# Patient Record
Sex: Male | Born: 1940
Health system: Southern US, Community
[De-identification: ages and names within clinical notes are randomized; demographics above are authoritative.]

## PROBLEM LIST (undated history)

## (undated) DIAGNOSIS — Z8639 Personal history of other endocrine, nutritional and metabolic disease: Secondary | ICD-10-CM

## (undated) DIAGNOSIS — F419 Anxiety disorder, unspecified: Secondary | ICD-10-CM

## (undated) DIAGNOSIS — M1990 Unspecified osteoarthritis, unspecified site: Secondary | ICD-10-CM

## (undated) DIAGNOSIS — N529 Male erectile dysfunction, unspecified: Secondary | ICD-10-CM

## (undated) DIAGNOSIS — I10 Essential (primary) hypertension: Secondary | ICD-10-CM

## (undated) DIAGNOSIS — K219 Gastro-esophageal reflux disease without esophagitis: Secondary | ICD-10-CM

## (undated) DIAGNOSIS — M7500 Adhesive capsulitis of unspecified shoulder: Secondary | ICD-10-CM

## (undated) DIAGNOSIS — M4800 Spinal stenosis, site unspecified: Secondary | ICD-10-CM

## (undated) DIAGNOSIS — I2510 Atherosclerotic heart disease of native coronary artery without angina pectoris: Secondary | ICD-10-CM

## (undated) HISTORY — DX: Adhesive capsulitis of unspecified shoulder: M75.00

## (undated) HISTORY — PX: CARDIAC SURGERY: SHX584

## (undated) HISTORY — DX: Male erectile dysfunction, unspecified: N52.9

## (undated) HISTORY — PX: CORONARY ANGIOPLASTY: SHX604

## (undated) HISTORY — DX: Spinal stenosis, site unspecified: M48.00

## (undated) HISTORY — DX: Unspecified osteoarthritis, unspecified site: M19.90

---

## 2007-12-10 DIAGNOSIS — G610 Guillain-Barre syndrome: Secondary | ICD-10-CM

## 2007-12-10 HISTORY — DX: Guillain-Barre syndrome: G61.0

## 2013-11-01 ENCOUNTER — Emergency Department (HOSPITAL_COMMUNITY)
Admission: EM | Admit: 2013-11-01 | Discharge: 2013-11-01 | Disposition: A | Discharge status: Home / Self Care | Payer: Medicare HMO | Attending: Emergency Medicine | Admitting: Emergency Medicine

## 2013-11-01 ENCOUNTER — Emergency Department (HOSPITAL_COMMUNITY): Payer: Medicare HMO

## 2013-11-01 ENCOUNTER — Encounter (HOSPITAL_COMMUNITY): Payer: Self-pay | Admitting: Emergency Medicine

## 2013-11-01 DIAGNOSIS — Z8639 Personal history of other endocrine, nutritional and metabolic disease: Secondary | ICD-10-CM | POA: Insufficient documentation

## 2013-11-01 DIAGNOSIS — Z862 Personal history of diseases of the blood and blood-forming organs and certain disorders involving the immune mechanism: Secondary | ICD-10-CM | POA: Insufficient documentation

## 2013-11-01 DIAGNOSIS — Z87891 Personal history of nicotine dependence: Secondary | ICD-10-CM | POA: Insufficient documentation

## 2013-11-01 DIAGNOSIS — IMO0002 Reserved for concepts with insufficient information to code with codable children: Secondary | ICD-10-CM | POA: Insufficient documentation

## 2013-11-01 DIAGNOSIS — I10 Essential (primary) hypertension: Secondary | ICD-10-CM | POA: Insufficient documentation

## 2013-11-01 DIAGNOSIS — M5417 Radiculopathy, lumbosacral region: Secondary | ICD-10-CM

## 2013-11-01 DIAGNOSIS — M1990 Unspecified osteoarthritis, unspecified site: Secondary | ICD-10-CM | POA: Insufficient documentation

## 2013-11-01 HISTORY — DX: Personal history of other endocrine, nutritional and metabolic disease: Z86.39

## 2013-11-01 HISTORY — DX: Essential (primary) hypertension: I10

## 2013-11-01 MED ORDER — DIAZEPAM 5 MG PO TABS
5.0000 mg | ORAL_TABLET | Freq: Once | ORAL | Status: AC
  Administered 2013-11-01: 5 mg via ORAL
  Filled 2013-11-01: qty 1

## 2013-11-01 MED ORDER — HYDROCODONE-ACETAMINOPHEN 5-325 MG PO TABS: 1.0000 | ORAL_TABLET | Freq: Four times a day (QID) | ORAL | Status: DC | PRN

## 2013-11-01 MED ORDER — HYDROCODONE-ACETAMINOPHEN 5-325 MG PO TABS
1.0000 | ORAL_TABLET | Freq: Once | ORAL | Status: AC
  Administered 2013-11-01: 1 via ORAL
  Filled 2013-11-01: qty 1

## 2013-11-01 MED ORDER — DIAZEPAM 2 MG PO TABS: 2.0000 mg | ORAL_TABLET | Freq: Three times a day (TID) | ORAL | Status: DC | PRN

## 2013-11-01 MED ORDER — OXYCODONE-ACETAMINOPHEN 5-325 MG PO TABS
1.0000 | ORAL_TABLET | Freq: Once | ORAL | Status: AC
  Administered 2013-11-01: 1 via ORAL
  Filled 2013-11-01: qty 1

## 2013-11-01 NOTE — ED Notes (Addendum)
Per EMS, pt is experiencing left sided hip pain which shoots down left leg to his foot since 00:30 tonight. NAD at this time. No trauma reported

## 2013-11-01 NOTE — ED Provider Notes (Signed)
CSN: [id number]     Arrival date & time 11/01/13  0516 History   First MD Initiated Contact with Patient 11/01/13 0556     Chief Complaint  Patient presents with  . Leg Pain   (Consider location/radiation/quality/duration/timing/severity/associated sxs/prior Treatment) HPI Clinton [name] is a 72 y.o. male who presents to emergency department complaining of left leg pain. Patient states around the night last night he got up out of his bed and to make a midnight snack and states he suddenly turned around while standing and felt sharp pain in his left hip radiating all the way to the top of the left foot. States pain was sharp and throbbing at the same time. Patient states he can move the leg without pain. States he could not walk back to the bed. States his wife brought a walker that she uses to help him get back to the bed. Patient states since then he has been in bed but in severe pain unable to move left leg due to pain. He states that both of his legs feel "slightly tingly" but states that this has been there for several days. Patient denies any falls or any other injuries. He denies any loss of bowel or urinary problems. He denies any fever. Denies prior pain. States he does have history of arthritis and states he has had back problems in the past. Also reports prior left knee problems.  Past Medical History  Diagnosis Date  . Hypertension   . History of elevated lipids    Past Surgical History  Procedure Laterality Date  . Cardiac surgery      2 stents placed in 2000   No family history on file. History  Substance Use Topics  . Smoking status: Former Games developer  . Smokeless tobacco: Not on file  . Alcohol Use: Yes     Comment: occasionally    Review of Systems  Constitutional: Negative for fever and chills.  Respiratory: Negative for cough, chest tightness and shortness of breath.   Cardiovascular: Negative for chest pain, palpitations and leg swelling.  Gastrointestinal: Negative for  nausea, vomiting, abdominal pain, diarrhea and abdominal distention.  Genitourinary: Negative for dysuria, urgency, frequency and hematuria.  Musculoskeletal: Positive for arthralgias, back pain and myalgias. Negative for neck pain and neck stiffness.  Skin: Negative for rash.  Allergic/Immunologic: Negative for immunocompromised state.  Neurological: Negative for dizziness, weakness, light-headedness, numbness and headaches.    Allergies  Review of patient's allergies indicates not on file.  Home Medications  No current outpatient prescriptions on file. BP 183/89  Pulse 62  Temp(Src) 98.8 F (37.1 C) (Oral)  Resp 20  SpO2 96% Physical Exam  Nursing note and vitals reviewed. Constitutional: He appears well-developed and well-nourished. No distress.  HENT:  Head: Normocephalic and atraumatic.  Eyes: Conjunctivae are normal.  Neck: Neck supple.  Cardiovascular: Normal rate, regular rhythm and normal heart sounds.   Dorsal pedal pulses are intact and equal bilaterally  Pulmonary/Chest: Effort normal. No respiratory distress. He has no wheezes. He has no rales.  Abdominal: Soft. Bowel sounds are normal. He exhibits no distension. There is no tenderness. There is no rebound.  Musculoskeletal: He exhibits no edema.  Lumbar spine nontender. Tenderness to palpation over posterior left hip and over greater trochanter. Pain with any ROM of the left hip, any flexion, internal or external rotation. Tenderness to the anterior knee. Pain with Knee ROM. No tenderness to the shin or foot. Pt is able to dorsiflex left ankle and great  toe, but not toes 4-5.   Neurological: He is alert.  Equal sensation to bilateral thighs, lower legs, feet. 5 out of 5 and equal strength of lower extremities bilaterally.  Skin: Skin is warm and dry.    ED Course  Procedures (including critical care time) Labs Review Labs Reviewed - No data to display Imaging Review Dg Lumbar Spine Complete  11/01/2013    CLINICAL DATA:  Back pain.  EXAM: LUMBAR SPINE - COMPLETE 4+ VIEW  COMPARISON:  None.  FINDINGS: Diffuse severe thoracolumbar and lumbosacral degenerative change. Prominent osteophytes and severe diffuse facet hypertrophy present. Approximately 6 mm retrolisthesis of L2 on L3 is present. This is most likely degenerative. Aortoiliac atherosclerotic vascular disease present.  IMPRESSION: 1. Severe degenerative changes lumbar spine. 2. 6 mm retrolisthesis L2 on L3.  This is most likely degenerative.   Electronically Signed   By: Maisie Fus  Register   On: 11/01/2013 07:32   Dg Hip Complete Left  11/01/2013   CLINICAL DATA:  Pain.  EXAM: LEFT HIP - COMPLETE 2+ VIEW  COMPARISON:  None.  FINDINGS: Degenerative changes of the lumbar spine and both hips. No acute abnormality. Phleboliths.  IMPRESSION: Degenerative changes lumbar spine and both hips. No acute abnormality.   Electronically Signed   By: Maisie Fus  Register   On: 11/01/2013 07:30    EKG Interpretation   None       MDM   1. Lumbosacral radiculopathy   2. Degenerative joint disease     Patient with nontraumatic sudden onset of pain 1 from left hip into the left foot. He has history of severe arthritis in most joints. He has history of back problems. He does not have any numbness or weakness in his feet or legs. No loss of bowels or urinary incontinence or retention. He does not have any fever. His pain was treated with Percocet and Valium 5 mg. He sustained significantly improved. He ambulated in the hallway with his cane. X-rays show severe degenerative changes. He has an appointment with orthopedics Dr. in 1 month, instructed to call back and get a closer appointment. Given no signs of cauda equina, patient is ambulatory, he is neurovascularly intact will discharge home with pain management and close followup. Blood pressure is elevated and ED, he did not take his blood pressure medicines this morning at. Instructed to take his medications as soon  as he gets home and have blood pressure rechecked closely by his primary care Dr.  Ceasar Mons Vitals:   11/01/13 0538 11/01/13 0545 11/01/13 0645 11/01/13 0700  BP: 183/89 178/89 164/77 184/85  Pulse: 62 59 59 57  Temp: 98.8 F (37.1 C)     TempSrc: Oral     Resp: 20     SpO2: 96% 96% 97% 96%        Lottie Mussel, PA-C 11/01/13 1607

## 2013-11-01 NOTE — ED Notes (Signed)
Pt ambulated in hallway with cane and standby assist. Pt tolerated well. NAD noted.

## 2013-11-01 NOTE — ED Notes (Signed)
MD at bedside. 

## 2013-11-02 ENCOUNTER — Other Ambulatory Visit: Payer: Self-pay | Admitting: Sports Medicine

## 2013-11-02 DIAGNOSIS — M545 Low back pain, unspecified: Secondary | ICD-10-CM

## 2013-11-02 NOTE — ED Provider Notes (Signed)
  This was a shared visit with a mid-level provided (NP or PA).  Throughout the patient's course I was available for consultation/collaboration.  I saw the ECG (if appropriate), relevant labs and studies - I agree with the interpretation.  On my exam the patient was in no distress.  He had already received initial analgesia.  He improved substantially here, had no evidence of decompensation, was ambulatory, with the right side suggesting neurologic impairment.      Gerhard Munch, MD 11/02/13 [phone]

## 2013-11-16 ENCOUNTER — Other Ambulatory Visit: Payer: Medicare HMO

## 2014-10-19 ENCOUNTER — Other Ambulatory Visit: Payer: Self-pay | Admitting: Orthopedic Surgery

## 2014-11-11 ENCOUNTER — Encounter (HOSPITAL_COMMUNITY)
Admission: RE | Admit: 2014-11-11 | Discharge: 2014-11-11 | Disposition: A | Discharge status: Home / Self Care | Payer: Medicare HMO | Source: Ambulatory Visit | Attending: Orthopedic Surgery | Admitting: Orthopedic Surgery

## 2014-11-11 ENCOUNTER — Encounter (HOSPITAL_COMMUNITY): Payer: Self-pay

## 2014-11-11 DIAGNOSIS — K219 Gastro-esophageal reflux disease without esophagitis: Secondary | ICD-10-CM | POA: Diagnosis not present

## 2014-11-11 DIAGNOSIS — I252 Old myocardial infarction: Secondary | ICD-10-CM | POA: Diagnosis not present

## 2014-11-11 DIAGNOSIS — R001 Bradycardia, unspecified: Secondary | ICD-10-CM | POA: Diagnosis not present

## 2014-11-11 DIAGNOSIS — Z9861 Coronary angioplasty status: Secondary | ICD-10-CM | POA: Insufficient documentation

## 2014-11-11 DIAGNOSIS — F419 Anxiety disorder, unspecified: Secondary | ICD-10-CM | POA: Diagnosis not present

## 2014-11-11 DIAGNOSIS — I10 Essential (primary) hypertension: Secondary | ICD-10-CM | POA: Insufficient documentation

## 2014-11-11 DIAGNOSIS — Z6831 Body mass index (BMI) 31.0-31.9, adult: Secondary | ICD-10-CM | POA: Diagnosis not present

## 2014-11-11 DIAGNOSIS — Z87891 Personal history of nicotine dependence: Secondary | ICD-10-CM | POA: Insufficient documentation

## 2014-11-11 DIAGNOSIS — I2510 Atherosclerotic heart disease of native coronary artery without angina pectoris: Secondary | ICD-10-CM | POA: Insufficient documentation

## 2014-11-11 DIAGNOSIS — M5134 Other intervertebral disc degeneration, thoracic region: Secondary | ICD-10-CM | POA: Insufficient documentation

## 2014-11-11 DIAGNOSIS — Z01818 Encounter for other preprocedural examination: Secondary | ICD-10-CM | POA: Diagnosis present

## 2014-11-11 HISTORY — DX: Anxiety disorder, unspecified: F41.9

## 2014-11-11 HISTORY — DX: Gastro-esophageal reflux disease without esophagitis: K21.9

## 2014-11-11 HISTORY — DX: Atherosclerotic heart disease of native coronary artery without angina pectoris: I25.10

## 2014-11-11 HISTORY — DX: Unspecified osteoarthritis, unspecified site: M19.90

## 2014-11-11 LAB — URINALYSIS, ROUTINE W REFLEX MICROSCOPIC
Glucose, UA: NEGATIVE mg/dL
Hgb urine dipstick: NEGATIVE
Ketones, ur: 15 mg/dL — AB
Leukocytes, UA: NEGATIVE
Nitrite: NEGATIVE
PH: 5.5 (ref 5.0–8.0)
Protein, ur: NEGATIVE mg/dL
SPECIFIC GRAVITY, URINE: 1.029 (ref 1.005–1.030)
UROBILINOGEN UA: 0.2 mg/dL (ref 0.0–1.0)

## 2014-11-11 LAB — TYPE AND SCREEN
ABO/RH(D): O POS
ANTIBODY SCREEN: NEGATIVE

## 2014-11-11 LAB — CBC WITH DIFFERENTIAL/PLATELET
Basophils Absolute: 0 10*3/uL (ref 0.0–0.1)
Basophils Relative: 1 % (ref 0–1)
Eosinophils Absolute: 0.1 10*3/uL (ref 0.0–0.7)
Eosinophils Relative: 2 % (ref 0–5)
HEMATOCRIT: 43.8 % (ref 39.0–52.0)
Hemoglobin: 15 g/dL (ref 13.0–17.0)
Lymphocytes Relative: 16 % (ref 12–46)
Lymphs Abs: 1 10*3/uL (ref 0.7–4.0)
MCH: 31.6 pg (ref 26.0–34.0)
MCHC: 34.2 g/dL (ref 30.0–36.0)
MCV: 92.2 fL (ref 78.0–100.0)
MONOS PCT: 8 % (ref 3–12)
Monocytes Absolute: 0.5 10*3/uL (ref 0.1–1.0)
NEUTROS ABS: 4.9 10*3/uL (ref 1.7–7.7)
Neutrophils Relative %: 73 % (ref 43–77)
Platelets: 189 10*3/uL (ref 150–400)
RBC: 4.75 MIL/uL (ref 4.22–5.81)
RDW: 13.1 % (ref 11.5–15.5)
WBC: 6.6 10*3/uL (ref 4.0–10.5)

## 2014-11-11 LAB — SURGICAL PCR SCREEN
MRSA, PCR: NEGATIVE
STAPHYLOCOCCUS AUREUS: NEGATIVE

## 2014-11-11 LAB — ABO/RH: ABO/RH(D): O POS

## 2014-11-11 LAB — COMPREHENSIVE METABOLIC PANEL
ALBUMIN: 3.8 g/dL (ref 3.5–5.2)
ALK PHOS: 55 U/L (ref 39–117)
ALT: 16 U/L (ref 0–53)
AST: 18 U/L (ref 0–37)
Anion gap: 16 — ABNORMAL HIGH (ref 5–15)
BILIRUBIN TOTAL: 0.5 mg/dL (ref 0.3–1.2)
BUN: 20 mg/dL (ref 6–23)
CHLORIDE: 106 meq/L (ref 96–112)
CO2: 19 meq/L (ref 19–32)
CREATININE: 0.79 mg/dL (ref 0.50–1.35)
Calcium: 9.6 mg/dL (ref 8.4–10.5)
GFR calc Af Amer: >90 mL/min (ref >90)
GFR, EST NON AFRICAN AMERICAN: 88 mL/min — AB (ref >90)
Glucose, Bld: 97 mg/dL (ref 70–99)
POTASSIUM: 4.4 meq/L (ref 3.7–5.3)
Sodium: 141 mEq/L (ref 137–147)
Total Protein: 7.1 g/dL (ref 6.0–8.3)

## 2014-11-11 LAB — PROTIME-INR
INR: 1.05 (ref 0.00–1.49)
PROTHROMBIN TIME: 13.8 s (ref 11.6–15.2)

## 2014-11-11 LAB — APTT: APTT: 34 s (ref 24–37)

## 2014-11-11 NOTE — Pre-Procedure Instructions (Signed)
SALOMON GANSER  11/11/2014   Your procedure is scheduled on:  Thursday November 24, 2014 at 0730 AM  Report to [place] at 0530 AM.  Call this number if you have problems the morning of surgery: [phone]   Remember:   Do not eat food or drink liquids after midnight Wednesday 11/23/14.   Take these medicines the morning of surgery with A SIP OF WATER:  pain med if needed, Metoprolol, and Zantac.  Stop Aspirin 5 days prior to surgery 11/19/14      Do not wear jewelry.  Do not wear lotions, powders, or perfumes. You may not wear deodorant.  Do not shave 48 hours prior to surgery. Men may shave face and neck.  Do not bring valuables to the hospital.  [facility] is not responsible                  for any belongings or valuables.               Contacts, dentures or bridgework may not be worn into surgery.  Leave suitcase in the car. After surgery it may be brought to your room.  For patients admitted to the hospital, discharge time is determined by your                treatment team.               Patients discharged the day of surgery will not be allowed to drive  home.    Special Instructions: [place] - Preparing for Surgery  Before surgery, you can play an important role.  Because skin is not sterile, your skin needs to be as free of germs as possible.  You can reduce the number of germs on you skin by washing with CHG (chlorahexidine gluconate) soap before surgery.  CHG is an antiseptic cleaner which kills germs and bonds with the skin to continue killing germs even after washing.  Please DO NOT use if you have an allergy to CHG or antibacterial soaps.  If your skin becomes reddened/irritated stop using the CHG and inform your nurse when you arrive at Short Stay.  Do not shave (including legs and underarms) for at least 48 hours prior to the first CHG shower.  You may shave your face.  Please follow these instructions carefully:   1.  Shower with  CHG Soap the night before surgery and the                                morning of Surgery.  2.  If you choose to wash your hair, wash your hair first as usual with your       normal shampoo.  3.  After you shampoo, rinse your hair and body thoroughly to remove the                      Shampoo.  4.  Use CHG as you would any other liquid soap.  You can apply chg directly       to the skin and wash gently with scrungie or a clean washcloth.  5.  Apply the CHG Soap to your body ONLY FROM THE NECK DOWN.        Do not use on open wounds or open sores.  Avoid contact with your eyes,       ears, mouth and genitals (  private parts).  Wash genitals (private parts)       with your normal soap.  6.  Wash thoroughly, paying special attention to the area where your surgery        will be performed.  7.  Thoroughly rinse your body with warm water from the neck down.  8.  DO NOT shower/wash with your normal soap after using and rinsing off       the CHG Soap.  9.  Pat yourself dry with a clean towel.            10.  Wear clean pajamas.            11.  Place clean sheets on your bed the night of your first shower and do not        sleep with pets.  Day of Surgery  Do not apply any lotions/deoderants the morning of surgery.  Please wear clean clothes to the hospital/surgery center.      Please read over the following fact sheets that you were given: Pain Booklet, Coughing and Deep Breathing, Blood Transfusion Information, MRSA Information and Surgical Site Infection Prevention

## 2014-11-14 NOTE — Progress Notes (Addendum)
Anesthesia Chart Review:  Pt is 73 year old male scheduled for L4-5 decompression and fusion on 11/24/2014 with Dr. Lynann Bologna.   PMH: HTN, CAD, GERD, anxiety. S/p PCI with stenting x2 in 2000. BMI 31.47. Former smoker.   Preoperative labs reviewed.    Chest x-ray reviewed.  There is no active cardiopulmonary disease. Chronic bronchitic changes especially in the lower lobes are suspected. There are degenerative changes of the lumbar spine and both shoulders.  EKG: sinus bradycardia (52 bpm), no prior tracing to compare.   Attempting to get records of PCI from [facility] from 2000.   Pt is seeing Dr. Acie Fredrickson 12/8 for preop eval. Will await his input.   Willeen Cass, FNP-BC [facility]/Anesthesiology Phone: [phone] 11/14/2014 3:00 PM  Addendum: Records received from [facility].  Cardiologist in  2000 was Dr. Nikki Dom. He underwent stenting of his mid and proximal LAD following NSTEMI on 09/28/1999.   The last cardiac cath received was from 10/01/2001 and showed:  LEFT VENTRICULOGRAPHY: LV systolic function in the RAO projection was normal.  There was very mild anterior hypokinesis. No MR. LVEF 55-60%.  LEFT MAIN: LM was medium in caliber and bifurcated into the CX and LAD. Angiographically unremarkable. LAD: LAD was medium in caliber. Angiographically unremarkable. The previous placed stents were widely patent. LCX: LCX was large in caliber. 40% focal mid stenosis. RCA: RCA was large in caliber. Dominant vessel. Angiographically unremarkable.  RECOMMENDATIONS: Medical therapy.  Patient was seen by cardiologist Dr. Acie Fredrickson earlier today for preoperative evaluation.  His note states, "He is stable for his back surgery and is at low-moderate risk for his back surgery." One year follow-up recommended.  If no acute changes then plan to proceed.  George Hugh [facility] Short Stay Center/Anesthesiology Phone [phone] 11/15/2014 4:40  PM

## 2014-11-15 ENCOUNTER — Encounter: Payer: Self-pay | Admitting: Cardiovascular Disease

## 2014-11-15 ENCOUNTER — Encounter (HOSPITAL_COMMUNITY): Payer: Self-pay

## 2014-11-15 ENCOUNTER — Ambulatory Visit (INDEPENDENT_AMBULATORY_CARE_PROVIDER_SITE_OTHER): Payer: Medicare HMO | Admitting: Cardiovascular Disease

## 2014-11-15 VITALS — BP 128/88 | HR 55 | Ht 70.0 in | Wt 217.0 lb

## 2014-11-15 DIAGNOSIS — I10 Essential (primary) hypertension: Secondary | ICD-10-CM

## 2014-11-15 DIAGNOSIS — I2510 Atherosclerotic heart disease of native coronary artery without angina pectoris: Secondary | ICD-10-CM

## 2014-11-15 DIAGNOSIS — E785 Hyperlipidemia, unspecified: Secondary | ICD-10-CM | POA: Insufficient documentation

## 2014-11-15 MED ORDER — ASPIRIN EC 81 MG PO TBEC: 81.0000 mg | DELAYED_RELEASE_TABLET | Freq: Every day | ORAL | Status: DC

## 2014-11-15 NOTE — Assessment & Plan Note (Signed)
Clinton [name] is doing well.  He has a hx of CAD in 2000 but has not had any problems since that time He has not had any angina  He is stable for his back surgery and is at low-moderate risk for his back surgery.  He may return again in 1 year.

## 2014-11-15 NOTE — Progress Notes (Signed)
Clinton [name] Date of Birth  12-08-1941       [place] [address], [place], [place]  [address]   [place], [place]  [address] [address]   Fax  [phone]     Fax [phone]  Problem List: 1. Coronary artery disease-he just coronary stenting in 2000 when he lived in [place]. 2. Hypertension 3. Hyperlipidemia 4. Back pain  History of Present Illness:  Clinton [name] is a 73 yo with hx of CAD - s/p 2 stents at [place] in 2000.  He has had no symptoms since that time. Moved to [place] a year ago.  No cp, no dyspnea  .  Walking is a challenge - uses a walker.  I advised him that he could cut his ASA to 38 - he wants to continue the regular sized ASA but he agreed to take the smaller one.   He is retired in the [employer].    Current Outpatient Prescriptions on File Prior to Visit  Medication Sig Dispense Refill  . aspirin EC 325 MG tablet Take 325 mg by mouth daily.    . metoprolol (LOPRESSOR) 50 MG tablet Take 50-100 mg by mouth See admin instructions. Takes 2 tablets in the morning, and 1 tablet in the evening    . ranitidine (ZANTAC) 150 MG tablet Take 150 mg by mouth daily as needed for heartburn.    . traMADol (ULTRAM) 50 MG tablet   1   No current facility-administered medications on file prior to visit.    No Known Allergies  Past Medical History  Diagnosis Date  . Hypertension   . History of elevated lipids   . Coronary artery disease     have an appointment with Dr Johnsie Cancel 11/15/14  . Anxiety   . GERD (gastroesophageal reflux disease)   . Arthritis     Past Surgical History  Procedure Laterality Date  . Cardiac surgery      2 stents placed in 2000  . Coronary angioplasty      mid and proximal LAD stent 09/1999 [facility])    History  Smoking status  . Former Smoker -- 0.50 packs/day for 10 years  . Types: Cigarettes  . Quit date:  11/11/1974  Smokeless tobacco  . Never Used    Comment: age 10 yrs    History  Alcohol Use  . Yes    Comment: occasionally    No family history on file.  Reviw of Systems:  Reviewed in the HPI.  All other systems are negative.  Physical Exam: Blood pressure 128/88, pulse 55, height 5\' 10"  (1.778 m), weight 217 lb (98.431 kg). Wt Readings from Last 3 Encounters:  11/15/14 217 lb (98.431 kg)  11/11/14 219 lb 5.7 oz (99.499 kg)     General: Well developed, well nourished, in no acute distress.  Head: Normocephalic, atraumatic, sclera non-icteric, mucus membranes are moist,   Neck: Supple. Carotids are 2 + without bruits. No JVD   Lungs: Clear   Heart: RR, normal s1s2  Abdomen: Soft, non-tender, non-distended with normal bowel sounds.  Msk:  Strength and tone are normal   Extremities: No clubbing or cyanosis. No edema.  Distal pedal pulses are 2+ and equal    Neuro: CN II - XII intact.  Alert and oriented X 3.   Psych:  Normal   ECG: Dec. 4,  2015:  NSR, no St or T wave changes   Assessment / Plan:

## 2014-11-15 NOTE — Assessment & Plan Note (Signed)
Continue curent medical regimin. Will see him in 1 year

## 2014-11-15 NOTE — Patient Instructions (Signed)
Your physician recommends that you continue on your current medications as directed. Please refer to the Current Medication list given to you today.  Your physician wants you to follow-up in: 1 year with Dr. Acie Fredrickson.  You will receive a reminder letter in the mail two months in advance. If you don't receive a letter, please call our office to schedule the follow-up appointment.  Your physician has cleared you from a cardiac standpoint for your surgery

## 2014-11-23 MED ORDER — POVIDONE-IODINE 7.5 % EX SOLN
Freq: Once | CUTANEOUS | Status: DC
  Filled 2014-11-23: qty 118

## 2014-11-23 MED ORDER — CEFAZOLIN SODIUM-DEXTROSE 2-3 GM-% IV SOLR
2.0000 g | INTRAVENOUS | Status: AC
  Administered 2014-11-24 (×2): 2 g via INTRAVENOUS
  Filled 2014-11-23: qty 50

## 2014-11-23 NOTE — H&P (Signed)
     PREOPERATIVE H&P  Chief Complaint: bilateral lg pain  HPI: Clinton [name] is a 73 y.o. male who presents with ongoing pain in the bilateral legs x 1 year  MRI reveals severe stenosis at L4/5 and radiographs reveal instability at L4/5  Patient has failed multiple forms of conservative care including ESIs, and continues to have pain (see office notes for additional details regarding the patient's full course of treatment)  Past Medical History  Diagnosis Date  . Hypertension   . History of elevated lipids   . Coronary artery disease     have an appointment with Dr Johnsie Cancel 11/15/14  . Anxiety   . GERD (gastroesophageal reflux disease)   . Arthritis    Past Surgical History  Procedure Laterality Date  . Cardiac surgery      2 stents placed in 2000  . Coronary angioplasty      mid and proximal LAD stent 09/1999 [facility])   History   Social History  . Marital Status: Married    Spouse Name: N/A    Number of Children: N/A  . Years of Education: N/A   Social History Main Topics  . Smoking status: Former Smoker -- 0.50 packs/day for 10 years    Types: Cigarettes    Quit date: 11/11/1974  . Smokeless tobacco: Never Used     Comment: age 40 yrs  . Alcohol Use: Yes     Comment: occasionally  . Drug Use: No  . Sexual Activity: Not on file   Other Topics Concern  . Not on file   Social History Narrative   No family history on file. No Known Allergies Prior to Admission medications   Medication Sig Start Date End Date Taking? Authorizing Provider  acetaminophen (TYLENOL) 500 MG tablet Take 500 mg by mouth every 6 (six) hours as needed.    Historical Provider, MD  aspirin EC 81 MG tablet Take 1 tablet (81 mg total) by mouth daily. 11/15/14   Ramond Dial, MD  calcium carbonate (TUMS EX) 750 MG chewable tablet Chew 1 tablet by mouth daily as needed for heartburn.    Historical Provider, MD  metoprolol (LOPRESSOR) 50 MG tablet Take 50-100 mg by mouth See  admin instructions. Takes 2 tablets in the morning, and 1 tablet in the evening    Historical Provider, MD  ranitidine (ZANTAC) 150 MG tablet Take 150 mg by mouth daily as needed for heartburn.    Historical Provider, MD  traMADol Veatrice Bourbon) 50 MG tablet  10/11/14   Historical Provider, MD     All other systems have been reviewed and were otherwise negative with the exception of those mentioned in the HPI and as above.  Physical Exam: There were no vitals filed for this visit.  General: Alert, no acute distress Cardiovascular: No pedal edema Respiratory: No cyanosis, no use of accessory musculature Skin: No lesions in the area of chief complaint Neurologic: Sensation intact distally Psychiatric: Patient is competent for consent with normal mood and affect Lymphatic: No axillary or cervical lymphadenopathy   Assessment/Plan: Bilateral leg pain Plan for Procedure(s): POSTERIOR LUMBAR DECOMPRESSION AND FUSION 1 LEVEL   Sinclair Ship, MD 11/23/2014 12:46 PM

## 2014-11-24 ENCOUNTER — Encounter (HOSPITAL_COMMUNITY): Payer: Self-pay | Admitting: *Deleted

## 2014-11-24 ENCOUNTER — Inpatient Hospital Stay (HOSPITAL_COMMUNITY): Payer: Medicare HMO

## 2014-11-24 ENCOUNTER — Encounter (HOSPITAL_COMMUNITY)
Admission: RE | Disposition: A | Discharge status: Skilled Nursing Facility | Payer: Medicare HMO | Source: Ambulatory Visit | Attending: Orthopedic Surgery

## 2014-11-24 ENCOUNTER — Inpatient Hospital Stay (HOSPITAL_COMMUNITY): Payer: Medicare HMO | Admitting: Emergency Medicine

## 2014-11-24 ENCOUNTER — Inpatient Hospital Stay (HOSPITAL_COMMUNITY)
Admission: RE | Admit: 2014-11-24 | Discharge: 2014-11-28 | DRG: 460 | Disposition: A | Discharge status: Skilled Nursing Facility | Payer: Medicare HMO | Source: Ambulatory Visit | Attending: Orthopedic Surgery | Admitting: Orthopedic Surgery

## 2014-11-24 ENCOUNTER — Inpatient Hospital Stay (HOSPITAL_COMMUNITY): Payer: Medicare HMO | Admitting: Anesthesiology

## 2014-11-24 DIAGNOSIS — M62830 Muscle spasm of back: Secondary | ICD-10-CM | POA: Diagnosis not present

## 2014-11-24 DIAGNOSIS — Z87891 Personal history of nicotine dependence: Secondary | ICD-10-CM | POA: Diagnosis not present

## 2014-11-24 DIAGNOSIS — K219 Gastro-esophageal reflux disease without esophagitis: Secondary | ICD-10-CM | POA: Diagnosis present

## 2014-11-24 DIAGNOSIS — M79605 Pain in left leg: Secondary | ICD-10-CM

## 2014-11-24 DIAGNOSIS — M4316 Spondylolisthesis, lumbar region: Secondary | ICD-10-CM | POA: Diagnosis present

## 2014-11-24 DIAGNOSIS — M4800 Spinal stenosis, site unspecified: Secondary | ICD-10-CM

## 2014-11-24 DIAGNOSIS — I10 Essential (primary) hypertension: Secondary | ICD-10-CM | POA: Diagnosis present

## 2014-11-24 DIAGNOSIS — M795 Residual foreign body in soft tissue: Secondary | ICD-10-CM

## 2014-11-24 DIAGNOSIS — I2510 Atherosclerotic heart disease of native coronary artery without angina pectoris: Secondary | ICD-10-CM | POA: Diagnosis present

## 2014-11-24 DIAGNOSIS — M79604 Pain in right leg: Secondary | ICD-10-CM

## 2014-11-24 DIAGNOSIS — M4806 Spinal stenosis, lumbar region: Principal | ICD-10-CM | POA: Diagnosis present

## 2014-11-24 DIAGNOSIS — M79606 Pain in leg, unspecified: Secondary | ICD-10-CM | POA: Diagnosis present

## 2014-11-24 DIAGNOSIS — F419 Anxiety disorder, unspecified: Secondary | ICD-10-CM | POA: Diagnosis present

## 2014-11-24 DIAGNOSIS — Z955 Presence of coronary angioplasty implant and graft: Secondary | ICD-10-CM

## 2014-11-24 HISTORY — PX: OTHER SURGICAL HISTORY: SHX169

## 2014-11-24 HISTORY — DX: Spinal stenosis, site unspecified: M48.00

## 2014-11-24 SURGERY — POSTERIOR LUMBAR FUSION 1 LEVEL
Anesthesia: General

## 2014-11-24 MED ORDER — FENTANYL CITRATE 0.05 MG/ML IJ SOLN
INTRAMUSCULAR | Status: DC | PRN
  Administered 2014-11-24 (×3): 50 ug via INTRAVENOUS
  Administered 2014-11-24: 150 ug via INTRAVENOUS
  Administered 2014-11-24 (×2): 50 ug via INTRAVENOUS

## 2014-11-24 MED ORDER — ONDANSETRON HCL 4 MG/2ML IJ SOLN
INTRAMUSCULAR | Status: AC
Start: 2014-11-24 — End: 2014-11-24
  Filled 2014-11-24: qty 2

## 2014-11-24 MED ORDER — THROMBIN 20000 UNITS EX SOLR
CUTANEOUS | Status: DC | PRN
  Administered 2014-11-24: 20000 mL via TOPICAL

## 2014-11-24 MED ORDER — DIPHENHYDRAMINE HCL 12.5 MG/5ML PO ELIX: 12.5000 mg | ORAL_SOLUTION | Freq: Four times a day (QID) | ORAL | Status: DC | PRN

## 2014-11-24 MED ORDER — LACTATED RINGERS IV SOLN
INTRAVENOUS | Status: DC | PRN
  Administered 2014-11-24 (×3): via INTRAVENOUS

## 2014-11-24 MED ORDER — METHYLENE BLUE 1 % INJ SOLN
INTRAMUSCULAR | Status: DC | PRN
  Administered 2014-11-24: .5 mL via SUBMUCOSAL

## 2014-11-24 MED ORDER — ONDANSETRON HCL 4 MG/2ML IJ SOLN: 4.0000 mg | INTRAMUSCULAR | Status: DC | PRN

## 2014-11-24 MED ORDER — OXYCODONE-ACETAMINOPHEN 5-325 MG PO TABS
1.0000 | ORAL_TABLET | ORAL | Status: DC | PRN
  Administered 2014-11-25: 1 via ORAL
  Administered 2014-11-25: 2 via ORAL
  Administered 2014-11-25: 1 via ORAL
  Administered 2014-11-26 – 2014-11-28 (×8): 2 via ORAL
  Filled 2014-11-24 (×4): qty 2
  Filled 2014-11-24: qty 1
  Filled 2014-11-24 (×6): qty 2

## 2014-11-24 MED ORDER — DIAZEPAM 5 MG PO TABS
5.0000 mg | ORAL_TABLET | Freq: Four times a day (QID) | ORAL | Status: DC | PRN
Start: 2014-11-24 — End: 2014-11-28
  Administered 2014-11-24 – 2014-11-28 (×8): 5 mg via ORAL
  Filled 2014-11-24 (×8): qty 1

## 2014-11-24 MED ORDER — LIDOCAINE HCL (CARDIAC) 20 MG/ML IV SOLN
INTRAVENOUS | Status: DC | PRN
  Administered 2014-11-24: 100 mg via INTRAVENOUS

## 2014-11-24 MED ORDER — HYDROMORPHONE HCL 1 MG/ML IJ SOLN
0.2500 mg | INTRAMUSCULAR | Status: DC | PRN
  Administered 2014-11-24: 0.5 mg via INTRAVENOUS

## 2014-11-24 MED ORDER — SODIUM CHLORIDE 0.9 % IV SOLN
INTRAVENOUS | Status: DC
  Administered 2014-11-24 – 2014-11-25 (×2): via INTRAVENOUS

## 2014-11-24 MED ORDER — METOPROLOL TARTRATE 50 MG PO TABS
50.0000 mg | ORAL_TABLET | Freq: Every day | ORAL | Status: DC
  Administered 2014-11-24: 50 mg via ORAL
  Filled 2014-11-24 (×2): qty 1

## 2014-11-24 MED ORDER — CALCIUM CARBONATE ANTACID 750 MG PO CHEW: 1.0000 | CHEWABLE_TABLET | Freq: Every day | ORAL | Status: DC | PRN

## 2014-11-24 MED ORDER — ROCURONIUM BROMIDE 50 MG/5ML IV SOLN
INTRAVENOUS | Status: AC
  Filled 2014-11-24: qty 1

## 2014-11-24 MED ORDER — SODIUM CHLORIDE 0.9 % IJ SOLN
3.0000 mL | Freq: Two times a day (BID) | INTRAMUSCULAR | Status: DC
  Administered 2014-11-25 – 2014-11-27 (×5): 3 mL via INTRAVENOUS

## 2014-11-24 MED ORDER — EPHEDRINE SULFATE 50 MG/ML IJ SOLN
INTRAMUSCULAR | Status: DC | PRN
  Administered 2014-11-24: 10 mg via INTRAVENOUS
  Administered 2014-11-24: 20 mg via INTRAVENOUS

## 2014-11-24 MED ORDER — ONDANSETRON HCL 4 MG/2ML IJ SOLN
INTRAMUSCULAR | Status: DC | PRN
  Administered 2014-11-24: 4 mg via INTRAVENOUS

## 2014-11-24 MED ORDER — PHENOL 1.4 % MT LIQD
1.0000 | OROMUCOSAL | Status: DC | PRN
Start: 2014-11-24 — End: 2014-11-28

## 2014-11-24 MED ORDER — SUCCINYLCHOLINE CHLORIDE 20 MG/ML IJ SOLN
INTRAMUSCULAR | Status: AC
  Filled 2014-11-24: qty 1

## 2014-11-24 MED ORDER — ACETAMINOPHEN 650 MG RE SUPP: 650.0000 mg | RECTAL | Status: DC | PRN

## 2014-11-24 MED ORDER — GLYCOPYRROLATE 0.2 MG/ML IJ SOLN
INTRAMUSCULAR | Status: AC
  Filled 2014-11-24: qty 2

## 2014-11-24 MED ORDER — METOPROLOL TARTRATE 100 MG PO TABS
100.0000 mg | ORAL_TABLET | Freq: Every day | ORAL | Status: DC
  Filled 2014-11-24: qty 1

## 2014-11-24 MED ORDER — SODIUM CHLORIDE 0.9 % IJ SOLN
3.0000 mL | INTRAMUSCULAR | Status: DC | PRN
  Administered 2014-11-27 (×2): 3 mL via INTRAVENOUS
  Filled 2014-11-24 (×2): qty 3

## 2014-11-24 MED ORDER — FENTANYL CITRATE 0.05 MG/ML IJ SOLN
INTRAMUSCULAR | Status: AC
  Filled 2014-11-24: qty 5

## 2014-11-24 MED ORDER — SODIUM CHLORIDE 0.9 % IV SOLN: 250.0000 mL | INTRAVENOUS | Status: DC

## 2014-11-24 MED ORDER — THROMBIN 20000 UNITS EX KIT: PACK | CUTANEOUS | Status: DC | PRN

## 2014-11-24 MED ORDER — GLYCOPYRROLATE 0.2 MG/ML IJ SOLN
INTRAMUSCULAR | Status: DC | PRN
  Administered 2014-11-24: 0.4 mg via INTRAVENOUS
  Administered 2014-11-24: 0.2 mg via INTRAVENOUS
  Administered 2014-11-24: 0.4 mg via INTRAVENOUS

## 2014-11-24 MED ORDER — THROMBIN 20000 UNITS EX SOLR
CUTANEOUS | Status: AC
  Filled 2014-11-24: qty 40000

## 2014-11-24 MED ORDER — PROPOFOL INFUSION 10 MG/ML OPTIME
INTRAVENOUS | Status: DC | PRN
  Administered 2014-11-24: 50 ug/kg/min via INTRAVENOUS

## 2014-11-24 MED ORDER — GLYCOPYRROLATE 0.2 MG/ML IJ SOLN
INTRAMUSCULAR | Status: AC
  Filled 2014-11-24: qty 1

## 2014-11-24 MED ORDER — ALUM & MAG HYDROXIDE-SIMETH 200-200-20 MG/5ML PO SUSP
30.0000 mL | Freq: Four times a day (QID) | ORAL | Status: DC | PRN
  Administered 2014-11-24 – 2014-11-25 (×2): 30 mL via ORAL
  Filled 2014-11-24 (×2): qty 30

## 2014-11-24 MED ORDER — MORPHINE SULFATE (PF) 1 MG/ML IV SOLN: INTRAVENOUS | Status: DC

## 2014-11-24 MED ORDER — DOCUSATE SODIUM 100 MG PO CAPS
100.0000 mg | ORAL_CAPSULE | Freq: Two times a day (BID) | ORAL | Status: DC
  Administered 2014-11-24 – 2014-11-28 (×9): 100 mg via ORAL
  Filled 2014-11-24 (×8): qty 1

## 2014-11-24 MED ORDER — PROPOFOL 10 MG/ML IV EMUL
INTRAVENOUS | Status: AC
  Filled 2014-11-24: qty 100

## 2014-11-24 MED ORDER — NALOXONE HCL 0.4 MG/ML IJ SOLN: 0.4000 mg | INTRAMUSCULAR | Status: DC | PRN

## 2014-11-24 MED ORDER — DIPHENHYDRAMINE HCL 50 MG/ML IJ SOLN: 12.5000 mg | Freq: Four times a day (QID) | INTRAMUSCULAR | Status: DC | PRN

## 2014-11-24 MED ORDER — BUPIVACAINE-EPINEPHRINE 0.25% -1:200000 IJ SOLN
INTRAMUSCULAR | Status: DC | PRN
  Administered 2014-11-24: 4 mL

## 2014-11-24 MED ORDER — METOPROLOL TARTRATE 50 MG PO TABS: 50.0000 mg | ORAL_TABLET | ORAL | Status: DC

## 2014-11-24 MED ORDER — BUPIVACAINE-EPINEPHRINE (PF) 0.25% -1:200000 IJ SOLN
INTRAMUSCULAR | Status: AC
  Filled 2014-11-24: qty 30

## 2014-11-24 MED ORDER — ONDANSETRON HCL 4 MG/2ML IJ SOLN: 4.0000 mg | Freq: Once | INTRAMUSCULAR | Status: DC | PRN

## 2014-11-24 MED ORDER — BISACODYL 5 MG PO TBEC
5.0000 mg | DELAYED_RELEASE_TABLET | Freq: Every day | ORAL | Status: DC | PRN
  Administered 2014-11-25: 5 mg via ORAL
  Filled 2014-11-24: qty 1

## 2014-11-24 MED ORDER — MIDAZOLAM HCL 2 MG/2ML IJ SOLN
INTRAMUSCULAR | Status: AC
  Filled 2014-11-24: qty 2

## 2014-11-24 MED ORDER — PROPOFOL 10 MG/ML IV BOLUS
INTRAVENOUS | Status: AC
  Filled 2014-11-24: qty 20

## 2014-11-24 MED ORDER — SODIUM CHLORIDE 0.9 % IJ SOLN
9.0000 mL | INTRAMUSCULAR | Status: DC | PRN
Start: 2014-11-24 — End: 2014-11-25

## 2014-11-24 MED ORDER — ZOLPIDEM TARTRATE 5 MG PO TABS
5.0000 mg | ORAL_TABLET | Freq: Every evening | ORAL | Status: DC | PRN
  Filled 2014-11-24: qty 1

## 2014-11-24 MED ORDER — THROMBIN 20000 UNITS EX SOLR: CUTANEOUS | Status: DC | PRN

## 2014-11-24 MED ORDER — SENNOSIDES-DOCUSATE SODIUM 8.6-50 MG PO TABS: 1.0000 | ORAL_TABLET | Freq: Every evening | ORAL | Status: DC | PRN

## 2014-11-24 MED ORDER — 0.9 % SODIUM CHLORIDE (POUR BTL) OPTIME
TOPICAL | Status: DC | PRN
  Administered 2014-11-24 (×4): 1000 mL

## 2014-11-24 MED ORDER — LIDOCAINE HCL (CARDIAC) 20 MG/ML IV SOLN
INTRAVENOUS | Status: AC
  Filled 2014-11-24: qty 5

## 2014-11-24 MED ORDER — ROCURONIUM BROMIDE 100 MG/10ML IV SOLN
INTRAVENOUS | Status: DC | PRN
  Administered 2014-11-24 (×2): 20 mg via INTRAVENOUS

## 2014-11-24 MED ORDER — PROPOFOL 10 MG/ML IV BOLUS
INTRAVENOUS | Status: DC | PRN
  Administered 2014-11-24: 200 mg via INTRAVENOUS

## 2014-11-24 MED ORDER — MENTHOL 3 MG MT LOZG
1.0000 | LOZENGE | OROMUCOSAL | Status: DC | PRN
  Administered 2014-11-25: 3 mg via ORAL
  Filled 2014-11-24 (×2): qty 9

## 2014-11-24 MED ORDER — ACETAMINOPHEN 325 MG PO TABS: 650.0000 mg | ORAL_TABLET | ORAL | Status: DC | PRN

## 2014-11-24 MED ORDER — FAMOTIDINE 20 MG PO TABS
20.0000 mg | ORAL_TABLET | Freq: Two times a day (BID) | ORAL | Status: DC
  Administered 2014-11-24 – 2014-11-28 (×6): 20 mg via ORAL
  Filled 2014-11-24 (×10): qty 1

## 2014-11-24 MED ORDER — ONDANSETRON HCL 4 MG/2ML IJ SOLN
4.0000 mg | Freq: Four times a day (QID) | INTRAMUSCULAR | Status: DC | PRN
  Filled 2014-11-24: qty 2

## 2014-11-24 MED ORDER — METHYLENE BLUE 1 % INJ SOLN
INTRAMUSCULAR | Status: AC
  Filled 2014-11-24: qty 10

## 2014-11-24 MED ORDER — SUCCINYLCHOLINE CHLORIDE 20 MG/ML IJ SOLN
INTRAMUSCULAR | Status: DC | PRN
  Administered 2014-11-24: 100 mg via INTRAVENOUS

## 2014-11-24 MED ORDER — CEFAZOLIN SODIUM 1-5 GM-% IV SOLN
1.0000 g | Freq: Three times a day (TID) | INTRAVENOUS | Status: AC
  Administered 2014-11-24 – 2014-11-25 (×2): 1 g via INTRAVENOUS
  Filled 2014-11-24 (×2): qty 50

## 2014-11-24 MED ORDER — PHENYLEPHRINE HCL 10 MG/ML IJ SOLN
10.0000 mg | INTRAVENOUS | Status: DC | PRN
  Administered 2014-11-24: 50 ug/min via INTRAVENOUS

## 2014-11-24 MED ORDER — CALCIUM CARBONATE ANTACID 500 MG PO CHEW
1.0000 | CHEWABLE_TABLET | Freq: Every day | ORAL | Status: DC | PRN
  Administered 2014-11-25: 200 mg via ORAL
  Filled 2014-11-24 (×2): qty 1

## 2014-11-24 MED ORDER — MORPHINE SULFATE 2 MG/ML IJ SOLN
1.0000 mg | INTRAMUSCULAR | Status: DC | PRN
  Administered 2014-11-24 (×3): 2 mg via INTRAVENOUS
  Administered 2014-11-25 (×3): 4 mg via INTRAVENOUS
  Filled 2014-11-24 (×2): qty 2
  Filled 2014-11-24 (×2): qty 1
  Filled 2014-11-24: qty 2
  Filled 2014-11-24: qty 1

## 2014-11-24 MED ORDER — NEOSTIGMINE METHYLSULFATE 10 MG/10ML IV SOLN
INTRAVENOUS | Status: DC | PRN
Start: 2014-11-24 — End: 2014-11-24
  Administered 2014-11-24: 3 mg via INTRAVENOUS

## 2014-11-24 MED ORDER — FLEET ENEMA 7-19 GM/118ML RE ENEM: 1.0000 | ENEMA | Freq: Once | RECTAL | Status: AC | PRN

## 2014-11-24 MED ORDER — CEFAZOLIN SODIUM-DEXTROSE 2-3 GM-% IV SOLR
INTRAVENOUS | Status: AC
  Filled 2014-11-24: qty 50

## 2014-11-24 MED ORDER — HYDROMORPHONE HCL 1 MG/ML IJ SOLN
INTRAMUSCULAR | Status: AC
  Filled 2014-11-24: qty 1

## 2014-11-24 MED FILL — Heparin Sodium (Porcine) Inj 1000 Unit/ML: INTRAMUSCULAR | Qty: 30 | Status: AC

## 2014-11-24 MED FILL — Sodium Chloride IV Soln 0.9%: INTRAVENOUS | Qty: 1000 | Status: AC

## 2014-11-24 MED FILL — Sodium Chloride Irrigation Soln 0.9%: Qty: 3000 | Status: AC

## 2014-11-24 SURGICAL SUPPLY — 85 items
BENZOIN TINCTURE PRP APPL 2/3 (GAUZE/BANDAGES/DRESSINGS) ×2 IMPLANT
BLADE SURG ROTATE 9660 (MISCELLANEOUS) IMPLANT
BUR ROUND PRECISION 4.0 (BURR) ×2 IMPLANT
CARTRIDGE OIL MAESTRO DRILL (MISCELLANEOUS) ×1 IMPLANT
CLSR STERI-STRIP ANTIMIC 1/2X4 (GAUZE/BANDAGES/DRESSINGS) ×4 IMPLANT
CONT SPEC STER OR (MISCELLANEOUS) ×2 IMPLANT
CORDS BIPOLAR (ELECTRODE) ×2 IMPLANT
COVER MAYO STAND STRL (DRAPES) ×4 IMPLANT
COVER SURGICAL LIGHT HANDLE (MISCELLANEOUS) ×2 IMPLANT
DIFFUSER DRILL AIR PNEUMATIC (MISCELLANEOUS) ×2 IMPLANT
DRAIN CHANNEL 15F RND FF W/TCR (WOUND CARE) ×2 IMPLANT
DRAPE C-ARM 42X72 X-RAY (DRAPES) ×2 IMPLANT
DRAPE ORTHO SPLIT 77X108 STRL (DRAPES) ×1
DRAPE POUCH INSTRU U-SHP 10X18 (DRAPES) ×2 IMPLANT
DRAPE SURG 17X23 STRL (DRAPES) ×6 IMPLANT
DRAPE SURG ORHT 6 SPLT 77X108 (DRAPES) ×1 IMPLANT
DURAPREP 26ML APPLICATOR (WOUND CARE) ×2 IMPLANT
ELECT BLADE 4.0 EZ CLEAN MEGAD (MISCELLANEOUS) ×2
ELECT CAUTERY BLADE 6.4 (BLADE) ×2 IMPLANT
ELECT REM PT RETURN 9FT ADLT (ELECTROSURGICAL) ×2
ELECTRODE BLDE 4.0 EZ CLN MEGD (MISCELLANEOUS) ×1 IMPLANT
ELECTRODE REM PT RTRN 9FT ADLT (ELECTROSURGICAL) ×1 IMPLANT
EVACUATOR SILICONE 100CC (DRAIN) ×2 IMPLANT
GAUZE SPONGE 4X4 12PLY STRL (GAUZE/BANDAGES/DRESSINGS) ×2 IMPLANT
GAUZE SPONGE 4X4 16PLY XRAY LF (GAUZE/BANDAGES/DRESSINGS) ×10 IMPLANT
GLOVE BIO SURGEON STRL SZ7 (GLOVE) ×6 IMPLANT
GLOVE BIO SURGEON STRL SZ8 (GLOVE) ×2 IMPLANT
GLOVE BIOGEL PI IND STRL 7.0 (GLOVE) ×2 IMPLANT
GLOVE BIOGEL PI IND STRL 8 (GLOVE) ×1 IMPLANT
GLOVE BIOGEL PI INDICATOR 7.0 (GLOVE) ×2
GLOVE BIOGEL PI INDICATOR 8 (GLOVE) ×1
GLOVE ECLIPSE 6.5 STRL STRAW (GLOVE) ×4 IMPLANT
GOWN STRL REUS W/ TWL LRG LVL3 (GOWN DISPOSABLE) ×2 IMPLANT
GOWN STRL REUS W/ TWL XL LVL3 (GOWN DISPOSABLE) ×1 IMPLANT
GOWN STRL REUS W/TWL LRG LVL3 (GOWN DISPOSABLE) ×2
GOWN STRL REUS W/TWL XL LVL3 (GOWN DISPOSABLE) ×1
IV CATH 14GX2 1/4 (CATHETERS) ×2 IMPLANT
KIT BASIN OR (CUSTOM PROCEDURE TRAY) ×2 IMPLANT
KIT POSITION SURG JACKSON T1 (MISCELLANEOUS) ×2 IMPLANT
KIT ROOM TURNOVER OR (KITS) ×2 IMPLANT
MARKER SKIN DUAL TIP RULER LAB (MISCELLANEOUS) ×2 IMPLANT
NDL SAFETY ECLIPSE 18X1.5 (NEEDLE) ×1 IMPLANT
NEEDLE 22X1 1/2 (OR ONLY) (NEEDLE) ×2 IMPLANT
NEEDLE ASP BONE MRW 11GX15 (NEEDLE) ×2 IMPLANT
NEEDLE BONE MARROW 8GX6 FENEST (NEEDLE) IMPLANT
NEEDLE HYPO 18GX1.5 SHARP (NEEDLE) ×1
NEEDLE HYPO 25GX1X1/2 BEV (NEEDLE) ×2 IMPLANT
NEEDLE SPNL 18GX3.5 QUINCKE PK (NEEDLE) ×4 IMPLANT
NEURO MONITORING STIM (LABOR (TRAVEL & OVERTIME)) ×2 IMPLANT
NS IRRIG 1000ML POUR BTL (IV SOLUTION) ×10 IMPLANT
OIL CARTRIDGE MAESTRO DRILL (MISCELLANEOUS) ×2
PACK LAMINECTOMY ORTHO (CUSTOM PROCEDURE TRAY) ×2 IMPLANT
PACK UNIVERSAL I (CUSTOM PROCEDURE TRAY) ×2 IMPLANT
PACK VITOSS BIOACTIVE 10CC (Neuro Prosthesis/Implant) ×2 IMPLANT
PAD ARMBOARD 7.5X6 YLW CONV (MISCELLANEOUS) ×4 IMPLANT
PATTIES SURGICAL .5 X1 (DISPOSABLE) ×2 IMPLANT
PATTIES SURGICAL .5X1.5 (GAUZE/BANDAGES/DRESSINGS) ×2 IMPLANT
ROD PRE BENT EXP 40MM (Rod) ×2 IMPLANT
ROD PRE BENT EXPEDIUM 35MM (Rod) ×2 IMPLANT
SCREW POLYAXIAL 7X45MM (Screw) ×8 IMPLANT
SCREW SET SINGLE INNER (Screw) ×10 IMPLANT
SPONGE GAUZE 4X4 12PLY STER LF (GAUZE/BANDAGES/DRESSINGS) ×2 IMPLANT
SPONGE INTESTINAL PEANUT (DISPOSABLE) ×2 IMPLANT
SPONGE LAP 4X18 X RAY DECT (DISPOSABLE) ×2 IMPLANT
SPONGE SURGIFOAM ABS GEL 100 (HEMOSTASIS) ×2 IMPLANT
STRIP CLOSURE SKIN 1/2X4 (GAUZE/BANDAGES/DRESSINGS) ×4 IMPLANT
SURGIFLO TRUKIT (HEMOSTASIS) IMPLANT
SUT BONE WAX W31G (SUTURE) ×2 IMPLANT
SUT ETHILON 2 0 FS 18 (SUTURE) ×2 IMPLANT
SUT MNCRL AB 4-0 PS2 18 (SUTURE) ×4 IMPLANT
SUT VIC AB 0 CT1 18XCR BRD 8 (SUTURE) ×1 IMPLANT
SUT VIC AB 0 CT1 8-18 (SUTURE) ×1
SUT VIC AB 1 CT1 18XCR BRD 8 (SUTURE) ×2 IMPLANT
SUT VIC AB 1 CT1 8-18 (SUTURE) ×2
SUT VIC AB 2-0 CT2 18 VCP726D (SUTURE) ×2 IMPLANT
SYR 20CC LL (SYRINGE) ×2 IMPLANT
SYR BULB IRRIGATION 50ML (SYRINGE) ×2 IMPLANT
SYR CONTROL 10ML LL (SYRINGE) ×4 IMPLANT
SYR TB 1ML LUER SLIP (SYRINGE) ×2 IMPLANT
TAPE CLOTH SOFT 2X10 (GAUZE/BANDAGES/DRESSINGS) ×2 IMPLANT
TOWEL OR 17X24 6PK STRL BLUE (TOWEL DISPOSABLE) ×2 IMPLANT
TOWEL OR 17X26 10 PK STRL BLUE (TOWEL DISPOSABLE) ×2 IMPLANT
TRAY FOLEY CATH 16FRSI W/METER (SET/KITS/TRAYS/PACK) ×2 IMPLANT
WATER STERILE IRR 1000ML POUR (IV SOLUTION) IMPLANT
YANKAUER SUCT BULB TIP NO VENT (SUCTIONS) ×2 IMPLANT

## 2014-11-24 NOTE — Transfer of Care (Signed)
Immediate Anesthesia Transfer of Care Note  Patient: Clinton [name]  Procedure(s) Performed: Procedure(s) with comments: POSTERIOR LUMBAR FUSION 1 LEVEL (N/A) - Lumbar 4-5 decompression and fusion with instrumentation, autograft, vitoss, bone marrow aspirate  Patient Location: PACU  Anesthesia Type:General  Level of Consciousness: awake  Airway & Oxygen Therapy: Patient Spontanous Breathing and Patient connected to nasal cannula oxygen  Post-op Assessment: Report given to PACU RN and Post -op Vital signs reviewed and stable  Post vital signs: Reviewed and stable  Complications: No apparent anesthesia complications

## 2014-11-24 NOTE — Anesthesia Preprocedure Evaluation (Signed)
Anesthesia Evaluation  Patient identified by MRN, date of birth, ID band Patient awake    Reviewed: Allergy & Precautions, H&P , NPO status , Patient's Chart, lab work & pertinent test results  Airway        Dental   Pulmonary former smoker,          Cardiovascular hypertension, + CAD     Neuro/Psych Anxiety    GI/Hepatic GERD-  ,  Endo/Other  Morbid obesity  Renal/GU      Musculoskeletal  (+) Arthritis -,   Abdominal   Peds  Hematology   Anesthesia Other Findings   Reproductive/Obstetrics                             Anesthesia Physical Anesthesia Plan  ASA: III  Anesthesia Plan: General   Post-op Pain Management:    Induction: Intravenous  Airway Management Planned: Oral ETT  Additional Equipment:   Intra-op Plan:   Post-operative Plan: Extubation in OR  Informed Consent: I have reviewed the patients History and Physical, chart, labs and discussed the procedure including the risks, benefits and alternatives for the proposed anesthesia with the patient or authorized representative who has indicated his/her understanding and acceptance.     Plan Discussed with: CRNA, Anesthesiologist and Surgeon  Anesthesia Plan Comments:         Anesthesia Quick Evaluation

## 2014-11-24 NOTE — Anesthesia Postprocedure Evaluation (Signed)
  Anesthesia Post-op Note  Patient: Clinton [name]  Procedure(s) Performed: Procedure(s) with comments: POSTERIOR LUMBAR FUSION 1 LEVEL (N/A) - Lumbar 4-5 decompression and fusion with instrumentation, autograft, vitoss, bone marrow aspirate  Patient Location: PACU  Anesthesia Type:General  Level of Consciousness: awake, alert , oriented and patient cooperative  Airway and Oxygen Therapy: Patient Spontanous Breathing  Post-op Pain: mild  Post-op Assessment: Post-op Vital signs reviewed, Patient's Cardiovascular Status Stable, Respiratory Function Stable, Patent Airway, No signs of Nausea or vomiting and Pain level controlled  Post-op Vital Signs: stable  Last Vitals:  Filed Vitals:   11/24/14 1230  BP:   Pulse: 64  Temp:   Resp: 17    Complications: No apparent anesthesia complications

## 2014-11-24 NOTE — Plan of Care (Signed)
Problem: Consults Goal: Diagnosis - Spinal Surgery Thoraco/Lumbar Spine Fusion L4-L5

## 2014-11-25 ENCOUNTER — Encounter (HOSPITAL_COMMUNITY): Payer: Self-pay | Admitting: Internal Medicine

## 2014-11-25 DIAGNOSIS — I10 Essential (primary) hypertension: Secondary | ICD-10-CM

## 2014-11-25 DIAGNOSIS — K219 Gastro-esophageal reflux disease without esophagitis: Secondary | ICD-10-CM | POA: Diagnosis present

## 2014-11-25 MED ORDER — METOPROLOL TARTRATE 100 MG PO TABS
100.0000 mg | ORAL_TABLET | Freq: Every day | ORAL | Status: DC
  Administered 2014-11-25 – 2014-11-28 (×4): 100 mg via ORAL
  Filled 2014-11-25 (×5): qty 1

## 2014-11-25 MED ORDER — OXYCODONE HCL ER 10 MG PO T12A
10.0000 mg | EXTENDED_RELEASE_TABLET | Freq: Two times a day (BID) | ORAL | Status: DC
  Administered 2014-11-25 – 2014-11-28 (×7): 10 mg via ORAL
  Filled 2014-11-25 (×7): qty 1

## 2014-11-25 MED ORDER — METOPROLOL TARTRATE 50 MG PO TABS
50.0000 mg | ORAL_TABLET | Freq: Every day | ORAL | Status: DC
  Administered 2014-11-25 – 2014-11-27 (×3): 50 mg via ORAL
  Filled 2014-11-25 (×4): qty 1

## 2014-11-25 NOTE — Progress Notes (Signed)
Patient had issues with anxiety overnight last night.  Refused PCA last night. As a result, patient had significant low back pain overnight. Pain has since improved.  BP 188/84 mmHg  Pulse 77  Temp(Src) 98.8 F (37.1 C) (Oral)  Resp 18  Ht 5\' 10"  (1.778 m)  Wt 98.431 kg (217 lb)  BMI 31.14 kg/m2  SpO2 99%  NVI b/I lower extremitites Dressing CDI  Drain output: 320/12 hours  POD #1 after L4/5 decompression and fusion  - up with PT today - back precautions at all times - patient is anticipating transfer to rehab when appropriate, will follow PT progress (pt is requesting [facility]) - BP is too high, weill recheck and consult medicine if needed

## 2014-11-25 NOTE — Evaluation (Signed)
Physical Therapy Evaluation Patient Details Name: Clinton [name] MRN: [id number] DOB: 15-Mar-1941 Today's Date: 11/25/2014   History of Present Illness  73 yo male s/p L4-5 PLIF PMH: HTN, CAD, anxiety, GERD, arthritis, cardiac surg x2 stents, coronary angioplasty   Clinical Impression  Patient presents with decreased independence with mobility due to deficits listed in PT problem list.  He will benefit from skilled PT in the acute setting to allow return home following SNF rehab stay.      Follow Up Recommendations SNF    Equipment Recommendations  3in1 (PT)    Recommendations for Other Services       Precautions / Restrictions Precautions Precautions: Back Required Braces or Orthoses: Spinal Brace Spinal Brace: Applied in standing position;Applied in sitting position;Thoracolumbosacral orthotic Spinal Brace Comments: initiated in sitting, had to complete donning standing due to brace too long, will contact [employer] for adjustment      Mobility  Bed Mobility               General bed mobility comments: NT as patient up at edge of bed, RN reports had difficulty getting up with their help  Transfers Overall transfer level: Needs assistance Equipment used: Rolling walker (2 wheeled) Transfers: Sit to/from Stand Sit to Stand: Min assist         General transfer comment: increased time to get up to full extension; rolled walker out to prevent posterior LOB once upright  Ambulation/Gait Ambulation/Gait assistance: Min assist Ambulation Distance (Feet): 25 Feet Assistive device: Rolling walker (2 wheeled) Gait Pattern/deviations: Step-through pattern;Shuffle;Decreased stride length     General Gait Details: tends to drag feet and keep walker anterior to BOS with risk for anterior loss of balance  Stairs            Wheelchair Mobility    Modified Rankin (Stroke Patients Only)       Balance Overall balance assessment: Needs assistance            Standing balance-Leahy Scale: Poor Standing balance comment: used walker at home, had to help stabilize patient while holding walker when tightening brace in standing to prevent posterior loss of balance                             Pertinent Vitals/Pain Pain Assessment: Faces Faces Pain Scale: Hurts even more Pain Location: back at surgical site Pain Intervention(s): Monitored during session;Repositioned    Home Living Family/patient expects to be discharged to:: Skilled nursing facility Living Arrangements: Spouse/significant other Available Help at Discharge: Family Type of Home: Apartment Home Access: Level entry     Home Layout: One level Home Equipment: Environmental consultant - 2 wheels      Prior Function Level of Independence: Independent with assistive device(s)         Comments: was driving     Hand Dominance        Extremity/Trunk Assessment               Lower Extremity Assessment: RLE deficits/detail;LLE deficits/detail RLE Deficits / Details: AROM grossly WFL, strength hip flexion at least 3/5, knee extension 4/5, intact sensation on feet per patient, ankle DF WFL LLE Deficits / Details: AROM grossly WFL, strength hip flexion at least 3/5, knee extension 4-/5, intact sensation on feet per patient, ankle DF WFL (genu varus deformity left knee, wears soft elastic brace usually)     Communication   Communication: No difficulties  Cognition Arousal/Alertness: Awake/alert Behavior  During Therapy: WFL for tasks assessed/performed Overall Cognitive Status: Within Functional Limits for tasks assessed                      General Comments      Exercises Total Joint Exercises Ankle Circles/Pumps: AROM;5 reps;Both;Seated      Assessment/Plan    PT Assessment Patient needs continued PT services  PT Diagnosis Abnormality of gait;Generalized weakness;Acute pain   PT Problem List Decreased strength;Decreased range of motion;Decreased  mobility;Decreased safety awareness;Decreased knowledge of use of DME;Decreased knowledge of precautions;Decreased activity tolerance;Decreased balance;Pain  PT Treatment Interventions DME instruction;Gait training;Functional mobility training;Therapeutic activities;Patient/family education;Balance training;Therapeutic exercise   PT Goals (Current goals can be found in the Care Plan section) Acute Rehab PT Goals Patient Stated Goal: To go to rehab prior to d/c home PT Goal Formulation: With patient Time For Goal Achievement: 11/30/14 Potential to Achieve Goals: Good    Frequency Min 5X/week   Barriers to discharge        Co-evaluation               End of Session Equipment Utilized During Treatment: Gait belt;Back brace Activity Tolerance: Patient tolerated treatment well Patient left: in chair;with call bell/phone within reach Nurse Communication: Mobility status         Time: 9937-1696 PT Time Calculation (min) (ACUTE ONLY): 32 min   Charges:   PT Evaluation $Initial PT Evaluation Tier I: 1 Procedure PT Treatments $Gait Training: 8-22 mins $Therapeutic Activity: 8-22 mins   PT G Codes:          Vinh Sachs,CYNDI 12/24/14, 11:21 AM Magda Kiel, [place] December 24, 2014

## 2014-11-25 NOTE — Consult Note (Signed)
[facility] Medical Consultation  Clinton [name] JOA:[id number] DOB: 03/19/1941 DOA: 11/24/2014 PCP: Jonathon Bellows, MD   Requesting physician: Lynann Bologna Date of consultation: 11/25/14 Reason for consultation: uncontrolled hypertension  Impression/Recommendations Active Problems:   HTN (hypertension): better after am metoprolol dosing and adequate pain control. I have adjusted schedule.    CAD (coronary artery disease): stable   Spinal stenosis   GERD (gastroesophageal reflux disease)   [facility] to follow. Thank you for this consultation.  Chief Complaint: back pain  HPI:  20 male had lumbar decompression 12/17 for stenosis. Had pain all night. This morning blood pressure 188/84.  Had not yet received metoprolol which is scheduled 100 mg mornings, 50 mg hs. Pain now better controlled, as is blood pressure  Review of Systems:  Complete review of systems as above, otherwise negative.  Past Medical History  Diagnosis Date  . Hypertension   . History of elevated lipids   . Coronary artery disease     have an appointment with Dr Johnsie Cancel 11/15/14  . Anxiety   . GERD (gastroesophageal reflux disease)   . Arthritis    Past Surgical History  Procedure Laterality Date  . Cardiac surgery      2 stents placed in 2000  . Coronary angioplasty      mid and proximal LAD stent 09/1999 [facility] [address])   Social History:  reports that he quit smoking about 40 years ago. His smoking use included Cigarettes. He has a 5 pack-year smoking history. He has never used smokeless tobacco. He reports that he drinks alcohol. He reports that he does not use illicit drugs.  No Known Allergies Family History  Problem Relation Age of Onset  . Heart Problems Father     Prior to Admission medications   Medication Sig Start Date End Date Taking? Authorizing Provider  acetaminophen (TYLENOL) 500 MG tablet Take 500 mg by mouth every 6 (six) hours as needed.   Yes Historical Provider, MD   calcium carbonate (TUMS EX) 750 MG chewable tablet Chew 1 tablet by mouth daily as needed for heartburn.   Yes Historical Provider, MD  metoprolol (LOPRESSOR) 50 MG tablet Take 50-100 mg by mouth See admin instructions. Takes 2 tablets in the morning, and 1 tablet in the evening   Yes Historical Provider, MD  ranitidine (ZANTAC) 150 MG tablet Take 150 mg by mouth daily as needed for heartburn.   Yes Historical Provider, MD  traMADol Veatrice Bourbon) 50 MG tablet  10/11/14  Yes Historical Provider, MD   Physical Exam: Blood pressure 103/54, pulse 61, temperature 98.7 F (37.1 C), temperature source Oral, resp. rate 18, height 5\' 10"  (1.778 m), weight 98.431 kg (217 lb), SpO2 97 %. Filed Vitals:   11/25/14 1356  BP: 103/54  Pulse: 61  Temp: 98.7 F (37.1 C)  Resp: 18   BP 118/47 mmHg  Pulse 72  Temp(Src) 98.4 F (36.9 C) (Axillary)  Resp 18  Ht 5\' 10"  (1.778 m)  Wt 98.431 kg (217 lb)  BMI 31.14 kg/m2  SpO2 100%  General Appearance:    Alert, comfortable. At side of bed. talkative  Head:    Normocephalic, without obvious abnormality, atraumatic  Eyes:    PERRL, conjunctiva/corneas clear, EOM's intact,        Nose:   Nares normal, septum midline, mucosa normal, no drainage   or sinus tenderness  Throat:   Lips, mucosa, and tongue normal; teeth and gums normal  Neck:   Supple, symmetrical, trachea midline,  no adenopathy;       thyroid:  No enlargement/tenderness/nodules; no carotid   bruit or JVD  Back:     Incision ok. Drain with sanguinous drainage  Lungs:     Clear to auscultation bilaterally, respirations unlabored  Chest wall:    No tenderness or deformity  Heart:    Regular rate and rhythm, S1 and S2 normal, no murmur, rub   or gallop  Abdomen:     Soft, non-tender, bowel sounds active all four quadrants,    no masses, no organomegaly  Extremities:   Extremities normal, atraumatic, no cyanosis or edema  Pulses:   2+ and symmetric all extremities  Skin:   Skin color, texture,  turgor normal, no rashes or lesions  Lymph nodes:   Cervical, supraclavicular, and axillary nodes normal  Neurologic:   CNII-XII intact. Normal strength, sensation and reflexes      throughout    Psych: normal affect  Labs on Admission:  Basic Metabolic Panel: No results for input(s): NA, K, CL, CO2, GLUCOSE, BUN, CREATININE, CALCIUM, MG, PHOS in the last 168 hours. Liver Function Tests: No results for input(s): AST, ALT, ALKPHOS, BILITOT, PROT, ALBUMIN in the last 168 hours. No results for input(s): LIPASE, AMYLASE in the last 168 hours. No results for input(s): AMMONIA in the last 168 hours. CBC: No results for input(s): WBC, NEUTROABS, HGB, HCT, MCV, PLT in the last 168 hours. Cardiac Enzymes: No results for input(s): CKTOTAL, CKMB, CKMBINDEX, TROPONINI in the last 168 hours. BNP: Invalid input(s): POCBNP CBG: No results for input(s): GLUCAP in the last 168 hours.  Radiological Exams on Admission: Dg Lumbar Spine 2-3 Views  11/24/2014   CLINICAL DATA:  L4-5 laminectomy and fusion.  EXAM: DG C-ARM 61-120 MIN; LUMBAR SPINE - 2-3 VIEW  COMPARISON:  Intraoperative localization films lumbar spine 11/24/2014.  FINDINGS: We are provided with 2 fluoroscopic intraoperative spot views of the lumbar spine. Images demonstrate pedicle screws and stabilization bars in place at L4-5. Trace anterolisthesis L4 on L5 is noted. Hardware is intact. No fracture is identified.  IMPRESSION: L4-5 fusion.   Electronically Signed   By: Inge Rise M.D.   On: 11/24/2014 15:23   Dg Lumbar Spine 2-3 Views  11/24/2014   CLINICAL DATA:  For posterior fusion of L4-5  EXAM: LUMBAR SPINE - 2-3 VIEW  COMPARISON:  Lumbar spine films of 11/01/2013  FINDINGS: A needle is positioned posteriorly directed toward the spinous process of L2 with the more caudal needle directed toward the spinous process of L5 posteriorly.  On image 2, an instrument is directed toward the L4-5 interspace for localization.  IMPRESSION:  Localization of L4-5 on last image.   Electronically Signed   By: Ivar Drape M.D.   On: 11/24/2014 09:38   Dg C-arm 1-60 Min  11/24/2014   CLINICAL DATA:  L4-5 laminectomy and fusion.  EXAM: DG C-ARM 61-120 MIN; LUMBAR SPINE - 2-3 VIEW  COMPARISON:  Intraoperative localization films lumbar spine 11/24/2014.  FINDINGS: We are provided with 2 fluoroscopic intraoperative spot views of the lumbar spine. Images demonstrate pedicle screws and stabilization bars in place at L4-5. Trace anterolisthesis L4 on L5 is noted. Hardware is intact. No fracture is identified.  IMPRESSION: L4-5 fusion.   Electronically Signed   By: Inge Rise M.D.   On: 11/24/2014 15:23    [place] [email or web address]  11/25/2014, 5:04 PM

## 2014-11-25 NOTE — Progress Notes (Signed)
Patient very anxious upon shift change. Patient stated that "he should have not had this surgery" and that, "he didn't think that it would hurt this bad".  Patient also stated that, "he should have just dealt with the pain because he was going to die anyway". Reassured patient that the first night is usually the worst for patients and that it will get easier as the days go on. Patient c/o of acid reflux, felt that he was wetting the bed and that he was bleeding all over the bed. Patient was reassured that he had a foley catheter, the jp drain was necessary to pull any fluid away from the incision site and that he was not bleeding all over the bed. Patient finally calm down. Patient refused pca. Patient stated that, he didn't want it and that he didn't think he could do it. Patient's b/p was 188/84 and believe that his elevated b/p was due to pain and anxiety. Paged on call MD. Gave patient one percocet and seems to be resting comfortably. Will continue to monitor.

## 2014-11-25 NOTE — Op Note (Signed)
NAMEDONTA, Clinton [name] NO.:  [id number]  MEDICAL RECORD NO.:  [id number]  LOCATION:  [id number]                        FACILITY:  [facility]  PHYSICIAN:  Phylliss Bob, MD      DATE OF BIRTH:  [birth year]  DATE OF PROCEDURE:  11/24/2014                              OPERATIVE REPORT   PREOPERATIVE DIAGNOSES: 1. L4-5 spinal stenosis. 2. Grade 1 L4-5 spondylolisthesis. 3. Bilateral leg pain.  POSTOPERATIVE DIAGNOSES: 1. L4-5 spinal stenosis. 2. Grade 1 L4-5 spondylolisthesis. 3. Bilateral leg pain.  PROCEDURES: 1. L4-5 decompression with bilateral partial facetectomy and bilateral     foraminotomy. 2. L4-5 posterolateral fusion. 3. Placement of posterior instrumentation L4, L5, (7 x 45 mm screws). 4. Use of local autograft. 5. Intraoperative bone marrow aspiration from the patient's right     iliac crest using a separate incision.  SURGEON:  Phylliss Bob, MD  ASSISTANT:  Pricilla Holm, PA-C  ANESTHESIA:  General endotracheal anesthesia.  COMPLICATIONS:  None.  DISPOSITION:  Stable.  ESTIMATED BLOOD LOSS:  300 mL.  INDICATIONS FOR SURGERY:  Briefly, Clinton [name] is a very pleasant 73 year old male, who did present to me with severe bilateral leg pain.  At the time of the surgery, he did have ongoing pain for approximately 1 year. He did fail multiple forms of conservative care, including multiple epidural injections.  He did, however, continued to have ongoing pain. We therefore did discuss proceeding with the procedure noted above.  The patient did fully understand the risks and limitations of the procedure as outlined in my preoperative note.  OPERATIVE DETAILS:  On November 24, 2014, the patient was brought to Surgery and general endotracheal anesthesia was administered.  The patient was placed prone on a well-padded flat Jackson bed with a spinal frame.  Antibiotics were given and the back was prepped and draped.  A midline incision was then made.  The  fascia was incised at the midline and the paraspinal musculature was retracted.  The lamina of L4 and L5 were subperiosteally exposed.  A lateral intraoperative radiograph did confirm the appropriate operative levels.  I then subperiosteally exposed the L4-5 facet joint and the transverse processes of L4 and L5. Using anatomic landmarks, I did cannulate the L4 and L5 pedicles bilaterally.  A 6-mm tap was used and a ball-tip probe was used to confirm that there was no cortical violation.  Bone wax was placed in the cannulated pedicle holes.  The posterolateral gutters were then packed using Ray-Tec.  I then performed a thorough L4-5 decompression. The spinous process of L4 was removed and a meticulous bilateral partial facetectomy and bilateral foraminotomy was performed.  I was very pleased with the final decompression.  There was no extravasation of cerebrospinal fluid noted.  I then placed Gelfoam in the epidural space to help control bleeding.  I then copiously irrigated the wound with approximately 2 L of normal saline.  I then used a high-speed bur to decorticate the transverse processes of L4 and L5 in addition to the L4- 5 facet joint.  I then harvested 8 mL of bone marrow aspirate from the patient's right iliac crest using a separate incision.  This was mixed with Vitoss.  Autograft from the decompression was mixed with the Vitoss and bone marrow aspirate.  A 50% of this was placed in the left posterolateral gutter and 50% was placed on the right.  The bone graft was placed after decorticating the L4-5 facet joint and the L4 and L5 transverse processes.  I then placed a 7 x 45 mm screws bilaterally at L4 and L5.  I did use triggered EMG to test each of the screws, and there was no screw that tested below 20 milliamps.  The rods were then placed and followed by caps and a final locking procedure was performed. I was very pleased with the final AP and lateral fluoroscopic images.   I then placed a #15 deep Blake drain.  The Gelfoam was then removed from the epidural space and bleeding was adequately controlled.  The fascia was then closed using #1 Vicryl.  The subcutaneous layer was closed using 2-0 Vicryl and the skin was closed using 3-0 Monocryl.  Benzoin and Steri-Strips were applied followed by a sterile dressing.  The drain was also sewn into place.  Of note, the instrument counts were correct at the termination of the procedure, however, it was noted that the string coming off one of the patties was shorter than typical.  When this was identified, the wound was thoroughly explored.  Specifically, the region of the spinal canal, the lamina, the posterolateral gutter, and the entirety of the wound was explored, and there was no evidence of retained string into the wound.  Of note, Pricilla Holm was my assistant throughout the entire surgery, and did aid in retraction, suctioning, and closure.  Also of note, there was no sustained abnormal EMG activity noted throughout the surgery.     Phylliss Bob, MD     MD/MEDQ  D:  11/24/2014  T:  11/25/2014  Job:  [id number]

## 2014-11-25 NOTE — Clinical Social Work Psychosocial (Signed)
Clinical Social Work Department BRIEF PSYCHOSOCIAL ASSESSMENT 11/25/2014  Patient:  Clinton [name], Clinton [name]     Account Number:  [id number]     Admit date:  11/24/2014  Clinical Social Worker:  Wylene Men  Date/Time:  11/25/2014 01:43 PM  Referred by:  Physician  Date Referred:  11/25/2014 Referred for  SNF Placement  Psychosocial assessment   Other Referral:   none   Interview type:  Patient Other interview type:   patient    PSYCHOSOCIAL DATA Living Status:  WIFE Admitted from facility:   Level of care:   Primary support name:  Clinton [name] Primary support relationship to patient:  SPOUSE Degree of support available:   adequate/strong    CURRENT CONCERNS Current Concerns  Post-Acute Placement   Other Concerns:   none    SOCIAL WORK ASSESSMENT / PLAN CSW assessed pt at bedside.  patient is alert and oriented. PT is recommending SNF/STR at time of dc.  Patient wishes to return home, but is agreeble and understands the need for STR/SNF at time of dc.  Patient reports being from home with wife prior to admission to hospital.  PT is agreeable to sending referral to [facility].  Patient is hopeful to return home with wife upon completion of STR.   Assessment/plan status:  Psychosocial Support/Ongoing Assessment of Needs Other assessment/ plan:   FL2  PASARR   Information/referral to community resources:   SNF/STR    PATIENT'S/FAMILY'S RESPONSE TO PLAN OF CARE: Patient is agreeable to [facility] search.       Nonnie Done, [place] [phone]  Psychiatric & Orthopedics (5N 1-16) Clinical Social Worker

## 2014-11-26 DIAGNOSIS — K219 Gastro-esophageal reflux disease without esophagitis: Secondary | ICD-10-CM

## 2014-11-26 NOTE — Evaluation (Addendum)
Occupational Therapy Evaluation Patient Details Name: Clinton [name] MRN: [id number] DOB: 06/19/1941 Today's Date: 11/26/2014    History of Present Illness 73 yo male s/p L4-5 PLIF PMH: HTN, CAD, anxiety, GERD, arthritis, cardiac surg x2 stents, coronary angioplasty    Clinical Impression   Pt s/p above. Pt independent with ADLs, PTA. Feel pt will benefit from acute OT to increase independence with BADLs and to reinforce back precautions prior to d/c. Recommending SNF for rehab.    Follow Up Recommendations  SNF    Equipment Recommendations  Other (comment) (defer to next venue)    Recommendations for Other Services       Precautions / Restrictions Precautions Precautions: Back Precaution Booklet Issued: No Precaution Comments: reviewed back precautions Required Braces or Orthoses: Spinal Brace Spinal Brace: Applied in sitting position;Thoracolumbosacral orthotic Spinal Brace Comments: adjusted in standing; pt did not have brace on when OT arrived and states the PA told him he did not have to wear in when sitting in chair? OT donned with pt in chair Restrictions Weight Bearing Restrictions: No      Mobility Bed Mobility               General bed mobility comments: not assessed  Transfers Overall transfer level: Needs assistance Equipment used: Rolling walker (2 wheeled) Transfers: Sit to/from Stand Sit to Stand: Mod assist         General transfer comment: cues for technique. assist to boost from recliner chair.    Balance                                            ADL Overall ADL's : Needs assistance/impaired Eating/Feeding: Supervision/ safety;Sitting   Grooming: Wash/dry face;Wash/dry hands;Min guard;Standing Grooming Details (indicate cue type and reason): cues for precautions Upper Body Bathing: Set up;Supervision/ safety;Sitting   Lower Body Bathing: Maximal assistance;Sit to/from stand   Upper Body Dressing : Maximal  assistance;Sitting (including back brace)   Lower Body Dressing: Maximal assistance;Sit to/from stand   Toilet Transfer: Min guard;Minimal assistance;Ambulation;RW;Comfort height toilet   Toileting- Clothing Manipulation and Hygiene: Moderate assistance;Sit to/from stand       Functional mobility during ADLs: Min guard;Minimal assistance;Rolling walker General ADL Comments: Educated on AE. Educated on use of bag on walker. Educated on back brace and OT donned-explained no sleeping in brace and have clothing underneath it. Educated on use of cup for oral care and placement of grooming items to avoid breaking precautions. Pt moving slowly in session. Briefly discussed AE.  Cues for precautions in session.      Vision  wears reading glasses                   Perception     Praxis      Pertinent Vitals/Pain Pain Assessment: 0-10 Pain Score:  (5 in left knee and 6 in back) Pain Location: left knee and back Pain Intervention(s): Monitored during session;Repositioned     Hand Dominance Left   Extremity/Trunk Assessment Upper Extremity Assessment Upper Extremity Assessment: LUE deficits/detail LUE Deficits / Details: reports frozen shoulder issue with left shoulder; left hand with tremors today   Lower Extremity Assessment Lower Extremity Assessment: Defer to PT evaluation       Communication Communication Communication: No difficulties   Cognition Arousal/Alertness: Awake/alert Behavior During Therapy: WFL for tasks assessed/performed Overall Cognitive Status: Within Functional Limits for  tasks assessed                     General Comments       Exercises       Shoulder Instructions      Home Living Family/patient expects to be discharged to:: Skilled nursing facility Living Arrangements: Spouse/significant other Available Help at Discharge: Family Type of Home: Apartment Home Access: Level entry     Home Layout: One level                Home Equipment: Environmental consultant - 2 wheels;Bedside commode;Adaptive equipment;Grab bars - tub/shower Adaptive Equipment: Reacher (may have other AE? pt unsure)        Prior Functioning/Environment Level of Independence: Independent with assistive device(s)        Comments: was driving    OT Diagnosis: Acute pain;Generalized weakness   OT Problem List: Decreased strength;Decreased range of motion;Decreased activity tolerance;Impaired balance (sitting and/or standing);Decreased knowledge of use of DME or AE;Decreased knowledge of precautions;Pain   OT Treatment/Interventions: Self-care/ADL training;DME and/or AE instruction;Therapeutic activities;Patient/family education;Balance training    OT Goals(Current goals can be found in the care plan section) Acute Rehab OT Goals Patient Stated Goal: not stated OT Goal Formulation: With patient Time For Goal Achievement: 12/03/14 Potential to Achieve Goals: Good ADL Goals Pt Will Perform Grooming: with set-up;standing Pt Will Perform Lower Body Dressing: with adaptive equipment;sit to/from stand;with min guard assist Pt Will Transfer to Toilet: with supervision;ambulating (3 in 1 over commode) Pt Will Perform Toileting - Clothing Manipulation and hygiene: with set-up;with supervision;sit to/from stand  OT Frequency: Min 2X/week   Barriers to D/C:            Co-evaluation              End of Session Equipment Utilized During Treatment: Gait belt;Rolling walker;Back brace  Activity Tolerance: Patient tolerated treatment well Patient left: in chair;with call bell/phone within reach   Time: 0825-0855 OT Time Calculation (min): 30 min Charges:  OT General Charges $OT Visit: 1 Procedure OT Evaluation $Initial OT Evaluation Tier I: 1 Procedure OT Treatments $Self Care/Home Management : 8-22 mins G-CodesBenito Mccreedy OTR/L [phone] 11/26/2014, 9:51 AM

## 2014-11-26 NOTE — Progress Notes (Signed)
Physical Therapy Treatment Patient Details Name: Clinton [name] MRN: [id number] DOB: 1941-03-29 Today's Date: 11/26/2014    History of Present Illness 73 yo male s/p L4-5 PLIF PMH: HTN, CAD, anxiety, GERD, arthritis, cardiac surg x2 stents, coronary angioplasty     PT Comments    Pt very slow to mobilize and has difficulty applying back precautions during session. Pt is a fall risk due to confusion at times and difficulty clearing Rt LE with gt. Cont to recommend SNF and will follow per POC.   Follow Up Recommendations  SNF     Equipment Recommendations  3in1 (PT)    Recommendations for Other Services       Precautions / Restrictions Precautions Precautions: Back Precaution Booklet Issued: Yes (comment) Precaution Comments: reviewed handout and back precautions with pt; pt demo difficulty applying during session  Required Braces or Orthoses: Spinal Brace Spinal Brace: Applied in sitting position;Thoracolumbosacral orthotic Spinal Brace Comments: pt sitting in chair without TLSO applied correctly; educated pt on importance of TLSO  Restrictions Weight Bearing Restrictions: No    Mobility  Bed Mobility               General bed mobility comments: pt up in chair and returned to chair  Transfers Overall transfer level: Needs assistance Equipment used: Rolling walker (2 wheeled) Transfers: Sit to/from Stand Sit to Stand: Mod assist         General transfer comment: pt with difficulty powering up; (A) to elevate hips and maintain balance when transitioning UEs to RW; cues for hand placement and sequencing   Ambulation/Gait Ambulation/Gait assistance: Min assist Ambulation Distance (Feet): 40 Feet Assistive device: Rolling walker (2 wheeled) Gait Pattern/deviations: Step-through pattern;Decreased dorsiflexion - right;Decreased stride length;Shuffle;Trunk flexed;Narrow base of support Gait velocity: decreased Gait velocity interpretation: Below normal speed for  age/gender General Gait Details: pt with difficulty clearing Rt foot at times; cues for safe gt sequencing and safety with RW; min (A) to balance and maange RW; cues for back precautions when making directional changes   Stairs            Wheelchair Mobility    Modified Rankin (Stroke Patients Only)       Balance Overall balance assessment: Needs assistance Sitting-balance support: Feet supported;No upper extremity supported Sitting balance-Leahy Scale: Fair     Standing balance support: During functional activity;Bilateral upper extremity supported Standing balance-Leahy Scale: Poor Standing balance comment: RW and (A) at all times to stabilize                     Cognition Arousal/Alertness: Awake/alert Behavior During Therapy: WFL for tasks assessed/performed Overall Cognitive Status: No family/caregiver present to determine baseline cognitive functioning Area of Impairment: Problem solving;Safety/judgement;Memory     Memory: Decreased recall of precautions   Safety/Judgement: Decreased awareness of safety;Decreased awareness of deficits   Problem Solving: Difficulty sequencing;Requires verbal cues;Requires tactile cues;Slow processing      Exercises Total Joint Exercises Ankle Circles/Pumps: AROM;Both;10 reps;Seated    General Comments        Pertinent Vitals/Pain Pain Assessment: 0-10 Pain Score: 8  Pain Location: "my back and left hip" Pain Descriptors / Indicators: Sharp;Shooting Pain Intervention(s): Monitored during session;Repositioned;Premedicated before session    Home Living                      Prior Function            PT Goals (current goals can now be found in  the care plan section) Acute Rehab PT Goals Patient Stated Goal: to not have to wear this brace PT Goal Formulation: With patient Time For Goal Achievement: 11/30/14 Potential to Achieve Goals: Good Progress towards PT goals: Progressing toward goals     Frequency  Min 5X/week    PT Plan Current plan remains appropriate    Co-evaluation             End of Session Equipment Utilized During Treatment: Gait belt;Back brace Activity Tolerance: Patient tolerated treatment well Patient left: in chair;with call bell/phone within reach;with chair alarm set     Time: 2947-6546 PT Time Calculation (min) (ACUTE ONLY): 19 min  Charges:  $Gait Training: 8-22 mins                    G CodesGustavus Bryant, [place]  [phone] 11/26/2014, 3:29 PM

## 2014-11-26 NOTE — Progress Notes (Signed)
[facility]  Evaluated Mr. lipton this morning. BP much better controlled now that he is back on home medications. No other issues medical issues to address therefore, I will sign off. Please call us again if needed.  Debbe Odea, MD Pager [email or web address]  password [email or web address]

## 2014-11-26 NOTE — Progress Notes (Signed)
    Patient doing well, pain continues to be very well controlled at this point. Pt has been up with therapy and is pursuing SNF. Good appetite, TLSO brace at bedside . BIL leg px continues to be resolved   Physical Exam: BP 148/53 mmHg  Pulse 77  Temp(Src) 98.6 F (37 C) (Oral)  Resp 18  Ht 5\' 10"  (1.778 m)  Wt 98.431 kg (217 lb)  BMI 31.14 kg/m2  SpO2 97%  Dressing in place, mild pealing up of tape, drain intact, removed without difficulty. -Homans BIL. Pt sitting up comfortably in chair NVI  POD #2 s/p L4-5 Decompression and fusion for spinal stenosis  - Cont PT/OT, encourage ambulation  -TLSO brace at all times when OOB  -Back precautions - Percocet for pain, Valium for muscle spasms, oxycontin - likely d/c SNF Monday  - Reinforce dressing, apply additional tape on top - Drain pulled w/o difficulty  -10cc over past 4hrs, 60cc over past 12 - F/U in office 2 weeks

## 2014-11-27 NOTE — Progress Notes (Signed)
CSW met with this 73 y/o, married, Caucasian, male who presents in hospital garb with restricted movement for case management concerns.  Patient discussed his concern for his wife to not have to travel far to a SNF.  CSW printed out information on [facility] and directions from his house to the SNF.  Patient states that he is interested in going to the facility as it is close to his residence.  CSW let patient know that he is not scheduled for discharge till tomorrow and there is not rush.  Patient asked the CSW to communicate that he is interested and will speak to his wife today.  CSW contacted and left message for social worker at [employer].    CSW signing off, available as needed.  [facility] Clinton [name] ED CSW [phone]

## 2014-11-27 NOTE — Social Work (Signed)
CSW met with patient in order to provide bed offers. Patient is leaning toward [facility] because of proximity to home. CSW will follow up to identify family final decision.   Christene Lye MSW, [place]

## 2014-11-27 NOTE — Progress Notes (Signed)
Physical Therapy Treatment Patient Details Name: Clinton [name] MRN: [id number] DOB: 08/01/1941 Today's Date: 11/27/2014    History of Present Illness 73 yo male s/p L4-5 PLIF PMH: HTN, CAD, anxiety, GERD, arthritis, cardiac surg x2 stents, coronary angioplasty     PT Comments    Pt progressing with mobility but continues to require min (A) with ambulation due to balance deficits. Max cues for back precautions. Pt also not wearing brace upon PT entering room. Pt educated to have brace on when OOB.   Follow Up Recommendations  SNF     Equipment Recommendations  3in1 (PT)    Recommendations for Other Services       Precautions / Restrictions Precautions Precautions: Back Precaution Booklet Issued: Yes (comment) Precaution Comments: reviewed back precautions with pt and wife Required Braces or Orthoses: Spinal Brace Spinal Brace: Applied in sitting position;Thoracolumbosacral orthotic Spinal Brace Comments: pt up in chair without brace on; educated pt and wife on importance of wearing brace at all times when OOB Restrictions Weight Bearing Restrictions: No    Mobility  Bed Mobility               General bed mobility comments: pt up in chair and returned to chair  Transfers Overall transfer level: Needs assistance Equipment used: Rolling walker (2 wheeled) Transfers: Sit to/from Stand Sit to Stand: Mod assist         General transfer comment: (A) to power up and maintain balance; cues for hand placement   Ambulation/Gait Ambulation/Gait assistance: Min assist Ambulation Distance (Feet): 80 Feet Assistive device: Rolling walker (2 wheeled) Gait Pattern/deviations: Decreased stride length;Step-through pattern;Wide base of support;Shuffle;Decreased dorsiflexion - right Gait velocity: decreased   General Gait Details: cues for gt sequencing and safety with RW; pt unsteady and requiring (A) to manage RW and balance; leans posteriorly   Stairs             Wheelchair Mobility    Modified Rankin (Stroke Patients Only)       Balance           Standing balance support: During functional activity;Bilateral upper extremity supported Standing balance-Leahy Scale: Poor Standing balance comment: RW to balance                    Cognition Arousal/Alertness: Awake/alert Behavior During Therapy: WFL for tasks assessed/performed Overall Cognitive Status: No family/caregiver present to determine baseline cognitive functioning Area of Impairment: Problem solving;Safety/judgement;Memory     Memory: Decreased recall of precautions   Safety/Judgement: Decreased awareness of safety;Decreased awareness of deficits   Problem Solving: Difficulty sequencing;Requires verbal cues;Requires tactile cues;Slow processing      Exercises      General Comments        Pertinent Vitals/Pain Pain Assessment: 0-10 Pain Score: 6  Pain Location: surgical site; denies radiating pain Pain Descriptors / Indicators: Aching Pain Intervention(s): Premedicated before session;Monitored during session;Repositioned    Home Living                      Prior Function            PT Goals (current goals can now be found in the care plan section) Acute Rehab PT Goals Patient Stated Goal: to walk around  PT Goal Formulation: With patient Time For Goal Achievement: 11/30/14 Potential to Achieve Goals: Good Progress towards PT goals: Progressing toward goals    Frequency  Min 5X/week    PT Plan Current plan remains appropriate  Co-evaluation             End of Session Equipment Utilized During Treatment: Gait belt;Back brace Activity Tolerance: Patient tolerated treatment well Patient left: in chair;with call bell/phone within reach;with chair alarm set;with family/visitor present     Time: 1521-1540 PT Time Calculation (min) (ACUTE ONLY): 19 min  Charges:  $Gait Training: 8-22 mins                    G CodesGustavus Bryant, [place]  [phone] 11/27/2014, 4:40 PM

## 2014-11-27 NOTE — Progress Notes (Signed)
   Patient doing well, pain well controlled at this point. Pt has been up with therapy with slow progress and is pursuing SNF. Pt interested in [facility]. Good appetite, TLSO brace at bedside . BIL leg px continues to be resolved   Physical Exam: BP 128/72 mmHg  Pulse 89  Temp(Src) 98.1 F (36.7 C) (Oral)  Resp 16  Ht 5\' 10"  (1.778 m)  Wt 98.431 kg (217 lb)  BMI 31.14 kg/m2  SpO2 96%  Dressing in place, mild pealing up of tape, -Homans BIL. Pt sitting up comfortably in chair with SCD's NVI  POD #3 s/p L4-5 Decompression and fusion for spinal stenosis  - Cont PT/OT, encourage ambulation -TLSO brace at all times when OOB -Back precautions - Percocet for pain, Valium for muscle spasms, oxycontin - likely d/c SNF Monday, [facility] - Reinforce dressing, apply additional tape on top  -10cc over past 4hrs, 60cc over past 12 - F/U in office 2 weeks

## 2014-11-28 NOTE — Progress Notes (Signed)
Pt c/o low back pain, in addition to moderate pain radiating from left hip to left knee. He feels like it is a muscular like pain.  Pt very slow to ambulate and at a high risk of falling. Pt refused SCD's while sleeping. Pt wanted a good nights sleep.  Pt's extremities very cool. Cap refill about 4-5 seconds. No neuro issues. Pt stated he had full sensation in all extremities.  Pt was A&O x4 with no confusion. Pain meds and valium made pt very drowsy. Respirations monitored throughout night.

## 2014-11-28 NOTE — Clinical Social Work Placement (Signed)
Clinical Social Work Department CLINICAL SOCIAL WORK PLACEMENT NOTE 11/28/2014  Patient:  Clinton [name], Clinton [name]  Account Number:  [id number] Admit date:  11/24/2014  Clinical Social Worker:  Delrae Sawyers  Date/time:  11/28/2014 04:10 PM  Clinical Social Work is seeking post-discharge placement for this patient at the following level of care:   SKILLED NURSING   (*CSW will update this form in Epic as items are completed)   11/25/2014  Patient/family provided with [place] Department of Clinical Social Work's list of facilities offering this level of care within the geographic area requested by the patient (or if unable, by the patient's family).  11/25/2014  Patient/family informed of their freedom to choose among providers that offer the needed level of care, that participate in Medicare, Medicaid or managed care program needed by the patient, have an available bed and are willing to accept the patient.  11/25/2014  Patient/family informed of [facility]' ownership interest in [facility], as well as of the fact that they are under no obligation to receive care at this facility.  PASARR submitted to EDS on 11/25/2014 PASARR number received on 11/25/2014  FL2 transmitted to all facilities in geographic area requested by pt/family on  11/25/2014 FL2 transmitted to all facilities within larger geographic area on   Patient informed that his/her managed care company has contracts with or will negotiate with  certain facilities, including the following:     Patient/family informed of bed offers received:  11/28/2014 Patient chooses bed at [facility], [place] Physician recommends and patient chooses bed at    Patient to be transferred to [facility], [place] on  11/28/2014 Patient to be transferred to facility by [facility] Patient and family notified of transfer on 11/28/2014 Name of family member notified:  Pt and pt's wife updated.  The following  physician request were entered in Epic:   Additional Comments:  [facility] [place] [phone]) Licensed Clinical Social Worker Orthopedics [phone]) and Surgical [phone])

## 2014-11-28 NOTE — Clinical Social Work Note (Addendum)
UPDATE (4:08 PM): Pt to be discharged to [facility]. Pt updated at bedside.  Facility: [facility] Report number: [id number] Transportation: EMS ([facility])   UPDATE (12:03pm): CSW informed by [facility] pt health insurance is out of network and pt would be responsible for 20% of cost. CSW updated pt and pt's wife with information above. Pt and pt's wife considering other SNF bed offers. CSW to continue to follow and assist with discharge planning needs.  10:17am Pt to be discharged to [facility]. Pt updated at bedside.  Facility: [facility] Report number: [phone] Transportation: EMS ([address]  Lubertha Sayres, [place] ([phone]) Licensed Clinical Social Worker Orthopedics [phone]) and Surgical [phone])

## 2014-11-28 NOTE — Progress Notes (Signed)
CARE MANAGEMENT NOTE 11/28/2014  Patient:  Clinton [name], Clinton [name]   Account Number:  [id number]  Date Initiated:  11/28/2014  Documentation initiated by:  [facility]  Subjective/Objective Assessment:   s/p posterior lumbar fusion L4-5     Action/Plan:   Pt/Ot evals- recommended SNF   Anticipated DC Date:  11/28/2014   Anticipated DC Plan:  SKILLED NURSING FACILITY  In-house referral  Clinical Social Worker      DC Planning Services  CM consult      Choice offered to / List presented to:             Status of service:  Completed, signed off Medicare Important Message given?  YES (If response is "NO", the following Medicare IM given date fields will be blank) Date Medicare IM given:  11/28/2014 Medicare IM given by:  [facility] Date Additional Medicare IM given:   Additional Medicare IM given by:    Discharge Disposition:  [place]  Per UR Regulation:  Reviewed for med. necessity/level of care/duration of stay

## 2014-11-28 NOTE — Progress Notes (Signed)
   Patient doing well, pain well controlled. Pt has been up with therapy with slow progress and is pursuing [facility]. Good appetite, TLSO brace applies though pt reports poor fit and difficulty eating in brace. BIL leg px continues to be resolved   Physical Exam: BP 104/51 mmHg  Pulse 71  Temp(Src) 98.4 F (36.9 C) (Oral)  Resp 18  Ht 5\' 10"  (1.778 m)  Wt 98.431 kg (217 lb)  BMI 31.14 kg/m2  SpO2 96%  Dressing in place, -Homans BIL. Pt sitting up comfortably in chair with TLSO and SCD's NVI  POD #4 s/p L4-5 Decompression and fusion for spinal stenosis  - Cont PT/OT, encourage ambulation -TLSO brace at all times when OOB   -Will consult [employer] regarding fit   -Pt will likely be more comfortable sitting up at edge of bed or in straight chair to eat vs recliner  -Back precautions - Percocet for pain, Valium for muscle spasms, oxycontin - d/c SNF [facility] - F/U in office 2 weeks

## 2014-11-28 NOTE — Discharge Summary (Signed)
Patient ID: Clinton [name] MRN: [id number] DOB/AGE: 14-Jan-1941 73 y.o.  Admit date: 11/24/2014 Discharge date:  11/28/2014  Admission Diagnoses:  Active Problems:   CAD (coronary artery disease)   HTN (hypertension)   Spinal stenosis   GERD (gastroesophageal reflux disease)   Discharge Diagnoses:  Same  Past Medical History  Diagnosis Date  . Hypertension   . History of elevated lipids   . Coronary artery disease     have an appointment with Dr Johnsie Cancel 11/15/14  . Anxiety   . GERD (gastroesophageal reflux disease)   . Arthritis     Surgeries: Procedure(s): POSTERIOR LUMBAR FUSION 1 LEVEL on 11/24/2014   Consultants: Treatment Team:  Delfina Redwood, MD  Discharged Condition: Improved  Hospital Course: Clinton [name] is an 73 y.o. male who was admitted 11/24/2014 for operative treatment of spinal stenosis. Patient has severe unremitting pain that affects sleep, daily activities, and work/hobbies. After pre-op clearance the patient was taken to the operating room on 11/24/2014 and underwent  Procedure(s): POSTERIOR LUMBAR FUSION 1 LEVEL.    Patient was given perioperative antibiotics: Anti-infectives    Start     Dose/Rate Route Frequency Ordered Stop   11/24/14 1530  ceFAZolin (ANCEF) IVPB 1 g/50 mL premix     1 g100 mL/hr over 30 Minutes Intravenous Every 8 hours 11/24/14 1529 11/25/14 0045   11/24/14 0600  ceFAZolin (ANCEF) IVPB 2 g/50 mL premix     2 g100 mL/hr over 30 Minutes Intravenous On call to O.R. 11/23/14 1329 11/24/14 1109       Patient was given sequential compression devices, early ambulation to prevent DVT.  Patient benefited maximally from hospital stay and there were no complications.    Recent vital signs: Patient Vitals for the past 24 hrs:  BP Temp Temp src Pulse Resp SpO2  11/28/14 0458 (!) 104/51 mmHg 98.4 F (36.9 C) Oral 71 18 96 %  11/27/14 2100 128/69 mmHg 97.8 F (36.6 C) Oral 85 16 100 %  11/27/14 1629 110/66 mmHg 97.9 F  (36.6 C) Oral 83 16 98 %     Discharge Medications:     Medication List    TAKE these medications        acetaminophen 500 MG tablet  Commonly known as:  TYLENOL  Take 500 mg by mouth every 6 (six) hours as needed.     calcium carbonate 750 MG chewable tablet  Commonly known as:  TUMS EX  Chew 1 tablet by mouth daily as needed for heartburn.     metoprolol 50 MG tablet  Commonly known as:  LOPRESSOR  Take 50-100 mg by mouth See admin instructions. Takes 2 tablets in the morning, and 1 tablet in the evening     ranitidine 150 MG tablet  Commonly known as:  ZANTAC  Take 150 mg by mouth daily as needed for heartburn.     traMADol 50 MG tablet  Commonly known as:  ULTRAM        Diagnostic Studies: Dg Chest 2 View  11/11/2014   CLINICAL DATA:  Preoperative exam prior to spine surgery; history of coronary artery disease and MI and stent placement, remote history of smoking  EXAM: CHEST  2 VIEW  COMPARISON:  None  FINDINGS: The lungs are well-expanded. There is no focal infiltrate. There are coarse retrocardiac lung markings bilaterally. There is no pleural effusion. The cardiac silhouette is top-normal in size. The pulmonary vascularity is not engorged. There is no pleural  effusion.  There is multilevel degenerative disc space narrowing of the thoracic spine. There are degenerative changes of both shoulders.  IMPRESSION: There is no active cardiopulmonary disease. Chronic bronchitic changes especially in the lower lobes are suspected. There are degenerative changes of the lumbar spine and both shoulders.   Electronically Signed   By: David  Martinique   On: 11/11/2014 16:37   Dg Lumbar Spine 2-3 Views  11/24/2014   CLINICAL DATA:  L4-5 laminectomy and fusion.  EXAM: DG C-ARM 61-120 MIN; LUMBAR SPINE - 2-3 VIEW  COMPARISON:  Intraoperative localization films lumbar spine 11/24/2014.  FINDINGS: We are provided with 2 fluoroscopic intraoperative spot views of the lumbar spine. Images  demonstrate pedicle screws and stabilization bars in place at L4-5. Trace anterolisthesis L4 on L5 is noted. Hardware is intact. No fracture is identified.  IMPRESSION: L4-5 fusion.   Electronically Signed   By: Inge Rise M.D.   On: 11/24/2014 15:23   Dg Lumbar Spine 2-3 Views  11/24/2014   CLINICAL DATA:  For posterior fusion of L4-5  EXAM: LUMBAR SPINE - 2-3 VIEW  COMPARISON:  Lumbar spine films of 11/01/2013  FINDINGS: A needle is positioned posteriorly directed toward the spinous process of L2 with the more caudal needle directed toward the spinous process of L5 posteriorly.  On image 2, an instrument is directed toward the L4-5 interspace for localization.  IMPRESSION: Localization of L4-5 on last image.   Electronically Signed   By: Ivar Drape M.D.   On: 11/24/2014 09:38   Dg C-arm 1-60 Min  11/24/2014   CLINICAL DATA:  L4-5 laminectomy and fusion.  EXAM: DG C-ARM 61-120 MIN; LUMBAR SPINE - 2-3 VIEW  COMPARISON:  Intraoperative localization films lumbar spine 11/24/2014.  FINDINGS: We are provided with 2 fluoroscopic intraoperative spot views of the lumbar spine. Images demonstrate pedicle screws and stabilization bars in place at L4-5. Trace anterolisthesis L4 on L5 is noted. Hardware is intact. No fracture is identified.  IMPRESSION: L4-5 fusion.   Electronically Signed   By: Inge Rise M.D.   On: 11/24/2014 15:23    Disposition: 01-Home or Self Care   4 Days PO Lumbar decompression and fusion for spinal stenosis with resolved radicular symptoms  -Written scripts for pain signed and in chart -D/C instructions sheet printed and in chart -D/C today to [facility]  -TLSO brace at all times when OOB   -Consult to biotech re fit - Can remove bulky outer dressing and shower after 5days PO -F/U in office 2 weeks   Signed: Justice Britain 11/28/2014, 10:06 AM

## 2014-11-28 NOTE — Progress Notes (Signed)
Physical Therapy Treatment Patient Details Name: Clinton [name] MRN: [id number] DOB: 09/26/1941 Today's Date: 11/28/2014    History of Present Illness 73 yo male s/p L4-5 PLIF PMH: HTN, CAD, anxiety, GERD, arthritis, cardiac surg x2 stents, coronary angioplasty     PT Comments    Pt con't to require v/c's for adherence of back precautions. Pt dependent for donning of TLSO. Pt con't to require RW for safe ambulation. Pt con't to benefit from SNF upon d/c to achieve safe mod I function prior to transitioning home.  Follow Up Recommendations  SNF     Equipment Recommendations       Recommendations for Other Services       Precautions / Restrictions Precautions Precautions: Back Precaution Booklet Issued: Yes (comment) Precaution Comments: reviewed back precautions, pt required freq v/c's to adhere during funcitonal activities Required Braces or Orthoses: Spinal Brace Spinal Brace: Applied in sitting position;Thoracolumbosacral orthotic Spinal Brace Comments: pt reports "I can take it off to eat" patient advised to keep it on, as per orders pt requires it on when OOB Restrictions Weight Bearing Restrictions: No    Mobility  Bed Mobility Overal bed mobility: Needs Assistance Bed Mobility: Rolling;Sidelying to Sit Rolling: Min guard Sidelying to sit: Min assist       General bed mobility comments: requires definite use of bedrail and significant increase in time  Transfers Overall transfer level: Needs assistance Equipment used: Rolling walker (2 wheeled) Transfers: Sit to/from Stand Sit to Stand: Min assist         General transfer comment: requires max v/c's for safe hand placement and technique. required assist to scoot to EOB, required significant increase in time  Ambulation/Gait Ambulation/Gait assistance: Min assist Ambulation Distance (Feet): 120 Feet Assistive device: Rolling walker (2 wheeled) Gait Pattern/deviations: Step-through pattern;Decreased  stride length;Trunk flexed;Narrow base of support Gait velocity: decreased Gait velocity interpretation: <1.8 ft/sec, indicative of risk for recurrent falls General Gait Details: v/c's to improve back extension   Stairs            Wheelchair Mobility    Modified Rankin (Stroke Patients Only)       Balance Overall balance assessment: Needs assistance         Standing balance support: During functional activity Standing balance-Leahy Scale: Poor Standing balance comment: requires use of unilateral support. stood x 2 minutes to uriniate and x 3 min to wash hands at sink, pt leaned against skin for stability                    Cognition Arousal/Alertness: Awake/alert Behavior During Therapy: WFL for tasks assessed/performed         Memory: Decreased recall of precautions;Decreased short-term memory   Safety/Judgement: Decreased awareness of safety          Exercises      General Comments        Pertinent Vitals/Pain Pain Assessment: 0-10 Pain Score: 5  (8 s/p PT) Pain Location: surigical site in back Pain Intervention(s): Monitored during session    Home Living                      Prior Function            PT Goals (current goals can now be found in the care plan section) Acute Rehab PT Goals PT Goal Formulation: With patient Time For Goal Achievement: 11/30/14 Potential to Achieve Goals: Good Progress towards PT goals: Progressing toward goals  Frequency  Min 5X/week    PT Plan Current plan remains appropriate    Co-evaluation             End of Session Equipment Utilized During Treatment: Gait belt;Back brace Activity Tolerance: Patient tolerated treatment well Patient left: in bed;with call bell/phone within reach;with nursing/sitter in room     Time: 0811-0855 PT Time Calculation (min) (ACUTE ONLY): 44 min  Charges:  $Gait Training: 8-22 mins $Therapeutic Activity: 23-37 mins                    G  Codes:      Kingsley Callander 11/28/2014, 9:12 AM   Kittie Plater, PT, DPT Pager #: [phone] Office #: [phone]

## 2014-11-29 ENCOUNTER — Non-Acute Institutional Stay (SKILLED_NURSING_FACILITY): Payer: Medicare HMO | Admitting: Adult Health

## 2014-11-29 ENCOUNTER — Encounter: Payer: Self-pay | Admitting: Adult Health

## 2014-11-29 DIAGNOSIS — M4806 Spinal stenosis, lumbar region: Secondary | ICD-10-CM

## 2014-11-29 DIAGNOSIS — M48061 Spinal stenosis, lumbar region without neurogenic claudication: Secondary | ICD-10-CM

## 2014-11-29 DIAGNOSIS — Z981 Arthrodesis status: Secondary | ICD-10-CM | POA: Insufficient documentation

## 2014-11-29 DIAGNOSIS — I10 Essential (primary) hypertension: Secondary | ICD-10-CM

## 2014-11-29 DIAGNOSIS — K219 Gastro-esophageal reflux disease without esophagitis: Secondary | ICD-10-CM

## 2014-11-29 MED ORDER — DIAZEPAM 5 MG PO TABS: 5.0000 mg | ORAL_TABLET | Freq: Four times a day (QID) | ORAL | Status: DC | PRN

## 2014-11-29 MED ORDER — OXYCODONE-ACETAMINOPHEN 5-325 MG PO TABS: 1.0000 | ORAL_TABLET | ORAL | Status: DC | PRN

## 2014-11-29 NOTE — Progress Notes (Signed)
Patient ID: Clinton [name], male   DOB: [birth year], 73 y.o.   MRN: [id number]  [facility]     No Known Allergies     Chief Complaint  Patient presents with  . Hospitalization Follow-up    HPI:  He was hospitalized for a lumbar fusion of L4-5. He is here for short term rehab with his goal to return back home. He is complaining back pain; knee pain and thigh pain. He states he is not getting adequate pain relief.     Past Medical History  Diagnosis Date  . Hypertension   . History of elevated lipids   . Coronary artery disease     have an appointment with Dr Johnsie Cancel 11/15/14  . Anxiety   . GERD (gastroesophageal reflux disease)   . Arthritis     Past Surgical History  Procedure Laterality Date  . Cardiac surgery      2 stents placed in 2000  . Coronary angioplasty      mid and proximal LAD stent 09/1999 [facility])  . Posterior lumbar fusion l4-5  11-24-14    VITAL SIGNS BP 120/80 mmHg  Pulse 80  Ht 5\' 10"  (1.778 m)  Wt 217 lb (98.431 kg)  BMI 31.14 kg/m2   Outpatient Encounter Prescriptions as of 11/29/2014  Medication Sig  . acetaminophen (TYLENOL) 500 MG tablet Take 500 mg by mouth every 6 (six) hours as needed.  . calcium carbonate (TUMS EX) 750 MG chewable tablet Chew 1 tablet by mouth daily as needed for heartburn.  . metoprolol (LOPRESSOR) 50 MG tablet Take 50-100 mg by mouth See admin instructions. Takes 2 tablets in the morning, and 1 tablet in the evening  . ranitidine (ZANTAC) 150 MG tablet Take 150 mg by mouth daily as needed for heartburn.  . traMADol (ULTRAM) 50 MG tablet Take 50 mg by mouth every 6 (six) hours as needed.      SIGNIFICANT DIAGNOSTIC EXAMS  11-11-14: chest x-ray: There is no active cardiopulmonary disease. Chronic bronchitic changes especially in the lower lobes are suspected. There are degenerative changes of the lumbar spine and both shoulders.     LABS REVIEWED:   11-11-14: wbc 6.6; hgb 15.0; hct 43.8; mcv 92.2; plt  189; glucose 97; bun 20; creat 0.79; k+4.4;  na++ 141; liver normal albumin 3.8     Review of Systems  Constitutional: Negative for malaise/fatigue.  Respiratory: Negative for cough and shortness of breath.   Cardiovascular: Positive for leg swelling. Negative for chest pain and palpitations.  Gastrointestinal: Negative for heartburn, abdominal pain and constipation.  Musculoskeletal: Positive for myalgias, back pain and joint pain.       Has back pain; left outer thigh pain and left knee pain   Skin: Negative.   Psychiatric/Behavioral: Negative for depression. The patient is not nervous/anxious.      Physical Exam  Constitutional: No distress.  Obese   Neck: Neck supple. No JVD present.  Cardiovascular: Normal rate, regular rhythm and intact distal pulses.   Respiratory: Effort normal and breath sounds normal. No respiratory distress.  GI: Soft. Bowel sounds are normal. He exhibits no distension.  Musculoskeletal: He exhibits no edema.  Is able to move all extremities; is able to ambulate with walker; has limited range of motion in neck; has mild kyphosis   Skin: Skin is warm and dry. He is not diaphoretic.  Incision line intact steri-strips in place no signs of infection present.        ASSESSMENT/ PLAN:  1. Spinal stenosis; status post lumbar fusion: will continue therapy as directed; will follow up with orthopedics as indicated. Will continue ultram 50 mg every 6 hours as needed for pain. Will begin percocet 5/325 mg 1 or 2 tabs every 4 hours as needed for pain; will being valium 5 mg every 6 hours as needed for muscle spasms and will monitor his status.   2. Hypertension: he is presently stable will continue lopressor 100 mg in the am and 50 mg in the pm and will monitor   3. Jerrye Bushy: will continue zantac 15 mg daily as needed  Time spent with patient 50 minutes.     Ok Edwards NP [facility]  Contact [phone] Monday through Friday 8am- 5pm  After  hours call [phone]

## 2014-11-30 ENCOUNTER — Encounter (HOSPITAL_COMMUNITY): Payer: Self-pay | Admitting: Orthopedic Surgery

## 2014-12-05 ENCOUNTER — Encounter: Payer: Self-pay | Admitting: Adult Health

## 2014-12-05 ENCOUNTER — Other Ambulatory Visit: Payer: Self-pay | Admitting: *Deleted

## 2014-12-05 MED ORDER — TRAMADOL HCL 50 MG PO TABS: 50.0000 mg | ORAL_TABLET | Freq: Four times a day (QID) | ORAL | Status: DC | PRN

## 2014-12-06 ENCOUNTER — Encounter: Payer: Self-pay | Admitting: Internal Medicine

## 2014-12-06 ENCOUNTER — Non-Acute Institutional Stay (SKILLED_NURSING_FACILITY): Payer: Medicare HMO | Admitting: Internal Medicine

## 2014-12-06 DIAGNOSIS — I10 Essential (primary) hypertension: Secondary | ICD-10-CM

## 2014-12-06 DIAGNOSIS — I2510 Atherosclerotic heart disease of native coronary artery without angina pectoris: Secondary | ICD-10-CM

## 2014-12-06 DIAGNOSIS — M4806 Spinal stenosis, lumbar region: Secondary | ICD-10-CM

## 2014-12-06 DIAGNOSIS — Z981 Arthrodesis status: Secondary | ICD-10-CM

## 2014-12-06 DIAGNOSIS — M48061 Spinal stenosis, lumbar region without neurogenic claudication: Secondary | ICD-10-CM

## 2014-12-06 DIAGNOSIS — K219 Gastro-esophageal reflux disease without esophagitis: Secondary | ICD-10-CM

## 2014-12-09 NOTE — Progress Notes (Signed)
Patient ID: Clinton [name], male   DOB: 1941/04/14, 74 y.o.   MRN: [id number]    HISTORY AND PHYSICAL  Location:  [facility] [phone])   Extended Emergency Contact Information Primary Emergency Contact: [name],Clinton Address: [id number]          [place], [place] [address] [name] of [place] Phone: [phone] Relation: Spouse Secondary Emergency Contact: [place], [place] [address] [place] of [place] Phone: [phone] Relation: Son  Advanced Directive information   Full Code  Chief Complaint  Patient presents with  . New Admit To SNF    lumbar spinal stenosis s/p lumbar fusion, CAD, HTN, GERD    HPI:  74 yo male seen today as a new admission to SNF for above. He reports pain in his back 7/10 on scale that is controlled with pain medications. He is going to PT and is tolerating rehab. Ambulates with rolling walker. He wears back brace as instructed. No numbness or tingling. No loss of bowel/bladder control. He is scheduled to see Ortho Dr Lynann Bologna on 12/07/14. No nursing issues. Eating well and actually leaves the bldg with his wife for some meals.  He last saw cardio Dr Johnsie Cancel earlier this month. He denies CP, SOB or palpitations.  Past Medical History  Diagnosis Date  . Hypertension   . History of elevated lipids   . Coronary artery disease     have an appointment with Dr Johnsie Cancel 11/15/14  . Anxiety   . GERD (gastroesophageal reflux disease)   . Arthritis   . Spinal stenosis 11/24/2014    Past Surgical History  Procedure Laterality Date  . Cardiac surgery      2 stents placed in 2000  . Coronary angioplasty      mid and proximal LAD stent 09/1999 [facility])  . Posterior lumbar fusion l4-5  11-24-14    Patient Care Team: Jonathon Bellows, MD as PCP - General (Family Medicine)  History   Social History  . Marital Status: Married    Spouse Name: N/A    Number of  Children: N/A  . Years of Education: N/A   Occupational History  . Not on file.   Social History Main Topics  . Smoking status: Former Smoker -- 0.50 packs/day for 10 years    Types: Cigarettes    Quit date: 11/11/1974  . Smokeless tobacco: Never Used     Comment: age 89 yrs  . Alcohol Use: Yes     Comment: occasionally  . Drug Use: No  . Sexual Activity: Not on file   Other Topics Concern  . Not on file   Social History Narrative     reports that he quit smoking about 40 years ago. His smoking use included Cigarettes. He has a 5 pack-year smoking history. He has never used smokeless tobacco. He reports that he drinks alcohol. He reports that he does not use illicit drugs.  Family History  Problem Relation Age of Onset  . Heart Problems Father    No family status information on file.     There is no immunization history on file for this patient.  No Known Allergies  Medications: Patient's Medications  New Prescriptions   No medications on file  Previous Medications   ACETAMINOPHEN (TYLENOL) 500 MG TABLET    Take 500 mg by mouth every 6 (six) hours as needed.  CALCIUM CARBONATE (TUMS EX) 750 MG CHEWABLE TABLET    Chew 1 tablet by mouth daily as needed for heartburn.   DIAZEPAM (VALIUM) 5 MG TABLET    Take 1 tablet (5 mg total) by mouth every 6 (six) hours as needed for anxiety. For muscle spasms   METOPROLOL (LOPRESSOR) 50 MG TABLET    Take 50-100 mg by mouth See admin instructions. Takes 2 tablets in the morning, and 1 tablet in the evening   OXYCODONE-ACETAMINOPHEN (ROXICET) 5-325 MG PER TABLET    Take 1-2 tablets by mouth every 4 (four) hours as needed for moderate pain or severe pain.   RANITIDINE (ZANTAC) 150 MG TABLET    Take 150 mg by mouth daily as needed for heartburn.   TRAMADOL (ULTRAM) 50 MG TABLET    Take 1 tablet (50 mg total) by mouth every 6 (six) hours as needed.  Modified Medications   No medications on file  Discontinued Medications   No  medications on file    Review of Systems As above. All other systems reviewed are negative   Filed Vitals:   12/05/14 0011  BP: 150/82  Pulse: 77  Temp: 98.6 F (37 C)  SpO2: 93%   There is no weight on file to calculate BMI.  Physical Exam  CONSTITUTIONAL: Looks well in NAD. Awake, alert and oriented x 3 HEENT: PERRLA. Oropharynx clear and without exudate. MMM NECK: Supple. Nontender. No palpable cervical or supraclavicular lymph nodes. No carotid bruit b/l.   CVS: Regular rate without murmur, gallop or rub. LUNGS: CTA b/l no wheezing, rales or rhonchi. ABDOMEN: unable to assess as he is wearing a back brace EXTREMITIES: +1 pitting LE edema b/l. Distal pulses palpable. No calf tenderness.  MUSC: Gait is unsteady PSYCH: Affect, behavior and mood normal  Labs reviewed: Hospital Outpatient Visit on 11/11/2014  Component Date Value Ref Range Status  . aPTT 11/11/2014 34  24 - 37 seconds Final  . WBC 11/11/2014 6.6  4.0 - 10.5 K/uL Final  . RBC 11/11/2014 4.75  4.22 - 5.81 MIL/uL Final  . Hemoglobin 11/11/2014 15.0  13.0 - 17.0 g/dL Final  . HCT 11/11/2014 43.8  39.0 - 52.0 % Final  . MCV 11/11/2014 92.2  78.0 - 100.0 fL Final  . MCH 11/11/2014 31.6  26.0 - 34.0 pg Final  . MCHC 11/11/2014 34.2  30.0 - 36.0 g/dL Final  . RDW 11/11/2014 13.1  11.5 - 15.5 % Final  . Platelets 11/11/2014 189  150 - 400 K/uL Final  . Neutrophils Relative % 11/11/2014 73  43 - 77 % Final  . Neutro Abs 11/11/2014 4.9  1.7 - 7.7 K/uL Final  . Lymphocytes Relative 11/11/2014 16  12 - 46 % Final  . Lymphs Abs 11/11/2014 1.0  0.7 - 4.0 K/uL Final  . Monocytes Relative 11/11/2014 8  3 - 12 % Final  . Monocytes Absolute 11/11/2014 0.5  0.1 - 1.0 K/uL Final  . Eosinophils Relative 11/11/2014 2  0 - 5 % Final  . Eosinophils Absolute 11/11/2014 0.1  0.0 - 0.7 K/uL Final  . Basophils Relative 11/11/2014 1  0 - 1 % Final  . Basophils Absolute 11/11/2014 0.0  0.0 - 0.1 K/uL Final  . Sodium 11/11/2014  141  137 - 147 mEq/L Final  . Potassium 11/11/2014 4.4  3.7 - 5.3 mEq/L Final  . Chloride 11/11/2014 106  96 - 112 mEq/L Final  . CO2 11/11/2014 19  19 - 32 mEq/L Final  .  Glucose, Bld 11/11/2014 97  70 - 99 mg/dL Final  . BUN 11/11/2014 20  6 - 23 mg/dL Final  . Creatinine, Ser 11/11/2014 0.79  0.50 - 1.35 mg/dL Final  . Calcium 11/11/2014 9.6  8.4 - 10.5 mg/dL Final  . Total Protein 11/11/2014 7.1  6.0 - 8.3 g/dL Final  . Albumin 11/11/2014 3.8  3.5 - 5.2 g/dL Final  . AST 11/11/2014 18  0 - 37 U/L Final  . ALT 11/11/2014 16  0 - 53 U/L Final  . Alkaline Phosphatase 11/11/2014 55  39 - 117 U/L Final  . Total Bilirubin 11/11/2014 0.5  0.3 - 1.2 mg/dL Final  . GFR calc non Af Amer 11/11/2014 88* >90 mL/min Final  . GFR calc Af Amer 11/11/2014 >90  >90 mL/min Final   Comment: (NOTE) The eGFR has been calculated using the CKD EPI equation. This calculation has not been validated in all clinical situations. eGFR's persistently <90 mL/min signify possible Chronic Kidney Disease.   . Anion gap 11/11/2014 16* 5 - 15 Final  . Prothrombin Time 11/11/2014 13.8  11.6 - 15.2 seconds Final  . INR 11/11/2014 1.05  0.00 - 1.49 Final  . ABO/RH(D) 11/11/2014 O POS   Final  . Antibody Screen 11/11/2014 NEG   Final  . Sample Expiration 11/11/2014 11/25/2014   Final  . Color, Urine 11/11/2014 AMBER* YELLOW Final   BIOCHEMICALS MAY BE AFFECTED BY COLOR  . APPearance 11/11/2014 CLEAR  CLEAR Final  . Specific Gravity, Urine 11/11/2014 1.029  1.005 - 1.030 Final  . pH 11/11/2014 5.5  5.0 - 8.0 Final  . Glucose, UA 11/11/2014 NEGATIVE  NEGATIVE mg/dL Final  . Hgb urine dipstick 11/11/2014 NEGATIVE  NEGATIVE Final  . Bilirubin Urine 11/11/2014 SMALL* NEGATIVE Final  . Ketones, ur 11/11/2014 15* NEGATIVE mg/dL Final  . Protein, ur 11/11/2014 NEGATIVE  NEGATIVE mg/dL Final  . Urobilinogen, UA 11/11/2014 0.2  0.0 - 1.0 mg/dL Final  . Nitrite 11/11/2014 NEGATIVE  NEGATIVE Final  . Leukocytes, UA  11/11/2014 NEGATIVE  NEGATIVE Final   MICROSCOPIC NOT DONE ON URINES WITH NEGATIVE PROTEIN, BLOOD, LEUKOCYTES, NITRITE, OR GLUCOSE <1000 mg/dL.  Marland Kitchen MRSA, PCR 11/11/2014 NEGATIVE  NEGATIVE Final  . Staphylococcus aureus 11/11/2014 NEGATIVE  NEGATIVE Final   Comment:        The Xpert SA Assay (FDA approved for NASAL specimens in patients over 52 years of age), is one component of a comprehensive surveillance program.  Test performance has been validated by [employer] for patients greater than or equal to 19 year old. It is not intended to diagnose infection nor to guide or monitor treatment.   . ABO/RH(D) 11/11/2014 O POS   Final    Dg Chest 2 View  11/11/2014   CLINICAL DATA:  Preoperative exam prior to spine surgery; history of coronary artery disease and MI and stent placement, remote history of smoking  EXAM: CHEST  2 VIEW  COMPARISON:  None  FINDINGS: The lungs are well-expanded. There is no focal infiltrate. There are coarse retrocardiac lung markings bilaterally. There is no pleural effusion. The cardiac silhouette is top-normal in size. The pulmonary vascularity is not engorged. There is no pleural effusion.  There is multilevel degenerative disc space narrowing of the thoracic spine. There are degenerative changes of both shoulders.  IMPRESSION: There is no active cardiopulmonary disease. Chronic bronchitic changes especially in the lower lobes are suspected. There are degenerative changes of the lumbar spine and both shoulders.   Electronically  Signed   By: David  Martinique   On: 11/11/2014 16:37   Dg Lumbar Spine 2-3 Views  11/24/2014   CLINICAL DATA:  L4-5 laminectomy and fusion.  EXAM: DG C-ARM 61-120 MIN; LUMBAR SPINE - 2-3 VIEW  COMPARISON:  Intraoperative localization films lumbar spine 11/24/2014.  FINDINGS: We are provided with 2 fluoroscopic intraoperative spot views of the lumbar spine. Images demonstrate pedicle screws and stabilization bars in place at L4-5. Trace  anterolisthesis L4 on L5 is noted. Hardware is intact. No fracture is identified.  IMPRESSION: L4-5 fusion.   Electronically Signed   By: Inge Rise M.D.   On: 11/24/2014 15:23   Dg Lumbar Spine 2-3 Views  11/24/2014   CLINICAL DATA:  For posterior fusion of L4-5  EXAM: LUMBAR SPINE - 2-3 VIEW  COMPARISON:  Lumbar spine films of 11/01/2013  FINDINGS: A needle is positioned posteriorly directed toward the spinous process of L2 with the more caudal needle directed toward the spinous process of L5 posteriorly.  On image 2, an instrument is directed toward the L4-5 interspace for localization.  IMPRESSION: Localization of L4-5 on last image.   Electronically Signed   By: Ivar Drape M.D.   On: 11/24/2014 09:38   Dg C-arm 1-60 Min  11/24/2014   CLINICAL DATA:  L4-5 laminectomy and fusion.  EXAM: DG C-ARM 61-120 MIN; LUMBAR SPINE - 2-3 VIEW  COMPARISON:  Intraoperative localization films lumbar spine 11/24/2014.  FINDINGS: We are provided with 2 fluoroscopic intraoperative spot views of the lumbar spine. Images demonstrate pedicle screws and stabilization bars in place at L4-5. Trace anterolisthesis L4 on L5 is noted. Hardware is intact. No fracture is identified.  IMPRESSION: L4-5 fusion.   Electronically Signed   By: Inge Rise M.D.   On: 11/24/2014 15:23     Assessment/Plan    ICD-9-CM ICD-10-CM   1. Status post lumbar spinal fusion V45.4 Z98.1   2. Spinal stenosis of lumbar region 724.02 M48.06   3. Essential hypertension 401.9 I10   4. Gastroesophageal reflux disease without esophagitis 530.81 K21.9   5. Coronary artery disease involving native coronary artery of native heart without angina pectoris 414.01 I25.10    - continue pain control with prn percocet  - continue current medications as Rx. He is medically stable  - PT/OT for rehab  - GOAL: short term rehab. Plan to discharge when therapy complete    Dorotha Hirschi S. Perlie Gold  [facility] [address] [place], [place] [address] [phone] Office (Wednesdays and Fridays 8 AM - 5 PM) [phone] Cell (Monday-Friday 8 AM - 5 PM)

## 2014-12-14 ENCOUNTER — Other Ambulatory Visit: Payer: Self-pay | Admitting: *Deleted

## 2014-12-14 DIAGNOSIS — M48061 Spinal stenosis, lumbar region without neurogenic claudication: Secondary | ICD-10-CM

## 2014-12-14 MED ORDER — TRAMADOL HCL 50 MG PO TABS: ORAL_TABLET | ORAL | Status: DC

## 2014-12-14 NOTE — Telephone Encounter (Signed)
[facility] 

## 2014-12-26 ENCOUNTER — Other Ambulatory Visit: Payer: Self-pay | Admitting: *Deleted

## 2014-12-26 DIAGNOSIS — M48061 Spinal stenosis, lumbar region without neurogenic claudication: Secondary | ICD-10-CM

## 2014-12-26 MED ORDER — OXYCODONE-ACETAMINOPHEN 5-325 MG PO TABS: 1.0000 | ORAL_TABLET | ORAL | Status: DC | PRN

## 2014-12-26 NOTE — Telephone Encounter (Signed)
[facility] 

## 2015-08-07 ENCOUNTER — Ambulatory Visit (INDEPENDENT_AMBULATORY_CARE_PROVIDER_SITE_OTHER): Payer: Medicare HMO | Admitting: Adult Health

## 2015-08-07 ENCOUNTER — Encounter: Payer: Self-pay | Admitting: Adult Health

## 2015-08-07 VITALS — BP 120/74 | Temp 98.1°F | Ht 66.0 in | Wt 191.8 lb

## 2015-08-07 DIAGNOSIS — M7501 Adhesive capsulitis of right shoulder: Secondary | ICD-10-CM

## 2015-08-07 DIAGNOSIS — Z7689 Persons encountering health services in other specified circumstances: Secondary | ICD-10-CM

## 2015-08-07 DIAGNOSIS — Z7189 Other specified counseling: Secondary | ICD-10-CM | POA: Diagnosis not present

## 2015-08-07 DIAGNOSIS — I10 Essential (primary) hypertension: Secondary | ICD-10-CM

## 2015-08-07 MED ORDER — METOPROLOL TARTRATE 50 MG PO TABS: 50.0000 mg | ORAL_TABLET | Freq: Two times a day (BID) | ORAL | Status: DC

## 2015-08-07 MED ORDER — LISINOPRIL 20 MG PO TABS: ORAL_TABLET | ORAL | Status: DC

## 2015-08-07 NOTE — Progress Notes (Signed)
HPI:  ABDULKARIM [name] is here to establish care. Pleasant 74 year old Caucasian male, with past medical history,  has a past medical history of Hypertension; History of elevated lipids; Coronary artery disease; Anxiety; GERD (gastroesophageal reflux disease); Arthritis; and Spinal stenosis (11/24/2014). he was seen by [facility] family physicians prior to coming to the [place] office.  Last PCP and physical:Unknown Immunizations:Does not take vaccinations Diet:Does not follow a healthy diet Exercise:Does not exercise Colonoscopy:Never had and does not want one   Has the following chronic problems that require follow up and concerns today:  Bilateral "Frozen Shoulder" - Left shoulder has been "frozen"" for at least 5 years. He endorses that this injury was caused while walking his dog.per the patient he was walking his dog, when the dog saw squirrel and ran after him patient attempted to stop him but the dog pulled on the leash and rotated his shoulder 180. Denies any pain in his shoulder but is unable to lift his arm past halfway.  -Right shoulder has been "frozen" for approximately 10 years.he is unsure of how this injury happened. Endorses that he woke up one day and the shoulder started to become sore, and since that time it's been frozen .over the last month he in endorses increased pain and decreased range of motion.he is only able to raise his right arm halfway as well.he had been driving until a month ago at which time the pain and range of motion became too severe that he is unable to drive. He would like to see somebody about his bilateral frozen shoulders.  Erectile dysfunction  -this has been an ongoing issue with him. He endorses that it becomes difficult to maintain and get an erection. He has used Viagra in the past, and would like a new prescription for this medication.   He had a lumbar fusion of L4-5 in December 2015.  He had angioplasty in November 2000 at [facility], 2 stents placed. He has had no symptoms since that time  ROS negative for unless reported above: fevers, chills,feeling poorly, unintentional weight loss, hearing or vision loss, chest pain, palpitations, leg claudication, struggling to breath,Not feeling congested in the chest, no orthopenia, no cough,no wheezing, normal appetite, no soft tissue swelling, no hemoptysis, melena, hematochezia, hematuria, falls, loc, si, or thoughts of self harm.    Past Medical History  Diagnosis Date  . Hypertension   . History of elevated lipids   . Coronary artery disease     have an appointment with Clinton [name] 11/15/14  . Anxiety   . GERD (gastroesophageal reflux disease)   . Arthritis   . Spinal stenosis 11/24/2014    Past Surgical History  Procedure Laterality Date  . Cardiac surgery      2 stents placed in 2000  . Coronary angioplasty      mid and proximal LAD stent 09/1999 [facility])  . Posterior lumbar fusion l4-5  11-24-14    Family History  Problem Relation Age of Onset  . Heart Problems Father   . Heart disease Father   . Sudden death Father     Social History   Social History  . Marital Status: Married    Spouse Name: N/A  . Number of Children: N/A  . Years of Education: N/A   Social History Main Topics  . Smoking status: Former Smoker -- 0.50 packs/day for 10 years    Types: Cigarettes    Quit date: 11/11/1974  . Smokeless tobacco: Never  Used     Comment: age 61 yrs  . Alcohol Use: Yes     Comment: occasionally  . Drug Use: No  . Sexual Activity: Not Asked   Other Topics Concern  . None   Social History Narrative     Current outpatient prescriptions:  .  acetaminophen (TYLENOL) 500 MG tablet, Take 500 mg by mouth every 6 (six) hours as needed., Disp: , Rfl:  .  calcium carbonate (TUMS EX) 750 MG chewable tablet, Chew 1 tablet by mouth daily as needed for heartburn., Disp: , Rfl:  .  lisinopril (PRINIVIL,ZESTRIL) 20 MG tablet, TK 1 T PO QD,  Disp: 90 tablet, Rfl: 3 .  metoprolol (LOPRESSOR) 50 MG tablet, Take 1 tablet (50 mg total) by mouth 2 (two) times daily. Takes 2 tablets in the morning, and 1 tablet in the evening, Disp: 270 tablet, Rfl: 3 .  ranitidine (ZANTAC) 150 MG tablet, Take 150 mg by mouth daily as needed for heartburn., Disp: , Rfl:   EXAM:  Filed Vitals:   08/07/15 1317  BP: 120/74  Temp: 98.1 F (36.7 C)    Body mass index is 30.97 kg/(m^2).  GENERAL: vitals reviewed and listed above, alert, oriented, appears well hydrated and in no acute distress  HEENT: atraumatic, conjunttiva clear, no obvious abnormalities on inspection of external nose and ears  NECK: Neck is soft and supple without masses, no adenopathy or thyromegaly, trachea midline, no JVD. Normal range of motion.   LUNGS: clear to auscultation bilaterally, no wheezes, rales or rhonchi, good air movement  CV: Regular rate and rhythm, normal S1/S2, no audible murmurs, gallops, or rubs. No carotid bruit and no peripheral edema.   MS: Walks with walker, has steady gait with walker . Unable to raise bilateral arms above greater than halfway. No bony abnormalities noted. Bilateral lower extremity swelling.   Abd: soft/nontender/nondistended/normal bowel sounds   Skin: warm and dry, no rash   Extremities: No clubbing, cyanosis, or edema. Capillary refill is WNL. Pulses intact bilaterally in upper and lower extremities.   Neuro: CN II-XII intact, sensation and reflexes normal throughout, 5/5 muscle strength in bilateral upper and lower extremities. Normal finger to nose. Normal rapid alternating movements. Normal romberg. No pronator drift.   PSYCH: pleasant and cooperative, no obvious depression or anxiety  ASSESSMENT AND PLAN:  1. Encounter to establish care -will get paperwork from primary care provider at [facility] and then set up physical  -Follow-up as needed   2. Frozen shoulder syndrome, right - Ambulatory referral to  Sports Medicine  3. Essential hypertension - No change in medications.  - lisinopril (PRINIVIL,ZESTRIL) 20 MG tablet; TK 1 T PO QD  Dispense: 90 tablet; Refill: 3 - metoprolol (LOPRESSOR) 50 MG tablet; Take 1 tablet (50 mg total) by mouth 2 (two) times daily. Takes 2 tablets in the morning, and 1 tablet in the evening  Dispense: 270 tablet; Refill: 3   -We reviewed the PMH, PSH, FH, SH, Meds and Allergies. -We provided refills for any medications we will prescribe as needed. -We addressed current concerns per orders and patient instructions. -We have asked for records for pertinent exams, studies, vaccines and notes from previous providers. -We have advised patient to follow up per instructions below.   -Patient advised to return or notify a provider immediately if symptoms worsen or persist or new concerns arise.    [name], AGNP

## 2015-08-07 NOTE — Patient Instructions (Signed)
It was great meeting you today!  Someone will call you to schedule a sports medicine follow up.   Follow up as needed.

## 2015-08-07 NOTE — Progress Notes (Signed)
Pre visit review using our clinic review tool, if applicable. No additional management support is needed unless otherwise documented below in the visit note. 

## 2015-08-22 ENCOUNTER — Encounter: Payer: Self-pay | Admitting: Adult Health

## 2015-08-23 ENCOUNTER — Telehealth: Payer: Self-pay

## 2015-08-23 NOTE — Telephone Encounter (Signed)
Per Clinton [name]'s request called pt to advise that his last physical exam was on 4.15.2015 per his records.  Pt needs to be scheduled in the next available annual wellness exam slot.

## 2015-08-28 ENCOUNTER — Ambulatory Visit: Payer: Medicare HMO | Admitting: Family Medicine

## 2015-09-04 NOTE — Telephone Encounter (Signed)
Pt scheduled  

## 2015-10-12 ENCOUNTER — Encounter: Payer: Self-pay | Admitting: Adult Health

## 2015-10-12 ENCOUNTER — Encounter: Payer: Medicare HMO | Admitting: Adult Health

## 2015-10-12 ENCOUNTER — Ambulatory Visit (INDEPENDENT_AMBULATORY_CARE_PROVIDER_SITE_OTHER): Payer: Medicare HMO | Admitting: Adult Health

## 2015-10-12 DIAGNOSIS — I10 Essential (primary) hypertension: Secondary | ICD-10-CM | POA: Diagnosis not present

## 2015-10-12 DIAGNOSIS — Z125 Encounter for screening for malignant neoplasm of prostate: Secondary | ICD-10-CM

## 2015-10-12 DIAGNOSIS — Z0000 Encounter for general adult medical examination without abnormal findings: Secondary | ICD-10-CM

## 2015-10-12 LAB — CBC WITH DIFFERENTIAL/PLATELET
Basophils Absolute: 0 10*3/uL (ref 0.0–0.1)
Basophils Relative: 0.3 % (ref 0.0–3.0)
Eosinophils Absolute: 0 10*3/uL (ref 0.0–0.7)
Eosinophils Relative: 0.5 % (ref 0.0–5.0)
HCT: 45.1 % (ref 39.0–52.0)
Hemoglobin: 15.4 g/dL (ref 13.0–17.0)
LYMPHS ABS: 1.7 10*3/uL (ref 0.7–4.0)
Lymphocytes Relative: 24.5 % (ref 12.0–46.0)
MCHC: 34.1 g/dL (ref 30.0–36.0)
MCV: 94.6 fl (ref 78.0–100.0)
Monocytes Absolute: 0.5 10*3/uL (ref 0.1–1.0)
Monocytes Relative: 7.7 % (ref 3.0–12.0)
NEUTROS ABS: 4.5 10*3/uL (ref 1.4–7.7)
NEUTROS PCT: 67 % (ref 43.0–77.0)
PLATELETS: 208 10*3/uL (ref 150.0–400.0)
RBC: 4.77 Mil/uL (ref 4.22–5.81)
RDW: 13.7 % (ref 11.5–15.5)
WBC: 6.8 10*3/uL (ref 4.0–10.5)

## 2015-10-12 LAB — POCT URINALYSIS DIPSTICK
BILIRUBIN UA: NEGATIVE
Blood, UA: NEGATIVE
Glucose, UA: NEGATIVE
LEUKOCYTES UA: NEGATIVE
NITRITE UA: NEGATIVE
Spec Grav, UA: 1.025
Urobilinogen, UA: 0.2
pH, UA: 5.5

## 2015-10-12 LAB — LIPID PANEL
CHOL/HDL RATIO: 3
Cholesterol: 183 mg/dL (ref 0–200)
HDL: 59.7 mg/dL (ref >39.00)
LDL Cholesterol: 110 mg/dL — ABNORMAL HIGH (ref 0–99)
NonHDL: 122.89
TRIGLYCERIDES: 66 mg/dL (ref 0.0–149.0)
VLDL: 13.2 mg/dL (ref 0.0–40.0)

## 2015-10-12 LAB — HEPATIC FUNCTION PANEL
ALT: 11 U/L (ref 0–53)
AST: 14 U/L (ref 0–37)
Albumin: 4.1 g/dL (ref 3.5–5.2)
Alkaline Phosphatase: 71 U/L (ref 39–117)
BILIRUBIN DIRECT: 0.1 mg/dL (ref 0.0–0.3)
TOTAL PROTEIN: 7.1 g/dL (ref 6.0–8.3)
Total Bilirubin: 0.8 mg/dL (ref 0.2–1.2)

## 2015-10-12 LAB — BASIC METABOLIC PANEL
BUN: 24 mg/dL — AB (ref 6–23)
CALCIUM: 9.9 mg/dL (ref 8.4–10.5)
CO2: 24 meq/L (ref 19–32)
Chloride: 107 mEq/L (ref 96–112)
Creatinine, Ser: 0.87 mg/dL (ref 0.40–1.50)
GFR: 91.24 mL/min (ref >60.00)
GLUCOSE: 93 mg/dL (ref 70–99)
Potassium: 4.2 mEq/L (ref 3.5–5.1)
Sodium: 140 mEq/L (ref 135–145)

## 2015-10-12 LAB — PSA: PSA: 1.72 ng/mL (ref 0.10–4.00)

## 2015-10-12 LAB — TSH: TSH: 2.08 u[IU]/mL (ref 0.35–4.50)

## 2015-10-12 LAB — HEMOGLOBIN A1C: HEMOGLOBIN A1C: 5.4 % (ref 4.6–6.5)

## 2015-10-12 MED ORDER — METOPROLOL TARTRATE 50 MG PO TABS: 50.0000 mg | ORAL_TABLET | Freq: Two times a day (BID) | ORAL | Status: DC

## 2015-10-12 MED ORDER — LISINOPRIL 20 MG PO TABS: ORAL_TABLET | ORAL | Status: DC

## 2015-10-12 NOTE — Patient Instructions (Addendum)
  Clinton [name] , Thank you for taking time to come for your Medicare Wellness Visit. I appreciate your ongoing commitment to your health goals. Please review the following plan we discussed and let me know if I can assist you in the future.   These are the goals we discussed: Goals    . Increase physical activity    . Prevent Falls       This is a list of the screening recommended for you and due dates:  Health Maintenance  Topic Date Due  . Colon Cancer Screening  01/29/1992  . Shingles Vaccine  01/28/2002  . Pneumonia vaccines (1 of 2 - PCV13) 01/28/2007  . Flu Shot  08/07/2035*  . Tetanus Vaccine  12/09/2018  *Topic was postponed. The date shown is not the original due date.   - Follow up with Sports Medicine about your shoulder. Call them at [phone]

## 2015-10-12 NOTE — Progress Notes (Signed)
Pre visit review using our clinic review tool, if applicable. No additional management support is needed unless otherwise documented below in the visit note. 

## 2015-10-12 NOTE — Progress Notes (Signed)
Subjective:    Patient ID: Clinton [name], male    DOB: 1941/04/16, 74 y.o.   MRN: [id number]  HPI  Patient presents for yearly preventative medicine examination.He   has a past medical history of Hypertension; History of elevated lipids; Coronary artery disease; Anxiety; GERD (gastroesophageal reflux disease); Arthritis; Spinal stenosis (11/24/2014); Erectile dysfunction; Osteoarthritis; and Frozen shoulder.  Medicare questionnaire was completed and reviewed  All immunizations and health maintenance protocols were reviewed with the patient and needed orders were placed.- Mr. Leffel does not take vaccinations. He understands the risks that are involved form not being vaccinated.   Appropriate screening laboratory values were ordered for the patient including screening of hyperlipidemia, renal function and hepatic function. If indicated by BPH, a PSA was ordered.  Medication reconciliation,  past medical history, social history, problem list and allergies were reviewed in detail with the patient  Goals were established with regard to weight loss, exercise, and  diet in compliance with medications  End of life planning was discussed.- Does not have a living will or healthcare power of attorney.   He has never had a colonoscopy and does not want one.    Review of Systems  Constitutional: Negative.   HENT: Negative.   Eyes: Negative.   Respiratory: Negative.   Cardiovascular: Negative.   Gastrointestinal: Negative.   Musculoskeletal: Positive for myalgias, back pain, arthralgias and gait problem.   Past Medical History  Diagnosis Date  . Hypertension   . History of elevated lipids   . Coronary artery disease     have an appointment with Dr Johnsie Cancel 11/15/14  . Anxiety   . GERD (gastroesophageal reflux disease)   . Arthritis   . Spinal stenosis 11/24/2014  . Erectile dysfunction   . Osteoarthritis   . Frozen shoulder     Social History   Social History  . Marital  Status: Married    Spouse Name: N/A  . Number of Children: N/A  . Years of Education: N/A   Occupational History  . Not on file.   Social History Main Topics  . Smoking status: Former Smoker -- 0.50 packs/day for 10 years    Types: Cigarettes    Quit date: 11/11/1974  . Smokeless tobacco: Never Used     Comment: age 74 yrs  . Alcohol Use: Yes     Comment: occasionally  . Drug Use: No  . Sexual Activity: Not on file   Other Topics Concern  . Not on file   Social History Narrative    Past Surgical History  Procedure Laterality Date  . Cardiac surgery      2 stents placed   . Coronary angioplasty      mid and proximal LAD stent 09/1999 [facility])  . Posterior lumbar fusion l4-5  11-24-14    Family History  Problem Relation Age of Onset  . Heart Problems Father   . Heart disease Father   . Sudden death Father     No Known Allergies  Current Outpatient Prescriptions on File Prior to Visit  Medication Sig Dispense Refill  . acetaminophen (TYLENOL) 500 MG tablet Take 500 mg by mouth every 6 (six) hours as needed.    . calcium carbonate (TUMS EX) 750 MG chewable tablet Chew 1 tablet by mouth daily as needed for heartburn.    Marland Kitchen lisinopril (PRINIVIL,ZESTRIL) 20 MG tablet TK 1 T PO QD 90 tablet 3  . metoprolol (LOPRESSOR) 50 MG tablet Take 1 tablet (50 mg  total) by mouth 2 (two) times daily. Takes 2 tablets in the morning, and 1 tablet in the evening 270 tablet 3  . ranitidine (ZANTAC) 150 MG tablet Take 150 mg by mouth daily as needed for heartburn.     No current facility-administered medications on file prior to visit.    BP 160/98 mmHg  Pulse 58  Ht 5' 4.57" (1.64 m)  Wt 194 lb 11.2 oz (88.315 kg)  BMI 32.84 kg/m2  SpO2 98%       Objective:   Physical Exam  Constitutional: He is oriented to person, place, and time. He appears well-developed and well-nourished. No distress.  HENT:  Head: Normocephalic and atraumatic.  Right Ear: External ear  normal.  Left Ear: External ear normal.  Nose: Nose normal.  Mouth/Throat: Oropharynx is clear and moist. No oropharyngeal exudate.  TM's visualized  Eyes: Conjunctivae and EOM are normal. Pupils are equal, round, and reactive to light. Right eye exhibits no discharge. Left eye exhibits no discharge. No scleral icterus.  Neck: Normal range of motion. Neck supple. No thyromegaly present.  Cardiovascular: Normal rate, regular rhythm, normal heart sounds and intact distal pulses.  Exam reveals no gallop.   No murmur heard. occasional PVC  Pulmonary/Chest: Effort normal and breath sounds normal. No respiratory distress. He has no wheezes. He exhibits no tenderness.  Abdominal: Soft. Bowel sounds are normal. He exhibits no distension and no mass. There is no tenderness. There is no rebound and no guarding.  Genitourinary:  Refused  Musculoskeletal: He exhibits tenderness. He exhibits no edema.  Has limited ROM on all extremities. More noticeably in his right shoulder.   Walks with a slow steady gate with walker  Lymphadenopathy:    He has no cervical adenopathy.  Neurological: He is alert and oriented to person, place, and time. He has normal reflexes.  Skin: Skin is warm and dry. No rash noted. He is not diaphoretic. No erythema. No pallor.  Scattered skin tags and keratosis   Psychiatric: He has a normal mood and affect. His behavior is normal. Judgment and thought content normal.  Nursing note and vitals reviewed.      Assessment & Plan:  1. Essential hypertension - EKG 12-Lead- Sinus  Bradycardia  -Poor R-wave progression -may be secondary to pulmonary disease   consider old anterior infarct. Rate 55 - lisinopril (PRINIVIL,ZESTRIL) 20 MG tablet; TK 1 T PO QD  Dispense: 90 tablet; Refill: 3 - metoprolol (LOPRESSOR) 50 MG tablet; Take 1 tablet (50 mg total) by mouth 2 (two) times daily. Takes 2 tablets in the morning, and 1 tablet in the evening  Dispense: 270 tablet; Refill: 3  2.  Routine general medical examination at a health care facility - Basic metabolic panel - CBC with Differential/Platelet - EKG 12-Lead - Hemoglobin A1c - Hepatic function panel - Lipid panel - POCT urinalysis dipstick - TSH - PSA - Follow up in 6 months for check up.  - Follow up sooner if needed.

## 2016-01-25 ENCOUNTER — Encounter: Payer: Self-pay | Admitting: Family Medicine

## 2016-01-25 ENCOUNTER — Ambulatory Visit (INDEPENDENT_AMBULATORY_CARE_PROVIDER_SITE_OTHER): Payer: Medicare HMO | Admitting: Family Medicine

## 2016-01-25 VITALS — BP 130/86 | HR 60 | Ht 64.57 in | Wt 197.0 lb

## 2016-01-25 DIAGNOSIS — M75101 Unspecified rotator cuff tear or rupture of right shoulder, not specified as traumatic: Secondary | ICD-10-CM | POA: Insufficient documentation

## 2016-01-25 DIAGNOSIS — M170 Bilateral primary osteoarthritis of knee: Secondary | ICD-10-CM | POA: Diagnosis not present

## 2016-01-25 DIAGNOSIS — M75102 Unspecified rotator cuff tear or rupture of left shoulder, not specified as traumatic: Principal | ICD-10-CM

## 2016-01-25 DIAGNOSIS — M12811 Other specific arthropathies, not elsewhere classified, right shoulder: Secondary | ICD-10-CM

## 2016-01-25 DIAGNOSIS — M12812 Other specific arthropathies, not elsewhere classified, left shoulder: Secondary | ICD-10-CM | POA: Diagnosis not present

## 2016-01-25 NOTE — Assessment & Plan Note (Addendum)
Patient given injections today. Tolerated the procedure well with good resolution of pain. Patient does have significant instability of the knees. Patient may need viscous supplementation as well as likely custom stability braces at some point. Patient given recommendation of over-the-counter medications as well as a trial of topical anti-inflammatories. Patient will try to do this first. Patient will come back and see me again in 4 weeks for further evaluation if worsening symptoms we will consider viscous supplementation.

## 2016-01-25 NOTE — Patient Instructions (Signed)
Good to see you  Ice 20 minutes 2 times daily. Usually after activity and before bed. Keep the joints moving when you can.  Just range of motion and avoid any heavy lifting Injected both knees today  pennsaid pinkie amount topically 2 times daily as needed.  Take tylenol 500 mg three times a day is the best evidence based medicine we have for arthritis.  Vitamin D 2000 IU daily Tumeric 500mg  daily. n. Cortisone injections are an option if these interventions do not seem to make a difference or need more relief.  If cortisone injections do not help, there are different types of shots that may help but they take longer to take effect.  We can discuss this at follow up.  It's important that you continue to stay active. Controlling your weight is important.  Consider physical therapy to strengthen muscles around the joint that hurts to take pressure off of the joint itself. Water aerobics and cycling with low resistance are the best two types of exercise for arthritis. Come back and see me in 4-6 weeks.

## 2016-01-25 NOTE — Progress Notes (Signed)
Pre visit review using our clinic review tool, if applicable. No additional management support is needed unless otherwise documented below in the visit note. 

## 2016-01-25 NOTE — Assessment & Plan Note (Signed)
Severe arthritic changes of the shoulders bilaterally. This is secondary to the amount of arthritis in patient does have bone on bone. We discussed with patient noted increased range of motion was given some very mild home exercises. We discussed icing regimen. We discussed topical anti-inflammatories and given a sample. We discussed over-the-counter medications a could be helpful. Worsening symptoms we'll consider injection at follow-up in 4 weeks.

## 2016-01-25 NOTE — Progress Notes (Signed)
[facility] [address], [place] [address] Phone: [phone] Subjective:    I'm seeing this patient by the request  of:  [name], NP   CC: Right shoulder pain  [id number] Clinton [name] is a 75 y.o. male coming in with complaint of right shoulder pain. Patient states that he is had this problem for quite some time. Patient states that his right shoulder started approximately 10 years ago on his left shoulder started probably 5 years ago. Patient states that he is unable to lift his arms above his had a regular basis. Patient states that over the course of time he has started having increasing pain as well as decreasing range of motion. Can see his primary care provider for this problem in August and was referred here but had not been able to follow-up until now. Patient states sometimes it does affect his daily activities such as dressing as well as possibly driving. severity of pain is 8 out of 10.    Patient is also complaining of bilateral knee pain. States that he is has been seen for severe arthritis in the knees when he was living in [place] greater than 2 years ago. Was getting injections and has even had viscous supplementation. Has avoided any surgical intervention. Ambulate with the aid of a walker. Starting to have more pain and swelling. States that it is severe. Sometimes stops him from activities. Sometimes has some instability and that is why he uses a walker. Has not fallen recently. Rates the severity of pain a 7 out of 10.  I did review a chest x-ray on 11/11/2014. This was independently visualized by me. Even at this time patient did have moderate to severe degenerative arthritic changes.  Past Medical History  Diagnosis Date  . Hypertension   . History of elevated lipids   . Coronary artery disease     have an appointment with Dr Johnsie Cancel 11/15/14  . Anxiety   . GERD (gastroesophageal reflux disease)   . Arthritis     . Spinal stenosis 11/24/2014  . Erectile dysfunction   . Osteoarthritis   . Frozen shoulder    Past Surgical History  Procedure Laterality Date  . Cardiac surgery      2 stents placed   . Coronary angioplasty      mid and proximal LAD stent 09/1999 [facility])  . Posterior lumbar fusion l4-5  11-24-14   Social History   Social History  . Marital Status: Married    Spouse Name: N/A  . Number of Children: N/A  . Years of Education: N/A   Social History Main Topics  . Smoking status: Former Smoker -- 0.50 packs/day for 10 years    Types: Cigarettes    Quit date: 11/11/1974  . Smokeless tobacco: Never Used     Comment: age 40 yrs  . Alcohol Use: Yes     Comment: occasionally  . Drug Use: No  . Sexual Activity: Not Asked   Other Topics Concern  . None   Social History Narrative   No Known Allergies Family History  Problem Relation Age of Onset  . Heart Problems Father   . Heart disease Father   . Sudden death Father     Past medical history, social, surgical and family history all reviewed in electronic medical record.  No pertanent information unless stated regarding to the chief complaint.   Review of Systems: No headache, visual changes, nausea, vomiting,  diarrhea, constipation, dizziness, abdominal pain, skin rash, fevers, chills, night sweats, weight loss, swollen lymph nodes, body aches, joint swelling, muscle aches, chest pain, shortness of breath, mood changes.   Objective Blood pressure 130/86, pulse 60, height 5' 4.57" (1.64 m), weight 197 lb (89.359 kg), SpO2 98 %.  General: No apparent distress alert and oriented x3 mood and affect normal, dressed appropriately.  HEENT: Pupils equal, extraocular movements intact  Respiratory: Patient's speak in full sentences and does not appear short of breath  Cardiovascular: No lower extremity edema, non tender, no erythema  Skin: Warm dry intact with no signs of infection or rash on extremities or on axial  skeleton. Dry skin noted Abdomen: Soft nontender  Neuro: Cranial nerves II through XII are intact, neurovascularly intact in all extremities with 2+ DTRs and 2+ pulses.  Lymph: No lymphadenopathy of posterior or anterior cervical chain or axillae bilaterally.  Gait antalgic gait MSK:  Non tender with full range of motion and good stability and symmetric strength and tone of  elbows, wrist, hip, and ankles bilaterally. Severe arthritic changes of multiple joints.  Shoulder: Bilateral Inspection reveals significant atrophy of the musculature of the shoulder girdle bilaterally Palpation is normal with no tenderness over AC joint or bicipital groove. ROM is restricted to 110 of 4 flexion bilaterally with significant crepitus in patient has to stop secondary to pain. Rotator cuff strength 3 out of 5 bilaterally. signs of impingement with positive Neer and Hawkin's tests, but negative empty can sign. Speeds and Yergason's tests normal.. Positive drop arm sign.   Knee: Bilateral Severe varus deformity of the knees bilaterally with standing. Severe tenderness to palpation over the medial joint lines bilaterally.Marland Kitchen ROM full in flexion and extension and lower leg rotation. Instability noted with valgus force Negative Mcmurray's, Apley's, and Thessalonian tests. painful patellar compression. Patellar glide severe crepitus. Patellar and quadriceps tendons unremarkable. Hamstring and quadriceps strength is normal.   After informed written and verbal consent, patient was seated on exam table. Right knee was prepped with alcohol swab and utilizing anterolateral approach, patient's right knee space was injected with 4:1  marcaine 0.5%: Kenalog 40mg /dL. Patient tolerated the procedure well without immediate complications.  After informed written and verbal consent, patient was seated on exam table. Left knee was prepped with alcohol swab and utilizing anterolateral approach, patient's left knee space  was injected with 4:1  marcaine 0.5%: Kenalog 40mg /dL. Patient tolerated the procedure well without immediate complications.    Impression and Recommendations:     This case required medical decision making of moderate complexity.      Note: This dictation was prepared with Dragon dictation along with smaller phrase technology. Any transcriptional errors that result from this process are unintentional.

## 2016-03-07 ENCOUNTER — Ambulatory Visit: Payer: Medicare HMO | Admitting: Family Medicine

## 2016-03-28 ENCOUNTER — Ambulatory Visit: Payer: Medicare HMO | Admitting: Family Medicine

## 2016-04-17 ENCOUNTER — Other Ambulatory Visit: Payer: Self-pay | Admitting: *Deleted

## 2016-04-17 DIAGNOSIS — I10 Essential (primary) hypertension: Secondary | ICD-10-CM

## 2016-04-17 MED ORDER — LISINOPRIL 20 MG PO TABS: ORAL_TABLET | ORAL | Status: DC

## 2016-04-29 ENCOUNTER — Ambulatory Visit: Payer: Medicare HMO | Admitting: Family Medicine

## 2016-12-29 ENCOUNTER — Other Ambulatory Visit: Payer: Self-pay | Admitting: Adult Health

## 2016-12-29 DIAGNOSIS — I10 Essential (primary) hypertension: Secondary | ICD-10-CM

## 2016-12-30 NOTE — Telephone Encounter (Signed)
Ok to refill for 30 days. He is due for a physical

## 2016-12-31 ENCOUNTER — Other Ambulatory Visit: Payer: Self-pay | Admitting: Adult Health

## 2016-12-31 DIAGNOSIS — I10 Essential (primary) hypertension: Secondary | ICD-10-CM

## 2017-01-21 DIAGNOSIS — M129 Arthropathy, unspecified: Secondary | ICD-10-CM | POA: Diagnosis not present

## 2017-01-21 DIAGNOSIS — G8929 Other chronic pain: Secondary | ICD-10-CM | POA: Diagnosis not present

## 2017-01-21 DIAGNOSIS — M2550 Pain in unspecified joint: Secondary | ICD-10-CM | POA: Diagnosis not present

## 2017-01-30 DIAGNOSIS — M25561 Pain in right knee: Secondary | ICD-10-CM | POA: Diagnosis not present

## 2017-01-30 DIAGNOSIS — M25562 Pain in left knee: Secondary | ICD-10-CM | POA: Diagnosis not present

## 2017-01-30 DIAGNOSIS — M25461 Effusion, right knee: Secondary | ICD-10-CM | POA: Diagnosis not present

## 2017-01-30 DIAGNOSIS — M25462 Effusion, left knee: Secondary | ICD-10-CM | POA: Diagnosis not present

## 2017-01-30 DIAGNOSIS — M25511 Pain in right shoulder: Secondary | ICD-10-CM | POA: Diagnosis not present

## 2017-02-12 ENCOUNTER — Telehealth: Payer: Self-pay | Admitting: Adult Health

## 2017-02-12 NOTE — Telephone Encounter (Signed)
Pt state that he had requested a 3 month supply for metoprolol and only recd #90 he takes 3 pills a day and he need to have #180 more to fulfill his previous request. Pharm:  [facility] on [address]  Pt only has 3 pills left.

## 2017-02-13 NOTE — Telephone Encounter (Signed)
This medication can only be filled for a 30 day supply until he is seen for a physical. Would you mind calling patient and getting them scheduled?

## 2017-02-13 NOTE — Telephone Encounter (Addendum)
LMOVM to call back.  Pt needs a cpe, not seen since 10/2015.

## 2017-02-14 ENCOUNTER — Other Ambulatory Visit: Payer: Self-pay

## 2017-02-14 DIAGNOSIS — I10 Essential (primary) hypertension: Secondary | ICD-10-CM

## 2017-02-14 MED ORDER — METOPROLOL TARTRATE 50 MG PO TABS: ORAL_TABLET | ORAL | 0 refills | Status: DC

## 2017-02-14 NOTE — Telephone Encounter (Signed)
Per Tommi Rumps, it is okay to send in for 30 day supply - and has been sent in for a 30 day supply.  Patient notified that he will need to see [facility] asap to make sure that his medications are being managed properly.

## 2017-02-14 NOTE — Telephone Encounter (Addendum)
Pt states he has had a financial burden tand he has not been able to come into the office for an appt due to having no money. Pt would like to know if ok to do an annual well visit and then cory can call in a 30 day asap. Pt has AWV on 3/28 at 2 pm  metoprolol (LOPRESSOR) 50 MG tablet  [facility] [place], [address]

## 2017-03-05 ENCOUNTER — Telehealth: Payer: Self-pay

## 2017-03-05 ENCOUNTER — Ambulatory Visit (INDEPENDENT_AMBULATORY_CARE_PROVIDER_SITE_OTHER): Payer: Medicare HMO

## 2017-03-05 VITALS — BP 124/60 | Temp 98.0°F | Ht 65.5 in | Wt 195.4 lb

## 2017-03-05 DIAGNOSIS — Z0000 Encounter for general adult medical examination without abnormal findings: Secondary | ICD-10-CM

## 2017-03-05 NOTE — Patient Instructions (Addendum)
Clinton [name] , Thank you for taking time to come for your Medicare Wellness Visit. I appreciate your ongoing commitment to your health goals. Please review the following plan we discussed and let me know if I can assist you in the future.   Will review information on the cologuard  Declines colonoscopy for now  Therefore, Medicare Part B will cover the CologuardTM test once every three years for beneficiaries who meet all of the following criteria:  Age 75 to 64 years,  Asymptomatic (no signs or symptoms of colorectal disease including but not limited to lower gastrointestinal pain, blood in stool, positive guaiac fecal occult blood test or fecal immunochemical test), and  At average risk of developing colorectal cancer (no personal history of adenomatous polyps, colorectal cancer, or inflammatory bowel disease, including Crohn's Disease and ulcerative colitis; no family history of colorectal cancers or adenomatous polyps, familial adenomatous polyposis, or hereditary nonpolyposis colorectal cancer).   Will try to have an eye exam this year     [employer]; [phone] [facility] [place]; [phone] Get resource to get information on any and all community programs for [employer]: 3656127722 [facility] Response Program -638-756-4332 Public Health Dept; Need to be a skilled visit but can assist with bathing as well; ([phone]  http://[email or web address].[email or web address]   Deaf & Hard of Hearing Division Services - can assist with hearing aid x 1  No reviews  [facility]  [place] [address]  [phone]     These are the goals we discussed: Goals    . Increase physical activity    . Prevent Falls       This is a list of the screening recommended for you and due dates:  Health Maintenance  Topic Date Due  . Colon Cancer Screening  01/29/1992  . Pneumonia vaccines (1 of 2 - PCV13) 01/28/2007  . Flu Shot  08/07/2035*  . Tetanus Vaccine  12/09/2018   *Topic was postponed. The date shown is not the original due date.        Fall Prevention in the Home Falls can cause injuries. They can happen to people of all ages. There are many things you can do to make your home safe and to help prevent falls. What can I do on the outside of my home?  Regularly fix the edges of walkways and driveways and fix any cracks.  Remove anything that might make you trip as you walk through a door, such as a raised step or threshold.  Trim any bushes or trees on the path to your home.  Use bright outdoor lighting.  Clear any walking paths of anything that might make someone trip, such as rocks or tools.  Regularly check to see if handrails are loose or broken. Make sure that both sides of any steps have handrails.  Any raised decks and porches should have guardrails on the edges.  Have any leaves, snow, or ice cleared regularly.  Use sand or salt on walking paths during winter.  Clean up any spills in your garage right away. This includes oil or grease spills. What can I do in the bathroom?  Use night lights.  Install grab bars by the toilet and in the tub and shower. Do not use towel bars as grab bars.  Use non-skid mats or decals in the tub or shower.  If you need to sit down in the shower, use a plastic, non-slip stool.  Keep the floor dry. Clean up any  water that spills on the floor as soon as it happens.  Remove soap buildup in the tub or shower regularly.  Attach bath mats securely with double-sided non-slip rug tape.  Do not have throw rugs and other things on the floor that can make you trip. What can I do in the bedroom?  Use night lights.  Make sure that you have a light by your bed that is easy to reach.  Do not use any sheets or blankets that are too big for your bed. They should not hang down onto the floor.  Have a firm chair that has side arms. You can use this for support while you get dressed.  Do not have throw  rugs and other things on the floor that can make you trip. What can I do in the kitchen?  Clean up any spills right away.  Avoid walking on wet floors.  Keep items that you use a lot in easy-to-reach places.  If you need to reach something above you, use a strong step stool that has a grab bar.  Keep electrical cords out of the way.  Do not use floor polish or wax that makes floors slippery. If you must use wax, use non-skid floor wax.  Do not have throw rugs and other things on the floor that can make you trip. What can I do with my stairs?  Do not leave any items on the stairs.  Make sure that there are handrails on both sides of the stairs and use them. Fix handrails that are broken or loose. Make sure that handrails are as long as the stairways.  Check any carpeting to make sure that it is firmly attached to the stairs. Fix any carpet that is loose or worn.  Avoid having throw rugs at the top or bottom of the stairs. If you do have throw rugs, attach them to the floor with carpet tape.  Make sure that you have a light switch at the top of the stairs and the bottom of the stairs. If you do not have them, ask someone to add them for you. What else can I do to help prevent falls?  Wear shoes that:  Do not have high heels.  Have rubber bottoms.  Are comfortable and fit you well.  Are closed at the toe. Do not wear sandals.  If you use a stepladder:  Make sure that it is fully opened. Do not climb a closed stepladder.  Make sure that both sides of the stepladder are locked into place.  Ask someone to hold it for you, if possible.  Clearly mark and make sure that you can see:  Any grab bars or handrails.  First and last steps.  Where the edge of each step is.  Use tools that help you move around (mobility aids) if they are needed. These include:  Canes.  Walkers.  Scooters.  Crutches.  Turn on the lights when you go into a dark area. Replace any light  bulbs as soon as they burn out.  Set up your furniture so you have a clear path. Avoid moving your furniture around.  If any of your floors are uneven, fix them.  If there are any pets around you, be aware of where they are.  Review your medicines with your doctor. Some medicines can make you feel dizzy. This can increase your chance of falling. Ask your doctor what other things that you can do to help prevent falls. This information  is not intended to replace advice given to you by your health care provider. Make sure you discuss any questions you have with your health care provider. Document Released: 09/21/2009 Document Revised: 05/02/2016 Document Reviewed: 12/30/2014 Elsevier Interactive Patient Education  2017 [place] Maintenance, Male A healthy lifestyle and preventive care is important for your health and wellness. Ask your health care provider about what schedule of regular examinations is right for you. What should I know about weight and diet?  Eat a Healthy Diet  Eat plenty of vegetables, fruits, whole grains, low-fat dairy products, and lean protein.  Do not eat a lot of foods high in solid fats, added sugars, or salt. Maintain a Healthy Weight  Regular exercise can help you achieve or maintain a healthy weight. You should:  Do at least 150 minutes of exercise each week. The exercise should increase your heart rate and make you sweat (moderate-intensity exercise).  Do strength-training exercises at least twice a week. Watch Your Levels of Cholesterol and Blood Lipids  Have your blood tested for lipids and cholesterol every 5 years starting at 75 years of age. If you are at high risk for heart disease, you should start having your blood tested when you are 75 years old. You may need to have your cholesterol levels checked more often if:  Your lipid or cholesterol levels are high.  You are older than 75 years of age.  You are at high risk for heart  disease. What should I know about cancer screening? Many types of cancers can be detected early and may often be prevented. Lung Cancer  You should be screened every year for lung cancer if:  You are a current smoker who has smoked for at least 30 years.  You are a former smoker who has quit within the past 15 years.  Talk to your health care provider about your screening options, when you should start screening, and how often you should be screened. Colorectal Cancer  Routine colorectal cancer screening usually begins at 75 years of age and should be repeated every 5-10 years until you are 75 years old. You may need to be screened more often if early forms of precancerous polyps or small growths are found. Your health care provider may recommend screening at an earlier age if you have risk factors for colon cancer.  Your health care provider may recommend using home test kits to check for hidden blood in the stool.  A small camera at the end of a tube can be used to examine your colon (sigmoidoscopy or colonoscopy). This checks for the earliest forms of colorectal cancer. Prostate and Testicular Cancer  Depending on your age and overall health, your health care provider may do certain tests to screen for prostate and testicular cancer.  Talk to your health care provider about any symptoms or concerns you have about testicular or prostate cancer. Skin Cancer  Check your skin from head to toe regularly.  Tell your health care provider about any new moles or changes in moles, especially if:  There is a change in a mole's size, shape, or color.  You have a mole that is larger than a pencil eraser.  Always use sunscreen. Apply sunscreen liberally and repeat throughout the day.  Protect yourself by wearing long sleeves, pants, a wide-brimmed hat, and sunglasses when outside. What should I know about heart disease, diabetes, and high blood pressure?  If you are 43-62 years of age,  have  your blood pressure checked every 3-5 years. If you are 27 years of age or older, have your blood pressure checked every year. You should have your blood pressure measured twice-once when you are at a hospital or clinic, and once when you are not at a hospital or clinic. Record the average of the two measurements. To check your blood pressure when you are not at a hospital or clinic, you can use:  An automated blood pressure machine at a pharmacy.  A home blood pressure monitor.  Talk to your health care provider about your target blood pressure.  If you are between 13-19 years old, ask your health care provider if you should take aspirin to prevent heart disease.  Have regular diabetes screenings by checking your fasting blood sugar level.  If you are at a normal weight and have a low risk for diabetes, have this test once every three years after the age of 101.  If you are overweight and have a high risk for diabetes, consider being tested at a younger age or more often.  A one-time screening for abdominal aortic aneurysm (AAA) by ultrasound is recommended for men aged 54-75 years who are current or former smokers. What should I know about preventing infection? Hepatitis B  If you have a higher risk for hepatitis B, you should be screened for this virus. Talk with your health care provider to find out if you are at risk for hepatitis B infection. Hepatitis C  Blood testing is recommended for:  Everyone born from [birth year] through 1964.  Anyone with known risk factors for hepatitis C. Sexually Transmitted Diseases (STDs)  You should be screened each year for STDs including gonorrhea and chlamydia if:  You are sexually active and are younger than 75 years of age.  You are older than 75 years of age and your health care provider tells you that you are at risk for this type of infection.  Your sexual activity has changed since you were last screened and you are at an increased risk for  chlamydia or gonorrhea. Ask your health care provider if you are at risk.  Talk with your health care provider about whether you are at high risk of being infected with HIV. Your health care provider may recommend a prescription medicine to help prevent HIV infection. What else can I do?  Schedule regular health, dental, and eye exams.  Stay current with your vaccines (immunizations).  Do not use any tobacco products, such as cigarettes, chewing tobacco, and e-cigarettes. If you need help quitting, ask your health care provider.  Limit alcohol intake to no more than 2 drinks per day. One drink equals 12 ounces of beer, 5 ounces of wine, or 1 ounces of hard liquor.  Do not use street drugs.  Do not share needles.  Ask your health care provider for help if you need support or information about quitting drugs.  Tell your health care provider if you often feel depressed.  Tell your health care provider if you have ever been abused or do not feel safe at home. This information is not intended to replace advice given to you by your health care provider. Make sure you discuss any questions you have with your health care provider. Document Released: 05/23/2008 Document Revised: 07/24/2016 Document Reviewed: 08/29/2015 Elsevier Interactive Patient Education  2017 [employer]   Hearing Loss Hearing loss is a partial or total loss of the ability to hear. This can be temporary or permanent, and  it can happen in one or both ears. Hearing loss may be referred to as deafness. Medical care is necessary to treat hearing loss properly and to prevent the condition from getting worse. Your hearing may partially or completely come back, depending on what caused your hearing loss and how severe it is. In some cases, hearing loss is permanent. What are the causes? Common causes of hearing loss include:  Too much wax in the ear canal.  Infection of the ear canal or middle ear.  Fluid in the middle  ear.  Injury to the ear or surrounding area.  An object stuck in the ear.  Prolonged exposure to loud sounds, such as music. Less common causes of hearing loss include:  Tumors in the ear.  Viral or bacterial infections, such as meningitis.  A hole in the eardrum (perforated eardrum).  Problems with the hearing nerve that sends signals between the brain and the ear.  Certain medicines. What are the signs or symptoms? Symptoms of this condition may include:  Difficulty telling the difference between sounds.  Difficulty following a conversation when there is background noise.  Lack of response to sounds in your environment. This may be most noticeable when you do not respond to startling sounds.  Needing to turn up the volume on the television, radio, etc.  Ringing in the ears.  Dizziness.  Pain in the ears. How is this diagnosed? This condition is diagnosed based on a physical exam and a hearing test (audiometry). The audiometry test will be performed by a hearing specialist (audiologist). You may also be referred to an ear, nose, and throat (ENT) specialist (otolaryngologist). How is this treated? Treatment for recent onset of hearing loss may include:  Ear wax removal.  Being prescribed medicines to prevent infection (antibiotics).  Being prescribed medicines to reduce inflammation (corticosteroids). Follow these instructions at home:  If you were prescribed an antibiotic medicine, take it as told by your health care provider. Do not stop taking the antibiotic even if you start to feel better.  Take over-the-counter and prescription medicines only as told by your health care provider.  Avoid loud noises.  Return to your normal activities as told by your health care provider. Ask your health care provider what activities are safe for you.  Keep all follow-up visits as told by your health care provider. This is important. Contact a health care provider if:  You  feel dizzy.  You develop new symptoms.  You vomit or feel nauseous.  You have a fever. Get help right away if:  You develop sudden changes in your vision.  You have severe ear pain.  You have new or increased weakness.  You have a severe headache. This information is not intended to replace advice given to you by your health care provider. Make sure you discuss any questions you have with your health care provider. Document Released: 11/25/2005 Document Revised: 05/02/2016 Document Reviewed: 04/12/2015 Elsevier Interactive Patient Education  2017 [employer].
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
## 2017-03-05 NOTE — Telephone Encounter (Signed)
Please advise 
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
## 2017-03-06 ENCOUNTER — Other Ambulatory Visit: Payer: Self-pay

## 2017-03-06 DIAGNOSIS — I10 Essential (primary) hypertension: Secondary | ICD-10-CM

## 2017-03-06 MED ORDER — METOPROLOL TARTRATE 50 MG PO TABS: ORAL_TABLET | ORAL | 1 refills | Status: DC

## 2017-03-06 MED ORDER — LISINOPRIL 20 MG PO TABS: ORAL_TABLET | ORAL | 0 refills | Status: DC

## 2017-03-06 NOTE — Progress Notes (Signed)
I have reviewed and agree with this plan  

## 2017-03-06 NOTE — Telephone Encounter (Signed)
Ok to refill for 90 days  

## 2017-03-06 NOTE — Telephone Encounter (Signed)
Prescriptions have been refilled until appt. Thanks!

## 2017-03-28 ENCOUNTER — Ambulatory Visit: Payer: Medicare HMO | Admitting: Adult Health

## 2017-07-08 ENCOUNTER — Other Ambulatory Visit: Payer: Self-pay | Admitting: Adult Health

## 2017-07-08 DIAGNOSIS — I10 Essential (primary) hypertension: Secondary | ICD-10-CM

## 2017-07-08 MED ORDER — LISINOPRIL 20 MG PO TABS: ORAL_TABLET | ORAL | 1 refills | Status: DC

## 2017-07-17 ENCOUNTER — Other Ambulatory Visit: Payer: Self-pay | Admitting: Adult Health

## 2017-07-17 DIAGNOSIS — I10 Essential (primary) hypertension: Secondary | ICD-10-CM

## 2017-07-18 NOTE — Telephone Encounter (Signed)
Denied.  Pt must have an office visit for refills of this medication.

## 2017-08-20 ENCOUNTER — Ambulatory Visit (INDEPENDENT_AMBULATORY_CARE_PROVIDER_SITE_OTHER): Payer: Medicare HMO | Admitting: Adult Health

## 2017-08-20 ENCOUNTER — Encounter: Payer: Self-pay | Admitting: Adult Health

## 2017-08-20 ENCOUNTER — Other Ambulatory Visit: Payer: Self-pay | Admitting: Adult Health

## 2017-08-20 DIAGNOSIS — I10 Essential (primary) hypertension: Secondary | ICD-10-CM

## 2017-08-20 MED ORDER — METOPROLOL TARTRATE 50 MG PO TABS: ORAL_TABLET | ORAL | 0 refills | Status: DC

## 2017-08-20 NOTE — Progress Notes (Signed)
Subjective:    Patient ID: [name], male    DOB: [birth year], 76 y.o.   MRN: [id number]  HPI  76 year old male who  has a past medical history of Anxiety; Arthritis; Coronary artery disease; Erectile dysfunction; Frozen shoulder; GERD (gastroesophageal reflux disease); History of elevated lipids; Hypertension; Osteoarthritis; and Spinal stenosis (11/24/2014). He presents to the office today for medication management. He needs a refill of Metoprolol 50 mg. He reports that he has fallen on hard times and is having a hard time being able to afford his medications this month. He would only like a thirty day supply.   He has no other acute complaints. He reports that " I am feeling pretty good."    Review of Systems  See HPI    Past Medical History:  Diagnosis Date  . Anxiety   . Arthritis   . Coronary artery disease    have an appointment with Dr Johnsie Cancel 11/15/14  . Erectile dysfunction   . Frozen shoulder   . GERD (gastroesophageal reflux disease)   . History of elevated lipids   . Hypertension   . Osteoarthritis   . Spinal stenosis 11/24/2014    Social History   Social History  . Marital status: Married    Spouse name: N/A  . Number of children: N/A  . Years of education: N/A   Occupational History  . Not on file.   Social History Main Topics  . Smoking status: Former Smoker    Packs/day: 0.50    Years: 10.00    Types: Cigarettes    Quit date: 11/11/1974  . Smokeless tobacco: Never Used     Comment: age 47 yrs  . Alcohol use Yes     Comment: occasionally  . Drug use: No  . Sexual activity: Not on file   Other Topics Concern  . Not on file   Social History Narrative  . No narrative on file    Past Surgical History:  Procedure Laterality Date  . CARDIAC SURGERY     2 stents placed   . CORONARY ANGIOPLASTY     mid and proximal LAD stent 09/1999 [facility])  . posterior lumbar fusion L4-5  11-24-14    Family History  Problem Relation  Age of Onset  . Heart Problems Father   . Heart disease Father   . Sudden death Father     No Known Allergies  Current Outpatient Prescriptions on File Prior to Visit  Medication Sig Dispense Refill  . acetaminophen (TYLENOL) 500 MG tablet Take 500 mg by mouth every 6 (six) hours as needed.    . calcium carbonate (TUMS EX) 750 MG chewable tablet Chew 1 tablet by mouth daily as needed for heartburn.    Marland Kitchen lisinopril (PRINIVIL,ZESTRIL) 20 MG tablet TK 1 T PO QD 90 tablet 1  . ranitidine (ZANTAC) 150 MG tablet Take 150 mg by mouth daily as needed for heartburn.     No current facility-administered medications on file prior to visit.     BP 122/80   Pulse 63   Temp 98 F (36.7 C) (Oral)   Wt 190 lb 9.6 oz (86.5 kg)   SpO2 98%   BMI 31.24 kg/m        Objective:   Physical Exam  Constitutional: He is oriented to person, place, and time. He appears well-developed and well-nourished. No distress.  Cardiovascular: Normal rate, regular rhythm, normal heart sounds and intact distal pulses.  Exam reveals  no gallop and no friction rub.   No murmur heard. Pulmonary/Chest: Effort normal and breath sounds normal. No respiratory distress. He has no wheezes. He has no rales. He exhibits no tenderness.  Musculoskeletal:  Walks with a slow steady gate with rolling walker.   Neurological: He is alert and oriented to person, place, and time.  Skin: Skin is warm and dry. No rash noted. He is not diaphoretic. No erythema. No pallor.  Psychiatric: He has a normal mood and affect. His behavior is normal. Judgment and thought content normal.  Nursing note and vitals reviewed.     Assessment & Plan:  1. Essential hypertension - metoprolol tartrate (LOPRESSOR) 50 MG tablet; TAKE 2 TABLETS BY MOUTH IN THE MORNING, AND 1 TABLET BY MOUTH IN THE EVENING.  Dispense: 90 tablet; Refill: 0   [name], NP

## 2017-08-21 NOTE — Telephone Encounter (Signed)
He wanted a 30 day supply yesterday due to cost

## 2017-08-21 NOTE — Telephone Encounter (Signed)
You only sent rx for 30 days yesterday, ok to fill for 90 days?

## 2017-08-21 NOTE — Telephone Encounter (Signed)
I attempted to contact patient. LMTCB

## 2017-08-25 NOTE — Telephone Encounter (Signed)
I contacted pharmacy this am.  Pharmacist stated rx was filled and ready for pick up.

## 2017-09-23 ENCOUNTER — Other Ambulatory Visit: Payer: Self-pay | Admitting: Adult Health

## 2017-09-23 DIAGNOSIS — I10 Essential (primary) hypertension: Secondary | ICD-10-CM

## 2017-09-25 NOTE — Telephone Encounter (Signed)
Can we see if he is able to afford a 90 days supply? He wasn't the last time I saw him and had to prescribe 30 days

## 2017-09-25 NOTE — Telephone Encounter (Signed)
Sent to the pharmacy with a message to fill for 30 days if pt requests.

## 2017-09-25 NOTE — Telephone Encounter (Signed)
Pt seen recently.  No recent labs.  Please advise.

## 2017-11-07 ENCOUNTER — Ambulatory Visit: Payer: Medicare HMO | Admitting: Adult Health

## 2017-11-11 ENCOUNTER — Ambulatory Visit: Payer: Medicare HMO | Admitting: Adult Health

## 2017-12-12 ENCOUNTER — Other Ambulatory Visit: Payer: Self-pay | Admitting: Adult Health

## 2017-12-12 DIAGNOSIS — I10 Essential (primary) hypertension: Secondary | ICD-10-CM

## 2017-12-16 ENCOUNTER — Other Ambulatory Visit: Payer: Self-pay | Admitting: Adult Health

## 2017-12-16 DIAGNOSIS — I10 Essential (primary) hypertension: Secondary | ICD-10-CM

## 2017-12-16 NOTE — Telephone Encounter (Signed)
Sent to the pharmacy by e-scribe for 30 days.  Pt has not had cpx or lab work since 2016.  Tommi Rumps currently out of the office.  Will check with [facility] before refilling future scripts.

## 2017-12-18 ENCOUNTER — Other Ambulatory Visit: Payer: Self-pay | Admitting: Adult Health

## 2017-12-18 DIAGNOSIS — I10 Essential (primary) hypertension: Secondary | ICD-10-CM

## 2017-12-23 NOTE — Telephone Encounter (Signed)
Yes, I would like him to come in for a physical. Ok to refill for 30 days

## 2017-12-23 NOTE — Telephone Encounter (Signed)
Left a message for a return call.

## 2017-12-23 NOTE — Telephone Encounter (Signed)
When do you want to see pt for cpx?  No lab work since 2016.

## 2017-12-31 ENCOUNTER — Other Ambulatory Visit: Payer: Self-pay | Admitting: Adult Health

## 2017-12-31 DIAGNOSIS — I10 Essential (primary) hypertension: Secondary | ICD-10-CM

## 2017-12-31 NOTE — Telephone Encounter (Signed)
Sent to the pharmacy by e-scribe.  Pt has appt on 01/06/18

## 2018-01-06 ENCOUNTER — Ambulatory Visit (INDEPENDENT_AMBULATORY_CARE_PROVIDER_SITE_OTHER): Payer: Medicare HMO | Admitting: Adult Health

## 2018-01-06 ENCOUNTER — Encounter: Payer: Self-pay | Admitting: Adult Health

## 2018-01-06 DIAGNOSIS — I10 Essential (primary) hypertension: Secondary | ICD-10-CM

## 2018-01-06 MED ORDER — METOPROLOL TARTRATE 50 MG PO TABS: ORAL_TABLET | ORAL | 0 refills | Status: DC

## 2018-01-06 MED ORDER — LISINOPRIL 20 MG PO TABS: ORAL_TABLET | ORAL | 0 refills | Status: DC

## 2018-01-06 NOTE — Progress Notes (Signed)
Subjective:    Patient ID: Clinton [name], male    DOB: 1941/05/22, 77 y.o.   MRN: [id number]  HPI  77 year male who  has a past medical history of Anxiety, Arthritis, Coronary artery disease, Erectile dysfunction, Frozen shoulder, GERD (gastroesophageal reflux disease), History of elevated lipids, Hypertension, Osteoarthritis, and Spinal stenosis (11/24/2014).  He presents to the office today for medication refill. He needs to have his Metoprolol and Lisinopril filled.   He denies any acute issues   Review of Systems See HPI   Past Medical History:  Diagnosis Date  . Anxiety   . Arthritis   . Coronary artery disease    have an appointment with Dr Johnsie Cancel 11/15/14  . Erectile dysfunction   . Frozen shoulder   . GERD (gastroesophageal reflux disease)   . History of elevated lipids   . Hypertension   . Osteoarthritis   . Spinal stenosis 11/24/2014    Social History   Socioeconomic History  . Marital status: Married    Spouse name: Not on file  . Number of children: Not on file  . Years of education: Not on file  . Highest education level: Not on file  Social Needs  . Financial resource strain: Not on file  . Food insecurity - worry: Not on file  . Food insecurity - inability: Not on file  . Transportation needs - medical: Not on file  . Transportation needs - non-medical: Not on file  Occupational History  . Not on file  Tobacco Use  . Smoking status: Former Smoker    Packs/day: 0.50    Years: 10.00    Pack years: 5.00    Types: Cigarettes    Last attempt to quit: 11/11/1974    Years since quitting: 43.1  . Smokeless tobacco: Never Used  . Tobacco comment: age 40 yrs  Substance and Sexual Activity  . Alcohol use: Yes    Comment: occasionally  . Drug use: No  . Sexual activity: Not on file  Other Topics Concern  . Not on file  Social History Narrative  . Not on file    Past Surgical History:  Procedure Laterality Date  . CARDIAC SURGERY     2  stents placed   . CORONARY ANGIOPLASTY     mid and proximal LAD stent 09/1999 [facility])  . posterior lumbar fusion L4-5  11-24-14    Family History  Problem Relation Age of Onset  . Heart Problems Father   . Heart disease Father   . Sudden death Father     No Known Allergies  Current Outpatient Medications on File Prior to Visit  Medication Sig Dispense Refill  . aspirin 81 MG chewable tablet Chew by mouth daily.    . calcium elemental as carbonate (BARIATRIC TUMS ULTRA) 400 MG chewable tablet Chew 1,000 mg by mouth 3 (three) times daily.    . ranitidine (ZANTAC) 150 MG tablet Take 150 mg by mouth daily as needed for heartburn.     No current facility-administered medications on file prior to visit.     BP 130/78   Temp (!) 97.3 F (36.3 C) (Oral)       Objective:   Physical Exam  Constitutional: He appears well-developed and well-nourished. No distress.  Cardiovascular: Normal rate, regular rhythm, normal heart sounds and intact distal pulses. Exam reveals no gallop and no friction rub.  No murmur heard. Pulmonary/Chest: Effort normal and breath sounds normal. No respiratory distress. He has  no wheezes. He has no rales. He exhibits no tenderness.  Musculoskeletal:  Has slow steady gait with rolling walker   Neurological: He is alert.  Skin: Skin is warm and dry. No rash noted. He is not diaphoretic. No erythema. No pallor.  Psychiatric: He has a normal mood and affect. His behavior is normal. Judgment and thought content normal.  Nursing note and vitals reviewed.     Assessment & Plan:  1. Essential hypertension - HTN well controlled  - Will refill for 90 days. He is overdue for physical. He will schedule this today  - metoprolol tartrate (LOPRESSOR) 50 MG tablet; TAKE 2 TABLETS BY MOUTH EVERY MORNING AND 1 TABLET EVERY EVENING  Dispense: 270 tablet; Refill: 0 - lisinopril (PRINIVIL,ZESTRIL) 20 MG tablet; TAKE 1 TABLET BY MOUTH EVERY DAY  Dispense: 90  tablet; Refill: 0  [name], AGNP

## 2018-04-03 ENCOUNTER — Ambulatory Visit: Payer: Medicare HMO | Admitting: Adult Health

## 2018-04-07 ENCOUNTER — Encounter: Payer: Self-pay | Admitting: Adult Health

## 2018-04-07 ENCOUNTER — Ambulatory Visit (INDEPENDENT_AMBULATORY_CARE_PROVIDER_SITE_OTHER): Payer: Medicare HMO | Admitting: Adult Health

## 2018-04-07 DIAGNOSIS — I10 Essential (primary) hypertension: Secondary | ICD-10-CM

## 2018-04-07 MED ORDER — LISINOPRIL 20 MG PO TABS: ORAL_TABLET | ORAL | 0 refills | Status: DC

## 2018-04-07 NOTE — Patient Instructions (Addendum)
I have sent in your lisinopril to the pharmacy   I would be happy to send you to orthopedics for the weakness and immobility in the upper extremities. Please think about it and let me know at your physical next month

## 2018-04-07 NOTE — Progress Notes (Signed)
Subjective:    Patient ID: Clinton [name], male    DOB: 1941-10-15, 77 y.o.   MRN: [id number]  HPI 77 year old male who  has a past medical history of Anxiety, Arthritis, Coronary artery disease, Erectile dysfunction, Frozen shoulder, GERD (gastroesophageal reflux disease), History of elevated lipids, Hypertension, Osteoarthritis, and Spinal stenosis (11/24/2014).  Presents to the office today for follow-up regarding hypertension.  He needs a refill of lisinopril.  Reports good compliance with medications and blood pressure is well controlled.  Denies any acute issues.  He will follow-up in 1 week for his complete physical exam since his last one was 3 years ago.  Review of Systems See HPI   Past Medical History:  Diagnosis Date  . Anxiety   . Arthritis   . Coronary artery disease    have an appointment with Dr Johnsie Cancel 11/15/14  . Erectile dysfunction   . Frozen shoulder   . GERD (gastroesophageal reflux disease)   . History of elevated lipids   . Hypertension   . Osteoarthritis   . Spinal stenosis 11/24/2014    Social History   Socioeconomic History  . Marital status: Married    Spouse name: Not on file  . Number of children: Not on file  . Years of education: Not on file  . Highest education level: Not on file  Occupational History  . Not on file  Social Needs  . Financial resource strain: Not on file  . Food insecurity:    Worry: Not on file    Inability: Not on file  . Transportation needs:    Medical: Not on file    Non-medical: Not on file  Tobacco Use  . Smoking status: Former Smoker    Packs/day: 0.50    Years: 10.00    Pack years: 5.00    Types: Cigarettes    Last attempt to quit: 11/11/1974    Years since quitting: 43.4  . Smokeless tobacco: Never Used  . Tobacco comment: age 72 yrs  Substance and Sexual Activity  . Alcohol use: Yes    Comment: occasionally  . Drug use: No  . Sexual activity: Not on file  Lifestyle  . Physical activity:   Days per week: Not on file    Minutes per session: Not on file  . Stress: Not on file  Relationships  . Social connections:    Talks on phone: Not on file    Gets together: Not on file    Attends religious service: Not on file    Active member of club or organization: Not on file    Attends meetings of clubs or organizations: Not on file    Relationship status: Not on file  . Intimate partner violence:    Fear of current or ex partner: Not on file    Emotionally abused: Not on file    Physically abused: Not on file    Forced sexual activity: Not on file  Other Topics Concern  . Not on file  Social History Narrative  . Not on file    Past Surgical History:  Procedure Laterality Date  . CARDIAC SURGERY     2 stents placed   . CORONARY ANGIOPLASTY     mid and proximal LAD stent 09/1999 [facility])  . posterior lumbar fusion L4-5  11-24-14    Family History  Problem Relation Age of Onset  . Heart Problems Father   . Heart disease Father   . Sudden death  Father     No Known Allergies  Current Outpatient Medications on File Prior to Visit  Medication Sig Dispense Refill  . aspirin 81 MG chewable tablet Chew by mouth daily.    . calcium elemental as carbonate (BARIATRIC TUMS ULTRA) 400 MG chewable tablet Chew 1,000 mg by mouth 3 (three) times daily.    . metoprolol tartrate (LOPRESSOR) 50 MG tablet TAKE 2 TABLETS BY MOUTH EVERY MORNING AND 1 TABLET EVERY EVENING 270 tablet 0  . lisinopril (PRINIVIL,ZESTRIL) 20 MG tablet TAKE 1 TABLET BY MOUTH EVERY DAY (Patient not taking: Reported on 04/07/2018) 90 tablet 0  . ranitidine (ZANTAC) 150 MG tablet Take 150 mg by mouth daily as needed for heartburn.     No current facility-administered medications on file prior to visit.     BP 114/66   Temp (!) 97.4 F (36.3 C) (Oral)   Wt 182 lb (82.6 kg)   BMI 29.83 kg/m       Objective:   Physical Exam  Constitutional: He is oriented to person, place, and time. He appears  well-developed and well-nourished. No distress.  Cardiovascular: Normal rate, regular rhythm, normal heart sounds and intact distal pulses. Exam reveals no gallop and no friction rub.  No murmur heard. Pulmonary/Chest: Effort normal and breath sounds normal. No stridor. No respiratory distress. He has no wheezes. He has no rales. He exhibits no tenderness.  Musculoskeletal:  Slow steady gait with walker.   Neurological: He is alert and oriented to person, place, and time.  Skin: Skin is warm and dry. He is not diaphoretic.  Nursing note and vitals reviewed.     Assessment & Plan:  1. Essential hypertension - lisinopril (PRINIVIL,ZESTRIL) 20 MG tablet; TAKE 1 TABLET BY MOUTH EVERY DAY  Dispense: 90 tablet; Refill: 0 - Will do lab work at [facility] in one week - Follow up as needed  [name], NP

## 2018-04-16 ENCOUNTER — Encounter: Payer: Self-pay | Admitting: Adult Health

## 2018-04-16 ENCOUNTER — Ambulatory Visit (INDEPENDENT_AMBULATORY_CARE_PROVIDER_SITE_OTHER): Payer: Medicare HMO | Admitting: Adult Health

## 2018-04-16 VITALS — BP 130/76 | Temp 98.4°F | Wt 184.0 lb

## 2018-04-16 DIAGNOSIS — D492 Neoplasm of unspecified behavior of bone, soft tissue, and skin: Secondary | ICD-10-CM | POA: Diagnosis not present

## 2018-04-16 DIAGNOSIS — M75100 Unspecified rotator cuff tear or rupture of unspecified shoulder, not specified as traumatic: Secondary | ICD-10-CM

## 2018-04-16 DIAGNOSIS — I10 Essential (primary) hypertension: Secondary | ICD-10-CM | POA: Diagnosis not present

## 2018-04-16 DIAGNOSIS — Z125 Encounter for screening for malignant neoplasm of prostate: Secondary | ICD-10-CM | POA: Diagnosis not present

## 2018-04-16 DIAGNOSIS — E782 Mixed hyperlipidemia: Secondary | ICD-10-CM | POA: Diagnosis not present

## 2018-04-16 DIAGNOSIS — Z0001 Encounter for general adult medical examination with abnormal findings: Secondary | ICD-10-CM

## 2018-04-16 DIAGNOSIS — I2510 Atherosclerotic heart disease of native coronary artery without angina pectoris: Secondary | ICD-10-CM | POA: Diagnosis not present

## 2018-04-16 DIAGNOSIS — Z0000 Encounter for general adult medical examination without abnormal findings: Secondary | ICD-10-CM

## 2018-04-16 LAB — LIPID PANEL
CHOL/HDL RATIO: 3
Cholesterol: 179 mg/dL (ref 0–200)
HDL: 52.5 mg/dL (ref >39.00)
LDL Cholesterol: 114 mg/dL — ABNORMAL HIGH (ref 0–99)
NONHDL: 126.65
Triglycerides: 63 mg/dL (ref 0.0–149.0)
VLDL: 12.6 mg/dL (ref 0.0–40.0)

## 2018-04-16 LAB — CBC WITH DIFFERENTIAL/PLATELET
BASOS ABS: 0 10*3/uL (ref 0.0–0.1)
Basophils Relative: 0.8 % (ref 0.0–3.0)
Eosinophils Absolute: 0.1 10*3/uL (ref 0.0–0.7)
Eosinophils Relative: 2.4 % (ref 0.0–5.0)
HEMATOCRIT: 44.2 % (ref 39.0–52.0)
HEMOGLOBIN: 14.9 g/dL (ref 13.0–17.0)
LYMPHS PCT: 22.1 % (ref 12.0–46.0)
Lymphs Abs: 1.2 10*3/uL (ref 0.7–4.0)
MCHC: 33.7 g/dL (ref 30.0–36.0)
MCV: 94.9 fl (ref 78.0–100.0)
MONOS PCT: 8.9 % (ref 3.0–12.0)
Monocytes Absolute: 0.5 10*3/uL (ref 0.1–1.0)
NEUTROS PCT: 65.8 % (ref 43.0–77.0)
Neutro Abs: 3.6 10*3/uL (ref 1.4–7.7)
Platelets: 187 10*3/uL (ref 150.0–400.0)
RBC: 4.66 Mil/uL (ref 4.22–5.81)
RDW: 13.8 % (ref 11.5–15.5)
WBC: 5.4 10*3/uL (ref 4.0–10.5)

## 2018-04-16 LAB — HEPATIC FUNCTION PANEL
ALK PHOS: 53 U/L (ref 39–117)
ALT: 9 U/L (ref 0–53)
AST: 13 U/L (ref 0–37)
Albumin: 4 g/dL (ref 3.5–5.2)
BILIRUBIN DIRECT: 0.2 mg/dL (ref 0.0–0.3)
TOTAL PROTEIN: 6.7 g/dL (ref 6.0–8.3)
Total Bilirubin: 0.8 mg/dL (ref 0.2–1.2)

## 2018-04-16 LAB — BASIC METABOLIC PANEL
BUN: 19 mg/dL (ref 6–23)
CO2: 26 mEq/L (ref 19–32)
Calcium: 9.4 mg/dL (ref 8.4–10.5)
Chloride: 105 mEq/L (ref 96–112)
Creatinine, Ser: 0.96 mg/dL (ref 0.40–1.50)
GFR: 80.89 mL/min (ref >60.00)
Glucose, Bld: 90 mg/dL (ref 70–99)
Potassium: 4.1 mEq/L (ref 3.5–5.1)
SODIUM: 139 meq/L (ref 135–145)

## 2018-04-16 LAB — HEMOGLOBIN A1C: Hgb A1c MFr Bld: 5.6 % (ref 4.6–6.5)

## 2018-04-16 LAB — PSA: PSA: 1.89 ng/mL (ref 0.10–4.00)

## 2018-04-16 LAB — TSH: TSH: 2.63 u[IU]/mL (ref 0.35–4.50)

## 2018-04-16 NOTE — Patient Instructions (Signed)
It was great seeing you today   I will follow up with you regarding your blood work   Someone from Orthopedics will call you to schedule your appointment   Please return in 2-3 weeks for the skin biopsy

## 2018-04-16 NOTE — Progress Notes (Signed)
Subjective:    Patient ID: Clinton [name], male    DOB: 1940-12-25, 77 y.o.   MRN: [id number]  HPI Patient presents for yearly preventative medicine examination. He is a pleasant 77 year old male who  has a past medical history of Anxiety, Arthritis, Coronary artery disease, Erectile dysfunction, Frozen shoulder, GERD (gastroesophageal reflux disease), History of elevated lipids, Hypertension, Osteoarthritis, and Spinal stenosis (11/24/2014).  Essential Hypertension - Controlled with Lisinopril 20 mg and Lopressor  BP Readings from Last 3 Encounters:  04/16/18 130/76  04/07/18 114/66  01/06/18 130/78   CAD - Takes daily ASA. Does not take statin despite having two stents placed in 2000.  Lab Results  Component Value Date   CHOL 183 10/12/2015   HDL 59.70 10/12/2015   LDLCALC 110 (H) 10/12/2015   TRIG 66.0 10/12/2015   CHOLHDL 3 10/12/2015   Frozen Shoulder/Rotator cuff tear/bilateral.   - Left shoulder has been "frozen"" for at least 7 years. He endorses that this injury was caused while walking his dog.per the patient he was walking his dog, when the dog saw squirrel and ran after him patient attempted to stop him but the dog pulled on the leash and rotated his shoulder 180. Denies any pain in his shoulder but is unable to lift his arm past halfway. - He would like to be seen by orthopedics at this time to see if anything can be done   -Right shoulder has been "frozen" for approximately 12 years. Hhe is unsure of how this injury happened. Endorses that he woke up one day and the shoulder started to become sore, and since that time it's been frozen .over the last month he in endorses increased pain and decreased range of motion.he is only able to raise his right arm halfway as well.he had been driving until a month ago at which time the pain and range of motion became too severe that he is - He would like to be seen by orthopedics at this time to see if anything can be done   All  immunizations and health maintenance protocols were reviewed with the patient and needed orders were placed. He is due for pneumonia vaccination. He refuses vaccinations.   Appropriate screening laboratory values were ordered for the patient including screening of hyperlipidemia, renal function and hepatic function. If indicated by BPH, a PSA was ordered.  Medication reconciliation,  past medical history, social history, problem list and allergies were reviewed in detail with the patient  Goals were established with regard to weight loss, exercise, and  diet in compliance with medications. He does not follow a heart healthy diet. He is unable to exercise due to chronic frozen shoulder and gait instability.   End of life planning was discussed.   Review of Systems  Constitutional: Negative.   HENT: Negative.   Eyes: Negative.   Respiratory: Negative.   Cardiovascular: Negative.   Gastrointestinal: Negative.   Endocrine: Negative.   Genitourinary: Negative.   Musculoskeletal: Positive for arthralgias and gait problem.  Skin: Negative.   Allergic/Immunologic: Negative.   Hematological: Negative.   Psychiatric/Behavioral: Negative.   All other systems reviewed and are negative.  Past Medical History:  Diagnosis Date  . Anxiety   . Arthritis   . Coronary artery disease    have an appointment with Dr Johnsie Cancel 11/15/14  . Erectile dysfunction   . Frozen shoulder   . GERD (gastroesophageal reflux disease)   . History of elevated lipids   . Hypertension   .  Osteoarthritis   . Spinal stenosis 11/24/2014    Social History   Socioeconomic History  . Marital status: Married    Spouse name: Not on file  . Number of children: Not on file  . Years of education: Not on file  . Highest education level: Not on file  Occupational History  . Not on file  Social Needs  . Financial resource strain: Not on file  . Food insecurity:    Worry: Not on file    Inability: Not on file  .  Transportation needs:    Medical: Not on file    Non-medical: Not on file  Tobacco Use  . Smoking status: Former Smoker    Packs/day: 0.50    Years: 10.00    Pack years: 5.00    Types: Cigarettes    Last attempt to quit: 11/11/1974    Years since quitting: 43.4  . Smokeless tobacco: Never Used  . Tobacco comment: age 57 yrs  Substance and Sexual Activity  . Alcohol use: Yes    Comment: occasionally  . Drug use: No  . Sexual activity: Not on file  Lifestyle  . Physical activity:    Days per week: Not on file    Minutes per session: Not on file  . Stress: Not on file  Relationships  . Social connections:    Talks on phone: Not on file    Gets together: Not on file    Attends religious service: Not on file    Active member of club or organization: Not on file    Attends meetings of clubs or organizations: Not on file    Relationship status: Not on file  . Intimate partner violence:    Fear of current or ex partner: Not on file    Emotionally abused: Not on file    Physically abused: Not on file    Forced sexual activity: Not on file  Other Topics Concern  . Not on file  Social History Narrative  . Not on file    Past Surgical History:  Procedure Laterality Date  . CARDIAC SURGERY     2 stents placed   . CORONARY ANGIOPLASTY     mid and proximal LAD stent 09/1999 [facility])  . posterior lumbar fusion L4-5  11-24-14    Family History  Problem Relation Age of Onset  . Heart Problems Father   . Heart disease Father   . Sudden death Father     No Known Allergies  Current Outpatient Medications on File Prior to Visit  Medication Sig Dispense Refill  . aspirin 81 MG chewable tablet Chew by mouth daily.    . calcium elemental as carbonate (BARIATRIC TUMS ULTRA) 400 MG chewable tablet Chew 1,000 mg by mouth 3 (three) times daily.    Marland Kitchen lisinopril (PRINIVIL,ZESTRIL) 20 MG tablet TAKE 1 TABLET BY MOUTH EVERY DAY 90 tablet 0  . metoprolol tartrate (LOPRESSOR)  50 MG tablet TAKE 2 TABLETS BY MOUTH EVERY MORNING AND 1 TABLET EVERY EVENING 270 tablet 0  . ranitidine (ZANTAC) 150 MG tablet Take 150 mg by mouth daily as needed for heartburn.     No current facility-administered medications on file prior to visit.     BP 130/76   Temp 98.4 F (36.9 C) (Oral)   Wt 184 lb (83.5 kg)   BMI 30.15 kg/m       Objective:   Physical Exam  Constitutional: He is oriented to person, place, and time. He  appears well-developed and well-nourished. No distress.  HENT:  Head: Normocephalic and atraumatic.  Right Ear: External ear normal.  Left Ear: External ear normal.  Nose: Nose normal.  Mouth/Throat: Oropharynx is clear and moist. No oropharyngeal exudate.  Eyes: Pupils are equal, round, and reactive to light. Conjunctivae and EOM are normal. Right eye exhibits no discharge. Left eye exhibits no discharge. No scleral icterus.  Neck: Normal range of motion. Neck supple. No JVD present. No tracheal deviation present. No thyromegaly present.  Cardiovascular: Normal rate, regular rhythm, normal heart sounds and intact distal pulses. Exam reveals no gallop and no friction rub.  No murmur heard. Pulmonary/Chest: Effort normal and breath sounds normal. No stridor. No respiratory distress. He has no wheezes. He has no rales. He exhibits no tenderness.  Abdominal: Soft. Bowel sounds are normal. He exhibits no distension and no mass. There is no tenderness. There is no rebound and no guarding. No hernia.  Musculoskeletal: He exhibits tenderness. He exhibits no edema or deformity.       Right shoulder: He exhibits decreased range of motion and decreased strength. He exhibits no tenderness, no crepitus and normal pulse.       Left shoulder: He exhibits decreased range of motion and decreased strength. He exhibits no tenderness, no crepitus and normal pulse.       Left knee: He exhibits decreased range of motion. He exhibits no swelling.  Walks with slow steady gait with  walker  Lymphadenopathy:    He has no cervical adenopathy.  Neurological: He is alert and oriented to person, place, and time. He displays normal reflexes. No cranial nerve deficit or sensory deficit. He exhibits normal muscle tone. Coordination normal.  Skin: Skin is warm and dry. Capillary refill takes less than 2 seconds. No rash noted. He is not diaphoretic. No erythema. No pallor.  Area of hyperpigmentation to the skin with irregular borders on right chest wall.  Psychiatric: He has a normal mood and affect. His behavior is normal. Judgment and thought content normal.  Nursing note and vitals reviewed.     Assessment & Plan:  1. Routine general medical examination at a health care facility - Follow up in one year or sooner if needed - Basic metabolic panel - CBC with Differential/Platelet - Hemoglobin A1c - Hepatic function panel - Lipid panel - TSH - PSA  2. Mixed hyperlipidemia - Consider statin  - Basic metabolic panel - CBC with Differential/Platelet - Hemoglobin A1c - Hepatic function panel - Lipid panel - TSH - PSA  3. Essential hypertension -Well controlled. No change in medication - Basic metabolic panel - CBC with Differential/Platelet - Hemoglobin A1c - Hepatic function panel - Lipid panel - TSH - PSA  4. Coronary artery disease involving native coronary artery of native heart without angina pectoris -Consider statin  - Basic metabolic panel - CBC with Differential/Platelet - Hemoglobin A1c - Hepatic function panel - Lipid panel - TSH - PSA  5. Tear of rotator cuff, unspecified laterality, unspecified tear extent, unspecified whether traumatic  - AMB referral to orthopedics  6. Skin neoplasm -He will return in 1 to 2 weeks to have this area removed and sent for pathology   [name], NP

## 2018-04-23 ENCOUNTER — Telehealth: Payer: Self-pay | Admitting: Adult Health

## 2018-04-23 DIAGNOSIS — I10 Essential (primary) hypertension: Secondary | ICD-10-CM

## 2018-04-23 MED ORDER — METOPROLOL TARTRATE 50 MG PO TABS: ORAL_TABLET | ORAL | 0 refills | Status: DC

## 2018-04-23 NOTE — Telephone Encounter (Signed)
Copied from [place] [id number]. Topic: Quick Communication - Rx Refill/Question >> Apr 23, 2018  3:33 PM Margot Ables wrote: Medication: metoprolol - has 3-4 days left -  Has the patient contacted their pharmacy? No - no refills Preferred Pharmacy (with phone number or street name): [facility] [place], [place]

## 2018-07-24 ENCOUNTER — Telehealth: Payer: Self-pay | Admitting: Adult Health

## 2018-07-24 DIAGNOSIS — I10 Essential (primary) hypertension: Secondary | ICD-10-CM

## 2018-07-24 MED ORDER — LISINOPRIL 20 MG PO TABS: ORAL_TABLET | ORAL | 2 refills | Status: DC

## 2018-07-24 MED ORDER — METOPROLOL TARTRATE 50 MG PO TABS: ORAL_TABLET | ORAL | 2 refills | Status: DC

## 2018-07-24 NOTE — Telephone Encounter (Signed)
Copied from [place] [id number]. Topic: Quick Communication - Rx Refill/Question >> Jul 24, 2018  4:22 PM Bea Graff, NT wrote: Medication: lisinopril (PRINIVIL,ZESTRIL) 20 MG tablet and metoprolol tartrate (LOPRESSOR) 50 MG tablet  Has the patient contacted their pharmacy? Yes.   (Agent: If no, request that the patient contact the pharmacy for the refill.) (Agent: If yes, when and what did the pharmacy advise?)  Preferred Pharmacy (with phone number or street name): [facility] [place], [address] AT [place] [phone] (Phone) [phone] (Fax)    Agent: Please be advised that RX refills may take up to 3 business days. We ask that you follow-up with your pharmacy.

## 2019-03-15 ENCOUNTER — Other Ambulatory Visit: Payer: Self-pay | Admitting: Adult Health

## 2019-03-15 DIAGNOSIS — I10 Essential (primary) hypertension: Secondary | ICD-10-CM

## 2019-03-16 NOTE — Telephone Encounter (Signed)
Sent to the pharmacy by e-scribe. 

## 2019-03-16 NOTE — Telephone Encounter (Signed)
Ok for 90 days  

## 2019-05-09 ENCOUNTER — Other Ambulatory Visit: Payer: Self-pay | Admitting: Adult Health

## 2019-05-09 DIAGNOSIS — I10 Essential (primary) hypertension: Secondary | ICD-10-CM

## 2019-05-11 NOTE — Telephone Encounter (Signed)
Sent to the pharmacy by e-scribe for 90 days. 

## 2019-08-11 ENCOUNTER — Other Ambulatory Visit: Payer: Self-pay | Admitting: Adult Health

## 2019-08-11 DIAGNOSIS — I10 Essential (primary) hypertension: Secondary | ICD-10-CM

## 2019-08-13 NOTE — Telephone Encounter (Signed)
Called and left a message that pt needs cpx and fasting lab work.  30 day supply sent to the pharmacy by e-scribe.

## 2019-09-18 ENCOUNTER — Other Ambulatory Visit: Payer: Self-pay | Admitting: Adult Health

## 2019-09-18 DIAGNOSIS — I10 Essential (primary) hypertension: Secondary | ICD-10-CM

## 2019-09-21 NOTE — Telephone Encounter (Signed)
DENIED.  NEEDS CPX AND FASTING LAB WORK.

## 2019-09-24 ENCOUNTER — Other Ambulatory Visit: Payer: Self-pay

## 2019-09-24 ENCOUNTER — Ambulatory Visit (INDEPENDENT_AMBULATORY_CARE_PROVIDER_SITE_OTHER): Payer: Medicare HMO | Admitting: Adult Health

## 2019-09-24 ENCOUNTER — Encounter: Payer: Self-pay | Admitting: Adult Health

## 2019-09-24 DIAGNOSIS — I10 Essential (primary) hypertension: Secondary | ICD-10-CM | POA: Diagnosis not present

## 2019-09-24 MED ORDER — LISINOPRIL 20 MG PO TABS: 20.0000 mg | ORAL_TABLET | Freq: Every day | ORAL | 2 refills | Status: DC

## 2019-09-24 MED ORDER — METOPROLOL TARTRATE 50 MG PO TABS: ORAL_TABLET | ORAL | 2 refills | Status: DC

## 2019-09-24 NOTE — Progress Notes (Signed)
Virtual Visit via Telephone Note  I connected with Clinton [name] on 09/24/19 at  2:30 PM EDT by telephone and verified that I am speaking with the correct person using two identifiers.   I discussed the limitations, risks, security and privacy concerns of performing an evaluation and management service by telephone and the availability of in person appointments. I also discussed with the patient that there may be a patient responsible charge related to this service. The patient expressed understanding and agreed to proceed.  Location patient: home Location provider: work or home office Participants present for the call: patient, provider Patient did not have a visit in the prior 7 days to address this/these issue(s).   History of Present Illness: 78 year old male who  has a past medical history of Anxiety, Arthritis, Coronary artery disease, Erectile dysfunction, Frozen shoulder, GERD (gastroesophageal reflux disease), History of elevated lipids, Hypertension, Osteoarthritis, and Spinal stenosis (11/24/2014).  Is being evaluated today for medication refill.  He is almost out of lisinopril and metoprolol.  He has not been seen since May 2019.  He denies any issues such as dizziness, lightheadedness, chest pain, or shortness of breath.  He is scared to go out of his house because of Covid and that is the reason that he gives for not coming into the office for his physical.    Observations/Objective: Patient sounds cheerful and well on the phone. I do not appreciate any SOB. Speech and thought processing are grossly intact. Patient reported vitals:  Assessment and Plan: 1. Essential hypertension -I advised him that we are following proper guidelines for Covid precautions.  I will refill his medication for 90 days, which gives him more than enough time to come into the office to have his physical exam done.  He was advised that it was important to monitor his general health as well as  kidney function due to his medical history.  He agrees to come in for his CPE - lisinopril (ZESTRIL) 20 MG tablet; Take 1 tablet (20 mg total) by mouth daily.  Dispense: 30 tablet; Refill: 2 - metoprolol tartrate (LOPRESSOR) 50 MG tablet; TAKE 2 TABLETS BY MOUTH EVERY MORNING AND 1 TABLET EVERY EVENING  Dispense: 90 tablet; Refill: 2   Follow Up Instructions:  I did not refer this patient for an OV in the next 24 hours for this/these issue(s).  I discussed the assessment and treatment plan with the patient. The patient was provided an opportunity to ask questions and all were answered. The patient agreed with the plan and demonstrated an understanding of the instructions.   The patient was advised to call back or seek an in-person evaluation if the symptoms worsen or if the condition fails to improve as anticipated.  I provided 15 minutes of non-face-to-face time during this encounter.   [name], NP

## 2019-12-25 ENCOUNTER — Other Ambulatory Visit: Payer: Self-pay | Admitting: Adult Health

## 2019-12-25 DIAGNOSIS — I10 Essential (primary) hypertension: Secondary | ICD-10-CM

## 2019-12-31 ENCOUNTER — Telehealth: Payer: Self-pay | Admitting: Adult Health

## 2019-12-31 NOTE — Telephone Encounter (Signed)
Left message to return phone call.

## 2019-12-31 NOTE — Telephone Encounter (Signed)
Patient is upset about the number of days his prescription was called in for.  He is needing a call back.

## 2020-01-05 ENCOUNTER — Other Ambulatory Visit: Payer: Self-pay

## 2020-01-05 DIAGNOSIS — I10 Essential (primary) hypertension: Secondary | ICD-10-CM

## 2020-01-05 MED ORDER — LISINOPRIL 20 MG PO TABS: ORAL_TABLET | ORAL | 0 refills | Status: DC

## 2020-01-05 MED ORDER — METOPROLOL TARTRATE 50 MG PO TABS: ORAL_TABLET | ORAL | 0 refills | Status: DC

## 2020-01-05 NOTE — Telephone Encounter (Signed)
Rx refilled for 90 days

## 2020-01-17 ENCOUNTER — Telehealth: Payer: Self-pay | Admitting: Adult Health

## 2020-01-17 NOTE — Telephone Encounter (Signed)
Medication Refill: Metoprolol  Pharmacy: [facility] [address]  Joylene Igo: [phone]   Pt states he only has two day supply left.

## 2020-01-18 ENCOUNTER — Other Ambulatory Visit: Payer: Self-pay

## 2020-01-18 DIAGNOSIS — I10 Essential (primary) hypertension: Secondary | ICD-10-CM

## 2020-01-18 MED ORDER — METOPROLOL TARTRATE 50 MG PO TABS: ORAL_TABLET | ORAL | 0 refills | Status: DC

## 2020-01-18 NOTE — Telephone Encounter (Signed)
Rx refilled.

## 2020-01-21 ENCOUNTER — Other Ambulatory Visit: Payer: Self-pay | Admitting: Adult Health

## 2020-01-21 DIAGNOSIS — I10 Essential (primary) hypertension: Secondary | ICD-10-CM

## 2020-01-21 MED ORDER — LISINOPRIL 20 MG PO TABS: ORAL_TABLET | ORAL | 0 refills | Status: DC

## 2020-01-21 MED ORDER — METOPROLOL TARTRATE 50 MG PO TABS: ORAL_TABLET | ORAL | 0 refills | Status: DC

## 2020-01-21 NOTE — Telephone Encounter (Signed)
Pt is requesting a refill on Metoprolol Tartrate 50 Mg tablet and Lisinopril 20 MG tablet sent to [facility] on [address] and [address]. Pt is out of medication. Thanks

## 2020-01-21 NOTE — Telephone Encounter (Signed)
Rx sent to pharmacy   

## 2020-02-17 ENCOUNTER — Encounter: Payer: Self-pay | Admitting: Adult Health

## 2020-02-17 ENCOUNTER — Ambulatory Visit (INDEPENDENT_AMBULATORY_CARE_PROVIDER_SITE_OTHER): Payer: Medicare HMO | Admitting: Adult Health

## 2020-02-17 ENCOUNTER — Other Ambulatory Visit: Payer: Self-pay

## 2020-02-17 VITALS — BP 134/74 | HR 44 | Temp 98.0°F

## 2020-02-17 DIAGNOSIS — R001 Bradycardia, unspecified: Secondary | ICD-10-CM | POA: Diagnosis not present

## 2020-02-17 DIAGNOSIS — R600 Localized edema: Secondary | ICD-10-CM | POA: Diagnosis not present

## 2020-02-17 DIAGNOSIS — D492 Neoplasm of unspecified behavior of bone, soft tissue, and skin: Secondary | ICD-10-CM | POA: Diagnosis not present

## 2020-02-17 LAB — BASIC METABOLIC PANEL
BUN: 23 mg/dL (ref 6–23)
CO2: 26 mEq/L (ref 19–32)
Calcium: 9.1 mg/dL (ref 8.4–10.5)
Chloride: 108 mEq/L (ref 96–112)
Creatinine, Ser: 0.94 mg/dL (ref 0.40–1.50)
GFR: 77.61 mL/min (ref >60.00)
Glucose, Bld: 88 mg/dL (ref 70–99)
Potassium: 3.9 mEq/L (ref 3.5–5.1)
Sodium: 140 mEq/L (ref 135–145)

## 2020-02-17 LAB — CBC WITH DIFFERENTIAL/PLATELET
Basophils Absolute: 0.1 10*3/uL (ref 0.0–0.1)
Basophils Relative: 1.1 % (ref 0.0–3.0)
Eosinophils Absolute: 0.2 10*3/uL (ref 0.0–0.7)
Eosinophils Relative: 3 % (ref 0.0–5.0)
HCT: 39.3 % (ref 39.0–52.0)
Hemoglobin: 13.4 g/dL (ref 13.0–17.0)
Lymphocytes Relative: 21.1 % (ref 12.0–46.0)
Lymphs Abs: 1.2 10*3/uL (ref 0.7–4.0)
MCHC: 34 g/dL (ref 30.0–36.0)
MCV: 96.5 fl (ref 78.0–100.0)
Monocytes Absolute: 0.5 10*3/uL (ref 0.1–1.0)
Monocytes Relative: 8.7 % (ref 3.0–12.0)
Neutro Abs: 3.8 10*3/uL (ref 1.4–7.7)
Neutrophils Relative %: 66.1 % (ref 43.0–77.0)
Platelets: 168 10*3/uL (ref 150.0–400.0)
RBC: 4.08 Mil/uL — ABNORMAL LOW (ref 4.22–5.81)
RDW: 13.5 % (ref 11.5–15.5)
WBC: 5.8 10*3/uL (ref 4.0–10.5)

## 2020-02-17 LAB — BRAIN NATRIURETIC PEPTIDE: Pro B Natriuretic peptide (BNP): 329 pg/mL — ABNORMAL HIGH (ref 0.0–100.0)

## 2020-02-17 NOTE — Patient Instructions (Addendum)
It was great seeing you today   I am going to get some blood work on you. Likely we are going to make some changes to your blood pressure medication and add Lasix to help get the fluid off your legs  I will let you know what the biopsy shows

## 2020-02-17 NOTE — Progress Notes (Signed)
Subjective:    Patient ID: Clinton [name], male    DOB: 06-18-1941, 79 y.o.   MRN: [id number]  HPI 79 year old male who  has a past medical history of Anxiety, Arthritis, Coronary artery disease, Erectile dysfunction, Frozen shoulder, GERD (gastroesophageal reflux disease), History of elevated lipids, Hypertension, Osteoarthritis, and Spinal stenosis (11/24/2014).  He presents to the office today with his home health aide, his wife suddenly passed away recently from a stroke.  His first issue is lower extremity edema.  He reports lower extremity edema for 2 years or greater.  He does not feel as though the edema has gotten worse and he denies any pain in his lower extremities.  He does elevate his legs the edema proceeds but not completely.  He has never noticed any wounds or weeping.  He denies shortness of breath, chest pain, calf pain.  He is not ambulatory without using a walker but mostly is sedentary.  His second issue is that of a neoplasm of uncertain origin on his right chest wall.  He reports having this for upwards of "20 years".  Denies drainage, or pain from the lesion.   Review of Systems See HPI   Past Medical History:  Diagnosis Date  . Anxiety   . Arthritis   . Coronary artery disease    have an appointment with Dr Johnsie Cancel 11/15/14  . Erectile dysfunction   . Frozen shoulder   . GERD (gastroesophageal reflux disease)   . History of elevated lipids   . Hypertension   . Osteoarthritis   . Spinal stenosis 11/24/2014    Social History   Socioeconomic History  . Marital status: Married    Spouse name: Not on file  . Number of children: Not on file  . Years of education: Not on file  . Highest education level: Not on file  Occupational History  . Not on file  Tobacco Use  . Smoking status: Former Smoker    Packs/day: 0.50    Years: 10.00    Pack years: 5.00    Types: Cigarettes    Quit date: 11/11/1974    Years since quitting: 45.2  . Smokeless tobacco:  Never Used  . Tobacco comment: age 18 yrs  Substance and Sexual Activity  . Alcohol use: Yes    Comment: occasionally  . Drug use: No  . Sexual activity: Not on file  Other Topics Concern  . Not on file  Social History Narrative  . Not on file   Social Determinants of Health   Financial Resource Strain:   . Difficulty of Paying Living Expenses:   Food Insecurity:   . Worried About Charity fundraiser in the Last Year:   . Arboriculturist in the Last Year:   Transportation Needs:   . Film/video editor (Medical):   Marland Kitchen Lack of Transportation (Non-Medical):   Physical Activity:   . Days of Exercise per Week:   . Minutes of Exercise per Session:   Stress:   . Feeling of Stress :   Social Connections:   . Frequency of Communication with Friends and Family:   . Frequency of Social Gatherings with Friends and Family:   . Attends Religious Services:   . Active Member of Clubs or Organizations:   . Attends Archivist Meetings:   Marland Kitchen Marital Status:   Intimate Partner Violence:   . Fear of Current or Ex-Partner:   . Emotionally Abused:   Marland Kitchen Physically  Abused:   . Sexually Abused:     Past Surgical History:  Procedure Laterality Date  . CARDIAC SURGERY     2 stents placed   . CORONARY ANGIOPLASTY     mid and proximal LAD stent 09/1999 [facility])  . posterior lumbar fusion L4-5  11-24-14    Family History  Problem Relation Age of Onset  . Heart Problems Father   . Heart disease Father   . Sudden death Father     No Known Allergies  Current Outpatient Medications on File Prior to Visit  Medication Sig Dispense Refill  . aspirin 81 MG chewable tablet Chew by mouth daily.    . calcium elemental as carbonate (BARIATRIC TUMS ULTRA) 400 MG chewable tablet Chew 1,000 mg by mouth 3 (three) times daily.    Marland Kitchen lisinopril (ZESTRIL) 20 MG tablet TAKE 1 TABLET(20 MG) BY MOUTH DAILY 90 tablet 0  . metoprolol tartrate (LOPRESSOR) 50 MG tablet Take 2 tablets by  mouth every morning and 1 tablet every evenings. 270 tablet 0   No current facility-administered medications on file prior to visit.    BP 134/74   Temp 98 F (36.7 C)       Objective:   Physical Exam Vitals and nursing note reviewed.  Constitutional:      Appearance: He is well-developed.  Cardiovascular:     Rate and Rhythm: Normal rate and regular rhythm.     Pulses: Normal pulses.     Heart sounds: Normal heart sounds.  Pulmonary:     Effort: Pulmonary effort is normal.     Breath sounds: Normal breath sounds.  Musculoskeletal:     Right lower leg: 3+ Pitting Edema present.     Left lower leg: 3+ Pitting Edema present.     Comments: Unable to palpate pedal pulses due to the amount of fluid.  Skin:    Capillary Refill: Capillary refill takes 2 to 3 seconds.     Findings: Lesion present.          Comments: 1.5 x 2 cm raised black neoplasm noted on right chest wall  Skin is discolored in all digits of bilateral lower extremities, skin is a purpleish blue with sluggish cap refill  Neurological:     Mental Status: He is alert.       Assessment & Plan:  1. Lower extremity edema -We will check labs.  Likely place on Lasix.  Can consider referral to vein and vascular for further evaluation - Basic Metabolic Panel - Brain Natriuretic Peptide - CBC with Differential/Platelet  2. Skin eruption -Concern for cell carcinoma versus melanoma. Procedure including risks/benefits explained to patient.  Questions were answered. After informed consent was obtained and a time out completed, site was cleansed with betadine and then alcohol. 1% Lidocaine with epinephrine was injected under lesion and then shave biopsy was performed. Area was cauterized to obtain hemostasis.  Pt tolerated procedure well.  Specimen sent for pathology review.  Pt instructed to keep the area dry for 24 hours and to contact us if he develops redness, drainage or swelling at the site.  Pt may use tylenol as  needed for discomfort today.  - Dermatology pathology -We will refer to dermatology if needed  3. Bradycardia -From being on a beta-blocker.  With unlikely we are going to place him on Lasix, will decrease metoprolol from 50 mg to 25 mg twice daily.  Have him follow-up in 2 weeks  [name], NP

## 2020-02-21 ENCOUNTER — Telehealth: Payer: Self-pay | Admitting: Adult Health

## 2020-02-21 NOTE — Telephone Encounter (Signed)
Pt returned phone call about test results, would like a call back.

## 2020-02-22 ENCOUNTER — Telehealth: Payer: Self-pay | Admitting: Adult Health

## 2020-02-22 ENCOUNTER — Other Ambulatory Visit: Payer: Self-pay | Admitting: Adult Health

## 2020-02-22 DIAGNOSIS — C4352 Malignant melanoma of skin of breast: Secondary | ICD-10-CM

## 2020-02-22 NOTE — Telephone Encounter (Signed)
Results show a Malignant melanoma

## 2020-02-22 NOTE — Telephone Encounter (Signed)
Noted. Will call

## 2020-02-22 NOTE — Telephone Encounter (Signed)
Updated patient on dermatology pathology which showed malignant melanoma.  Will refer to dermatology for further evaluation   Patient is ok with this plan

## 2020-02-22 NOTE — Telephone Encounter (Signed)
Colletta Maryland from [facility] would like a call back to provide results for pt.    [phone]

## 2020-02-23 ENCOUNTER — Encounter: Payer: Self-pay | Admitting: Family Medicine

## 2020-03-03 ENCOUNTER — Telehealth: Payer: Self-pay | Admitting: Adult Health

## 2020-03-03 NOTE — Telephone Encounter (Signed)
Pt is requesting to speak to you regarding a referral for his skin cancer. Pt says, he hasn't heard back from the office and was just following up. Pt is aware you are out of the office today and will return on 03/07/20. Thanks.

## 2020-03-07 NOTE — Telephone Encounter (Signed)
[facility]  at [address] [place], [place] [id number] Ph: [phone] Fax: [phone]

## 2020-03-07 NOTE — Telephone Encounter (Signed)
Left a message informing the pt he was referred to [facility] and asked that he give them a call.  Left their # on his machine.  Advised a call back if needed.

## 2020-04-16 ENCOUNTER — Encounter (HOSPITAL_COMMUNITY): Payer: Self-pay | Admitting: Emergency Medicine

## 2020-04-16 ENCOUNTER — Emergency Department (HOSPITAL_COMMUNITY)
Admission: EM | Admit: 2020-04-16 | Discharge: 2020-04-16 | Disposition: A | Discharge status: Home / Self Care | Payer: Medicare HMO | Attending: Emergency Medicine | Admitting: Emergency Medicine

## 2020-04-16 ENCOUNTER — Other Ambulatory Visit: Payer: Self-pay

## 2020-04-16 ENCOUNTER — Emergency Department (HOSPITAL_COMMUNITY): Payer: Medicare HMO

## 2020-04-16 DIAGNOSIS — Z7982 Long term (current) use of aspirin: Secondary | ICD-10-CM | POA: Diagnosis not present

## 2020-04-16 DIAGNOSIS — S0990XA Unspecified injury of head, initial encounter: Secondary | ICD-10-CM | POA: Insufficient documentation

## 2020-04-16 DIAGNOSIS — T148XXA Other injury of unspecified body region, initial encounter: Secondary | ICD-10-CM

## 2020-04-16 DIAGNOSIS — M542 Cervicalgia: Secondary | ICD-10-CM | POA: Insufficient documentation

## 2020-04-16 DIAGNOSIS — M79641 Pain in right hand: Secondary | ICD-10-CM | POA: Diagnosis not present

## 2020-04-16 DIAGNOSIS — Z79899 Other long term (current) drug therapy: Secondary | ICD-10-CM | POA: Insufficient documentation

## 2020-04-16 DIAGNOSIS — W19XXXA Unspecified fall, initial encounter: Secondary | ICD-10-CM

## 2020-04-16 DIAGNOSIS — I2510 Atherosclerotic heart disease of native coronary artery without angina pectoris: Secondary | ICD-10-CM | POA: Insufficient documentation

## 2020-04-16 DIAGNOSIS — R220 Localized swelling, mass and lump, head: Secondary | ICD-10-CM | POA: Diagnosis not present

## 2020-04-16 DIAGNOSIS — Z9861 Coronary angioplasty status: Secondary | ICD-10-CM | POA: Diagnosis not present

## 2020-04-16 DIAGNOSIS — Y92000 Kitchen of unspecified non-institutional (private) residence as  the place of occurrence of the external cause: Secondary | ICD-10-CM | POA: Insufficient documentation

## 2020-04-16 DIAGNOSIS — M25511 Pain in right shoulder: Secondary | ICD-10-CM | POA: Diagnosis not present

## 2020-04-16 DIAGNOSIS — W01198A Fall on same level from slipping, tripping and stumbling with subsequent striking against other object, initial encounter: Secondary | ICD-10-CM | POA: Diagnosis not present

## 2020-04-16 DIAGNOSIS — Z23 Encounter for immunization: Secondary | ICD-10-CM | POA: Diagnosis not present

## 2020-04-16 DIAGNOSIS — Y999 Unspecified external cause status: Secondary | ICD-10-CM | POA: Insufficient documentation

## 2020-04-16 DIAGNOSIS — Y939 Activity, unspecified: Secondary | ICD-10-CM | POA: Insufficient documentation

## 2020-04-16 DIAGNOSIS — S61011A Laceration without foreign body of right thumb without damage to nail, initial encounter: Secondary | ICD-10-CM | POA: Diagnosis not present

## 2020-04-16 DIAGNOSIS — S6991XA Unspecified injury of right wrist, hand and finger(s), initial encounter: Secondary | ICD-10-CM | POA: Diagnosis not present

## 2020-04-16 DIAGNOSIS — S199XXA Unspecified injury of neck, initial encounter: Secondary | ICD-10-CM | POA: Diagnosis not present

## 2020-04-16 DIAGNOSIS — S60511A Abrasion of right hand, initial encounter: Secondary | ICD-10-CM | POA: Diagnosis not present

## 2020-04-16 DIAGNOSIS — I10 Essential (primary) hypertension: Secondary | ICD-10-CM | POA: Insufficient documentation

## 2020-04-16 DIAGNOSIS — G4489 Other headache syndrome: Secondary | ICD-10-CM | POA: Diagnosis not present

## 2020-04-16 DIAGNOSIS — Z87891 Personal history of nicotine dependence: Secondary | ICD-10-CM | POA: Diagnosis not present

## 2020-04-16 MED ORDER — TETANUS-DIPHTH-ACELL PERTUSSIS 5-2.5-18.5 LF-MCG/0.5 IM SUSP
0.5000 mL | Freq: Once | INTRAMUSCULAR | Status: AC
  Administered 2020-04-16: 0.5 mL via INTRAMUSCULAR
  Filled 2020-04-16: qty 0.5

## 2020-04-16 MED ORDER — SODIUM CHLORIDE 0.9 % IV BOLUS: 1000.0000 mL | Freq: Once | INTRAVENOUS | Status: DC

## 2020-04-16 NOTE — ED Notes (Signed)
Sign pad unavailabe. Pt a&ox4 and ambulatory upon departure, provided with and understands discharge instructions.

## 2020-04-16 NOTE — ED Provider Notes (Signed)
[facility] EMERGENCY DEPARTMENT Provider Note   CSN: [id number] Arrival date & time: 04/16/20  1849     History Chief Complaint  Patient presents with  . Fall    Clinton [name] is a 79 y.o. male.  He has a history of arthritis coronary disease hypertension.  He said he was in his kitchen getting something out of the refrigerator and slipped on some water on the floor and fell to the ground.  Struck the back of his head.  No loss of consciousness.  He has a cut on his thumb right.  Some right shoulder pain but does have a lot of chronic arthritis pain.  Was able to walk after the fall.  The history is provided by the patient.  Fall This is a new problem. The current episode started 1 to 2 hours ago. The problem occurs rarely. The problem has not changed since onset.Associated symptoms include headaches. Pertinent negatives include no chest pain, no abdominal pain and no shortness of breath. The symptoms are aggravated by bending and twisting. Nothing relieves the symptoms. He has tried nothing for the symptoms. The treatment provided no relief.       Past Medical History:  Diagnosis Date  . Anxiety   . Arthritis   . Coronary artery disease    have an appointment with Dr Johnsie Cancel 11/15/14  . Erectile dysfunction   . Frozen shoulder   . GERD (gastroesophageal reflux disease)   . History of elevated lipids   . Hypertension   . Osteoarthritis   . Spinal stenosis 11/24/2014    Patient Active Problem List   Diagnosis Date Noted  . Rotator cuff tear arthropathy of both shoulders 01/25/2016  . Degenerative arthritis of knee, bilateral 01/25/2016  . Status post lumbar spinal fusion 11/29/2014  . GERD (gastroesophageal reflux disease) 11/25/2014  . Spinal stenosis 11/24/2014  . CAD (coronary artery disease) 11/15/2014  . HTN (hypertension) 11/15/2014  . Hyperlipidemia 11/15/2014    Past Surgical History:  Procedure Laterality Date  . CARDIAC SURGERY     2  stents placed   . CORONARY ANGIOPLASTY     mid and proximal LAD stent 09/1999 [facility])  . posterior lumbar fusion L4-5  11-24-14       Family History  Problem Relation Age of Onset  . Heart Problems Father   . Heart disease Father   . Sudden death Father     Social History   Tobacco Use  . Smoking status: Former Smoker    Packs/day: 0.50    Years: 10.00    Pack years: 5.00    Types: Cigarettes    Quit date: 11/11/1974    Years since quitting: 45.4  . Smokeless tobacco: Never Used  . Tobacco comment: age 6 yrs  Substance Use Topics  . Alcohol use: Yes    Comment: occasionally  . Drug use: No    Home Medications Prior to Admission medications   Medication Sig Start Date End Date Taking? Authorizing Provider  aspirin 81 MG chewable tablet Chew by mouth daily.    [provider]  calcium elemental as carbonate (BARIATRIC TUMS ULTRA) 400 MG chewable tablet Chew 1,000 mg by mouth 3 (three) times daily.    [provider]  lisinopril (ZESTRIL) 20 MG tablet TAKE 1 TABLET(20 MG) BY MOUTH DAILY 01/21/20   Nafziger, Tommi Rumps, NP  metoprolol tartrate (LOPRESSOR) 50 MG tablet Take 2 tablets by mouth every morning and 1 tablet every evenings.  01/21/20   [name], NP    Allergies    Patient has no known allergies.  Review of Systems   Review of Systems  Constitutional: Negative for fever.  HENT: Negative for sore throat.   Eyes: Negative for visual disturbance.  Respiratory: Negative for shortness of breath.   Cardiovascular: Negative for chest pain.  Gastrointestinal: Negative for abdominal pain.  Genitourinary: Negative for dysuria.  Musculoskeletal: Positive for arthralgias and neck pain.  Skin: Positive for wound. Negative for rash.  Neurological: Positive for headaches.    Physical Exam Updated Vital Signs BP (!) 142/68 (BP Location: Left Arm)   Pulse (!) 48   Temp 97.6 F (36.4 C) (Oral)   Resp 15   SpO2 99%   Physical  Exam Vitals and nursing note reviewed.  Constitutional:      Appearance: He is well-developed.  HENT:     Head: Normocephalic and atraumatic.  Eyes:     Conjunctiva/sclera: Conjunctivae normal.  Neck:     Comments: In cervical collar trach midline Cardiovascular:     Rate and Rhythm: Normal rate and regular rhythm.     Heart sounds: No murmur.  Pulmonary:     Effort: Pulmonary effort is normal. No respiratory distress.     Breath sounds: Normal breath sounds.  Abdominal:     Palpations: Abdomen is soft.     Tenderness: There is no abdominal tenderness.  Musculoskeletal:     Right lower leg: Edema present.     Left lower leg: Edema present.     Comments: Is a small skin tear laceration on his right thumb  Skin:    General: Skin is warm and dry.     Capillary Refill: Capillary refill takes less than 2 seconds.  Neurological:     General: No focal deficit present.     Mental Status: He is alert.     Sensory: No sensory deficit.     Motor: No weakness.     ED Results / Procedures / Treatments   Labs (all labs ordered are listed, but only abnormal results are displayed) Labs Reviewed - No data to display  EKG None  Radiology DG Shoulder Right  Result Date: 04/16/2020 CLINICAL DATA:  79 year old male with fall and right shoulder pain. EXAM: RIGHT SHOULDER - 2+ VIEW COMPARISON:  None. FINDINGS: There is an age indeterminate fracture of the tip of the acromion, possibly acute. Correlation with point tenderness recommended. No other acute fracture identified. No dislocation. There is degenerative changes of the right shoulder with joint space narrowing and spurring. There is chronic widening of the right AC joint. There is elevation of the right humeral head suggestive of chronic rotator cuff injury. The soft tissues are unremarkable. IMPRESSION: 1. Age indeterminate fracture of the tip of the acromion, possibly acute. Correlation with point tenderness recommended. 2. Degenerative  changes of the right shoulder. Electronically Signed   By: Anner Crete M.D.   On: 04/16/2020 19:53   CT Head Wo Contrast  Result Date: 04/16/2020 CLINICAL DATA:  [place], supraorbital swelling, hit posterior head EXAM: CT HEAD WITHOUT CONTRAST TECHNIQUE: Contiguous axial images were obtained from the base of the skull through the vertex without intravenous contrast. COMPARISON:  None. FINDINGS: Brain: No acute infarct or hemorrhage. Lateral ventricles and midline structures are unremarkable. No acute extra-axial fluid collections. No mass effect. Vascular: No hyperdense vessel or unexpected calcification. Skull: Normal. Negative for fracture or focal lesion. Sinuses/Orbits: No acute finding. Other: None. IMPRESSION: 1. No  acute intracranial process. Electronically Signed   By: Randa Ngo M.D.   On: 04/16/2020 19:31   CT Cervical Spine Wo Contrast  Result Date: 04/16/2020 CLINICAL DATA:  [place], posterior head impact EXAM: CT CERVICAL SPINE WITHOUT CONTRAST TECHNIQUE: Multidetector CT imaging of the cervical spine was performed without intravenous contrast. Multiplanar CT image reconstructions were also generated. COMPARISON:  None. FINDINGS: Alignment: There is slight reversal of cervical lordosis, likely positional. Otherwise alignment is anatomic. Skull base and vertebrae: No acute displaced fractures. Soft tissues and spinal canal: No prevertebral fluid or swelling. No visible canal hematoma. Disc levels: Prominent spondylosis at C3/C4 with significant bilateral neural foraminal encroachment. Upper chest: Emphysema is seen within the lung apices. Visualized portions of the airway are patent. Other: Reconstructed images demonstrate no additional findings. IMPRESSION: 1. No acute cervical spine fracture. 2. Prominent spondylosis at C3/C4. Electronically Signed   By: Randa Ngo M.D.   On: 04/16/2020 19:34   DG Hand Complete Right  Result Date: 04/16/2020 CLINICAL DATA:  Pain status post fall. EXAM:  RIGHT HAND - COMPLETE 3+ VIEW COMPARISON:  None. FINDINGS: There is no acute displaced fracture. No dislocation. There are advanced degenerative changes of the first carpometacarpal joint. IMPRESSION: Negative. Electronically Signed   By: Constance Holster M.D.   On: 04/16/2020 19:54    Procedures Procedures (including critical care time)  Medications Ordered in ED Medications  Tdap (BOOSTRIX) injection 0.5 mL (has no administration in time range)    ED Course  I have reviewed the triage vital signs and the nursing notes.  Pertinent labs & imaging results that were available during my care of the patient were reviewed by me and considered in my medical decision making (see chart for details).  Clinical Course as of Apr 17 1022  Sun Apr 16, 2020  2050 Patient ambulated with a technician safely.  He is comfortable with going home.  He is calling family to pick him up.  Hand wound was mostly a skin tear and so just required a Band-Aid.   [MB]    Clinical Course User Index [MB] Hayden Rasmussen, MD   MDM Rules/Calculators/A&P                     79 year old male not on anticoagulation here for evaluation of injuries after a mechanical fall.  Has some posterior head tenderness along with some skin tears on his right hand.  Has some shoulder pain more so on the right which he says is chronic for him.  Differential includes fracture, bleed, dislocation, contusion.  He had imaging of his CT head and CT C-spine which I interpret as having no acute bleed or fracture.  X-rays of right shoulder show degenerative changes but no gross fracture or dislocation.  Wounds were cleaned up and do not need to be repaired by sutures.  Patient ambulated in the department and feels comfortable with discharge.  Return instructions discussed. Final Clinical Impression(s) / ED Diagnoses Final diagnoses:  Fall, initial encounter  Injury of head, initial encounter  Abrasion    Rx / DC Orders ED Discharge  Orders    None       Hayden Rasmussen, MD 04/17/20 1025

## 2020-04-16 NOTE — ED Triage Notes (Signed)
BIB EMS. Ground level fall. Denies pain. Did hit head. Complaining of headache. Edema in lower extremities but refuses lasix at home. VSS. NAD.

## 2020-04-16 NOTE — Discharge Instructions (Addendum)
Seen in the emergency department for evaluation of injuries from a fall.  You had a CAT scan of your head and cervical spine that did not show any acute findings.  You also had x-rays of your right hand and right shoulder that did not show any serious findings.  You had some wounds on your hand that were dressed.  Please use over-the-counter pain medicine as needed.  Ice to any affected area that is hurting.  Follow-up with your doctor.  Return to the emergency department if any worsening or concerning symptoms

## 2020-08-06 ENCOUNTER — Other Ambulatory Visit: Payer: Self-pay | Admitting: Adult Health

## 2020-08-06 DIAGNOSIS — I10 Essential (primary) hypertension: Secondary | ICD-10-CM

## 2020-08-07 ENCOUNTER — Other Ambulatory Visit: Payer: Self-pay | Admitting: Adult Health

## 2020-08-07 DIAGNOSIS — I10 Essential (primary) hypertension: Secondary | ICD-10-CM

## 2020-10-25 ENCOUNTER — Telehealth: Payer: Self-pay | Admitting: Adult Health

## 2020-10-25 NOTE — Telephone Encounter (Signed)
Left message for patient to call back and schedule Medicare Annual Wellness Visit (AWV) either virtually or in office.  Last AWV 03/05/17; please schedule at anytime with [facility] Nurse Health Advisor 2.  This should be a 45 minute visit.

## 2020-11-06 ENCOUNTER — Other Ambulatory Visit: Payer: Self-pay | Admitting: Adult Health

## 2020-11-06 DIAGNOSIS — I10 Essential (primary) hypertension: Secondary | ICD-10-CM

## 2020-11-07 ENCOUNTER — Other Ambulatory Visit: Payer: Self-pay | Admitting: Adult Health

## 2020-11-07 DIAGNOSIS — I10 Essential (primary) hypertension: Secondary | ICD-10-CM

## 2020-12-17 ENCOUNTER — Other Ambulatory Visit: Payer: Self-pay | Admitting: Family Medicine

## 2020-12-17 DIAGNOSIS — I10 Essential (primary) hypertension: Secondary | ICD-10-CM

## 2021-01-17 ENCOUNTER — Emergency Department (HOSPITAL_COMMUNITY): Payer: Medicare Other

## 2021-01-17 ENCOUNTER — Encounter (HOSPITAL_COMMUNITY): Payer: Self-pay

## 2021-01-17 ENCOUNTER — Other Ambulatory Visit: Payer: Self-pay

## 2021-01-17 ENCOUNTER — Inpatient Hospital Stay (HOSPITAL_COMMUNITY)
Admission: EM | Admit: 2021-01-17 | Discharge: 2021-01-23 | DRG: 683 | Disposition: A | Discharge status: Skilled Nursing Facility | Payer: Medicare Other | Attending: Internal Medicine | Admitting: Internal Medicine

## 2021-01-17 DIAGNOSIS — W19XXXA Unspecified fall, initial encounter: Secondary | ICD-10-CM

## 2021-01-17 DIAGNOSIS — S0990XA Unspecified injury of head, initial encounter: Secondary | ICD-10-CM | POA: Diagnosis not present

## 2021-01-17 DIAGNOSIS — R001 Bradycardia, unspecified: Secondary | ICD-10-CM | POA: Diagnosis present

## 2021-01-17 DIAGNOSIS — Z20822 Contact with and (suspected) exposure to covid-19: Secondary | ICD-10-CM | POA: Diagnosis present

## 2021-01-17 DIAGNOSIS — R296 Repeated falls: Secondary | ICD-10-CM | POA: Diagnosis present

## 2021-01-17 DIAGNOSIS — R531 Weakness: Secondary | ICD-10-CM | POA: Diagnosis not present

## 2021-01-17 DIAGNOSIS — I509 Heart failure, unspecified: Secondary | ICD-10-CM

## 2021-01-17 DIAGNOSIS — R000 Tachycardia, unspecified: Secondary | ICD-10-CM | POA: Diagnosis present

## 2021-01-17 DIAGNOSIS — M542 Cervicalgia: Secondary | ICD-10-CM | POA: Diagnosis not present

## 2021-01-17 DIAGNOSIS — R55 Syncope and collapse: Secondary | ICD-10-CM | POA: Diagnosis not present

## 2021-01-17 DIAGNOSIS — D696 Thrombocytopenia, unspecified: Secondary | ICD-10-CM | POA: Diagnosis present

## 2021-01-17 DIAGNOSIS — E785 Hyperlipidemia, unspecified: Secondary | ICD-10-CM | POA: Diagnosis present

## 2021-01-17 DIAGNOSIS — I739 Peripheral vascular disease, unspecified: Secondary | ICD-10-CM | POA: Diagnosis not present

## 2021-01-17 DIAGNOSIS — Z79899 Other long term (current) drug therapy: Secondary | ICD-10-CM

## 2021-01-17 DIAGNOSIS — F419 Anxiety disorder, unspecified: Secondary | ICD-10-CM | POA: Diagnosis present

## 2021-01-17 DIAGNOSIS — W010XXA Fall on same level from slipping, tripping and stumbling without subsequent striking against object, initial encounter: Secondary | ICD-10-CM | POA: Diagnosis present

## 2021-01-17 DIAGNOSIS — N179 Acute kidney failure, unspecified: Principal | ICD-10-CM | POA: Diagnosis present

## 2021-01-17 DIAGNOSIS — Z87891 Personal history of nicotine dependence: Secondary | ICD-10-CM

## 2021-01-17 DIAGNOSIS — K219 Gastro-esophageal reflux disease without esophagitis: Secondary | ICD-10-CM | POA: Diagnosis present

## 2021-01-17 DIAGNOSIS — G9389 Other specified disorders of brain: Secondary | ICD-10-CM | POA: Diagnosis not present

## 2021-01-17 DIAGNOSIS — J9811 Atelectasis: Secondary | ICD-10-CM | POA: Diagnosis present

## 2021-01-17 DIAGNOSIS — E871 Hypo-osmolality and hyponatremia: Secondary | ICD-10-CM | POA: Diagnosis present

## 2021-01-17 DIAGNOSIS — Z955 Presence of coronary angioplasty implant and graft: Secondary | ICD-10-CM

## 2021-01-17 DIAGNOSIS — E8770 Fluid overload, unspecified: Secondary | ICD-10-CM

## 2021-01-17 DIAGNOSIS — I6389 Other cerebral infarction: Secondary | ICD-10-CM | POA: Diagnosis not present

## 2021-01-17 DIAGNOSIS — Z8249 Family history of ischemic heart disease and other diseases of the circulatory system: Secondary | ICD-10-CM

## 2021-01-17 DIAGNOSIS — I10 Essential (primary) hypertension: Secondary | ICD-10-CM | POA: Diagnosis present

## 2021-01-17 DIAGNOSIS — R410 Disorientation, unspecified: Secondary | ICD-10-CM | POA: Diagnosis not present

## 2021-01-17 DIAGNOSIS — I2510 Atherosclerotic heart disease of native coronary artery without angina pectoris: Secondary | ICD-10-CM | POA: Diagnosis present

## 2021-01-17 DIAGNOSIS — Z743 Need for continuous supervision: Secondary | ICD-10-CM | POA: Diagnosis not present

## 2021-01-17 DIAGNOSIS — M47812 Spondylosis without myelopathy or radiculopathy, cervical region: Secondary | ICD-10-CM | POA: Diagnosis present

## 2021-01-17 DIAGNOSIS — I160 Hypertensive urgency: Secondary | ICD-10-CM | POA: Diagnosis present

## 2021-01-17 DIAGNOSIS — Z7982 Long term (current) use of aspirin: Secondary | ICD-10-CM

## 2021-01-17 DIAGNOSIS — R7989 Other specified abnormal findings of blood chemistry: Secondary | ICD-10-CM | POA: Diagnosis present

## 2021-01-17 DIAGNOSIS — S0993XA Unspecified injury of face, initial encounter: Secondary | ICD-10-CM | POA: Diagnosis not present

## 2021-01-17 DIAGNOSIS — Y92009 Unspecified place in unspecified non-institutional (private) residence as the place of occurrence of the external cause: Secondary | ICD-10-CM

## 2021-01-17 HISTORY — DX: Unspecified fall, initial encounter: W19.XXXA

## 2021-01-17 LAB — BASIC METABOLIC PANEL
Anion gap: 14 (ref 5–15)
BUN: 30 mg/dL — ABNORMAL HIGH (ref 8–23)
CO2: 21 mmol/L — ABNORMAL LOW (ref 22–32)
Calcium: 9.8 mg/dL (ref 8.9–10.3)
Chloride: 103 mmol/L (ref 98–111)
Creatinine, Ser: 1.66 mg/dL — ABNORMAL HIGH (ref 0.61–1.24)
GFR, Estimated: 42 mL/min — ABNORMAL LOW (ref >60)
Glucose, Bld: 93 mg/dL (ref 70–99)
Potassium: 3.7 mmol/L (ref 3.5–5.1)
Sodium: 138 mmol/L (ref 135–145)

## 2021-01-17 LAB — URINALYSIS, ROUTINE W REFLEX MICROSCOPIC
Bilirubin Urine: NEGATIVE
Glucose, UA: NEGATIVE mg/dL
Hgb urine dipstick: NEGATIVE
Ketones, ur: NEGATIVE mg/dL
Leukocytes,Ua: NEGATIVE
Nitrite: NEGATIVE
Protein, ur: NEGATIVE mg/dL
Specific Gravity, Urine: 1.02 (ref 1.005–1.030)
pH: 5.5 (ref 5.0–8.0)

## 2021-01-17 LAB — CBC WITH DIFFERENTIAL/PLATELET
Abs Immature Granulocytes: 0.02 10*3/uL (ref 0.00–0.07)
Basophils Absolute: 0 10*3/uL (ref 0.0–0.1)
Basophils Relative: 0 %
Eosinophils Absolute: 0 10*3/uL (ref 0.0–0.5)
Eosinophils Relative: 0 %
HCT: 45.7 % (ref 39.0–52.0)
Hemoglobin: 15.4 g/dL (ref 13.0–17.0)
Immature Granulocytes: 0 %
Lymphocytes Relative: 9 %
Lymphs Abs: 0.8 10*3/uL (ref 0.7–4.0)
MCH: 32.8 pg (ref 26.0–34.0)
MCHC: 33.7 g/dL (ref 30.0–36.0)
MCV: 97.4 fL (ref 80.0–100.0)
Monocytes Absolute: 0.5 10*3/uL (ref 0.1–1.0)
Monocytes Relative: 6 %
Neutro Abs: 7.7 10*3/uL (ref 1.7–7.7)
Neutrophils Relative %: 85 %
Platelets: 145 10*3/uL — ABNORMAL LOW (ref 150–400)
RBC: 4.69 MIL/uL (ref 4.22–5.81)
RDW: 13.2 % (ref 11.5–15.5)
WBC: 9.1 10*3/uL (ref 4.0–10.5)
nRBC: 0 % (ref 0.0–0.2)

## 2021-01-17 LAB — BRAIN NATRIURETIC PEPTIDE: B Natriuretic Peptide: 267.4 pg/mL — ABNORMAL HIGH (ref 0.0–100.0)

## 2021-01-17 LAB — SARS CORONAVIRUS 2 (TAT 6-24 HRS): SARS Coronavirus 2: NEGATIVE

## 2021-01-17 MED ORDER — LIDOCAINE VISCOUS HCL 2 % MT SOLN
15.0000 mL | Freq: Once | OROMUCOSAL | Status: AC
  Administered 2021-01-17: 15 mL via ORAL
  Filled 2021-01-17: qty 15

## 2021-01-17 MED ORDER — ACETAMINOPHEN 650 MG RE SUPP: 650.0000 mg | Freq: Four times a day (QID) | RECTAL | Status: DC | PRN

## 2021-01-17 MED ORDER — ASPIRIN 81 MG PO CHEW
81.0000 mg | CHEWABLE_TABLET | Freq: Every day | ORAL | Status: DC
  Administered 2021-01-17 – 2021-01-23 (×7): 81 mg via ORAL
  Filled 2021-01-17 (×7): qty 1

## 2021-01-17 MED ORDER — ENOXAPARIN SODIUM 40 MG/0.4ML ~~LOC~~ SOLN
40.0000 mg | SUBCUTANEOUS | Status: DC
  Administered 2021-01-18 – 2021-01-23 (×5): 40 mg via SUBCUTANEOUS
  Filled 2021-01-17 (×6): qty 0.4

## 2021-01-17 MED ORDER — LISINOPRIL 20 MG PO TABS
20.0000 mg | ORAL_TABLET | Freq: Every day | ORAL | Status: DC
  Administered 2021-01-17: 20 mg via ORAL
  Filled 2021-01-17: qty 1

## 2021-01-17 MED ORDER — POTASSIUM CHLORIDE CRYS ER 20 MEQ PO TBCR
40.0000 meq | EXTENDED_RELEASE_TABLET | Freq: Once | ORAL | Status: AC
  Administered 2021-01-17: 40 meq via ORAL
  Filled 2021-01-17: qty 2

## 2021-01-17 MED ORDER — HYDRALAZINE HCL 20 MG/ML IJ SOLN: 10.0000 mg | INTRAMUSCULAR | Status: DC | PRN

## 2021-01-17 MED ORDER — METOPROLOL TARTRATE 25 MG PO TABS
100.0000 mg | ORAL_TABLET | Freq: Every day | ORAL | Status: DC
  Administered 2021-01-17: 100 mg via ORAL
  Filled 2021-01-17: qty 4

## 2021-01-17 MED ORDER — ALUM & MAG HYDROXIDE-SIMETH 200-200-20 MG/5ML PO SUSP
30.0000 mL | Freq: Once | ORAL | Status: AC
  Administered 2021-01-17: 30 mL via ORAL
  Filled 2021-01-17: qty 30

## 2021-01-17 MED ORDER — ACETAMINOPHEN 325 MG PO TABS
650.0000 mg | ORAL_TABLET | Freq: Four times a day (QID) | ORAL | Status: DC | PRN
  Administered 2021-01-22 – 2021-01-23 (×2): 650 mg via ORAL
  Filled 2021-01-17 (×4): qty 2

## 2021-01-17 MED ORDER — FUROSEMIDE 10 MG/ML IJ SOLN
40.0000 mg | Freq: Once | INTRAMUSCULAR | Status: AC
  Administered 2021-01-17: 40 mg via INTRAVENOUS
  Filled 2021-01-17: qty 4

## 2021-01-17 NOTE — ED Notes (Signed)
Dinner Tray Ordered @ 1725. 

## 2021-01-17 NOTE — ED Notes (Signed)
Condom catheter in place and draining.

## 2021-01-17 NOTE — ED Notes (Signed)
Attempted pt temp, reading low, pt AxO x 4, cool to touch, pt given several warm blankets and will reevaluate shortly

## 2021-01-17 NOTE — ED Notes (Addendum)
This RN updated family member Lars Mage on patient care and treatment plan

## 2021-01-17 NOTE — ED Notes (Signed)
Update given to pts son Broadus John

## 2021-01-17 NOTE — ED Notes (Signed)
Attempted to ambulate pt with walker, pt could barely sit up in the bed without assistance from 2 staff members. Pt was assisted sitting up in bed but could barely stand with walker, pt unable to take any steps. EDP made aware

## 2021-01-17 NOTE — ED Notes (Signed)
This RN clean and changed pt, Place condom cath on pt, however refuse to change into gown

## 2021-01-17 NOTE — ED Triage Notes (Signed)
Pt comes via [facility] from home for a fall when trying to go to bathroom, mechanical fall, hit head, no LOC, c/o of neck pain

## 2021-01-17 NOTE — H&P (Signed)
History and Physical    Clinton [name] LOV:[id number] DOB: April 05, 1941 DOA: 01/17/2021  PCP: [name], NP  Patient coming from: Home.  Chief Complaint: Fall.  HPI: Clinton [name] is a 80 y.o. male with history of CAD and hypertension and arthritis had a fall at home while walking with help of his walker.  Patient states he does not know what made his fall but he fell backwards and hit his head but did not lose consciousness.  He alerted the emergency called and patient was brought to the ER.  Per report patient has been having falls recently.  Denies any chest pain or shortness of breath.  Has been having some lower extremity edema.  ED Course: In the ER CT head and cervical spine were unremarkable.  Labs show worsening renal function with creatinine 1.6 which increased from 0.9 in March 21.  Mild thrombocytopenia.  BNP was 261.  Given the lower extremity edema and elevated BNP Lasix 1 dose was given.  Given that patient has been had recurrent falls because of the fall appear admitted for further observation for near syncope.  EKG shows sinus bradycardia with heart rate around 55 bpm.  Blood pressure is also elevated with systolic more than 884 at the time of my exam.  Review of Systems: As per HPI, rest all negative.   Past Medical History:  Diagnosis Date  . Anxiety   . Arthritis   . Coronary artery disease    have an appointment with Dr Johnsie Cancel 11/15/14  . Erectile dysfunction   . Frozen shoulder   . GERD (gastroesophageal reflux disease)   . History of elevated lipids   . Hypertension   . Osteoarthritis   . Spinal stenosis 11/24/2014    Past Surgical History:  Procedure Laterality Date  . CARDIAC SURGERY     2 stents placed   . CORONARY ANGIOPLASTY     mid and proximal LAD stent 09/1999 [facility])  . posterior lumbar fusion L4-5  11-24-14     reports that he quit smoking about 46 years ago. His smoking use included cigarettes. He has a 5.00 pack-year  smoking history. He has never used smokeless tobacco. He reports current alcohol use. He reports that he does not use drugs.  No Known Allergies  Family History  Problem Relation Age of Onset  . Heart Problems Father   . Heart disease Father   . Sudden death Father     Prior to Admission medications   Medication Sig Start Date End Date Taking? Authorizing Provider  aspirin 81 MG chewable tablet Chew 81 mg by mouth daily.   Yes [provider]  calcium elemental as carbonate (BARIATRIC TUMS ULTRA) 400 MG chewable tablet Chew 1,000 mg by mouth 3 (three) times daily.   Yes [provider]  lisinopril (ZESTRIL) 20 MG tablet TAKE 1 TABLET(20 MG) BY MOUTH DAILY Patient taking differently: Take 20 mg by mouth daily. 12/18/20  Yes Burchette, Alinda Sierras, MD  metoprolol tartrate (LOPRESSOR) 50 MG tablet TAKE 2 TABLETS BY MOUTH EVERY MORNING AND 1 TABLET EVERY EVENING Patient taking differently: Take 100 mg by mouth daily. 12/18/20  Yes Burchette, Alinda Sierras, MD    Physical Exam: Constitutional: Moderately built and nourished. Vitals:   01/17/21 1515 01/17/21 1600 01/17/21 1707 01/17/21 1800  BP: 133/76 140/66 (!) 145/82 131/66  Pulse: (!) 41 (!) 41 (!) 43 (!) 41  Resp: 12 14 17 13   Temp:  TempSrc:      SpO2: 100% 100% 98% 99%  Weight:      Height:       Eyes: Anicteric no pallor. ENMT: No discharge from the ears eyes nose or mouth. Neck: No mass felt.  No neck rigidity. Respiratory: No rhonchi or crepitations. Cardiovascular: S1-S2 heard. Abdomen: Soft nontender bowel sounds present. Musculoskeletal: Multiple joint deformities.  Mild edema of the lower extremities. Skin: Chronic skin changes. Neurologic: Alert awake oriented to time place and person.  Moves all extremities. Psychiatric: Appears normal.  Normal affect.   Labs on Admission: I have personally reviewed following labs and imaging studies  CBC: Recent Labs  Lab 01/17/21 0856  WBC 9.1  NEUTROABS 7.7   HGB 15.4  HCT 45.7  MCV 97.4  PLT 657*   Basic Metabolic Panel: Recent Labs  Lab 01/17/21 0856  NA 138  K 3.7  CL 103  CO2 21*  GLUCOSE 93  BUN 30*  CREATININE 1.66*  CALCIUM 9.8   GFR: Estimated Creatinine Clearance: 37.9 mL/min (A) (by C-G formula based on SCr of 1.66 mg/dL (H)). Liver Function Tests: No results for input(s): AST, ALT, ALKPHOS, BILITOT, PROT, ALBUMIN in the last 168 hours. No results for input(s): LIPASE, AMYLASE in the last 168 hours. No results for input(s): AMMONIA in the last 168 hours. Coagulation Profile: No results for input(s): INR, PROTIME in the last 168 hours. Cardiac Enzymes: No results for input(s): CKTOTAL, CKMB, CKMBINDEX, TROPONINI in the last 168 hours. BNP (last 3 results) Recent Labs    02/17/20 1505  PROBNP 329.0*   HbA1C: No results for input(s): HGBA1C in the last 72 hours. CBG: No results for input(s): GLUCAP in the last 168 hours. Lipid Profile: No results for input(s): CHOL, HDL, LDLCALC, TRIG, CHOLHDL, LDLDIRECT in the last 72 hours. Thyroid Function Tests: No results for input(s): TSH, T4TOTAL, FREET4, T3FREE, THYROIDAB in the last 72 hours. Anemia Panel: No results for input(s): VITAMINB12, FOLATE, FERRITIN, TIBC, IRON, RETICCTPCT in the last 72 hours. Urine analysis:    Component Value Date/Time   COLORURINE YELLOW 01/17/2021 2106   APPEARANCEUR CLEAR 01/17/2021 2106   LABSPEC 1.020 01/17/2021 2106   PHURINE 5.5 01/17/2021 2106   GLUCOSEU NEGATIVE 01/17/2021 2106   HGBUR NEGATIVE 01/17/2021 2106   BILIRUBINUR NEGATIVE 01/17/2021 2106   BILIRUBINUR n 10/12/2015 1553   KETONESUR NEGATIVE 01/17/2021 2106   PROTEINUR NEGATIVE 01/17/2021 2106   UROBILINOGEN 0.2 10/12/2015 1553   UROBILINOGEN 0.2 11/11/2014 1524   NITRITE NEGATIVE 01/17/2021 2106   LEUKOCYTESUR NEGATIVE 01/17/2021 2106   Sepsis Labs: @LABRCNTIP (procalcitonin:4,lacticidven:4) ) Recent Results (from the past 240 hour(s))  SARS CORONAVIRUS 2  (TAT 6-24 HRS) Nasopharyngeal Nasopharyngeal Swab     Status: None   Collection Time: 01/17/21 12:39 PM   Specimen: Nasopharyngeal Swab  Result Value Ref Range Status   SARS Coronavirus 2 NEGATIVE NEGATIVE Final    Comment: (NOTE) SARS-CoV-2 target nucleic acids are NOT DETECTED.  The SARS-CoV-2 RNA is generally detectable in upper and lower respiratory specimens during the acute phase of infection. Negative results do not preclude SARS-CoV-2 infection, do not rule out co-infections with other pathogens, and should not be used as the sole basis for treatment or other patient management decisions. Negative results must be combined with clinical observations, patient history, and epidemiological information. The expected result is Negative.  Fact Sheet for Patients: SugarRoll.be  Fact Sheet for Healthcare Providers: https://www.woods-mathews.com/  This test is not yet approved or cleared by the [place]  FDA and  has been authorized for detection and/or diagnosis of SARS-CoV-2 by FDA under an Emergency Use Authorization (EUA). This EUA will remain  in effect (meaning this test can be used) for the duration of the COVID-19 declaration under Se ction 564(b)(1) of the Act, 21 U.S.C. section 360bbb-3(b)(1), unless the authorization is terminated or revoked sooner.  Performed at [facility], [place] [address] [place], [place] [address]      Radiological Exams on Admission: CT Head Wo Contrast  Result Date: 01/17/2021 CLINICAL DATA:  80 year old male status post fall at home. Facial trauma. EXAM: CT HEAD WITHOUT CONTRAST TECHNIQUE: Contiguous axial images were obtained from the base of the skull through the vertex without intravenous contrast. COMPARISON:  Head CT 04/16/2020. FINDINGS: Brain: Stable cerebral volume from last year. No midline shift, ventriculomegaly, mass effect, evidence of mass lesion, intracranial hemorrhage or evidence  of cortically based acute infarction. Stable gray-white matter differentiation throughout the brain. Patchy left greater than right hemisphere white matter hypodensity, with chronic involvement of the left basal ganglia. Moderate heterogeneity in the pons appears stable. And better demonstrated on the sagittal image a chronic discrete pontine lacunar infarct is stable. No cortical encephalomalacia identified. Vascular: Extensive Calcified atherosclerosis at the skull base. No suspicious intracranial vascular hyperdensity. Skull: No acute osseous abnormality identified. Sinuses/Orbits: Visualized paranasal sinuses and mastoids are stable and well pneumatized. Other: No acute orbit or scalp soft tissue injury identified. Negative visible face soft tissues. IMPRESSION: 1. No acute traumatic injury identified. 2. Chronic small vessel disease appears stable by CT from last year. Electronically Signed   By: Genevie Ann M.D.   On: 01/17/2021 05:17   CT Cervical Spine Wo Contrast  Result Date: 01/17/2021 CLINICAL DATA:  80 year old male status post fall at home. Facial trauma. EXAM: CT CERVICAL SPINE WITHOUT CONTRAST TECHNIQUE: Multidetector CT imaging of the cervical spine was performed without intravenous contrast. Multiplanar CT image reconstructions were also generated. COMPARISON:  Head CT today reported separately. Cervical spine CT 04/16/2020. FINDINGS: Alignment: Straightening of cervical lordosis today, less reversal of lordosis compared to last year. Cervicothoracic junction alignment is within normal limits. Bilateral posterior element alignment is within normal limits. Skull base and vertebrae: Visualized skull base is intact. No atlanto-occipital dissociation. C1 and C2 remain normally aligned. C2 vertebra appears stable and intact, with prominent nutrient foramina noted at the junction of the body and pedicles (on the left series 9, image 19). No acute osseous abnormality identified. Soft tissues and spinal  canal: No prevertebral fluid or swelling. No visible canal hematoma. Retropharyngeal course of the carotids in the neck with calcified atherosclerosis. Disc levels: Stable chronic cervical disc and endplate degeneration, prominent right upper cervical spine facet arthropathy. Upper chest: Visible upper thoracic levels appear grossly intact. Negative lung apices. Calcified aortic atherosclerosis. Other: Head CT today reported separately. IMPRESSION: 1. No acute traumatic injury identified in the cervical spine. 2. Stable chronic cervical spine degeneration. 3. Aortic Atherosclerosis (ICD10-I70.0). Electronically Signed   By: Genevie Ann M.D.   On: 01/17/2021 05:23   DG Chest Port 1 View  Result Date: 01/17/2021 CLINICAL DATA:  Weakness. EXAM: PORTABLE CHEST 1 VIEW COMPARISON:  11/11/2014 FINDINGS: Heart size and vascularity normal. Mild left lower lobe airspace disease likely atelectasis. Right lung clear. No effusion. IMPRESSION: Mild left lower lobe atelectasis/infiltrate. Electronically Signed   By: Franchot Gallo M.D.   On: 01/17/2021 13:18    EKG: Independently reviewed.  Sinus bradycardia with heart rate around 55 bpm.  Assessment/Plan Principal Problem:   Near syncope Active Problems:   Acute CHF (congestive heart failure) (HCC)   Hypertensive urgency   Bradycardia   Fall    1. Near syncope/fall cause not clear.  Patient is sinus bradycardia and is on metoprolol.  Will hold off metoprolol for now and restart with the lower dose with holding parameters.  Check TSH cardiac markers.  Physical therapy consult. 2. Bilateral lower extremity edema with mildly elevated BNP.  Was given 1 dose of Lasix in the ER.  Patient is not short of breath at this time.  Will check Dopplers of the lower extremity and 2D echo. 3. Acute renal failure cause not clear.  Check UA.  Will hold lisinopril for now.  In addition patient also received Lasix in the ER.  Follow metabolic panel. 4. Hypertensive urgency at the  time of my exam patient blood pressure was of more than 190.  We will keep patient on as needed IV hydralazine follow blood pressure trends.  Holding lisinopril due to worsening renal function.  Metoprolol dose may need to be decreased based on patient's heart rate.  Presently holding. 5. Number cytopenia appears to be new.  Follow CBC. 6. History of CAD denies any chest pain.  On aspirin.  Follow troponins.   DVT prophylaxis: Lovenox. Code Status: Full code. Family Communication: Discussed with patient. Disposition Plan: To be determined. Consults called: Physical therapy. Admission status: Observation.   Rise Patience MD [facility] Pager [phone].  If 7PM-7AM, please contact night-coverage [email or web address] Password [email or web address]  01/17/2021, 10:40 PM

## 2021-01-17 NOTE — ED Provider Notes (Signed)
[facility] DEPARTMENT Provider Note   CSN: [id number] Arrival date & time: 01/17/21  0413     History Chief Complaint  Patient presents with  . Fall    Clinton [name] is a 80 y.o. male.  Patient is a 80 year old male with a history of spinal stenosis, osteoarthritis, hypertension, GERD, CAD who takes aspirin and no other anticoagulation presenting today after a fall in his home. Patient reports a few days ago he was feeling generally weak due to her is arthritis and he had to have EMS come in the middle of the night to help him get up off of the couch but then he was able to ambulate to his bedroom. Last night/early this morning he was going to the bathroom and he does not know why this occurred but he started falling backwards despite using his walker and he fell to the ground hitting his head on the floor. He denies any loss of consciousness and can't remember the entire event. He does wear a life alert and was able to call for help but was not able to get up off the ground. He reports when EMS arrived the walker was on top of him. He denies any pain that is new in his legs or arms. He was having some neck pain but reports everything is made worse by having the collar on. He denies any new numbness or weakness in his arms or legs. He has not had any recent cough, congestion, shortness of breath, chest pain or abdominal pain. He does report being very sad since his wife passed away 1 year ago and since he has now been laid been living alone. He does have a home health aide that comes in 3 times a week but she has not been there as often as she had been because she has another client that is taking more time. He has not recently started any new medications. He reports he feels pretty good right now and does not feel weak at this time. He denies any urinary or bowel issues.  The history is provided by the patient and the EMS personnel.  Fall       Past Medical  History:  Diagnosis Date  . Anxiety   . Arthritis   . Coronary artery disease    have an appointment with Dr Johnsie Cancel 11/15/14  . Erectile dysfunction   . Frozen shoulder   . GERD (gastroesophageal reflux disease)   . History of elevated lipids   . Hypertension   . Osteoarthritis   . Spinal stenosis 11/24/2014    Patient Active Problem List   Diagnosis Date Noted  . Rotator cuff tear arthropathy of both shoulders 01/25/2016  . Degenerative arthritis of knee, bilateral 01/25/2016  . Status post lumbar spinal fusion 11/29/2014  . GERD (gastroesophageal reflux disease) 11/25/2014  . Spinal stenosis 11/24/2014  . CAD (coronary artery disease) 11/15/2014  . HTN (hypertension) 11/15/2014  . Hyperlipidemia 11/15/2014    Past Surgical History:  Procedure Laterality Date  . CARDIAC SURGERY     2 stents placed   . CORONARY ANGIOPLASTY     mid and proximal LAD stent 09/1999 [facility])  . posterior lumbar fusion L4-5  11-24-14       Family History  Problem Relation Age of Onset  . Heart Problems Father   . Heart disease Father   . Sudden death Father     Social History   Tobacco Use  .  Smoking status: Former Smoker    Packs/day: 0.50    Years: 10.00    Pack years: 5.00    Types: Cigarettes    Quit date: 11/11/1974    Years since quitting: 46.2  . Smokeless tobacco: Never Used  . Tobacco comment: age 83 yrs  Substance Use Topics  . Alcohol use: Yes    Comment: occasionally  . Drug use: No    Home Medications Prior to Admission medications   Medication Sig Start Date End Date Taking? Authorizing Provider  aspirin 81 MG chewable tablet Chew 81 mg by mouth daily.   Yes [provider]  calcium elemental as carbonate (BARIATRIC TUMS ULTRA) 400 MG chewable tablet Chew 1,000 mg by mouth 3 (three) times daily.   Yes [provider]  lisinopril (ZESTRIL) 20 MG tablet TAKE 1 TABLET(20 MG) BY MOUTH DAILY Patient taking differently: Take 20 mg by  mouth daily. 12/18/20  Yes Burchette, Alinda Sierras, MD  metoprolol tartrate (LOPRESSOR) 50 MG tablet TAKE 2 TABLETS BY MOUTH EVERY MORNING AND 1 TABLET EVERY EVENING Patient taking differently: Take 100 mg by mouth daily. 12/18/20  Yes Burchette, Alinda Sierras, MD    Allergies    Patient has no known allergies.  Review of Systems   Review of Systems  All other systems reviewed and are negative.   Physical Exam Updated Vital Signs BP 140/62   Pulse (!) 48   Temp 98 F (36.7 C) (Oral)   Resp 16   SpO2 100%   Physical Exam Vitals and nursing note reviewed.  Constitutional:      General: He is not in acute distress.    Appearance: Normal appearance.  HENT:     Head: Normocephalic.     Nose: Nose normal.     Mouth/Throat:     Mouth: Mucous membranes are moist.     Pharynx: Oropharynx is clear.  Eyes:     Extraocular Movements: Extraocular movements intact.     Pupils: Pupils are equal, round, and reactive to light.  Neck:     Comments: Significant kyphosis with muscular neck tenderness and limited range of motion due to significant kyphosis. Cardiovascular:     Rate and Rhythm: Normal rate.     Comments: Strong radial pulses bilaterally. Faint DP pulses present in bilateral lower extremities Abdominal:     General: Abdomen is flat.     Palpations: Abdomen is soft.     Tenderness: There is no abdominal tenderness.     Comments: Soft reducible umbilical hernia  Musculoskeletal:     Right lower leg: Edema present.     Left lower leg: Edema present.     Comments: 2+ pitting edema in the feet and lower tib-fib area bilaterally. Ecchymosis present over the dorsum of the left foot without focal tenderness. Bilateral hands with significant ecchymosis in various stages of healing  Skin:    General: Skin is warm.     Comments: Feet are cool to the touch  Neurological:     General: No focal deficit present.     Mental Status: He is alert and oriented to person, place, and time. Mental  status is at baseline.  Psychiatric:        Mood and Affect: Mood is depressed.        Behavior: Behavior normal.     ED Results / Procedures / Treatments   Labs (all labs ordered are listed, but only abnormal results are displayed) Labs Reviewed  CBC WITH DIFFERENTIAL/PLATELET  BASIC METABOLIC PANEL    EKG None  Radiology CT Head Wo Contrast  Result Date: 01/17/2021 CLINICAL DATA:  80 year old male status post fall at home. Facial trauma. EXAM: CT HEAD WITHOUT CONTRAST TECHNIQUE: Contiguous axial images were obtained from the base of the skull through the vertex without intravenous contrast. COMPARISON:  Head CT 04/16/2020. FINDINGS: Brain: Stable cerebral volume from last year. No midline shift, ventriculomegaly, mass effect, evidence of mass lesion, intracranial hemorrhage or evidence of cortically based acute infarction. Stable gray-white matter differentiation throughout the brain. Patchy left greater than right hemisphere white matter hypodensity, with chronic involvement of the left basal ganglia. Moderate heterogeneity in the pons appears stable. And better demonstrated on the sagittal image a chronic discrete pontine lacunar infarct is stable. No cortical encephalomalacia identified. Vascular: Extensive Calcified atherosclerosis at the skull base. No suspicious intracranial vascular hyperdensity. Skull: No acute osseous abnormality identified. Sinuses/Orbits: Visualized paranasal sinuses and mastoids are stable and well pneumatized. Other: No acute orbit or scalp soft tissue injury identified. Negative visible face soft tissues. IMPRESSION: 1. No acute traumatic injury identified. 2. Chronic small vessel disease appears stable by CT from last year. Electronically Signed   By: Genevie Ann M.D.   On: 01/17/2021 05:17   CT Cervical Spine Wo Contrast  Result Date: 01/17/2021 CLINICAL DATA:  80 year old male status post fall at home. Facial trauma. EXAM: CT CERVICAL SPINE WITHOUT CONTRAST  TECHNIQUE: Multidetector CT imaging of the cervical spine was performed without intravenous contrast. Multiplanar CT image reconstructions were also generated. COMPARISON:  Head CT today reported separately. Cervical spine CT 04/16/2020. FINDINGS: Alignment: Straightening of cervical lordosis today, less reversal of lordosis compared to last year. Cervicothoracic junction alignment is within normal limits. Bilateral posterior element alignment is within normal limits. Skull base and vertebrae: Visualized skull base is intact. No atlanto-occipital dissociation. C1 and C2 remain normally aligned. C2 vertebra appears stable and intact, with prominent nutrient foramina noted at the junction of the body and pedicles (on the left series 9, image 19). No acute osseous abnormality identified. Soft tissues and spinal canal: No prevertebral fluid or swelling. No visible canal hematoma. Retropharyngeal course of the carotids in the neck with calcified atherosclerosis. Disc levels: Stable chronic cervical disc and endplate degeneration, prominent right upper cervical spine facet arthropathy. Upper chest: Visible upper thoracic levels appear grossly intact. Negative lung apices. Calcified aortic atherosclerosis. Other: Head CT today reported separately. IMPRESSION: 1. No acute traumatic injury identified in the cervical spine. 2. Stable chronic cervical spine degeneration. 3. Aortic Atherosclerosis (ICD10-I70.0). Electronically Signed   By: Genevie Ann M.D.   On: 01/17/2021 05:23    Procedures Procedures   Medications Ordered in ED Medications - No data to display  ED Course  I have reviewed the triage vital signs and the nursing notes.  Pertinent labs & imaging results that were available during my care of the patient were reviewed by me and considered in my medical decision making (see chart for details).    MDM Rules/Calculators/A&P                          Elderly male with a fall at home today when he lost his  balance and fell backwards. Patient does live at home alone and uses a walker for mobility. He did report feeling weak earlier this week but denies any symptoms currently. He did hit his head during the fall but does not take anticoagulation other than 81  mg of aspirin. Patient's vital signs are reassuring. He does take metoprolol and does have sinus bradycardia. Head and cervical spine CT were negative for acute findings. C-collar was removed which made the patient feel better. He has not seen his doctor in 6 months and has not had recent lab work. Will ensure normal renal function and hemoglobin to ensure that is not the cause of him feeling weak earlier this week. Patient denies any urinary symptoms at this time. He reports he has been eating and does have some help at home. He feels like he would be able to go home safely today pending lab results. Will ensure that patient is able to ambulate prior to discharge.  2:00 PM Spoke with patient's son who reports over the last 1 week patient has significant weakness and difficulty with mobility beyond his baseline.  He has called EMS multiple times to help him get up and get to where he needs to go.  Son also noted today significant swelling in his lower extremities compared to baseline as well as worsening shortness of breath with exertion.  Patient is not forthcoming with this information.  When attempting to walk here he can stand with assistance but was unable to take any steps with a walker.  Discussing with patient he may not be safe for discharge home patient reports he does want to go home.  He is agreeable to have labs checked to ensure no evidence of new heart failure.  He did report his doctor wanted to put him on a Lasix but he was not interested in taking extra medication and feels all that his symptoms are related to arthritis.  BMP today does show elevated creatinine of 1.66 from baseline of 0.86 months ago.  CBC without acute findings.  Chest x-ray  shows mild left lower lobe atelectasis versus infiltrate.  Final Clinical Impression(s) / ED Diagnoses Final diagnoses:  None    Rx / DC Orders ED Discharge Orders    None       Blanchie Dessert, MD 01/22/21 1607

## 2021-01-18 ENCOUNTER — Observation Stay (HOSPITAL_BASED_OUTPATIENT_CLINIC_OR_DEPARTMENT_OTHER): Payer: Medicare Other

## 2021-01-18 ENCOUNTER — Observation Stay (HOSPITAL_COMMUNITY): Payer: Medicare Other

## 2021-01-18 DIAGNOSIS — Z8249 Family history of ischemic heart disease and other diseases of the circulatory system: Secondary | ICD-10-CM | POA: Diagnosis not present

## 2021-01-18 DIAGNOSIS — I429 Cardiomyopathy, unspecified: Secondary | ICD-10-CM | POA: Diagnosis not present

## 2021-01-18 DIAGNOSIS — I351 Nonrheumatic aortic (valve) insufficiency: Secondary | ICD-10-CM | POA: Diagnosis not present

## 2021-01-18 DIAGNOSIS — I5022 Chronic systolic (congestive) heart failure: Secondary | ICD-10-CM | POA: Diagnosis not present

## 2021-01-18 DIAGNOSIS — Z955 Presence of coronary angioplasty implant and graft: Secondary | ICD-10-CM | POA: Diagnosis not present

## 2021-01-18 DIAGNOSIS — R531 Weakness: Secondary | ICD-10-CM | POA: Diagnosis present

## 2021-01-18 DIAGNOSIS — M6281 Muscle weakness (generalized): Secondary | ICD-10-CM | POA: Diagnosis not present

## 2021-01-18 DIAGNOSIS — M47812 Spondylosis without myelopathy or radiculopathy, cervical region: Secondary | ICD-10-CM | POA: Diagnosis present

## 2021-01-18 DIAGNOSIS — E785 Hyperlipidemia, unspecified: Secondary | ICD-10-CM | POA: Diagnosis present

## 2021-01-18 DIAGNOSIS — I2510 Atherosclerotic heart disease of native coronary artery without angina pectoris: Secondary | ICD-10-CM | POA: Diagnosis present

## 2021-01-18 DIAGNOSIS — I160 Hypertensive urgency: Secondary | ICD-10-CM | POA: Diagnosis not present

## 2021-01-18 DIAGNOSIS — W19XXXA Unspecified fall, initial encounter: Secondary | ICD-10-CM

## 2021-01-18 DIAGNOSIS — N179 Acute kidney failure, unspecified: Principal | ICD-10-CM

## 2021-01-18 DIAGNOSIS — E871 Hypo-osmolality and hyponatremia: Secondary | ICD-10-CM | POA: Diagnosis not present

## 2021-01-18 DIAGNOSIS — I509 Heart failure, unspecified: Secondary | ICD-10-CM

## 2021-01-18 DIAGNOSIS — Z7982 Long term (current) use of aspirin: Secondary | ICD-10-CM | POA: Diagnosis not present

## 2021-01-18 DIAGNOSIS — N183 Chronic kidney disease, stage 3 unspecified: Secondary | ICD-10-CM | POA: Diagnosis not present

## 2021-01-18 DIAGNOSIS — R1311 Dysphagia, oral phase: Secondary | ICD-10-CM | POA: Diagnosis not present

## 2021-01-18 DIAGNOSIS — Z79899 Other long term (current) drug therapy: Secondary | ICD-10-CM | POA: Diagnosis not present

## 2021-01-18 DIAGNOSIS — Z20822 Contact with and (suspected) exposure to covid-19: Secondary | ICD-10-CM | POA: Diagnosis present

## 2021-01-18 DIAGNOSIS — R001 Bradycardia, unspecified: Secondary | ICD-10-CM

## 2021-01-18 DIAGNOSIS — I4891 Unspecified atrial fibrillation: Secondary | ICD-10-CM | POA: Diagnosis not present

## 2021-01-18 DIAGNOSIS — R600 Localized edema: Secondary | ICD-10-CM | POA: Diagnosis not present

## 2021-01-18 DIAGNOSIS — J9811 Atelectasis: Secondary | ICD-10-CM | POA: Diagnosis present

## 2021-01-18 DIAGNOSIS — R609 Edema, unspecified: Secondary | ICD-10-CM | POA: Diagnosis not present

## 2021-01-18 DIAGNOSIS — K219 Gastro-esophageal reflux disease without esophagitis: Secondary | ICD-10-CM | POA: Diagnosis present

## 2021-01-18 DIAGNOSIS — Z9181 History of falling: Secondary | ICD-10-CM | POA: Diagnosis not present

## 2021-01-18 DIAGNOSIS — W010XXA Fall on same level from slipping, tripping and stumbling without subsequent striking against object, initial encounter: Secondary | ICD-10-CM | POA: Diagnosis present

## 2021-01-18 DIAGNOSIS — R55 Syncope and collapse: Secondary | ICD-10-CM | POA: Diagnosis not present

## 2021-01-18 DIAGNOSIS — M2550 Pain in unspecified joint: Secondary | ICD-10-CM | POA: Diagnosis not present

## 2021-01-18 DIAGNOSIS — Z7401 Bed confinement status: Secondary | ICD-10-CM | POA: Diagnosis not present

## 2021-01-18 DIAGNOSIS — F419 Anxiety disorder, unspecified: Secondary | ICD-10-CM | POA: Diagnosis present

## 2021-01-18 DIAGNOSIS — R262 Difficulty in walking, not elsewhere classified: Secondary | ICD-10-CM | POA: Diagnosis not present

## 2021-01-18 DIAGNOSIS — R296 Repeated falls: Secondary | ICD-10-CM | POA: Diagnosis present

## 2021-01-18 DIAGNOSIS — D696 Thrombocytopenia, unspecified: Secondary | ICD-10-CM | POA: Diagnosis present

## 2021-01-18 DIAGNOSIS — Z87891 Personal history of nicotine dependence: Secondary | ICD-10-CM | POA: Diagnosis not present

## 2021-01-18 DIAGNOSIS — R2681 Unsteadiness on feet: Secondary | ICD-10-CM | POA: Diagnosis not present

## 2021-01-18 DIAGNOSIS — Y92009 Unspecified place in unspecified non-institutional (private) residence as the place of occurrence of the external cause: Secondary | ICD-10-CM | POA: Diagnosis not present

## 2021-01-18 DIAGNOSIS — R7989 Other specified abnormal findings of blood chemistry: Secondary | ICD-10-CM | POA: Diagnosis present

## 2021-01-18 DIAGNOSIS — I10 Essential (primary) hypertension: Secondary | ICD-10-CM | POA: Diagnosis present

## 2021-01-18 DIAGNOSIS — R000 Tachycardia, unspecified: Secondary | ICD-10-CM | POA: Diagnosis present

## 2021-01-18 LAB — COMPREHENSIVE METABOLIC PANEL
ALT: 15 U/L (ref 0–44)
AST: 26 U/L (ref 15–41)
Albumin: 3.3 g/dL — ABNORMAL LOW (ref 3.5–5.0)
Alkaline Phosphatase: 39 U/L (ref 38–126)
Anion gap: 13 (ref 5–15)
BUN: 30 mg/dL — ABNORMAL HIGH (ref 8–23)
CO2: 22 mmol/L (ref 22–32)
Calcium: 9.2 mg/dL (ref 8.9–10.3)
Chloride: 103 mmol/L (ref 98–111)
Creatinine, Ser: 1.74 mg/dL — ABNORMAL HIGH (ref 0.61–1.24)
GFR, Estimated: 40 mL/min — ABNORMAL LOW (ref >60)
Glucose, Bld: 88 mg/dL (ref 70–99)
Potassium: 4 mmol/L (ref 3.5–5.1)
Sodium: 138 mmol/L (ref 135–145)
Total Bilirubin: 1.4 mg/dL — ABNORMAL HIGH (ref 0.3–1.2)
Total Protein: 5.8 g/dL — ABNORMAL LOW (ref 6.5–8.1)

## 2021-01-18 LAB — CBC
HCT: 44.2 % (ref 39.0–52.0)
Hemoglobin: 14.8 g/dL (ref 13.0–17.0)
MCH: 32.6 pg (ref 26.0–34.0)
MCHC: 33.5 g/dL (ref 30.0–36.0)
MCV: 97.4 fL (ref 80.0–100.0)
Platelets: 134 10*3/uL — ABNORMAL LOW (ref 150–400)
RBC: 4.54 MIL/uL (ref 4.22–5.81)
RDW: 13.3 % (ref 11.5–15.5)
WBC: 7.3 10*3/uL (ref 4.0–10.5)
nRBC: 0 % (ref 0.0–0.2)

## 2021-01-18 LAB — ECHOCARDIOGRAM COMPLETE
Area-P 1/2: 2.07 cm2
Height: 70 in
P 1/2 time: 880 msec
S' Lateral: 2.65 cm
Weight: 2960 oz

## 2021-01-18 LAB — TROPONIN I (HIGH SENSITIVITY): Troponin I (High Sensitivity): 22 ng/L — ABNORMAL HIGH (ref <18)

## 2021-01-18 LAB — TSH: TSH: 3.418 u[IU]/mL (ref 0.350–4.500)

## 2021-01-18 MED ORDER — SODIUM CHLORIDE 0.9 % IV SOLN
INTRAVENOUS | Status: DC
  Administered 2021-01-18: 1000 mL via INTRAVENOUS

## 2021-01-18 NOTE — ED Notes (Signed)
Pt states that he just can't do anything. States he hopes that PT/OT here can " loosen up my arms."

## 2021-01-18 NOTE — Progress Notes (Signed)
  Echocardiogram 2D Echocardiogram has been performed.  Jennette Dubin 01/18/2021, 1:13 PM

## 2021-01-18 NOTE — Consult Note (Addendum)
Cardiology Consultation:   Patient ID: Clinton [name] MRN: [id number]; DOB: 1941/05/04  Admit date: 01/17/2021 Date of Consult: 01/18/2021  Primary Care Provider: [name], NP [facility] Cardiologist: Candee Furbish, MD  [facility] Electrophysiologist:  None    Patient Profile:   Clinton [name] is a 80 y.o. male with a hx of CAD s/p stenting to m/pLAD '00, HTN, HLD, and arthritis who is being seen today for the evaluation of bradycardia/syncope at the request of Dr. Starla Link.  History of Present Illness:   Clinton [name] is a 80 yo male with PMH noted above.  Underwent cardiac cath back in 2000 while living in [place]. He was seen by cardiology back in 2015 with Dr. Acie Fredrickson and cleared for back surgery.  He has not been followed by cardiology since that time.   Presented to the ED on 2/9 after a syncopal episode.  He currently lives at home independently per his report.  States he lost his wife about a year ago.  He has had several falls in the past.  Says he remembers getting up and walking into the bathroom.  Felt himself leaning backwards and thinks that he tripped over something in the floor falling and hitting his head.  Denies any frank syncope.  Denies any dizziness or lightheadedness prior to this episode.  Has a life alert button and pressed this to get EMS.   In the ED his labs showed stable electrolytes, Cr 1.74, WBC 7.3, Hgb 14.8. CXR showed mild left lower lobe atelectasis/infiltrate. CT head/spine was negative.  He was noted to be bradycardic with heart rates in the 40-50 range.  Also hypertensive with systolic blood pressures in the 190s.  Without specific complaint at the time of assessment.  Review of telemetry shows no concern for high degree AV block.  He has been on metoprolol 100 mg in the morning and 50 mg in the evening for the past 20 years per his report, also takes lisinopril.  On admission his metoprolol has been held.   Past Medical History:   Diagnosis Date  . Anxiety   . Arthritis   . Coronary artery disease    have an appointment with Dr Johnsie Cancel 11/15/14  . Erectile dysfunction   . Frozen shoulder   . GERD (gastroesophageal reflux disease)   . History of elevated lipids   . Hypertension   . Osteoarthritis   . Spinal stenosis 11/24/2014    Past Surgical History:  Procedure Laterality Date  . CARDIAC SURGERY     2 stents placed   . CORONARY ANGIOPLASTY     mid and proximal LAD stent 09/1999 [facility])  . posterior lumbar fusion L4-5  11-24-14     Home Medications:  Prior to Admission medications   Medication Sig Start Date End Date Taking? Authorizing Provider  aspirin 81 MG chewable tablet Chew 81 mg by mouth daily.   Yes [provider]  calcium elemental as carbonate (BARIATRIC TUMS ULTRA) 400 MG chewable tablet Chew 1,000 mg by mouth 3 (three) times daily.   Yes [provider]  lisinopril (ZESTRIL) 20 MG tablet TAKE 1 TABLET(20 MG) BY MOUTH DAILY Patient taking differently: Take 20 mg by mouth daily. 12/18/20  Yes Burchette, Alinda Sierras, MD  metoprolol tartrate (LOPRESSOR) 50 MG tablet TAKE 2 TABLETS BY MOUTH EVERY MORNING AND 1 TABLET EVERY EVENING Patient taking differently: Take 100 mg by mouth daily. 12/18/20  Yes Burchette, Alinda Sierras, MD    Inpatient Medications:  Scheduled Meds: . aspirin  81 mg Oral Daily  . enoxaparin (LOVENOX) injection  40 mg Subcutaneous Q24H   Continuous Infusions: . sodium chloride 1,000 mL (01/18/21 0817)   PRN Meds: acetaminophen **OR** acetaminophen, hydrALAZINE  Allergies:   No Known Allergies  Social History:   Social History   Socioeconomic History  . Marital status: Married    Spouse name: Not on file  . Number of children: Not on file  . Years of education: Not on file  . Highest education level: Not on file  Occupational History  . Not on file  Tobacco Use  . Smoking status: Former Smoker    Packs/day: 0.50    Years: 10.00    Pack  years: 5.00    Types: Cigarettes    Quit date: 11/11/1974    Years since quitting: 46.2  . Smokeless tobacco: Never Used  . Tobacco comment: age 43 yrs  Substance and Sexual Activity  . Alcohol use: Yes    Comment: occasionally  . Drug use: No  . Sexual activity: Not on file  Other Topics Concern  . Not on file  Social History Narrative  . Not on file   Social Determinants of Health   Financial Resource Strain: Not on file  Food Insecurity: Not on file  Transportation Needs: Not on file  Physical Activity: Not on file  Stress: Not on file  Social Connections: Not on file  Intimate Partner Violence: Not on file    Family History:    Family History  Problem Relation Age of Onset  . Heart Problems Father   . Heart disease Father   . Sudden death Father      ROS:  Please see the history of present illness.   All other ROS reviewed and negative.     Physical Exam/Data:   Vitals:   01/18/21 0900 01/18/21 0930 01/18/21 1000 01/18/21 1030  BP: 123/63 122/73 109/71 129/84  Pulse: (!) 51 (!) 55 (!) 53 63  Resp: 16 16 16 18   Temp:      TempSrc:      SpO2: 100% 100% 100% 98%  Weight:      Height:       No intake or output data in the 24 hours ending 01/18/21 1335 Last 3 Weights 01/17/2021 04/16/2018 04/07/2018  Weight (lbs) 185 lb 184 lb 182 lb  Weight (kg) 83.915 kg 83.462 kg 82.555 kg     Body mass index is 26.54 kg/m.  General: Frail older gentleman HEENT: normal Lymph: no adenopathy Neck: no JVD Endocrine:  No thryomegaly Vascular: No carotid bruits; FA pulses 2+ bilaterally without bruits  Cardiac:  normal S1, S2; RRR; no murmur Lungs:  clear to auscultation bilaterally, no wheezing, rhonchi or rales  Abd: soft, nontender, no hepatomegaly  Ext: no edema Musculoskeletal:  No deformities, BUE and BLE strength normal and equal Skin: warm and dry  Neuro:  CNs 2-12 intact, no focal abnormalities noted Psych:  Normal affect   EKG:  The EKG was personally  reviewed and demonstrates:  SB, 55bpm   Relevant CV Studies:  Echo: 01/18/21  IMPRESSIONS    1. Left ventricular ejection fraction, by estimation, is 55 to 60%. The  left ventricle has normal function. The left ventricle has no regional  wall motion abnormalities. There is mild concentric left ventricular  hypertrophy. Left ventricular diastolic  parameters are consistent with Grade I diastolic dysfunction (impaired  relaxation).  2. Right ventricular systolic function is normal. The  right ventricular  size is normal.  3. Left atrial size was moderately dilated.  4. Right atrial size was mildly dilated.  5. The mitral valve is grossly normal. Trivial mitral valve  regurgitation. No evidence of mitral stenosis. Moderate mitral annular  calcification.  6. The aortic valve is tricuspid. There is mild calcification of the  aortic valve. Aortic valve regurgitation is mild. No aortic stenosis is  present.   Comparison(s): No significant change from prior study.   Conclusion(s)/Recommendation(s): Otherwise normal echocardiogram, with  minor abnormalities described in the report.   Laboratory Data:  High Sensitivity Troponin:  No results for input(s): TROPONINIHS in the last 720 hours.   Chemistry Recent Labs  Lab 01/17/21 0856 01/18/21 0421  NA 138 138  K 3.7 4.0  CL 103 103  CO2 21* 22  GLUCOSE 93 88  BUN 30* 30*  CREATININE 1.66* 1.74*  CALCIUM 9.8 9.2  GFRNONAA 42* 40*  ANIONGAP 14 13    Recent Labs  Lab 01/18/21 0421  PROT 5.8*  ALBUMIN 3.3*  AST 26  ALT 15  ALKPHOS 39  BILITOT 1.4*   Hematology Recent Labs  Lab 01/17/21 0856 01/18/21 0421  WBC 9.1 7.3  RBC 4.69 4.54  HGB 15.4 14.8  HCT 45.7 44.2  MCV 97.4 97.4  MCH 32.8 32.6  MCHC 33.7 33.5  RDW 13.2 13.3  PLT 145* 134*   BNP Recent Labs  Lab 01/17/21 0856  BNP 267.4*    DDimer No results for input(s): DDIMER in the last 168 hours.   Radiology/Studies:  CT Head Wo  Contrast  Result Date: 01/17/2021 CLINICAL DATA:  80 year old male status post fall at home. Facial trauma. EXAM: CT HEAD WITHOUT CONTRAST TECHNIQUE: Contiguous axial images were obtained from the base of the skull through the vertex without intravenous contrast. COMPARISON:  Head CT 04/16/2020. FINDINGS: Brain: Stable cerebral volume from last year. No midline shift, ventriculomegaly, mass effect, evidence of mass lesion, intracranial hemorrhage or evidence of cortically based acute infarction. Stable gray-white matter differentiation throughout the brain. Patchy left greater than right hemisphere white matter hypodensity, with chronic involvement of the left basal ganglia. Moderate heterogeneity in the pons appears stable. And better demonstrated on the sagittal image a chronic discrete pontine lacunar infarct is stable. No cortical encephalomalacia identified. Vascular: Extensive Calcified atherosclerosis at the skull base. No suspicious intracranial vascular hyperdensity. Skull: No acute osseous abnormality identified. Sinuses/Orbits: Visualized paranasal sinuses and mastoids are stable and well pneumatized. Other: No acute orbit or scalp soft tissue injury identified. Negative visible face soft tissues. IMPRESSION: 1. No acute traumatic injury identified. 2. Chronic small vessel disease appears stable by CT from last year. Electronically Signed   By: Genevie Ann M.D.   On: 01/17/2021 05:17   CT Cervical Spine Wo Contrast  Result Date: 01/17/2021 CLINICAL DATA:  80 year old male status post fall at home. Facial trauma. EXAM: CT CERVICAL SPINE WITHOUT CONTRAST TECHNIQUE: Multidetector CT imaging of the cervical spine was performed without intravenous contrast. Multiplanar CT image reconstructions were also generated. COMPARISON:  Head CT today reported separately. Cervical spine CT 04/16/2020. FINDINGS: Alignment: Straightening of cervical lordosis today, less reversal of lordosis compared to last year.  Cervicothoracic junction alignment is within normal limits. Bilateral posterior element alignment is within normal limits. Skull base and vertebrae: Visualized skull base is intact. No atlanto-occipital dissociation. C1 and C2 remain normally aligned. C2 vertebra appears stable and intact, with prominent nutrient foramina noted at the junction of the body and  pedicles (on the left series 9, image 19). No acute osseous abnormality identified. Soft tissues and spinal canal: No prevertebral fluid or swelling. No visible canal hematoma. Retropharyngeal course of the carotids in the neck with calcified atherosclerosis. Disc levels: Stable chronic cervical disc and endplate degeneration, prominent right upper cervical spine facet arthropathy. Upper chest: Visible upper thoracic levels appear grossly intact. Negative lung apices. Calcified aortic atherosclerosis. Other: Head CT today reported separately. IMPRESSION: 1. No acute traumatic injury identified in the cervical spine. 2. Stable chronic cervical spine degeneration. 3. Aortic Atherosclerosis (ICD10-I70.0). Electronically Signed   By: Genevie Ann M.D.   On: 01/17/2021 05:23   DG Chest Port 1 View  Result Date: 01/17/2021 CLINICAL DATA:  Weakness. EXAM: PORTABLE CHEST 1 VIEW COMPARISON:  11/11/2014 FINDINGS: Heart size and vascularity normal. Mild left lower lobe airspace disease likely atelectasis. Right lung clear. No effusion. IMPRESSION: Mild left lower lobe atelectasis/infiltrate. Electronically Signed   By: Franchot Gallo M.D.   On: 01/17/2021 13:18   VAS [place] LOWER EXTREMITY VENOUS (DVT)  Result Date: 01/18/2021  Lower Venous DVT Study Indications: Edema.  Comparison Study: No previous exams Performing Technologist: Rogelia Rohrer  Examination Guidelines: A complete evaluation includes B-mode imaging, spectral Doppler, color Doppler, and power Doppler as needed of all accessible portions of each vessel. Bilateral testing is considered an integral part of a  complete examination. Limited examinations for reoccurring indications may be performed as noted. The reflux portion of the exam is performed with the patient in reverse Trendelenburg.  +---------+---------------+---------+-----------+----------+-------------------+ RIGHT    CompressibilityPhasicitySpontaneityPropertiesThrombus Aging      +---------+---------------+---------+-----------+----------+-------------------+ CFV      Full           Yes      Yes                                      +---------+---------------+---------+-----------+----------+-------------------+ SFJ      Full                                                             +---------+---------------+---------+-----------+----------+-------------------+ FV Prox  Full           Yes      Yes                                      +---------+---------------+---------+-----------+----------+-------------------+ FV Mid   Full           Yes      Yes                                      +---------+---------------+---------+-----------+----------+-------------------+ FV DistalFull           Yes      Yes                                      +---------+---------------+---------+-----------+----------+-------------------+ PFV      Full                                                             +---------+---------------+---------+-----------+----------+-------------------+  POP      Full           Yes      Yes                                      +---------+---------------+---------+-----------+----------+-------------------+ PTV      Full                                         Not well visualized +---------+---------------+---------+-----------+----------+-------------------+ PERO     Full                                         Not well visualized +---------+---------------+---------+-----------+----------+-------------------+    +---------+---------------+---------+-----------+----------+--------------+ LEFT     CompressibilityPhasicitySpontaneityPropertiesThrombus Aging +---------+---------------+---------+-----------+----------+--------------+ CFV      Full           Yes      Yes                                 +---------+---------------+---------+-----------+----------+--------------+ SFJ      Full                                                        +---------+---------------+---------+-----------+----------+--------------+ FV Prox  Full           Yes      Yes                                 +---------+---------------+---------+-----------+----------+--------------+ FV Mid   Full           Yes      Yes                                 +---------+---------------+---------+-----------+----------+--------------+ FV DistalFull           Yes      Yes                                 +---------+---------------+---------+-----------+----------+--------------+ PFV      Full                                                        +---------+---------------+---------+-----------+----------+--------------+ POP      Full           Yes      Yes                                 +---------+---------------+---------+-----------+----------+--------------+ PTV      Full                                                        +---------+---------------+---------+-----------+----------+--------------+  PERO     Full                                                        +---------+---------------+---------+-----------+----------+--------------+     Summary: BILATERAL: - No evidence of deep vein thrombosis seen in the lower extremities, bilaterally. - No evidence of superficial venous thrombosis in the lower extremities, bilaterally. -No evidence of popliteal cyst, bilaterally.   *See table(s) above for measurements and observations. Electronically signed by Servando Snare MD on 01/18/2021 at  1:06:35 PM.    Final      Assessment and Plan:   Clinton [name] is a 80 y.o. male with a hx of CAD s/p stenting to m/pLAD '00, HTN, HLD, and arthritis who is being seen today for the evaluation of bradycardia/syncope at the request of Dr. Starla Link.  1.  Bradycardia: Heart rate is noted in the 50s on admission.  Of note he has been on metoprolol for many years per his report.  This was held on admission.  We will continue to hold given his bradycardia and frail state.  No signs of high degree AV block noted on EKG or telemetry.  Echo with normal EF and no wall motion normality  2.  Hypertension: As above metoprolol held on admission.  Creatinine was noted to be elevated his lisinopril was held as well. --On as needed hydralazine for primary  3.  CAD status post stenting to the mid/proximal LAD: This was done in [place] back in 2000.  He has no anginal symptoms. --On aspirin  4.  Bilateral lower extremity edema: Negative lower extremity Dopplers  For questions or updates, please contact [place] Please consult [email or web address] for contact info under    Signed, Reino Bellis, NP  01/18/2021 1:35 PM  Personally seen and examined. Agree with above.  Patient had a fall at home.  Telemetry shows sinus bradycardia rate in the 50s.  Has been on metoprolol for many years, since his angioplasty 28 years ago that was performed in [place].  Overall he denies blacking out.,  No syncope.  Review telemetry in the ER-no adverse arrhythmias or pauses.  Creatinine 1.7 on lisinopril  Echocardiogram as above-normal  EKG personally reviewed sinus bradycardia.  Bradycardia fall, CAD -Stop metoprolol -If tachycardia does recur, use lower dosing. -Echocardiogram reassuring.  Telemetry reassuring. -Does not need any further cardiac testing. -Denies actually having syncope.  We will go ahead and sign off.  Please let us know if we can be of further assistance.  Continue to  follow-up with primary care.  Candee Furbish, MD

## 2021-01-18 NOTE — CV Procedure (Signed)
BLE venous duplex.  Results can be found under chart review under CV PROC. 01/18/2021 11:04 AM [name] RVT, RDMS

## 2021-01-18 NOTE — Progress Notes (Signed)
Patient ID: Clinton [name], male   DOB: Oct 16, 1941, 80 y.o.   MRN: [id number]  PROGRESS NOTE    Clinton [name]  PFX:[id number] DOB: September 24, 1941 DOA: 01/17/2021 PCP: [name], NP   Brief Narrative:  80 y.o. male with history of CAD and hypertension and arthritis had a fall at home while walking with his walker, hit his head but did not lose consciousness.  On presentation, CT of the head and cervical spine were unremarkable.  Creatinine was 1.6 from 0.9 in March 2021; BNP was 261.  He was given 1 dose of Lasix in the ED because of lower extremity edema.  He was also found to be bradycardic and kept in observation for near syncope.  Assessment & Plan:   Near syncope/fall with unclear cause Bradycardia -Patient presented with fall and possible near syncope, unclear cause and was found to be in sinus bradycardia.  CT of the head and cervical spine were unremarkable  -still bradycardic.  We will keep metoprolol on hold. -Cardiology evaluation -Fall precautions.  2D echo.  PT/OT eval  Bilateral lower extremity edema with only mildly elevated BNP -Given a dose of Lasix in the ED.  Will hold off on further Lasix.  Follow lower extremity Dopplers and 2D echo.  No signs of volume overload at this time  Acute kidney injury -Presented with creatinine of 1.6; creatinine was 0.9 in March was 121.  Creatinine 1.74 this morning. -Start gentle hydration.  Repeat a.m. creatinine.  If creatinine does not improve, will check renal ultrasound  Hypertensive urgency -Blood pressure has much improved.  Monitor blood pressure.  Metoprolol and lisinopril on hold.  Use hydralazine as needed  Thrombocytopenia -Questionable cause.  No signs of bleeding.  Monitor  History of CAD -No chest pain.  On aspirin.  Metoprolol hold   DVT prophylaxis: Lovenox Code Status: Full Family Communication: None Disposition Plan: Status is: Observation  The patient will require care spanning > 2 midnights and  should be moved to inpatient because: Inpatient level of care appropriate due to severity of illness.  Will need IV fluids.  PT eval pending.  Dispo: The patient is from: Home              Anticipated d/c is to: Home.  May need SNF placement pending PT eval              Anticipated d/c date is: 1 day              Patient currently is not medically stable to d/c.   Difficult to place patient No  Consultants: Consulted cardiology  Procedures: Echo lower extremity duplex pending  Antimicrobials: None   Subjective: Patient seen and examined at bedside.  Feels slightly better but still feels weak.  Denies worsening shortness of breath, nausea, vomiting or fever.  Objective: Vitals:   01/18/21 0600 01/18/21 0630 01/18/21 0700 01/18/21 0715  BP: 139/81 113/86 139/83 138/83  Pulse: (!) 52 (!) 44 (!) 47 (!) 49  Resp: 18 18 16 18   Temp:      TempSrc:      SpO2: 99% 100% 99% 99%  Weight:      Height:       No intake or output data in the 24 hours ending 01/18/21 1026 Filed Weights   01/17/21 1319  Weight: 83.9 kg    Examination:  General exam: Appears calm and comfortable.  Looks chronically ill.  Currently on room air. Respiratory system: Bilateral decreased breath sounds  at bases with scattered crackles Cardiovascular system: S1 & S2 heard; bradycardic Gastrointestinal system: Abdomen is nondistended, soft and nontender. Normal bowel sounds heard. Extremities: No cyanosis, clubbing; lower extremity edema present  Central nervous system: Alert and oriented. No focal neurological deficits. Moving extremities.  Poor historian Skin: Ecchymosis present over multiple areas of the body including left lower extremity and bilateral hands  psychiatry: Flat affect   Data Reviewed: I have personally reviewed following labs and imaging studies  CBC: Recent Labs  Lab 01/17/21 0856 01/18/21 0421  WBC 9.1 7.3  NEUTROABS 7.7  --   HGB 15.4 14.8  HCT 45.7 44.2  MCV 97.4 97.4  PLT  145* 443*   Basic Metabolic Panel: Recent Labs  Lab 01/17/21 0856 01/18/21 0421  NA 138 138  K 3.7 4.0  CL 103 103  CO2 21* 22  GLUCOSE 93 88  BUN 30* 30*  CREATININE 1.66* 1.74*  CALCIUM 9.8 9.2   GFR: Estimated Creatinine Clearance: 36.1 mL/min (A) (by C-G formula based on SCr of 1.74 mg/dL (H)). Liver Function Tests: Recent Labs  Lab 01/18/21 0421  AST 26  ALT 15  ALKPHOS 39  BILITOT 1.4*  PROT 5.8*  ALBUMIN 3.3*   No results for input(s): LIPASE, AMYLASE in the last 168 hours. No results for input(s): AMMONIA in the last 168 hours. Coagulation Profile: No results for input(s): INR, PROTIME in the last 168 hours. Cardiac Enzymes: No results for input(s): CKTOTAL, CKMB, CKMBINDEX, TROPONINI in the last 168 hours. BNP (last 3 results) Recent Labs    02/17/20 1505  PROBNP 329.0*   HbA1C: No results for input(s): HGBA1C in the last 72 hours. CBG: No results for input(s): GLUCAP in the last 168 hours. Lipid Profile: No results for input(s): CHOL, HDL, LDLCALC, TRIG, CHOLHDL, LDLDIRECT in the last 72 hours. Thyroid Function Tests: Recent Labs    01/18/21 0417  TSH 3.418   Anemia Panel: No results for input(s): VITAMINB12, FOLATE, FERRITIN, TIBC, IRON, RETICCTPCT in the last 72 hours. Sepsis Labs: No results for input(s): PROCALCITON, LATICACIDVEN in the last 168 hours.  Recent Results (from the past 240 hour(s))  SARS CORONAVIRUS 2 (TAT 6-24 HRS) Nasopharyngeal Nasopharyngeal Swab     Status: None   Collection Time: 01/17/21 12:39 PM   Specimen: Nasopharyngeal Swab  Result Value Ref Range Status   SARS Coronavirus 2 NEGATIVE NEGATIVE Final    Comment: (NOTE) SARS-CoV-2 target nucleic acids are NOT DETECTED.  The SARS-CoV-2 RNA is generally detectable in upper and lower respiratory specimens during the acute phase of infection. Negative results do not preclude SARS-CoV-2 infection, do not rule out co-infections with other pathogens, and should not be  used as the sole basis for treatment or other patient management decisions. Negative results must be combined with clinical observations, patient history, and epidemiological information. The expected result is Negative.  Fact Sheet for Patients: SugarRoll.be  Fact Sheet for Healthcare Providers: https://www.woods-mathews.com/  This test is not yet approved or cleared by the [place] FDA and  has been authorized for detection and/or diagnosis of SARS-CoV-2 by FDA under an Emergency Use Authorization (EUA). This EUA will remain  in effect (meaning this test can be used) for the duration of the COVID-19 declaration under Se ction 564(b)(1) of the Act, 21 U.S.C. section 360bbb-3(b)(1), unless the authorization is terminated or revoked sooner.  Performed at [facility], [place] [address], [place], [place] [address]          Radiology  Studies: CT Head Wo Contrast  Result Date: 01/17/2021 CLINICAL DATA:  80 year old male status post fall at home. Facial trauma. EXAM: CT HEAD WITHOUT CONTRAST TECHNIQUE: Contiguous axial images were obtained from the base of the skull through the vertex without intravenous contrast. COMPARISON:  Head CT 04/16/2020. FINDINGS: Brain: Stable cerebral volume from last year. No midline shift, ventriculomegaly, mass effect, evidence of mass lesion, intracranial hemorrhage or evidence of cortically based acute infarction. Stable gray-white matter differentiation throughout the brain. Patchy left greater than right hemisphere white matter hypodensity, with chronic involvement of the left basal ganglia. Moderate heterogeneity in the pons appears stable. And better demonstrated on the sagittal image a chronic discrete pontine lacunar infarct is stable. No cortical encephalomalacia identified. Vascular: Extensive Calcified atherosclerosis at the skull base. No suspicious intracranial vascular hyperdensity. Skull: No acute  osseous abnormality identified. Sinuses/Orbits: Visualized paranasal sinuses and mastoids are stable and well pneumatized. Other: No acute orbit or scalp soft tissue injury identified. Negative visible face soft tissues. IMPRESSION: 1. No acute traumatic injury identified. 2. Chronic small vessel disease appears stable by CT from last year. Electronically Signed   By: Genevie Ann M.D.   On: 01/17/2021 05:17   CT Cervical Spine Wo Contrast  Result Date: 01/17/2021 CLINICAL DATA:  80 year old male status post fall at home. Facial trauma. EXAM: CT CERVICAL SPINE WITHOUT CONTRAST TECHNIQUE: Multidetector CT imaging of the cervical spine was performed without intravenous contrast. Multiplanar CT image reconstructions were also generated. COMPARISON:  Head CT today reported separately. Cervical spine CT 04/16/2020. FINDINGS: Alignment: Straightening of cervical lordosis today, less reversal of lordosis compared to last year. Cervicothoracic junction alignment is within normal limits. Bilateral posterior element alignment is within normal limits. Skull base and vertebrae: Visualized skull base is intact. No atlanto-occipital dissociation. C1 and C2 remain normally aligned. C2 vertebra appears stable and intact, with prominent nutrient foramina noted at the junction of the body and pedicles (on the left series 9, image 19). No acute osseous abnormality identified. Soft tissues and spinal canal: No prevertebral fluid or swelling. No visible canal hematoma. Retropharyngeal course of the carotids in the neck with calcified atherosclerosis. Disc levels: Stable chronic cervical disc and endplate degeneration, prominent right upper cervical spine facet arthropathy. Upper chest: Visible upper thoracic levels appear grossly intact. Negative lung apices. Calcified aortic atherosclerosis. Other: Head CT today reported separately. IMPRESSION: 1. No acute traumatic injury identified in the cervical spine. 2. Stable chronic cervical  spine degeneration. 3. Aortic Atherosclerosis (ICD10-I70.0). Electronically Signed   By: Genevie Ann M.D.   On: 01/17/2021 05:23   DG Chest Port 1 View  Result Date: 01/17/2021 CLINICAL DATA:  Weakness. EXAM: PORTABLE CHEST 1 VIEW COMPARISON:  11/11/2014 FINDINGS: Heart size and vascularity normal. Mild left lower lobe airspace disease likely atelectasis. Right lung clear. No effusion. IMPRESSION: Mild left lower lobe atelectasis/infiltrate. Electronically Signed   By: Franchot Gallo M.D.   On: 01/17/2021 13:18        Scheduled Meds: . aspirin  81 mg Oral Daily  . enoxaparin (LOVENOX) injection  40 mg Subcutaneous Q24H   Continuous Infusions: . sodium chloride 1,000 mL (01/18/21 0817)          Aline August, MD [facility] 01/18/2021, 10:26 AM

## 2021-01-18 NOTE — ED Notes (Signed)
Copy of patients Durable Power of Attorney paperwork and Advance Directive placed at bedside.

## 2021-01-18 NOTE — ED Notes (Signed)
Breakfast Ordered 

## 2021-01-18 NOTE — Progress Notes (Signed)
01/18/21 1144  PT Evaluation Information  Last PT Received On 01/18/21  Assistance Needed +2  PT/OT/SLP Co-Evaluation/Treatment Yes  Reason for Co-Treatment Complexity of the patient's impairments (multi-system involvement);To address functional/ADL transfers;For patient/therapist safety  PT goals addressed during session Mobility/safety with mobility;Balance  History of Present Illness Clinton [name] is a 80 y.o. male with history of CAD, HTN and arthritis had a fall backwards at home while walking with RW.  Per report patient has been having falls recently. Has been having some lower extremity edema. CT head and cervical spine were unremarkable.  Labs show worsening renal function, and BP is elevated.  Precautions  Precautions Fall  Precaution Comments Fall prior to admission  Restrictions  Weight Bearing Restrictions No  Home Living  Family/patient expects to be discharged to: Private residence  Living Arrangements Alone  Available Help at Discharge Family;Personal care attendant;Available PRN/intermittently  Type of Home Apartment  Home Access Stairs to enter  Entrance Stairs-Number of Steps 1 (threshold)  Home Layout One level  Bathroom Education officer, community - 2 wheels;Hand held shower head;Other (comment) (Life Alert)  Additional Comments Aide comes 3 days/week for 2 hours  Prior Function  Level of Independence Needs assistance  Gait / Transfers Assistance Needed Uses RW for mobility  ADL's / Homemaking Assistance Needed Pt reports aide helps with cleaning and with showering. Pt reports he can get dressed on his own, but takes him alot longer.  Communication  Communication No difficulties  Pain Assessment  Pain Assessment Faces  Faces Pain Scale 2  Pain Location generalized "I have arthritis everywhere"  Pain Descriptors / Indicators Discomfort;Constant;Sore  Pain Intervention(s) Limited activity  within patient's tolerance;Monitored during session;Repositioned  Cognition  Arousal/Alertness Awake/alert  Behavior During Therapy WFL for tasks assessed/performed  Overall Cognitive Status No family/caregiver present to determine baseline cognitive functioning  Area of Impairment Attention;Following commands;Safety/judgement;Awareness  Current Attention Level Sustained  Following Commands Follows one step commands with increased time  Safety/Judgement Decreased awareness of safety;Decreased awareness of deficits  Awareness Emergent  General Comments Pt stating "I feel like the only thing I need is therapy and then I can go home...but it takes max A +2 to stand" very tangential in conversation  Upper Extremity Assessment  Upper Extremity Assessment Defer to OT evaluation  Lower Extremity Assessment  Lower Extremity Assessment Generalized weakness;RLE deficits/detail;LLE deficits/detail  RLE Deficits / Details Swelling noted in BLE in calf and feet  LLE Deficits / Details Swelling noted in BLE in calf and feet  Cervical / Trunk Assessment  Cervical / Trunk Assessment Kyphotic  Bed Mobility  Overal bed mobility Needs Assistance  Bed Mobility Rolling;Supine to Sit;Sit to Supine  Rolling Mod assist  Supine to sit Mod assist;+2 for physical assistance;+2 for safety/equipment;HOB elevated  Sit to supine Total assist;+2 for physical assistance;+2 for safety/equipment (helicopter technique used)  General bed mobility comments Assist for trunk and LEs throughout.  Transfers  Overall transfer level Needs assistance  Equipment used 2 person hand held assist  Transfers Sit to/from Stand  Sit to Stand Max assist;+2 physical assistance;+2 safety/equipment;From elevated surface (ED stretcher)  General transfer comment max A +2 to stand from ED stretcher, leans legs against stretcher for balance  Balance  Overall balance assessment Needs assistance;History of Falls  Sitting-balance support  Single extremity supported;Bilateral upper extremity supported;Feet unsupported  Sitting balance-Leahy Scale Poor  Sitting balance - Comments able to progress from max A to min  A EOB  Postural control Posterior lean  Standing balance support Bilateral upper extremity supported  Standing balance-Leahy Scale Zero  Standing balance comment dependent on posterior support of ED strecher and 2 person HHA  General Comments  General comments (skin integrity, edema, etc.) VSS on RA  PT - End of Session  Equipment Utilized During Treatment Gait belt  Activity Tolerance Patient tolerated treatment well  Patient left in bed;with call bell/phone within reach (on stretcher in ED)  Nurse Communication Mobility status  PT Assessment  PT Recommendation/Assessment Patient needs continued PT services  PT Visit Diagnosis Muscle weakness (generalized) (M62.81);History of falling (Z91.81);Repeated falls (R29.6);Unsteadiness on feet (R26.81)  PT Problem List Decreased strength;Decreased balance;Decreased activity tolerance;Decreased mobility;Decreased cognition;Decreased safety awareness;Decreased knowledge of use of DME;Decreased knowledge of precautions  Barriers to Discharge Decreased caregiver support  PT Plan  PT Frequency (ACUTE ONLY) Min 2X/week  PT Treatment/Interventions (ACUTE ONLY) DME instruction;Gait training;Functional mobility training;Therapeutic activities;Therapeutic exercise;Balance training;Patient/family education  AM-PAC PT "6 Clicks" Mobility Outcome Measure (Version 2)  Help needed turning from your back to your side while in a flat bed without using bedrails? 2  Help needed moving from lying on your back to sitting on the side of a flat bed without using bedrails? 2  Help needed moving to and from a bed to a chair (including a wheelchair)? 1  Help needed standing up from a chair using your arms (e.g., wheelchair or bedside chair)? 2  Help needed to walk in hospital room? 1  Help needed  climbing 3-5 steps with a railing?  1  6 Click Score 9  Consider Recommendation of Discharge To: CIR/SNF/LTACH  PT Recommendation  Follow Up Recommendations SNF;Supervision/Assistance - 24 hour  PT equipment Wheelchair (measurements PT);Wheelchair cushion (measurements PT)  Individuals Consulted  Consulted and Agree with Results and Recommendations Patient  Acute Rehab PT Goals  Patient Stated Goal "to move my arms better"  PT Goal Formulation With patient  Time For Goal Achievement 02/01/21  Potential to Achieve Goals Good  PT Time Calculation  PT Start Time (ACUTE ONLY) 0926  PT Stop Time (ACUTE ONLY) 0955  PT Time Calculation (min) (ACUTE ONLY) 29 min  PT General Charges  $$ ACUTE PT VISIT 1 Visit  PT Evaluation  $PT Eval Moderate Complexity 1 Mod  Written Expression  Dominant Hand Left    Pt admitted secondary to problem above with deficits above. Pt requiring mod A + to sit at EOB. Requiring max A +2 to stand and unable to take steps at EOB. Pt currently lives alone and is at increased risk for falls. Recommending SNF level therapies at d/c. Will continue to follow acutely.   Reuel Derby, PT, DPT  Acute Rehabilitation Services  Pager: [phone] Office: [phone]

## 2021-01-18 NOTE — ED Notes (Signed)
Pt's BP was 192/69. Informed Nikki-RN

## 2021-01-18 NOTE — Evaluation (Signed)
Occupational Therapy Evaluation Patient Details Name: Olufemi Mofield MRN: [id number] DOB: 10-24-1941 Today's Date: 01/18/2021    History of Present Illness Chanoch Mccleery is a 80 y.o. male with history of CAD, HTN and arthritis had a fall backwards at home while walking with RW.  Per report patient has been having falls recently. Has been having some lower extremity edema. CT head and cervical spine were unremarkable.  Labs show worsening renal function, and BP is elevated.   Clinical Impression   Pt reports using RW at home, has an aide that comes 3x a week for 2 hours and assists with meal prep, bathing, dressing, cleaning "everything" TOday Pt is max A +2 for bed mobility and transfers from ED stretcher, unable to take steps at all and dependent on support from therapy team as well as posterior support from stretcher. Pt Today was total A for peri care (BM) at bed level and able to assist with rolling. Pt with VERY limited ROM in BUE (shoulders). OT will continue to follow acutely and at this time recommending SNF post-acute to maximize safety and independence in ADL and functional transfers.     Follow Up Recommendations  SNF;Supervision/Assistance - 24 hour    Equipment Recommendations  3 in 1 bedside commode;Other (comment) (defer to next venue of care)    Recommendations for Other Services       Precautions / Restrictions Precautions Precautions: Fall Precaution Comments: Fall prior to admission Restrictions Weight Bearing Restrictions: No      Mobility Bed Mobility Overal bed mobility: Needs Assistance Bed Mobility: Rolling;Supine to Sit;Sit to Supine Rolling: Mod assist   Supine to sit: Mod assist;+2 for physical assistance;+2 for safety/equipment;HOB elevated (Assist to bring BLE to EOB, assit for trunk elevation) Sit to supine: Total assist;+2 for physical assistance;+2 for safety/equipment (helicopter technique used)        Transfers Overall transfer  level: Needs assistance Equipment used: 2 person hand held assist Transfers: Sit to/from Stand Sit to Stand: Max assist;+2 physical assistance;+2 safety/equipment;From elevated surface (ED stretcher)         General transfer comment: max A +2 to stand from ED stretcher, leans legs against stretcher for balance    Balance Overall balance assessment: Needs assistance;History of Falls Sitting-balance support: Single extremity supported;Bilateral upper extremity supported;Feet unsupported Sitting balance-Leahy Scale: Poor Sitting balance - Comments: able to progress from max A to min A EOB Postural control: Posterior lean Standing balance support: Bilateral upper extremity supported Standing balance-Leahy Scale: Zero Standing balance comment: dependent on posterior support of ED strecher and 2 person HHA                           ADL either performed or assessed with clinical judgement   ADL Overall ADL's : Needs assistance/impaired Eating/Feeding: Set up;Sitting   Grooming: Set up;Sitting   Upper Body Bathing: Moderate assistance   Lower Body Bathing: Maximal assistance   Upper Body Dressing : Moderate assistance   Lower Body Dressing: Maximal assistance   Toilet Transfer: Maximal assistance;+2 for physical assistance;+2 for safety/equipment Toilet Transfer Details (indicate cue type and reason): unable to perform pivotal steps this session Toileting- Clothing Manipulation and Hygiene: Maximal assistance;Bed level       Functional mobility during ADLs: Maximal assistance;+2 for safety/equipment;+2 for physical assistance General ADL Comments: decreased ROM of BUE at shoudler, decreased activity tolerance, decreased balance and safety awareness     Vision  Perception     Praxis      Pertinent Vitals/Pain Pain Assessment: Faces Faces Pain Scale: Hurts a little bit Pain Location: generalized "I have arthritis everywhere" Pain Descriptors /  Indicators: Discomfort;Constant;Sore Pain Intervention(s): Limited activity within patient's tolerance;Monitored during session;Repositioned     Hand Dominance Left   Extremity/Trunk Assessment Upper Extremity Assessment Upper Extremity Assessment: RUE deficits/detail;LUE deficits/detail;Generalized weakness RUE Deficits / Details: only able to perform 10-15 degrees of abduction, about 30 degrees FF, VERY VERY Limited shoulder ROM, can bring hand to nose/mouth - elbow WFL, hand is very discolored, reports sensation WFL LUE Deficits / Details: only able to perform 10-15 degrees of abduction, about 30 degrees FF, VERY VERY Limited shoulder ROM, can bring hand to nose/mouth - elbow WFL, hand is very discolored, reports sensation WFL   Lower Extremity Assessment Lower Extremity Assessment: Defer to PT evaluation (BLE edema noted)   Cervical / Trunk Assessment Cervical / Trunk Assessment: Kyphotic   Communication Communication Communication: No difficulties   Cognition Arousal/Alertness: Awake/alert Behavior During Therapy: WFL for tasks assessed/performed Overall Cognitive Status: No family/caregiver present to determine baseline cognitive functioning Area of Impairment: Attention;Following commands;Safety/judgement;Awareness                   Current Attention Level: Sustained   Following Commands: Follows one step commands with increased time Safety/Judgement: Decreased awareness of safety;Decreased awareness of deficits Awareness: Emergent   General Comments: Pt stating "I feel like the only thing I need is therapy and then I can go home...but it takes max A +2 to stand" very tangential in conversation   General Comments  VSS on RA    Exercises     Shoulder Instructions      Home Living Family/patient expects to be discharged to:: Private residence Living Arrangements: Alone Available Help at Discharge: Family;Personal care attendant;Available  PRN/intermittently Type of Home: Apartment Home Access: Stairs to enter Entrance Stairs-Number of Steps: 1 (threshold)   Home Layout: One level     Bathroom Shower/Tub: Teacher, early years/pre: Standard     Home Equipment: Clinical cytogeneticist - 2 wheels;Hand held shower head;Other (comment) (Life Alert)   Additional Comments: Aide comes 3 days/week for 2 hours      Prior Functioning/Environment Level of Independence: Needs assistance  Gait / Transfers Assistance Needed: Uses RW for mobility ADL's / Homemaking Assistance Needed: Pt reports aide helps with cleaning and with showering. Pt reports he can get dressed on his own, but takes him alot longer.            OT Problem List: Decreased strength;Decreased range of motion;Decreased activity tolerance;Impaired balance (sitting and/or standing);Decreased cognition;Decreased safety awareness;Impaired UE functional use      OT Treatment/Interventions: Self-care/ADL training;Therapeutic exercise;DME and/or AE instruction;Therapeutic activities;Patient/family education;Balance training    OT Goals(Current goals can be found in the care plan section) Acute Rehab OT Goals Patient Stated Goal: "to move my arms better" OT Goal Formulation: With patient Time For Goal Achievement: 02/01/21 Potential to Achieve Goals: Fair  OT Frequency: Min 2X/week   Barriers to D/C: Decreased caregiver support  Pt lives alone, 24/7 supervision is not available       Co-evaluation PT/OT/SLP Co-Evaluation/Treatment: Yes Reason for Co-Treatment: Complexity of the patient's impairments (multi-system involvement);To address functional/ADL transfers;For patient/therapist safety PT goals addressed during session: Mobility/safety with mobility;Balance;Strengthening/ROM OT goals addressed during session: ADL's and self-care;Strengthening/ROM      AM-PAC OT "6 Clicks" Daily Activity     Outcome Measure  Help from another person eating meals?:  A Little Help from another person taking care of personal grooming?: A Little Help from another person toileting, which includes using toliet, bedpan, or urinal?: A Lot Help from another person bathing (including washing, rinsing, drying)?: A Lot Help from another person to put on and taking off regular upper body clothing?: A Lot Help from another person to put on and taking off regular lower body clothing?: Total 6 Click Score: 13   End of Session Equipment Utilized During Treatment: Gait belt Nurse Communication: Mobility status (requests fresh cup of coffee)  Activity Tolerance: Patient tolerated treatment well;Patient limited by fatigue Patient left: with call bell/phone within reach;Other (comment) (MD in room, on ED stretcher both rails up)  OT Visit Diagnosis: Unsteadiness on feet (R26.81);Other abnormalities of gait and mobility (R26.89);Repeated falls (R29.6);History of falling (Z91.81);Muscle weakness (generalized) (M62.81);Other symptoms and signs involving cognitive function                Time: 6742-5525 OT Time Calculation (min): 28 min Charges:  OT General Charges $OT Visit: 1 Visit OT Evaluation $OT Eval Moderate Complexity: [place] OTR/L Acute Rehabilitation Services Pager: [phone] Office: [place] 01/18/2021, 11:18 AM

## 2021-01-19 ENCOUNTER — Encounter (HOSPITAL_COMMUNITY): Payer: Self-pay | Admitting: Internal Medicine

## 2021-01-19 DIAGNOSIS — R001 Bradycardia, unspecified: Secondary | ICD-10-CM | POA: Diagnosis not present

## 2021-01-19 DIAGNOSIS — I160 Hypertensive urgency: Secondary | ICD-10-CM | POA: Diagnosis not present

## 2021-01-19 DIAGNOSIS — R55 Syncope and collapse: Secondary | ICD-10-CM | POA: Diagnosis not present

## 2021-01-19 LAB — CBC WITH DIFFERENTIAL/PLATELET
Abs Immature Granulocytes: 0.02 10*3/uL (ref 0.00–0.07)
Basophils Absolute: 0 10*3/uL (ref 0.0–0.1)
Basophils Relative: 0 %
Eosinophils Absolute: 0.1 10*3/uL (ref 0.0–0.5)
Eosinophils Relative: 2 %
HCT: 41.8 % (ref 39.0–52.0)
Hemoglobin: 13.8 g/dL (ref 13.0–17.0)
Immature Granulocytes: 0 %
Lymphocytes Relative: 19 %
Lymphs Abs: 0.9 10*3/uL (ref 0.7–4.0)
MCH: 32.5 pg (ref 26.0–34.0)
MCHC: 33 g/dL (ref 30.0–36.0)
MCV: 98.6 fL (ref 80.0–100.0)
Monocytes Absolute: 0.3 10*3/uL (ref 0.1–1.0)
Monocytes Relative: 7 %
Neutro Abs: 3.4 10*3/uL (ref 1.7–7.7)
Neutrophils Relative %: 72 %
Platelets: 122 10*3/uL — ABNORMAL LOW (ref 150–400)
RBC: 4.24 MIL/uL (ref 4.22–5.81)
RDW: 13.3 % (ref 11.5–15.5)
WBC: 4.8 10*3/uL (ref 4.0–10.5)
nRBC: 0 % (ref 0.0–0.2)

## 2021-01-19 LAB — COMPREHENSIVE METABOLIC PANEL
ALT: 13 U/L (ref 0–44)
AST: 21 U/L (ref 15–41)
Albumin: 3 g/dL — ABNORMAL LOW (ref 3.5–5.0)
Alkaline Phosphatase: 36 U/L — ABNORMAL LOW (ref 38–126)
Anion gap: 11 (ref 5–15)
BUN: 26 mg/dL — ABNORMAL HIGH (ref 8–23)
CO2: 20 mmol/L — ABNORMAL LOW (ref 22–32)
Calcium: 8.5 mg/dL — ABNORMAL LOW (ref 8.9–10.3)
Chloride: 103 mmol/L (ref 98–111)
Creatinine, Ser: 1.35 mg/dL — ABNORMAL HIGH (ref 0.61–1.24)
GFR, Estimated: 54 mL/min — ABNORMAL LOW (ref >60)
Glucose, Bld: 79 mg/dL (ref 70–99)
Potassium: 3.6 mmol/L (ref 3.5–5.1)
Sodium: 134 mmol/L — ABNORMAL LOW (ref 135–145)
Total Bilirubin: 1 mg/dL (ref 0.3–1.2)
Total Protein: 5.6 g/dL — ABNORMAL LOW (ref 6.5–8.1)

## 2021-01-19 LAB — MAGNESIUM: Magnesium: 1.9 mg/dL (ref 1.7–2.4)

## 2021-01-19 MED ORDER — AMLODIPINE BESYLATE 10 MG PO TABS
10.0000 mg | ORAL_TABLET | Freq: Every day | ORAL | Status: DC
  Administered 2021-01-19 – 2021-01-23 (×5): 10 mg via ORAL
  Filled 2021-01-19 (×4): qty 1
  Filled 2021-01-19: qty 2

## 2021-01-19 MED ORDER — DICLOFENAC SODIUM 1 % EX GEL
2.0000 g | Freq: Four times a day (QID) | CUTANEOUS | Status: DC | PRN
  Administered 2021-01-20 – 2021-01-21 (×2): 2 g via TOPICAL
  Filled 2021-01-19: qty 100

## 2021-01-19 NOTE — ED Notes (Signed)
Breakfast ordered 

## 2021-01-19 NOTE — ED Notes (Signed)
Lunch Tray Ordered @ 1030. 

## 2021-01-19 NOTE — NC FL2 (Signed)
[place] LEVEL OF CARE SCREENING TOOL     IDENTIFICATION  Patient Name: Clinton [name] Birthdate: 11/11/1941 Sex: male Admission Date (Current Location): 01/17/2021  [facility] and [place] Number:      Facility and Address:  [facility], [place] [address], [place], [place] [address]      Provider Number: [id number]  Attending Physician Name and Address:  Aline August, MD  Relative Name and Phone Number:  [place], [place] [phone])   [phone] [facility] Phone)    Current Level of Care: Hospital Recommended Level of Care: [place] Prior Approval Number: [id number]  Date Approved/Denied: 01/19/21 PASRR Number:    Discharge Plan: SNF    Current Diagnoses: Patient Active Problem List   Diagnosis Date Noted  . Acute CHF (congestive heart failure) ([place]) 01/17/2021  . Hypertensive urgency 01/17/2021  . Bradycardia 01/17/2021  . Fall 01/17/2021  . Near syncope 01/17/2021  . AKI (acute kidney injury) ([place])   . Rotator cuff tear arthropathy of both shoulders 01/25/2016  . Degenerative arthritis of knee, bilateral 01/25/2016  . Status post lumbar spinal fusion 11/29/2014  . GERD (gastroesophageal reflux disease) 11/25/2014  . Spinal stenosis 11/24/2014  . CAD (coronary artery disease) 11/15/2014  . HTN (hypertension) 11/15/2014  . Hyperlipidemia 11/15/2014    Orientation RESPIRATION BLADDER Height & Weight     Self,Time,Situation,Place  Normal Continent Weight: 162 lb 8 oz (73.7 kg) Height:  5\' 10"  (177.8 cm)  BEHAVIORAL SYMPTOMS/MOOD NEUROLOGICAL BOWEL NUTRITION STATUS      Continent Diet (see d/c summary)  AMBULATORY STATUS COMMUNICATION OF NEEDS Skin   Extensive Assist Verbally PU Stage and Appropriate Care (PU Buttocks, bilateral, unblanchable)                       Personal Care Assistance Level of Assistance  Bathing,Feeding,Dressing Bathing Assistance: Limited assistance Feeding assistance:  Independent Dressing Assistance: Limited assistance     Functional Limitations Info  Sight,Hearing,Speech Sight Info: Adequate Hearing Info: Adequate Speech Info: Adequate    SPECIAL CARE FACTORS FREQUENCY  PT (By licensed PT),OT (By licensed OT)     PT Frequency: 5x/week OT Frequency: 5x/week            Contractures Contractures Info: Not present    Additional Factors Info  Code Status,Allergies Code Status Info: FULL Allergies Info: no known allergies           Current Medications (01/19/2021):  This is the current hospital active medication list Current Facility-Administered Medications  Medication Dose Route Frequency Provider Last Rate Last Admin  . acetaminophen (TYLENOL) tablet 650 mg  650 mg Oral Q6H PRN Rise Patience, MD       Or  . acetaminophen (TYLENOL) suppository 650 mg  650 mg Rectal Q6H PRN Rise Patience, MD      . amLODipine (NORVASC) tablet 10 mg  10 mg Oral Daily Aline August, MD   10 mg at 01/19/21 0957  . aspirin chewable tablet 81 mg  81 mg Oral Daily Rise Patience, MD   81 mg at 01/19/21 0957  . enoxaparin (LOVENOX) injection 40 mg  40 mg Subcutaneous Q24H Rise Patience, MD   40 mg at 01/19/21 9758  . hydrALAZINE (APRESOLINE) injection 10 mg  10 mg Intravenous Q4H PRN Rise Patience, MD         Discharge Medications: Please see discharge summary for a list of discharge medications.  Relevant Imaging Results:  Relevant  Lab Results:   Additional Information SSN Kerman Hewlett, [place]

## 2021-01-19 NOTE — TOC Initial Note (Signed)
Transition of Care [facility]) - Initial/Assessment Note    Patient Details  Name: Clinton [name] MRN: [id number] Date of Birth: [birth year]  Transition of [facility]) CM/SW Contact:    Bethann Berkshire, [place] Phone Number: 01/19/2021, 3:48 PM  Clinical Narrative:                  CSW met with pt to discuss SNF recommendation. Pt expresses he wants to return home. CSW explains PT's recommendation and process for finding a SNF. Pt seems more agreeable. Pt is talkative and shares about history of childhood, college, and marriage. Pt reports he has 2 sons, one who is in [place] and one in [place]. Pt does provide verbal consent to contact his son's to update on SNF process. Pt has a walker at home but does not report any other DME. He states he has an Aide come for a couple of hours 3 days/week but recently the Aide has been unable to come some days. Pt is agreeable to SNF work up at this time. Fl2 completed and bed requests sent in the hub.  Expected Discharge Plan: Skilled Nursing Facility Barriers to Discharge: Continued Medical Work up   Patient Goals and CMS Choice Patient states their goals for this hospitalization and ongoing recovery are:: To eventually return. Pt is okay with SNF for rehab prior to home      Expected Discharge Plan and Services Expected Discharge Plan: [place] arrangements for the past 2 months: Apartment                                      Prior Living Arrangements/Services Living arrangements for the past 2 months: Apartment Lives with:: Self Patient language and need for interpreter reviewed:: Yes Do you feel safe going back to the place where you live?: Yes      Need for Family Participation in Patient Care: No (Comment) Care giver support system in place?: Yes (comment) Current home services: Homehealth aide Criminal Activity/Legal Involvement Pertinent to Current Situation/Hospitalization: No - Comment as  needed  Activities of Daily Living Home Assistive Devices/Equipment: Walker (specify type) ADL Screening (condition at time of admission) Patient's cognitive ability adequate to safely complete daily activities?: No Is the patient deaf or have difficulty hearing?: No Does the patient have difficulty seeing, even when wearing glasses/contacts?: No Does the patient have difficulty concentrating, remembering, or making decisions?: Yes Patient able to express need for assistance with ADLs?: Yes Does the patient have difficulty dressing or bathing?: No Independently performs ADLs?: Yes (appropriate for developmental age) Does the patient have difficulty walking or climbing stairs?: Yes Weakness of Legs: Both Weakness of Arms/Hands: None  Permission Sought/Granted   Permission granted to share information with : Yes, Verbal Permission Granted  Share Information with NAME: Eagen,Peter (Son)   [phone] (Home Phone)           Emotional Assessment Appearance:: Appears stated age Attitude/Demeanor/Rapport: Engaged (Talkative) Affect (typically observed): Accepting Orientation: : Oriented to Self,Oriented to Place,Oriented to  Time,Oriented to Situation Alcohol / Substance Use: Not Applicable Psych Involvement: No (comment)  Admission diagnosis:  Bradycardia [R00.1] Acute CHF (congestive heart failure) (HCC) [I50.9] AKI (acute kidney injury) ([place]) [N17.9] Near syncope [R55] Hypervolemia, unspecified hypervolemia type [E87.70] Patient Active Problem List   Diagnosis Date Noted  . Acute CHF (congestive heart failure) ([place]) 01/17/2021  .  Hypertensive urgency 01/17/2021  . Bradycardia 01/17/2021  . Fall 01/17/2021  . Near syncope 01/17/2021  . AKI (acute kidney injury) ([place])   . Rotator cuff tear arthropathy of both shoulders 01/25/2016  . Degenerative arthritis of knee, bilateral 01/25/2016  . Status post lumbar spinal fusion 11/29/2014  . GERD (gastroesophageal reflux disease)  11/25/2014  . Spinal stenosis 11/24/2014  . CAD (coronary artery disease) 11/15/2014  . HTN (hypertension) 11/15/2014  . Hyperlipidemia 11/15/2014   PCP:  [name], NP Pharmacy:   [facility] [place], [address] AT [address] [place] [address] Phone: [phone] Fax: [place] [address] [place], [place] [address] [place] [address] [place] [address] Phone: [phone] Fax: [phone]     Social Determinants of Health (SDOH) Interventions    Readmission Risk Interventions No flowsheet data found.

## 2021-01-19 NOTE — Progress Notes (Signed)
Patient ID: Clinton [name], male   DOB: 1941-11-02, 80 y.o.   MRN: [id number]  PROGRESS NOTE    Ramie Erman  WUJ:[id number] DOB: [birth year] DOA: 01/17/2021 PCP: [name], NP   Brief Narrative:  80 y.o. male with history of CAD and hypertension and arthritis had a fall at home while walking with his walker, hit his head but did not lose consciousness.  On presentation, CT of the head and cervical spine were unremarkable.  Creatinine was 1.6 from 0.9 in March 2021; BNP was 261.  He was given 1 dose of Lasix in the ED because of lower extremity edema.  He was also found to be bradycardic and kept in observation for near syncope.  Assessment & Plan:   Near syncope/fall with unclear cause Bradycardia -Patient presented with fall and possible near syncope, unclear cause and was found to be in sinus bradycardia.  CT of the head and cervical spine were unremarkable  -still bradycardic intermittently.   -Cardiology evaluation appreciated: Metoprolol has been stopped.  If tachycardia does recur, use lower dosing.  Echo showed EF of 55 to 60% with grade 1 diastolic dysfunction.  Cardiology signed off on 01/18/2021 -Fall precautions.   PT/OT recommended SNF.  Social worker consult  Bilateral lower extremity edema with only mildly elevated BNP -Given a dose of Lasix in the ED.  Will hold off on further Lasix.  Lower extremity Dopplers were negative for DVT.  Echo as above.  No evidence of volume overload at this time  Acute kidney injury -Presented with creatinine of 1.6; creatinine was 0.9 in March was 121.  Creatinine 1.35 this morning. -DC IV fluids.  Encourage oral intake  Mild hyponatremia -Monitor.  Encourage oral intake  Hypertensive urgency -Blood pressure elevated.  Monitor blood pressure.  Metoprolol and lisinopril on hold.  Use hydralazine as needed.  Will start amlodipine.  Thrombocytopenia -Questionable cause.  No signs of bleeding.  Monitor  History of CAD -No chest  pain.  On aspirin.  Metoprolol hold   DVT prophylaxis: Lovenox Code Status: Full Family Communication: None Disposition Plan: Status is: Inpatient because: Inpatient level of care appropriate due to severity of illness.   Dispo: The patient is from: Home              Anticipated d/c is to: SNF               Anticipated d/c date is: 1 day              Patient currently is not medically stable to d/c.   Difficult to place patient No  Consultants: cardiology  Procedures: Echo as above  Antimicrobials: None   Subjective: Patient seen and examined at bedside.  No overnight fever, vomiting, worsening shortness of breath reported.  Feels better but feels weak.  Wants to go home.  Objective: Vitals:   01/18/21 2000 01/19/21 0052 01/19/21 0406 01/19/21 0553  BP: (!) 156/75 (!) 150/76 (!) 152/117 (!) 165/86  Pulse: (!) 56 (!) 42 (!) 52 (!) 52  Resp: 16 16 17 13   Temp: 98.4 F (36.9 C) 98.7 F (37.1 C) 98.9 F (37.2 C) 97.7 F (36.5 C)  TempSrc: Oral Oral Oral Oral  SpO2: 97% 100% 100% 100%  Weight:      Height:        Intake/Output Summary (Last 24 hours) at 01/19/2021 0751 Last data filed at 01/19/2021 0500 Gross per 24 hour  Intake 240 ml  Output 450 ml  Net -210 ml   Filed Weights   01/17/21 1319 01/18/21 1608  Weight: 83.9 kg 83.9 kg    Examination:  General exam: Elderly male lying in bed.  Looks chronically ill.  Still on room air. Respiratory system: Decreased breath sounds at bases bilaterally with some crackles Cardiovascular system: Intermittently bradycardic; S1-S2 heard Gastrointestinal system: Abdomen is nondistended, soft and nontender.  Bowel sounds are heard  extremities: Mild lower extremity edema present; no clubbing Central nervous system: Awake and alert; slow to respond.  No focal neurological deficits.  Moves extremities.  Poor historian Skin: Ecchymosis present over multiple areas of the body including left lower extremity and bilateral hands.   No ulcers psychiatry: Affect is flat   Data Reviewed: I have personally reviewed following labs and imaging studies  CBC: Recent Labs  Lab 01/17/21 0856 01/18/21 0421 01/19/21 0316  WBC 9.1 7.3 4.8  NEUTROABS 7.7  --  3.4  HGB 15.4 14.8 13.8  HCT 45.7 44.2 41.8  MCV 97.4 97.4 98.6  PLT 145* 134* 063*   Basic Metabolic Panel: Recent Labs  Lab 01/17/21 0856 01/18/21 0421 01/19/21 0316  NA 138 138 134*  K 3.7 4.0 3.6  CL 103 103 103  CO2 21* 22 20*  GLUCOSE 93 88 79  BUN 30* 30* 26*  CREATININE 1.66* 1.74* 1.35*  CALCIUM 9.8 9.2 8.5*  MG  --   --  1.9   GFR: Estimated Creatinine Clearance: 46.6 mL/min (A) (by C-G formula based on SCr of 1.35 mg/dL (H)). Liver Function Tests: Recent Labs  Lab 01/18/21 0421 01/19/21 0316  AST 26 21  ALT 15 13  ALKPHOS 39 36*  BILITOT 1.4* 1.0  PROT 5.8* 5.6*  ALBUMIN 3.3* 3.0*   No results for input(s): LIPASE, AMYLASE in the last 168 hours. No results for input(s): AMMONIA in the last 168 hours. Coagulation Profile: No results for input(s): INR, PROTIME in the last 168 hours. Cardiac Enzymes: No results for input(s): CKTOTAL, CKMB, CKMBINDEX, TROPONINI in the last 168 hours. BNP (last 3 results) Recent Labs    02/17/20 1505  PROBNP 329.0*   HbA1C: No results for input(s): HGBA1C in the last 72 hours. CBG: No results for input(s): GLUCAP in the last 168 hours. Lipid Profile: No results for input(s): CHOL, HDL, LDLCALC, TRIG, CHOLHDL, LDLDIRECT in the last 72 hours. Thyroid Function Tests: Recent Labs    01/18/21 0417  TSH 3.418   Anemia Panel: No results for input(s): VITAMINB12, FOLATE, FERRITIN, TIBC, IRON, RETICCTPCT in the last 72 hours. Sepsis Labs: No results for input(s): PROCALCITON, LATICACIDVEN in the last 168 hours.  Recent Results (from the past 240 hour(s))  SARS CORONAVIRUS 2 (TAT 6-24 HRS) Nasopharyngeal Nasopharyngeal Swab     Status: None   Collection Time: 01/17/21 12:39 PM   Specimen:  Nasopharyngeal Swab  Result Value Ref Range Status   SARS Coronavirus 2 NEGATIVE NEGATIVE Final    Comment: (NOTE) SARS-CoV-2 target nucleic acids are NOT DETECTED.  The SARS-CoV-2 RNA is generally detectable in upper and lower respiratory specimens during the acute phase of infection. Negative results do not preclude SARS-CoV-2 infection, do not rule out co-infections with other pathogens, and should not be used as the sole basis for treatment or other patient management decisions. Negative results must be combined with clinical observations, patient history, and epidemiological information. The expected result is Negative.  Fact Sheet for Patients: SugarRoll.be  Fact Sheet for Healthcare Providers: https://www.woods-mathews.com/  This test is not yet approved  or cleared by the [place] and  has been authorized for detection and/or diagnosis of SARS-CoV-2 by FDA under an Emergency Use Authorization (EUA). This EUA will remain  in effect (meaning this test can be used) for the duration of the COVID-19 declaration under Se ction 564(b)(1) of the Act, 21 U.S.C. section 360bbb-3(b)(1), unless the authorization is terminated or revoked sooner.  Performed at [facility], [place] [address], [place], [place] [address]          Radiology Studies: DG Chest Port 1 View  Result Date: 01/17/2021 CLINICAL DATA:  Weakness. EXAM: PORTABLE CHEST 1 VIEW COMPARISON:  11/11/2014 FINDINGS: Heart size and vascularity normal. Mild left lower lobe airspace disease likely atelectasis. Right lung clear. No effusion. IMPRESSION: Mild left lower lobe atelectasis/infiltrate. Electronically Signed   By: Franchot Gallo M.D.   On: 01/17/2021 13:18   ECHOCARDIOGRAM COMPLETE  Result Date: 01/18/2021    ECHOCARDIOGRAM REPORT   Patient Name:   CONNELLY NET[place]LE [facility] Date of Exam: 01/18/2021 Medical Rec #:  [id number]          Height:       70.0 in Accession #:     [id number]         Weight:       185.0 lb Date of Birth:  November 23, 1941          BSA:          2.019 m Patient Age:    41 years           BP:           129/84 mmHg Patient Gender: M                  HR:           51 bpm. Exam Location:  Inpatient Procedure: 2D Echo Indications:    Congestive Heart Failure  History:        Patient has no prior history of Echocardiogram examinations.                 CAD; Risk Factors:Hypertension.  Sonographer:    Mikki Santee RDCS (AE) Referring Phys: [place]  1. Left ventricular ejection fraction, by estimation, is 55 to 60%. The left ventricle has normal function. The left ventricle has no regional wall motion abnormalities. There is mild concentric left ventricular hypertrophy. Left ventricular diastolic parameters are consistent with Grade I diastolic dysfunction (impaired relaxation).  2. Right ventricular systolic function is normal. The right ventricular size is normal.  3. Left atrial size was moderately dilated.  4. Right atrial size was mildly dilated.  5. The mitral valve is grossly normal. Trivial mitral valve regurgitation. No evidence of mitral stenosis. Moderate mitral annular calcification.  6. The aortic valve is tricuspid. There is mild calcification of the aortic valve. Aortic valve regurgitation is mild. No aortic stenosis is present. Comparison(s): No significant change from prior study. Conclusion(s)/Recommendation(s): Otherwise normal echocardiogram, with minor abnormalities described in the report. FINDINGS  Left Ventricle: Left ventricular ejection fraction, by estimation, is 55 to 60%. The left ventricle has normal function. The left ventricle has no regional wall motion abnormalities. The left ventricular internal cavity size was normal in size. There is  mild concentric left ventricular hypertrophy. Left ventricular diastolic parameters are consistent with Grade I diastolic dysfunction (impaired relaxation). Right  Ventricle: The right ventricular size is normal. Right vetricular wall thickness was not well visualized. Right ventricular systolic function  is normal. Left Atrium: Left atrial size was moderately dilated. Right Atrium: Right atrial size was mildly dilated. Pericardium: There is no evidence of pericardial effusion. Mitral Valve: The mitral valve is grossly normal. Moderate mitral annular calcification. Trivial mitral valve regurgitation. No evidence of mitral valve stenosis. Tricuspid Valve: The tricuspid valve is grossly normal. Tricuspid valve regurgitation is trivial. Aortic Valve: The aortic valve is tricuspid. There is mild calcification of the aortic valve. Aortic valve regurgitation is mild. Aortic regurgitation PHT measures 880 msec. No aortic stenosis is present. Pulmonic Valve: The pulmonic valve was grossly normal. Pulmonic valve regurgitation is not visualized. No evidence of pulmonic stenosis. Aorta: The aortic root and ascending aorta are structurally normal, with no evidence of dilitation and the aortic arch was not well visualized. Venous: The inferior vena cava was not well visualized. IAS/Shunts: The atrial septum is grossly normal.  LEFT VENTRICLE PLAX 2D LVIDd:         3.70 cm  Diastology LVIDs:         2.65 cm  LV e' medial:    4.24 cm/s LV PW:         1.30 cm  LV E/e' medial:  10.4 LV IVS:        1.30 cm  LV e' lateral:   6.96 cm/s LVOT diam:     2.30 cm  LV E/e' lateral: 6.3 LV SV:         76 LV SV Index:   37 LVOT Area:     4.15 cm  LEFT ATRIUM             Index       RIGHT ATRIUM           Index LA diam:        4.70 cm 2.33 cm/m  RA Area:     18.00 cm LA Vol (A2C):   56.6 ml 28.03 ml/m RA Volume:   44.50 ml  22.04 ml/m LA Vol (A4C):   84.6 ml 41.90 ml/m LA Biplane Vol: 70.2 ml 34.76 ml/m  AORTIC VALVE LVOT Vmax:   70.80 cm/s LVOT Vmean:  52.400 cm/s LVOT VTI:    0.182 m AI PHT:      880 msec  AORTA Ao Root diam: 3.60 cm MITRAL VALVE MV Area (PHT): 2.07 cm    SHUNTS MV Decel Time:  366 msec    Systemic VTI:  0.18 m MV E velocity: 44.00 cm/s  Systemic Diam: 2.30 cm MV A velocity: 94.00 cm/s MV E/A ratio:  0.47 Buford Dresser MD Electronically signed by Buford Dresser MD Signature Date/Time: 01/18/2021/1:48:28 PM    Final    VAS [place] LOWER EXTREMITY VENOUS (DVT)  Result Date: 01/18/2021  Lower Venous DVT Study Indications: Edema.  Comparison Study: No previous exams Performing Technologist: Rogelia Rohrer  Examination Guidelines: A complete evaluation includes B-mode imaging, spectral Doppler, color Doppler, and power Doppler as needed of all accessible portions of each vessel. Bilateral testing is considered an integral part of a complete examination. Limited examinations for reoccurring indications may be performed as noted. The reflux portion of the exam is performed with the patient in reverse Trendelenburg.  +---------+---------------+---------+-----------+----------+-------------------+ RIGHT    CompressibilityPhasicitySpontaneityPropertiesThrombus Aging      +---------+---------------+---------+-----------+----------+-------------------+ CFV      Full           Yes      Yes                                      +---------+---------------+---------+-----------+----------+-------------------+  SFJ      Full                                                             +---------+---------------+---------+-----------+----------+-------------------+ FV Prox  Full           Yes      Yes                                      +---------+---------------+---------+-----------+----------+-------------------+ FV Mid   Full           Yes      Yes                                      +---------+---------------+---------+-----------+----------+-------------------+ FV DistalFull           Yes      Yes                                      +---------+---------------+---------+-----------+----------+-------------------+ PFV      Full                                                              +---------+---------------+---------+-----------+----------+-------------------+ POP      Full           Yes      Yes                                      +---------+---------------+---------+-----------+----------+-------------------+ PTV      Full                                         Not well visualized +---------+---------------+---------+-----------+----------+-------------------+ PERO     Full                                         Not well visualized +---------+---------------+---------+-----------+----------+-------------------+   +---------+---------------+---------+-----------+----------+--------------+ LEFT     CompressibilityPhasicitySpontaneityPropertiesThrombus Aging +---------+---------------+---------+-----------+----------+--------------+ CFV      Full           Yes      Yes                                 +---------+---------------+---------+-----------+----------+--------------+ SFJ      Full                                                        +---------+---------------+---------+-----------+----------+--------------+  FV Prox  Full           Yes      Yes                                 +---------+---------------+---------+-----------+----------+--------------+ FV Mid   Full           Yes      Yes                                 +---------+---------------+---------+-----------+----------+--------------+ FV DistalFull           Yes      Yes                                 +---------+---------------+---------+-----------+----------+--------------+ PFV      Full                                                        +---------+---------------+---------+-----------+----------+--------------+ POP      Full           Yes      Yes                                 +---------+---------------+---------+-----------+----------+--------------+ PTV      Full                                                         +---------+---------------+---------+-----------+----------+--------------+ PERO     Full                                                        +---------+---------------+---------+-----------+----------+--------------+     Summary: BILATERAL: - No evidence of deep vein thrombosis seen in the lower extremities, bilaterally. - No evidence of superficial venous thrombosis in the lower extremities, bilaterally. -No evidence of popliteal cyst, bilaterally.   *See table(s) above for measurements and observations. Electronically signed by Servando Snare MD on 01/18/2021 at 1:06:35 PM.    Final         Scheduled Meds: . aspirin  81 mg Oral Daily  . enoxaparin (LOVENOX) injection  40 mg Subcutaneous Q24H   Continuous Infusions: . sodium chloride 1,000 mL (01/18/21 0817)          Aline August, MD [facility] 01/19/2021, 7:51 AM

## 2021-01-20 DIAGNOSIS — R001 Bradycardia, unspecified: Secondary | ICD-10-CM | POA: Diagnosis not present

## 2021-01-20 DIAGNOSIS — N179 Acute kidney failure, unspecified: Secondary | ICD-10-CM | POA: Diagnosis not present

## 2021-01-20 DIAGNOSIS — I160 Hypertensive urgency: Secondary | ICD-10-CM | POA: Diagnosis not present

## 2021-01-20 DIAGNOSIS — R55 Syncope and collapse: Secondary | ICD-10-CM | POA: Diagnosis not present

## 2021-01-20 LAB — BASIC METABOLIC PANEL
Anion gap: 8 (ref 5–15)
BUN: 18 mg/dL (ref 8–23)
CO2: 22 mmol/L (ref 22–32)
Calcium: 8.4 mg/dL — ABNORMAL LOW (ref 8.9–10.3)
Chloride: 104 mmol/L (ref 98–111)
Creatinine, Ser: 1.19 mg/dL (ref 0.61–1.24)
GFR, Estimated: >60 mL/min (ref >60)
Glucose, Bld: 103 mg/dL — ABNORMAL HIGH (ref 70–99)
Potassium: 3.6 mmol/L (ref 3.5–5.1)
Sodium: 134 mmol/L — ABNORMAL LOW (ref 135–145)

## 2021-01-20 LAB — CBC WITH DIFFERENTIAL/PLATELET
Abs Immature Granulocytes: 0.02 10*3/uL (ref 0.00–0.07)
Basophils Absolute: 0 10*3/uL (ref 0.0–0.1)
Basophils Relative: 0 %
Eosinophils Absolute: 0.1 10*3/uL (ref 0.0–0.5)
Eosinophils Relative: 2 %
HCT: 39.5 % (ref 39.0–52.0)
Hemoglobin: 14.4 g/dL (ref 13.0–17.0)
Immature Granulocytes: 0 %
Lymphocytes Relative: 13 %
Lymphs Abs: 0.8 10*3/uL (ref 0.7–4.0)
MCH: 34.2 pg — ABNORMAL HIGH (ref 26.0–34.0)
MCHC: 36.5 g/dL — ABNORMAL HIGH (ref 30.0–36.0)
MCV: 93.8 fL (ref 80.0–100.0)
Monocytes Absolute: 0.4 10*3/uL (ref 0.1–1.0)
Monocytes Relative: 7 %
Neutro Abs: 4.7 10*3/uL (ref 1.7–7.7)
Neutrophils Relative %: 78 %
Platelets: 137 10*3/uL — ABNORMAL LOW (ref 150–400)
RBC: 4.21 MIL/uL — ABNORMAL LOW (ref 4.22–5.81)
RDW: 13.3 % (ref 11.5–15.5)
WBC: 6.1 10*3/uL (ref 4.0–10.5)
nRBC: 0 % (ref 0.0–0.2)

## 2021-01-20 LAB — MAGNESIUM: Magnesium: 1.6 mg/dL — ABNORMAL LOW (ref 1.7–2.4)

## 2021-01-20 MED ORDER — MAGNESIUM SULFATE 2 GM/50ML IV SOLN
2.0000 g | Freq: Once | INTRAVENOUS | Status: AC
  Administered 2021-01-20: 2 g via INTRAVENOUS
  Filled 2021-01-20: qty 50

## 2021-01-20 NOTE — Progress Notes (Signed)
Patient ID: Clinton [name], male   DOB: 07-20-1941, 80 y.o.   MRN: [id number]  PROGRESS NOTE    Clinton [name]  FWY:[id number] DOB: 05-07-1941 DOA: 01/17/2021 PCP: [name], NP   Brief Narrative:  80 y.o. male with history of CAD and hypertension and arthritis had a fall at home while walking with his walker, hit his head but did not lose consciousness.  On presentation, CT of the head and cervical spine were unremarkable.  Creatinine was 1.6 from 0.9 in March 2021; BNP was 261.  He was given 1 dose of Lasix in the ED because of lower extremity edema.  He was also found to be bradycardic and kept in observation for near syncope.  Cardiology was consulted.  PT recommended SNF placement.  Assessment & Plan:   Near syncope/fall with unclear cause Bradycardia -Patient presented with fall and possible near syncope, unclear cause and was found to be in sinus bradycardia.  CT of the head and cervical spine were unremarkable  -Heart rate is much improved currently. -Cardiology evaluation appreciated: Metoprolol has been stopped.  If tachycardia does recur, use lower dosing.  Echo showed EF of 55 to 60% with grade 1 diastolic dysfunction.  Cardiology signed off on 01/18/2021 -Fall precautions.   PT/OT recommended SNF.  Social worker consulted  Bilateral lower extremity edema with only mildly elevated BNP -Given a dose of Lasix in the ED.  Will hold off on further Lasix.  Lower extremity Dopplers were negative for DVT.  Echo as above.  No evidence of volume overload at this time  Acute kidney injury -Presented with creatinine of 1.6; creatinine was 0.9 in March was 121.  Creatinine 1.19 this morning. -off IV fluids.  Encourage oral intake  Mild hyponatremia -Monitor.  Encourage oral intake  Hypomagnesemia -Replace  Hypertensive urgency -Blood pressure improving.  Monitor blood pressure.  Metoprolol and lisinopril on hold.  Use hydralazine as needed.  Has been started on  amlodipine.  Thrombocytopenia -Questionable cause.  No signs of bleeding.  Monitor  History of CAD -No chest pain.  On aspirin.  Metoprolol hold   DVT prophylaxis: Lovenox Code Status: Full Family Communication: None Disposition Plan: Status is: Inpatient because: Inpatient level of care appropriate due to severity of illness.   Dispo: The patient is from: Home              Anticipated d/c is to: SNF               Anticipated d/c date is: 1 day              Patient currently is medically stable to d/c.   Difficult to place patient No  Consultants: cardiology  Procedures: Echo as above  Antimicrobials: None   Subjective: Patient seen and examined at bedside.  Poor historian.  Still feels weak.  Denies worsening shortness of breath, chest pain, fever or vomiting. Objective: Vitals:   01/19/21 1256 01/19/21 1648 01/19/21 2021 01/20/21 0446  BP: (!) 160/83 115/69 109/75 (!) 144/76  Pulse: (!) 57 62 79 71  Resp: 17 16 20 20   Temp: 98 F (36.7 C) 98.1 F (36.7 C) 98.2 F (36.8 C) 98 F (36.7 C)  TempSrc: Oral  Oral Oral  SpO2: 100% 100% 100% 95%  Weight: 73.7 kg   74.7 kg  Height: 5\' 10"  (1.778 m)       Intake/Output Summary (Last 24 hours) at 01/20/2021 0720 Last data filed at 01/20/2021 0454 Gross per 24  hour  Intake 800 ml  Output 500 ml  Net 300 ml   Filed Weights   01/18/21 1608 01/19/21 1256 01/20/21 0446  Weight: 83.9 kg 73.7 kg 74.7 kg    Examination:  General exam: Elderly male lying in bed.  Looks chronically ill.  Still on room air.  No acute distress.  Poor historian. Respiratory system: Bilateral decreased breath sounds at bases with scattered crackles Cardiovascular system: S1-S2 heard, rate controlled Gastrointestinal system: Abdomen is nondistended, soft and nontender.  Normal bowel sounds heard  extremities: No cyanosis; trace lower extremity edema present   Data Reviewed: I have personally reviewed following labs and imaging  studies  CBC: Recent Labs  Lab 01/17/21 0856 01/18/21 0421 01/19/21 0316 01/20/21 0436  WBC 9.1 7.3 4.8 6.1  NEUTROABS 7.7  --  3.4 4.7  HGB 15.4 14.8 13.8 14.4  HCT 45.7 44.2 41.8 39.5  MCV 97.4 97.4 98.6 93.8  PLT 145* 134* 122* 937*   Basic Metabolic Panel: Recent Labs  Lab 01/17/21 0856 01/18/21 0421 01/19/21 0316 01/20/21 0436  NA 138 138 134* 134*  K 3.7 4.0 3.6 3.6  CL 103 103 103 104  CO2 21* 22 20* 22  GLUCOSE 93 88 79 103*  BUN 30* 30* 26* 18  CREATININE 1.66* 1.74* 1.35* 1.19  CALCIUM 9.8 9.2 8.5* 8.4*  MG  --   --  1.9 1.6*   GFR: Estimated Creatinine Clearance: 52.8 mL/min (by C-G formula based on SCr of 1.19 mg/dL). Liver Function Tests: Recent Labs  Lab 01/18/21 0421 01/19/21 0316  AST 26 21  ALT 15 13  ALKPHOS 39 36*  BILITOT 1.4* 1.0  PROT 5.8* 5.6*  ALBUMIN 3.3* 3.0*   No results for input(s): LIPASE, AMYLASE in the last 168 hours. No results for input(s): AMMONIA in the last 168 hours. Coagulation Profile: No results for input(s): INR, PROTIME in the last 168 hours. Cardiac Enzymes: No results for input(s): CKTOTAL, CKMB, CKMBINDEX, TROPONINI in the last 168 hours. BNP (last 3 results) Recent Labs    02/17/20 1505  PROBNP 329.0*   HbA1C: No results for input(s): HGBA1C in the last 72 hours. CBG: No results for input(s): GLUCAP in the last 168 hours. Lipid Profile: No results for input(s): CHOL, HDL, LDLCALC, TRIG, CHOLHDL, LDLDIRECT in the last 72 hours. Thyroid Function Tests: Recent Labs    01/18/21 0417  TSH 3.418   Anemia Panel: No results for input(s): VITAMINB12, FOLATE, FERRITIN, TIBC, IRON, RETICCTPCT in the last 72 hours. Sepsis Labs: No results for input(s): PROCALCITON, LATICACIDVEN in the last 168 hours.  Recent Results (from the past 240 hour(s))  SARS CORONAVIRUS 2 (TAT 6-24 HRS) Nasopharyngeal Nasopharyngeal Swab     Status: None   Collection Time: 01/17/21 12:39 PM   Specimen: Nasopharyngeal Swab   Result Value Ref Range Status   SARS Coronavirus 2 NEGATIVE NEGATIVE Final    Comment: (NOTE) SARS-CoV-2 target nucleic acids are NOT DETECTED.  The SARS-CoV-2 RNA is generally detectable in upper and lower respiratory specimens during the acute phase of infection. Negative results do not preclude SARS-CoV-2 infection, do not rule out co-infections with other pathogens, and should not be used as the sole basis for treatment or other patient management decisions. Negative results must be combined with clinical observations, patient history, and epidemiological information. The expected result is Negative.  Fact Sheet for Patients: SugarRoll.be  Fact Sheet for Healthcare Providers: https://www.woods-mathews.com/  This test is not yet approved or cleared by the [place]  States FDA and  has been authorized for detection and/or diagnosis of SARS-CoV-2 by FDA under an Emergency Use Authorization (EUA). This EUA will remain  in effect (meaning this test can be used) for the duration of the COVID-19 declaration under Se ction 564(b)(1) of the Act, 21 U.S.C. section 360bbb-3(b)(1), unless the authorization is terminated or revoked sooner.  Performed at [facility], [place] [address] [place], [place] [address]          Radiology Studies: ECHOCARDIOGRAM COMPLETE  Result Date: 01/18/2021    ECHOCARDIOGRAM REPORT   Patient Name:   Clinton [name] [facility] Date of Exam: 01/18/2021 Medical Rec #:  [id number]          Height:       70.0 in Accession #:    [id number]         Weight:       185.0 lb Date of Birth:  Apr 09, 1941          BSA:          2.019 m Patient Age:    89 years           BP:           129/84 mmHg Patient Gender: M                  HR:           51 bpm. Exam Location:  Inpatient Procedure: 2D Echo Indications:    Congestive Heart Failure  History:        Patient has no prior history of Echocardiogram examinations.                 CAD; Risk  Factors:Hypertension.  Sonographer:    Mikki Santee RDCS (AE) Referring Phys: [place]  1. Left ventricular ejection fraction, by estimation, is 55 to 60%. The left ventricle has normal function. The left ventricle has no regional wall motion abnormalities. There is mild concentric left ventricular hypertrophy. Left ventricular diastolic parameters are consistent with Grade I diastolic dysfunction (impaired relaxation).  2. Right ventricular systolic function is normal. The right ventricular size is normal.  3. Left atrial size was moderately dilated.  4. Right atrial size was mildly dilated.  5. The mitral valve is grossly normal. Trivial mitral valve regurgitation. No evidence of mitral stenosis. Moderate mitral annular calcification.  6. The aortic valve is tricuspid. There is mild calcification of the aortic valve. Aortic valve regurgitation is mild. No aortic stenosis is present. Comparison(s): No significant change from prior study. Conclusion(s)/Recommendation(s): Otherwise normal echocardiogram, with minor abnormalities described in the report. FINDINGS  Left Ventricle: Left ventricular ejection fraction, by estimation, is 55 to 60%. The left ventricle has normal function. The left ventricle has no regional wall motion abnormalities. The left ventricular internal cavity size was normal in size. There is  mild concentric left ventricular hypertrophy. Left ventricular diastolic parameters are consistent with Grade I diastolic dysfunction (impaired relaxation). Right Ventricle: The right ventricular size is normal. Right vetricular wall thickness was not well visualized. Right ventricular systolic function is normal. Left Atrium: Left atrial size was moderately dilated. Right Atrium: Right atrial size was mildly dilated. Pericardium: There is no evidence of pericardial effusion. Mitral Valve: The mitral valve is grossly normal. Moderate mitral annular calcification. Trivial  mitral valve regurgitation. No evidence of mitral valve stenosis. Tricuspid Valve: The tricuspid valve is grossly normal. Tricuspid valve regurgitation is trivial. Aortic Valve: The aortic valve is  tricuspid. There is mild calcification of the aortic valve. Aortic valve regurgitation is mild. Aortic regurgitation PHT measures 880 msec. No aortic stenosis is present. Pulmonic Valve: The pulmonic valve was grossly normal. Pulmonic valve regurgitation is not visualized. No evidence of pulmonic stenosis. Aorta: The aortic root and ascending aorta are structurally normal, with no evidence of dilitation and the aortic arch was not well visualized. Venous: The inferior vena cava was not well visualized. IAS/Shunts: The atrial septum is grossly normal.  LEFT VENTRICLE PLAX 2D LVIDd:         3.70 cm  Diastology LVIDs:         2.65 cm  LV e' medial:    4.24 cm/s LV PW:         1.30 cm  LV E/e' medial:  10.4 LV IVS:        1.30 cm  LV e' lateral:   6.96 cm/s LVOT diam:     2.30 cm  LV E/e' lateral: 6.3 LV SV:         76 LV SV Index:   37 LVOT Area:     4.15 cm  LEFT ATRIUM             Index       RIGHT ATRIUM           Index LA diam:        4.70 cm 2.33 cm/m  RA Area:     18.00 cm LA Vol (A2C):   56.6 ml 28.03 ml/m RA Volume:   44.50 ml  22.04 ml/m LA Vol (A4C):   84.6 ml 41.90 ml/m LA Biplane Vol: 70.2 ml 34.76 ml/m  AORTIC VALVE LVOT Vmax:   70.80 cm/s LVOT Vmean:  52.400 cm/s LVOT VTI:    0.182 m AI PHT:      880 msec  AORTA Ao Root diam: 3.60 cm MITRAL VALVE MV Area (PHT): 2.07 cm    SHUNTS MV Decel Time: 366 msec    Systemic VTI:  0.18 m MV E velocity: 44.00 cm/s  Systemic Diam: 2.30 cm MV A velocity: 94.00 cm/s MV E/A ratio:  0.47 Buford Dresser MD Electronically signed by Buford Dresser MD Signature Date/Time: 01/18/2021/1:48:28 PM    Final    VAS [place] LOWER EXTREMITY VENOUS (DVT)  Result Date: 01/18/2021  Lower Venous DVT Study Indications: Edema.  Comparison Study: No previous exams Performing  Technologist: Rogelia Rohrer  Examination Guidelines: A complete evaluation includes B-mode imaging, spectral Doppler, color Doppler, and power Doppler as needed of all accessible portions of each vessel. Bilateral testing is considered an integral part of a complete examination. Limited examinations for reoccurring indications may be performed as noted. The reflux portion of the exam is performed with the patient in reverse Trendelenburg.  +---------+---------------+---------+-----------+----------+-------------------+ RIGHT    CompressibilityPhasicitySpontaneityPropertiesThrombus Aging      +---------+---------------+---------+-----------+----------+-------------------+ CFV      Full           Yes      Yes                                      +---------+---------------+---------+-----------+----------+-------------------+ SFJ      Full                                                             +---------+---------------+---------+-----------+----------+-------------------+  FV Prox  Full           Yes      Yes                                      +---------+---------------+---------+-----------+----------+-------------------+ FV Mid   Full           Yes      Yes                                      +---------+---------------+---------+-----------+----------+-------------------+ FV DistalFull           Yes      Yes                                      +---------+---------------+---------+-----------+----------+-------------------+ PFV      Full                                                             +---------+---------------+---------+-----------+----------+-------------------+ POP      Full           Yes      Yes                                      +---------+---------------+---------+-----------+----------+-------------------+ PTV      Full                                         Not well visualized  +---------+---------------+---------+-----------+----------+-------------------+ PERO     Full                                         Not well visualized +---------+---------------+---------+-----------+----------+-------------------+   +---------+---------------+---------+-----------+----------+--------------+ LEFT     CompressibilityPhasicitySpontaneityPropertiesThrombus Aging +---------+---------------+---------+-----------+----------+--------------+ CFV      Full           Yes      Yes                                 +---------+---------------+---------+-----------+----------+--------------+ SFJ      Full                                                        +---------+---------------+---------+-----------+----------+--------------+ FV Prox  Full           Yes      Yes                                 +---------+---------------+---------+-----------+----------+--------------+ FV Mid   Full  Yes      Yes                                 +---------+---------------+---------+-----------+----------+--------------+ FV DistalFull           Yes      Yes                                 +---------+---------------+---------+-----------+----------+--------------+ PFV      Full                                                        +---------+---------------+---------+-----------+----------+--------------+ POP      Full           Yes      Yes                                 +---------+---------------+---------+-----------+----------+--------------+ PTV      Full                                                        +---------+---------------+---------+-----------+----------+--------------+ PERO     Full                                                        +---------+---------------+---------+-----------+----------+--------------+     Summary: BILATERAL: - No evidence of deep vein thrombosis seen in the lower extremities, bilaterally. -  No evidence of superficial venous thrombosis in the lower extremities, bilaterally. -No evidence of popliteal cyst, bilaterally.   *See table(s) above for measurements and observations. Electronically signed by Servando Snare MD on 01/18/2021 at 1:06:35 PM.    Final         Scheduled Meds: . amLODipine  10 mg Oral Daily  . aspirin  81 mg Oral Daily  . enoxaparin (LOVENOX) injection  40 mg Subcutaneous Q24H   Continuous Infusions:         Aline August, MD [facility] 01/20/2021, 7:20 AM

## 2021-01-21 DIAGNOSIS — I160 Hypertensive urgency: Secondary | ICD-10-CM | POA: Diagnosis not present

## 2021-01-21 DIAGNOSIS — R55 Syncope and collapse: Secondary | ICD-10-CM | POA: Diagnosis not present

## 2021-01-21 DIAGNOSIS — R001 Bradycardia, unspecified: Secondary | ICD-10-CM | POA: Diagnosis not present

## 2021-01-21 MED ORDER — METOPROLOL TARTRATE 12.5 MG HALF TABLET
12.5000 mg | ORAL_TABLET | Freq: Two times a day (BID) | ORAL | Status: DC
  Administered 2021-01-21 – 2021-01-23 (×5): 12.5 mg via ORAL
  Filled 2021-01-21 (×5): qty 1

## 2021-01-21 NOTE — Progress Notes (Signed)
Patient ID: Clinton [name], male   DOB: 1941/02/07, 80 y.o.   MRN: [id number]  PROGRESS NOTE    Clinton [name]  ASN:[id number] DOB: [birth year] DOA: 01/17/2021 PCP: [name], NP   Brief Narrative:  80 y.o. male with history of CAD and hypertension and arthritis had a fall at home while walking with his walker, hit his head but did not lose consciousness.  On presentation, CT of the head and cervical spine were unremarkable.  Creatinine was 1.6 from 0.9 in March 2021; BNP was 261.  He was given 1 dose of Lasix in the ED because of lower extremity edema.  He was also found to be bradycardic and kept in observation for near syncope.  Cardiology was consulted.  PT recommended SNF placement.  Assessment & Plan:   Near syncope/fall with unclear cause Bradycardia -Patient presented with fall and possible near syncope, unclear cause and was found to be in sinus bradycardia.  CT of the head and cervical spine were unremarkable  -Heart rate is much improved currently and sometimes tachycardic as well. -Cardiology evaluation appreciated: Metoprolol has been stopped.  If tachycardia does recur, use lower dosing.  Echo showed EF of 55 to 60% with grade 1 diastolic dysfunction.  Cardiology signed off on 01/18/2021 -Fall precautions.   PT/OT recommended SNF.  Social worker consulted -will resume metoprolol 12.5 mg twice daily  Bilateral lower extremity edema with only mildly elevated BNP -Given a dose of Lasix in the ED.  Will hold off on further Lasix.  Lower extremity Dopplers were negative for DVT.  Echo as above.  No evidence of volume overload at this time  Acute kidney injury -Presented with creatinine of 1.6; creatinine was 0.9 in March was 121.  Creatinine 1.19 on 01/20/2021 -off IV fluids.  Encourage oral intake  Mild hyponatremia -Encourage oral intake  Hypomagnesemia -No labs today  Hypertensive urgency -Blood pressure improving.  Monitor blood pressure.  Metoprolol and  lisinopril on hold.  Use hydralazine as needed.  Has been started on amlodipine.  Metoprolol plan as above.  Thrombocytopenia -Questionable cause.  No signs of bleeding.    History of CAD -No chest pain.  On aspirin.     DVT prophylaxis: Lovenox Code Status: Full Family Communication: None Disposition Plan: Status is: Inpatient because: Inpatient level of care appropriate due to severity of illness.   Dispo: The patient is from: Home              Anticipated d/c is to: SNF               Anticipated d/c date is: 1 day              Patient currently is medically stable to d/c.   Difficult to place patient No  Consultants: cardiology  Procedures: Echo as above  Antimicrobials: None   Subjective: Patient seen and examined at bedside.  No overnight chest pain, shortness of breath, fever or vomiting reported.  Poor historian. Objective: Vitals:   01/20/21 0446 01/20/21 1222 01/20/21 1935 01/21/21 0312  BP: (!) 144/76 135/75 123/78 (!) 159/102  Pulse: 71 90 76 (!) 110  Resp: 20 (!) 21 18 16   Temp: 98 F (36.7 C) 98 F (36.7 C) 98.2 F (36.8 C) 97.8 F (36.6 C)  TempSrc: Oral Oral Oral Oral  SpO2: 95% 98% 99% 99%  Weight: 74.7 kg   74.1 kg  Height:        Intake/Output Summary (Last 24 hours) at  01/21/2021 0736 Last data filed at 01/20/2021 2150 Gross per 24 hour  Intake 720 ml  Output 150 ml  Net 570 ml   Filed Weights   01/19/21 1256 01/20/21 0446 01/21/21 0312  Weight: 73.7 kg 74.7 kg 74.1 kg    Examination:  General exam: No distress.  Looks chronically ill.  Still on room air.  He is a poor historian.  Respiratory system: Decreased breath sounds at bases bilaterally, no wheezing Cardiovascular system: Intermittently tachycardic, S1-S2 heard Gastrointestinal system: Abdomen is nondistended, soft and nontender.  Bowel sounds are heard  extremities: Bilateral lower extremity mild edema present; no clubbing  Data Reviewed: I have personally reviewed following  labs and imaging studies  CBC: Recent Labs  Lab 01/17/21 0856 01/18/21 0421 01/19/21 0316 01/20/21 0436  WBC 9.1 7.3 4.8 6.1  NEUTROABS 7.7  --  3.4 4.7  HGB 15.4 14.8 13.8 14.4  HCT 45.7 44.2 41.8 39.5  MCV 97.4 97.4 98.6 93.8  PLT 145* 134* 122* 782*   Basic Metabolic Panel: Recent Labs  Lab 01/17/21 0856 01/18/21 0421 01/19/21 0316 01/20/21 0436  NA 138 138 134* 134*  K 3.7 4.0 3.6 3.6  CL 103 103 103 104  CO2 21* 22 20* 22  GLUCOSE 93 88 79 103*  BUN 30* 30* 26* 18  CREATININE 1.66* 1.74* 1.35* 1.19  CALCIUM 9.8 9.2 8.5* 8.4*  MG  --   --  1.9 1.6*   GFR: Estimated Creatinine Clearance: 52.8 mL/min (by C-G formula based on SCr of 1.19 mg/dL). Liver Function Tests: Recent Labs  Lab 01/18/21 0421 01/19/21 0316  AST 26 21  ALT 15 13  ALKPHOS 39 36*  BILITOT 1.4* 1.0  PROT 5.8* 5.6*  ALBUMIN 3.3* 3.0*   No results for input(s): LIPASE, AMYLASE in the last 168 hours. No results for input(s): AMMONIA in the last 168 hours. Coagulation Profile: No results for input(s): INR, PROTIME in the last 168 hours. Cardiac Enzymes: No results for input(s): CKTOTAL, CKMB, CKMBINDEX, TROPONINI in the last 168 hours. BNP (last 3 results) Recent Labs    02/17/20 1505  PROBNP 329.0*   HbA1C: No results for input(s): HGBA1C in the last 72 hours. CBG: No results for input(s): GLUCAP in the last 168 hours. Lipid Profile: No results for input(s): CHOL, HDL, LDLCALC, TRIG, CHOLHDL, LDLDIRECT in the last 72 hours. Thyroid Function Tests: No results for input(s): TSH, T4TOTAL, FREET4, T3FREE, THYROIDAB in the last 72 hours. Anemia Panel: No results for input(s): VITAMINB12, FOLATE, FERRITIN, TIBC, IRON, RETICCTPCT in the last 72 hours. Sepsis Labs: No results for input(s): PROCALCITON, LATICACIDVEN in the last 168 hours.  Recent Results (from the past 240 hour(s))  SARS CORONAVIRUS 2 (TAT 6-24 HRS) Nasopharyngeal Nasopharyngeal Swab     Status: None   Collection Time:  01/17/21 12:39 PM   Specimen: Nasopharyngeal Swab  Result Value Ref Range Status   SARS Coronavirus 2 NEGATIVE NEGATIVE Final    Comment: (NOTE) SARS-CoV-2 target nucleic acids are NOT DETECTED.  The SARS-CoV-2 RNA is generally detectable in upper and lower respiratory specimens during the acute phase of infection. Negative results do not preclude SARS-CoV-2 infection, do not rule out co-infections with other pathogens, and should not be used as the sole basis for treatment or other patient management decisions. Negative results must be combined with clinical observations, patient history, and epidemiological information. The expected result is Negative.  Fact Sheet for Patients: SugarRoll.be  Fact Sheet for Healthcare Providers: https://www.woods-mathews.com/  This test  is not yet approved or cleared by the [place] and  has been authorized for detection and/or diagnosis of SARS-CoV-2 by FDA under an Emergency Use Authorization (EUA). This EUA will remain  in effect (meaning this test can be used) for the duration of the COVID-19 declaration under Se ction 564(b)(1) of the Act, 21 U.S.C. section 360bbb-3(b)(1), unless the authorization is terminated or revoked sooner.  Performed at [facility], [place] [address], [place], [place] [address]          Radiology Studies: No results found.      Scheduled Meds: . amLODipine  10 mg Oral Daily  . aspirin  81 mg Oral Daily  . enoxaparin (LOVENOX) injection  40 mg Subcutaneous Q24H   Continuous Infusions:         Aline August, MD [facility] 01/21/2021, 7:36 AM

## 2021-01-21 NOTE — TOC Progression Note (Addendum)
Transition of Care [facility]) - Progression Note    Patient Details  Name: Clinton [name] MRN: [id number] Date of Birth: 08-05-1941  Transition of Care [facility]) CM/SW [place], [place] Phone Number: 01/21/2021, 2:41 PM  Clinical Narrative:    CSW met with pt at bedside.  Pt's son Clinton [name] had requested that CSW contact him for SNF acceptance.  CSW spoke with pt at bedside of SNF accept facility.  Pt's son Clinton [name] is pt's POA. Left vm for pt's son Clinton [name].  CSW left medicare.gov list and a list of accepted SNF on yellow sticky note for pt and pt's son.  TOC team will continue to assist with disposition planning.  4:05: Pt's son Clinton [name] has selected [facility] for SNF.  CSW asked to contact pt for SNF choice.  Pt has stated that he will make final decision concerning possible SNF placement.  Son has verified with pt for SNF choice.  Pt's son Clinton [name] would like for pt to begin medicaid application with financial counseling on Monday befor discharging.   Expected Discharge Plan: [place] Barriers to Discharge: Continued Medical Work up  Expected Discharge Plan and Services Expected Discharge Plan: [place] arrangements for the past 2 months: Apartment                                       Social Determinants of Health (SDOH) Interventions    Readmission Risk Interventions No flowsheet data found.

## 2021-01-22 DIAGNOSIS — R55 Syncope and collapse: Secondary | ICD-10-CM | POA: Diagnosis not present

## 2021-01-22 DIAGNOSIS — I160 Hypertensive urgency: Secondary | ICD-10-CM | POA: Diagnosis not present

## 2021-01-22 DIAGNOSIS — R001 Bradycardia, unspecified: Secondary | ICD-10-CM | POA: Diagnosis not present

## 2021-01-22 LAB — SARS CORONAVIRUS 2 (TAT 6-24 HRS): SARS Coronavirus 2: NEGATIVE

## 2021-01-22 NOTE — Progress Notes (Signed)
Patient ID: Clinton [name], male   DOB: Mar 07, 1941, 80 y.o.   MRN: [id number]  PROGRESS NOTE    Clinton [name]  ELF:[id number] DOB: [birth year] DOA: 01/17/2021 PCP: [name], NP   Brief Narrative:  80 y.o. male with history of CAD and hypertension and arthritis had a fall at home while walking with his walker, hit his head but did not lose consciousness.  On presentation, CT of the head and cervical spine were unremarkable.  Creatinine was 1.6 from 0.9 in March 2021; BNP was 261.  He was given 1 dose of Lasix in the ED because of lower extremity edema.  He was also found to be bradycardic and kept in observation for near syncope.  Cardiology was consulted.  PT recommended SNF placement.  Assessment & Plan:   Near syncope/fall with unclear cause Bradycardia -Patient presented with fall and possible near syncope, unclear cause and was found to be in sinus bradycardia.  CT of the head and cervical spine were unremarkable  -Heart rate is much improved currently and sometimes tachycardic as well. -Cardiology evaluation appreciated: Metoprolol has been stopped.  If tachycardia does recur, use lower dosing.  Echo showed EF of 55 to 60% with grade 1 diastolic dysfunction.  Cardiology signed off on 01/18/2021 -Fall precautions.   PT/OT recommended SNF.  Social worker consulted - continue metoprolol 12.5 mg twice daily  Bilateral lower extremity edema with only mildly elevated BNP -Given a dose of Lasix in the ED.  Will hold off on further Lasix.  Lower extremity Dopplers were negative for DVT.  Echo as above.  No evidence of volume overload at this time  Acute kidney injury -Presented with creatinine of 1.6; creatinine was 0.9 in March was 121.  Creatinine 1.19 on 01/20/2021 -off IV fluids.  Encourage oral intake  Mild hyponatremia -Encourage oral intake  Hypomagnesemia -No labs today  Hypertensive urgency -Blood pressure improving.  Monitor blood pressure.  Metoprolol and lisinopril  on hold.  Use hydralazine as needed.  Has been started on amlodipine.  Metoprolol plan as above.  Thrombocytopenia -Questionable cause.  No signs of bleeding.    History of CAD -No chest pain.  On aspirin.     DVT prophylaxis: Lovenox Code Status: Full Family Communication: None Disposition Plan: Status is: Inpatient because: Inpatient level of care appropriate due to severity of illness.   Dispo: The patient is from: Home              Anticipated d/c is to: SNF               Anticipated d/c date is: 1 day              Patient currently is medically stable to d/c.   Difficult to place patient No  Consultants: cardiology  Procedures: Echo as above  Antimicrobials: None   Subjective: Patient seen and examined at bedside.  Very poor historian.  Denies worsening shortness of breath or chest pain.  Does not participate in conversation much.   Objective: Vitals:   01/21/21 1119 01/21/21 1927 01/22/21 0447 01/22/21 0647  BP: (!) 151/92 (!) 161/97  139/77  Pulse: 92 99  88  Resp: 16 20  17   Temp: 98.4 F (36.9 C) 98.4 F (36.9 C)    TempSrc: Oral Oral    SpO2: 99% 100%  97%  Weight:   72.9 kg   Height:        Intake/Output Summary (Last 24 hours) at 01/22/2021 0913  Last data filed at 01/21/2021 2300 Gross per 24 hour  Intake 360 ml  Output 200 ml  Net 160 ml   Filed Weights   01/20/21 0446 01/21/21 0312 01/22/21 0447  Weight: 74.7 kg 74.1 kg 72.9 kg    Examination:  General exam: No acute distress.  Looks chronically ill.  On room air. Poor historian Respiratory system: bilateral decreased breath sounds at bases, no wheezing cardiovascular system: S1-S2 heard, rate controlled Gastrointestinal system: Abdomen is nondistended, soft and nontender.  Normal bowel sounds are heard  extremities: No cyanosis; bilateral lower extremity edema present  Data Reviewed: I have personally reviewed following labs and imaging studies  CBC: Recent Labs  Lab 01/17/21 0856  01/18/21 0421 01/19/21 0316 01/20/21 0436  WBC 9.1 7.3 4.8 6.1  NEUTROABS 7.7  --  3.4 4.7  HGB 15.4 14.8 13.8 14.4  HCT 45.7 44.2 41.8 39.5  MCV 97.4 97.4 98.6 93.8  PLT 145* 134* 122* 182*   Basic Metabolic Panel: Recent Labs  Lab 01/17/21 0856 01/18/21 0421 01/19/21 0316 01/20/21 0436  NA 138 138 134* 134*  K 3.7 4.0 3.6 3.6  CL 103 103 103 104  CO2 21* 22 20* 22  GLUCOSE 93 88 79 103*  BUN 30* 30* 26* 18  CREATININE 1.66* 1.74* 1.35* 1.19  CALCIUM 9.8 9.2 8.5* 8.4*  MG  --   --  1.9 1.6*   GFR: Estimated Creatinine Clearance: 52.8 mL/min (by C-G formula based on SCr of 1.19 mg/dL). Liver Function Tests: Recent Labs  Lab 01/18/21 0421 01/19/21 0316  AST 26 21  ALT 15 13  ALKPHOS 39 36*  BILITOT 1.4* 1.0  PROT 5.8* 5.6*  ALBUMIN 3.3* 3.0*   No results for input(s): LIPASE, AMYLASE in the last 168 hours. No results for input(s): AMMONIA in the last 168 hours. Coagulation Profile: No results for input(s): INR, PROTIME in the last 168 hours. Cardiac Enzymes: No results for input(s): CKTOTAL, CKMB, CKMBINDEX, TROPONINI in the last 168 hours. BNP (last 3 results) Recent Labs    02/17/20 1505  PROBNP 329.0*   HbA1C: No results for input(s): HGBA1C in the last 72 hours. CBG: No results for input(s): GLUCAP in the last 168 hours. Lipid Profile: No results for input(s): CHOL, HDL, LDLCALC, TRIG, CHOLHDL, LDLDIRECT in the last 72 hours. Thyroid Function Tests: No results for input(s): TSH, T4TOTAL, FREET4, T3FREE, THYROIDAB in the last 72 hours. Anemia Panel: No results for input(s): VITAMINB12, FOLATE, FERRITIN, TIBC, IRON, RETICCTPCT in the last 72 hours. Sepsis Labs: No results for input(s): PROCALCITON, LATICACIDVEN in the last 168 hours.  Recent Results (from the past 240 hour(s))  SARS CORONAVIRUS 2 (TAT 6-24 HRS) Nasopharyngeal Nasopharyngeal Swab     Status: None   Collection Time: 01/17/21 12:39 PM   Specimen: Nasopharyngeal Swab  Result Value  Ref Range Status   SARS Coronavirus 2 NEGATIVE NEGATIVE Final    Comment: (NOTE) SARS-CoV-2 target nucleic acids are NOT DETECTED.  The SARS-CoV-2 RNA is generally detectable in upper and lower respiratory specimens during the acute phase of infection. Negative results do not preclude SARS-CoV-2 infection, do not rule out co-infections with other pathogens, and should not be used as the sole basis for treatment or other patient management decisions. Negative results must be combined with clinical observations, patient history, and epidemiological information. The expected result is Negative.  Fact Sheet for Patients: SugarRoll.be  Fact Sheet for Healthcare Providers: https://www.woods-mathews.com/  This test is not yet approved or cleared by  the [employer] and  has been authorized for detection and/or diagnosis of SARS-CoV-2 by FDA under an Emergency Use Authorization (EUA). This EUA will remain  in effect (meaning this test can be used) for the duration of the COVID-19 declaration under Se ction 564(b)(1) of the Act, 21 U.S.C. section 360bbb-3(b)(1), unless the authorization is terminated or revoked sooner.  Performed at [facility], [place] [address], [place], [place] [address]          Radiology Studies: No results found.      Scheduled Meds: . amLODipine  10 mg Oral Daily  . aspirin  81 mg Oral Daily  . enoxaparin (LOVENOX) injection  40 mg Subcutaneous Q24H  . metoprolol tartrate  12.5 mg Oral BID   Continuous Infusions:         Aline August, MD [facility] 01/22/2021, 9:13 AM

## 2021-01-22 NOTE — TOC Progression Note (Signed)
Transition of Care [facility]) - Progression Note    Patient Details  Name: Clinton [name] MRN: [id number] Date of Birth: Nov 25, 1941  Transition of Care [facility]) CM/SW Contact  Zenon Mayo, RN Phone Number: 01/22/2021, 4:24 PM  Clinical Narrative:    NCM started Josem Kaufmann with [facility], she gave this NCM a pending auth number of [id number] for [facility].   Expected Discharge Plan: [place] Barriers to Discharge: Continued Medical Work up  Expected Discharge Plan and Services Expected Discharge Plan: [place] arrangements for the past 2 months: Apartment                                       Social Determinants of Health (SDOH) Interventions    Readmission Risk Interventions No flowsheet data found.

## 2021-01-22 NOTE — Progress Notes (Signed)
Physical Therapy Treatment Patient Details Name: Clinton [name] MRN: [id number] DOB: [birth year] Today's Date: 01/22/2021    History of Present Illness Clinton [name] is a 80 y.o. male with history of CAD, HTN and arthritis had a fall backwards at home while walking with RW.  Per report patient has been having falls recently. Has been having some lower extremity edema. CT head and cervical spine were unremarkable.  Labs show worsening renal function, and BP is elevated.    PT Comments    Patient received in bed sleeping. Rouses to my voice. Patient is agreeable to PT session. Reports he has been wanting to sit up on the side of the bed all morning.  Patient is quite talkative and tangential during session. Requires increased time for mobility and initiation. Requires max assist for bed mobility and attempt to perform sit to stand. Patient unable to get standing despite elevated bed and max assist. He has difficulty using UEs to assist due to OA and lack of motion in B shoulders. Patient is very weak and will continue to benefit from skilled PT while here to improve functional independence and strength.     Follow Up Recommendations  SNF;Supervision/Assistance - 24 hour     Equipment Recommendations  Other (comment) (TBD)    Recommendations for Other Services       Precautions / Restrictions Precautions Precautions: Fall Precaution Comments: Fall prior to admission Restrictions Weight Bearing Restrictions: No    Mobility  Bed Mobility Overal bed mobility: Needs Assistance Bed Mobility: Supine to Sit;Sit to Supine;Sidelying to Sit;Sit to Sidelying;Rolling Rolling: Max assist Sidelying to sit: Max assist Supine to sit: Max assist;HOB elevated Sit to supine: Max assist Sit to sidelying: Max assist General bed mobility comments: very slow to move, requires assist to bring UEs to rail to assist. Assist to bring LEs on/off bed and to raise trunk to seated position with HOB all  the way up.    Transfers Overall transfer level: Needs assistance Equipment used: Rolling walker (2 wheeled) Transfers: Sit to/from Stand Sit to Stand: Total assist;From elevated surface         General transfer comment: Patient unable to get fully standing with max +1 assist from elevated surface. He may have been able to clear bottom but unsure. Requires increased time, re-direction, cues for mobility  Ambulation/Gait             General Gait Details: unable   Stairs             Wheelchair Mobility    Modified Rankin (Stroke Patients Only)       Balance Overall balance assessment: Needs assistance;History of Falls Sitting-balance support: Feet supported;Bilateral upper extremity supported Sitting balance-Leahy Scale: Poor Sitting balance - Comments: able to go from max assist to supervision sitting edge of bed. Postural control: Posterior lean;Right lateral lean Standing balance support: During functional activity;Bilateral upper extremity supported Standing balance-Leahy Scale: Zero                              Cognition Arousal/Alertness: Awake/alert Behavior During Therapy: WFL for tasks assessed/performed Overall Cognitive Status: No family/caregiver present to determine baseline cognitive functioning Area of Impairment: Following commands;Safety/judgement;Awareness                   Current Attention Level: Sustained   Following Commands: Follows one step commands with increased time Safety/Judgement: Decreased awareness of safety;Decreased awareness of deficits Awareness:  Emergent   General Comments: Very tangential, difficult to maintain on task. Requires cues.      Exercises Total Joint Exercises Ankle Circles/Pumps: AROM;10 reps;Both    General Comments        Pertinent Vitals/Pain Pain Assessment: Faces Faces Pain Scale: Hurts little more Pain Location: generalized "I have arthritis everywhere" Pain  Descriptors / Indicators: Discomfort;Constant Pain Intervention(s): Monitored during session;Repositioned    Home Living                      Prior Function            PT Goals (current goals can now be found in the care plan section) Acute Rehab PT Goals Patient Stated Goal: to go to rehab PT Goal Formulation: With patient Time For Goal Achievement: 02/01/21 Potential to Achieve Goals: Good Progress towards PT goals: Not progressing toward goals - comment (patient very weak and dependent)    Frequency    Min 2X/week      PT Plan Current plan remains appropriate    Co-evaluation              AM-PAC PT "6 Clicks" Mobility   Outcome Measure  Help needed turning from your back to your side while in a flat bed without using bedrails?: A Lot Help needed moving from lying on your back to sitting on the side of a flat bed without using bedrails?: A Lot Help needed moving to and from a bed to a chair (including a wheelchair)?: Total Help needed standing up from a chair using your arms (e.g., wheelchair or bedside chair)?: Total Help needed to walk in hospital room?: Total Help needed climbing 3-5 steps with a railing? : Total 6 Click Score: 8    End of Session Equipment Utilized During Treatment: Gait belt Activity Tolerance: Patient limited by lethargy;Patient limited by fatigue Patient left: in bed;with call bell/phone within reach;with bed alarm set Nurse Communication: Mobility status PT Visit Diagnosis: Muscle weakness (generalized) (M62.81);History of falling (Z91.81);Repeated falls (R29.6);Unsteadiness on feet (R26.81);Other abnormalities of gait and mobility (R26.89);Pain Pain - part of body:  (all over due to OA)     Time: 3734-2876 PT Time Calculation (min) (ACUTE ONLY): 29 min  Charges:  $Therapeutic Activity: 23-37 mins                     [employer], PT, [facility] 01/22/21,2:01 PM

## 2021-01-23 DIAGNOSIS — R600 Localized edema: Secondary | ICD-10-CM | POA: Diagnosis not present

## 2021-01-23 DIAGNOSIS — R262 Difficulty in walking, not elsewhere classified: Secondary | ICD-10-CM | POA: Diagnosis not present

## 2021-01-23 DIAGNOSIS — I5022 Chronic systolic (congestive) heart failure: Secondary | ICD-10-CM | POA: Diagnosis not present

## 2021-01-23 DIAGNOSIS — Z7401 Bed confinement status: Secondary | ICD-10-CM | POA: Diagnosis not present

## 2021-01-23 DIAGNOSIS — I160 Hypertensive urgency: Secondary | ICD-10-CM | POA: Diagnosis not present

## 2021-01-23 DIAGNOSIS — I4891 Unspecified atrial fibrillation: Secondary | ICD-10-CM | POA: Diagnosis not present

## 2021-01-23 DIAGNOSIS — M2550 Pain in unspecified joint: Secondary | ICD-10-CM | POA: Diagnosis not present

## 2021-01-23 DIAGNOSIS — M6281 Muscle weakness (generalized): Secondary | ICD-10-CM | POA: Diagnosis not present

## 2021-01-23 DIAGNOSIS — I429 Cardiomyopathy, unspecified: Secondary | ICD-10-CM | POA: Diagnosis not present

## 2021-01-23 DIAGNOSIS — Z9181 History of falling: Secondary | ICD-10-CM | POA: Diagnosis not present

## 2021-01-23 DIAGNOSIS — I10 Essential (primary) hypertension: Secondary | ICD-10-CM | POA: Diagnosis not present

## 2021-01-23 DIAGNOSIS — R1311 Dysphagia, oral phase: Secondary | ICD-10-CM | POA: Diagnosis not present

## 2021-01-23 DIAGNOSIS — R001 Bradycardia, unspecified: Secondary | ICD-10-CM | POA: Diagnosis not present

## 2021-01-23 DIAGNOSIS — N183 Chronic kidney disease, stage 3 unspecified: Secondary | ICD-10-CM | POA: Diagnosis not present

## 2021-01-23 DIAGNOSIS — R55 Syncope and collapse: Secondary | ICD-10-CM | POA: Diagnosis not present

## 2021-01-23 DIAGNOSIS — R002 Palpitations: Secondary | ICD-10-CM | POA: Diagnosis not present

## 2021-01-23 DIAGNOSIS — R2681 Unsteadiness on feet: Secondary | ICD-10-CM | POA: Diagnosis not present

## 2021-01-23 DIAGNOSIS — N179 Acute kidney failure, unspecified: Secondary | ICD-10-CM | POA: Diagnosis not present

## 2021-01-23 DIAGNOSIS — E871 Hypo-osmolality and hyponatremia: Secondary | ICD-10-CM | POA: Diagnosis not present

## 2021-01-23 MED ORDER — AMLODIPINE BESYLATE 10 MG PO TABS: 10.0000 mg | ORAL_TABLET | Freq: Every day | ORAL | 0 refills | Status: DC

## 2021-01-23 MED ORDER — METOPROLOL TARTRATE 25 MG PO TABS: 12.5000 mg | ORAL_TABLET | Freq: Two times a day (BID) | ORAL | 0 refills | Status: DC

## 2021-01-23 NOTE — Discharge Summary (Signed)
Physician Discharge Summary  Bernhardt Riemenschneider SJG:[id number] DOB: 1941-01-23 DOA: 01/17/2021  PCP: [name], NP  Admit date: 01/17/2021 Discharge date: 01/23/2021  Admitted From: Home Disposition: SNF  Recommendations for Outpatient Follow-up:  1. Follow up with SNF provider at earliest convenience with repeat BMP in the next week 2. Outpatient follow-up with cardiology 3. Follow up in ED if symptoms worsen or new appear   Home Health: No Equipment/Devices: None  Discharge Condition: Stable CODE STATUS: Full Diet recommendation: Heart healthy  Brief/Interim Summary: 80 y.o.malewithhistory of CAD and hypertension and arthritis had a fall at home while walking with his walker, hit his head but did not lose consciousness.  On presentation, CT of the head and cervical spine were unremarkable.  Creatinine was 1.6 from 0.9 in March 2021; BNP was 261.  He was given 1 dose of Lasix in the ED because of lower extremity edema.  He was also found to be bradycardic and kept in observation for near syncope.  Cardiology was consulted.  During hospitalization, his heart rate has much improved.  Cardiology recommended resume metoprolol at a lower dose if needed.  Metoprolol has been resumed at a lower dose. PT recommended SNF placement.  He will be discharged to SNF once bed is available.    Discharge Diagnoses:    Near syncope/fall with unclear cause Bradycardia -Patient presented with fall and possible near syncope, unclear cause and was found to be in sinus bradycardia.  CT of the head and cervical spine were unremarkable  -Heart rate is much improved currently -Cardiology evaluation appreciated:  If tachycardia does recur, use lower dosing of metoprolol.  Echo showed EF of 55 to 60% with grade 1 diastolic dysfunction.  Cardiology signed off on 01/18/2021 -Fall precautions.   PT/OT recommended SNF.  Social worker consulted - Metoprolol has been resumed at a lower dose of 12.5 mg twice a  day and this dose can be titrated upwards regarding the heart rate.  Outpatient follow-up with cardiology.  Bilateral lower extremity edema with only mildly elevated BNP -Given a dose of Lasix in the ED.  Will hold off on further Lasix.  Lower extremity Dopplers were negative for DVT.  Echo as above.  No evidence of volume overload at this time  Acute kidney injury -Presented with creatinine of 1.6; creatinine was 0.9 in March was 121.  Creatinine 1.19 on 01/20/2021 -off IV fluids.  Encourage oral intake  Mild hyponatremia -Encourage oral intake.  No labs today  Hypomagnesemia -Replaced during the hospitalization.  No labs today  Hypertensive urgency -Much improved.  Continue amlodipine which was started in this hospitalization along with lower dose of metoprolol.  Lisinopril will remain on hold.  Thrombocytopenia -Questionable cause.  No signs of bleeding.    History of CAD -No chest pain.  On aspirin.      Discharge Instructions  Discharge Instructions    Ambulatory referral to Cardiology   Complete by: As directed    Hospital follow-up for bradycardia   Diet - low sodium heart healthy   Complete by: As directed    Increase activity slowly   Complete by: As directed      Allergies as of 01/23/2021   No Known Allergies     Medication List    STOP taking these medications   lisinopril 20 MG tablet Commonly known as: ZESTRIL     TAKE these medications   amLODipine 10 MG tablet Commonly known as: NORVASC Take 1 tablet (10 mg total) by  mouth daily.   aspirin 81 MG chewable tablet Chew 81 mg by mouth daily.   calcium elemental as carbonate 400 MG chewable tablet Commonly known as: BARIATRIC TUMS ULTRA Chew 1,000 mg by mouth 3 (three) times daily.   metoprolol tartrate 25 MG tablet Commonly known as: LOPRESSOR Take 0.5 tablets (12.5 mg total) by mouth 2 (two) times daily. What changed:   medication strength  See the new instructions.       Contact  information for after-discharge care    Destination    [facility] Preferred SNF .   Service: Skilled Nursing Contact information: [address] [place] [phone]                 No Known Allergies  Consultations:  Cardiology   Procedures/Studies: CT Head Wo Contrast  Result Date: 01/17/2021 CLINICAL DATA:  80 year old male status post fall at home. Facial trauma. EXAM: CT HEAD WITHOUT CONTRAST TECHNIQUE: Contiguous axial images were obtained from the base of the skull through the vertex without intravenous contrast. COMPARISON:  Head CT 04/16/2020. FINDINGS: Brain: Stable cerebral volume from last year. No midline shift, ventriculomegaly, mass effect, evidence of mass lesion, intracranial hemorrhage or evidence of cortically based acute infarction. Stable gray-white matter differentiation throughout the brain. Patchy left greater than right hemisphere white matter hypodensity, with chronic involvement of the left basal ganglia. Moderate heterogeneity in the pons appears stable. And better demonstrated on the sagittal image a chronic discrete pontine lacunar infarct is stable. No cortical encephalomalacia identified. Vascular: Extensive Calcified atherosclerosis at the skull base. No suspicious intracranial vascular hyperdensity. Skull: No acute osseous abnormality identified. Sinuses/Orbits: Visualized paranasal sinuses and mastoids are stable and well pneumatized. Other: No acute orbit or scalp soft tissue injury identified. Negative visible face soft tissues. IMPRESSION: 1. No acute traumatic injury identified. 2. Chronic small vessel disease appears stable by CT from last year. Electronically Signed   By: Genevie Ann M.D.   On: 01/17/2021 05:17   CT Cervical Spine Wo Contrast  Result Date: 01/17/2021 CLINICAL DATA:  80 year old male status post fall at home. Facial trauma. EXAM: CT CERVICAL SPINE WITHOUT CONTRAST TECHNIQUE:  Multidetector CT imaging of the cervical spine was performed without intravenous contrast. Multiplanar CT image reconstructions were also generated. COMPARISON:  Head CT today reported separately. Cervical spine CT 04/16/2020. FINDINGS: Alignment: Straightening of cervical lordosis today, less reversal of lordosis compared to last year. Cervicothoracic junction alignment is within normal limits. Bilateral posterior element alignment is within normal limits. Skull base and vertebrae: Visualized skull base is intact. No atlanto-occipital dissociation. C1 and C2 remain normally aligned. C2 vertebra appears stable and intact, with prominent nutrient foramina noted at the junction of the body and pedicles (on the left series 9, image 19). No acute osseous abnormality identified. Soft tissues and spinal canal: No prevertebral fluid or swelling. No visible canal hematoma. Retropharyngeal course of the carotids in the neck with calcified atherosclerosis. Disc levels: Stable chronic cervical disc and endplate degeneration, prominent right upper cervical spine facet arthropathy. Upper chest: Visible upper thoracic levels appear grossly intact. Negative lung apices. Calcified aortic atherosclerosis. Other: Head CT today reported separately. IMPRESSION: 1. No acute traumatic injury identified in the cervical spine. 2. Stable chronic cervical spine degeneration. 3. Aortic Atherosclerosis (ICD10-I70.0). Electronically Signed   By: Genevie Ann M.D.   On: 01/17/2021 05:23   DG Chest Port 1 View  Result Date: 01/17/2021 CLINICAL DATA:  Weakness. EXAM: PORTABLE CHEST  1 VIEW COMPARISON:  11/11/2014 FINDINGS: Heart size and vascularity normal. Mild left lower lobe airspace disease likely atelectasis. Right lung clear. No effusion. IMPRESSION: Mild left lower lobe atelectasis/infiltrate. Electronically Signed   By: Franchot Gallo M.D.   On: 01/17/2021 13:18   ECHOCARDIOGRAM COMPLETE  Result Date: 01/18/2021    ECHOCARDIOGRAM REPORT    Patient Name:   ADMIR CANDELAS [facility] Date of Exam: 01/18/2021 Medical Rec #:  [id number]          Height:       70.0 in Accession #:    [id number]         Weight:       185.0 lb Date of Birth:  30-Nov-1941          BSA:          2.019 m Patient Age:    62 years           BP:           129/84 mmHg Patient Gender: M                  HR:           51 bpm. Exam Location:  Inpatient Procedure: 2D Echo Indications:    Congestive Heart Failure  History:        Patient has no prior history of Echocardiogram examinations.                 CAD; Risk Factors:Hypertension.  Sonographer:    Mikki Santee RDCS (AE) Referring Phys: [place]  1. Left ventricular ejection fraction, by estimation, is 55 to 60%. The left ventricle has normal function. The left ventricle has no regional wall motion abnormalities. There is mild concentric left ventricular hypertrophy. Left ventricular diastolic parameters are consistent with Grade I diastolic dysfunction (impaired relaxation).  2. Right ventricular systolic function is normal. The right ventricular size is normal.  3. Left atrial size was moderately dilated.  4. Right atrial size was mildly dilated.  5. The mitral valve is grossly normal. Trivial mitral valve regurgitation. No evidence of mitral stenosis. Moderate mitral annular calcification.  6. The aortic valve is tricuspid. There is mild calcification of the aortic valve. Aortic valve regurgitation is mild. No aortic stenosis is present. Comparison(s): No significant change from prior study. Conclusion(s)/Recommendation(s): Otherwise normal echocardiogram, with minor abnormalities described in the report. FINDINGS  Left Ventricle: Left ventricular ejection fraction, by estimation, is 55 to 60%. The left ventricle has normal function. The left ventricle has no regional wall motion abnormalities. The left ventricular internal cavity size was normal in size. There is  mild concentric left ventricular  hypertrophy. Left ventricular diastolic parameters are consistent with Grade I diastolic dysfunction (impaired relaxation). Right Ventricle: The right ventricular size is normal. Right vetricular wall thickness was not well visualized. Right ventricular systolic function is normal. Left Atrium: Left atrial size was moderately dilated. Right Atrium: Right atrial size was mildly dilated. Pericardium: There is no evidence of pericardial effusion. Mitral Valve: The mitral valve is grossly normal. Moderate mitral annular calcification. Trivial mitral valve regurgitation. No evidence of mitral valve stenosis. Tricuspid Valve: The tricuspid valve is grossly normal. Tricuspid valve regurgitation is trivial. Aortic Valve: The aortic valve is tricuspid. There is mild calcification of the aortic valve. Aortic valve regurgitation is mild. Aortic regurgitation PHT measures 880 msec. No aortic stenosis is present. Pulmonic Valve: The pulmonic valve was grossly normal. Pulmonic valve regurgitation is not visualized.  No evidence of pulmonic stenosis. Aorta: The aortic root and ascending aorta are structurally normal, with no evidence of dilitation and the aortic arch was not well visualized. Venous: The inferior vena cava was not well visualized. IAS/Shunts: The atrial septum is grossly normal.  LEFT VENTRICLE PLAX 2D LVIDd:         3.70 cm  Diastology LVIDs:         2.65 cm  LV e' medial:    4.24 cm/s LV PW:         1.30 cm  LV E/e' medial:  10.4 LV IVS:        1.30 cm  LV e' lateral:   6.96 cm/s LVOT diam:     2.30 cm  LV E/e' lateral: 6.3 LV SV:         76 LV SV Index:   37 LVOT Area:     4.15 cm  LEFT ATRIUM             Index       RIGHT ATRIUM           Index LA diam:        4.70 cm 2.33 cm/m  RA Area:     18.00 cm LA Vol (A2C):   56.6 ml 28.03 ml/m RA Volume:   44.50 ml  22.04 ml/m LA Vol (A4C):   84.6 ml 41.90 ml/m LA Biplane Vol: 70.2 ml 34.76 ml/m  AORTIC VALVE LVOT Vmax:   70.80 cm/s LVOT Vmean:  52.400 cm/s LVOT  VTI:    0.182 m AI PHT:      880 msec  AORTA Ao Root diam: 3.60 cm MITRAL VALVE MV Area (PHT): 2.07 cm    SHUNTS MV Decel Time: 366 msec    Systemic VTI:  0.18 m MV E velocity: 44.00 cm/s  Systemic Diam: 2.30 cm MV A velocity: 94.00 cm/s MV E/A ratio:  0.47 Buford Dresser MD Electronically signed by Buford Dresser MD Signature Date/Time: 01/18/2021/1:48:28 PM    Final    VAS [place] LOWER EXTREMITY VENOUS (DVT)  Result Date: 01/18/2021  Lower Venous DVT Study Indications: Edema.  Comparison Study: No previous exams Performing Technologist: Rogelia Rohrer  Examination Guidelines: A complete evaluation includes B-mode imaging, spectral Doppler, color Doppler, and power Doppler as needed of all accessible portions of each vessel. Bilateral testing is considered an integral part of a complete examination. Limited examinations for reoccurring indications may be performed as noted. The reflux portion of the exam is performed with the patient in reverse Trendelenburg.  +---------+---------------+---------+-----------+----------+-------------------+ RIGHT    CompressibilityPhasicitySpontaneityPropertiesThrombus Aging      +---------+---------------+---------+-----------+----------+-------------------+ CFV      Full           Yes      Yes                                      +---------+---------------+---------+-----------+----------+-------------------+ SFJ      Full                                                             +---------+---------------+---------+-----------+----------+-------------------+ FV Prox  Full           Yes  Yes                                      +---------+---------------+---------+-----------+----------+-------------------+ FV Mid   Full           Yes      Yes                                      +---------+---------------+---------+-----------+----------+-------------------+ FV DistalFull           Yes      Yes                                       +---------+---------------+---------+-----------+----------+-------------------+ PFV      Full                                                             +---------+---------------+---------+-----------+----------+-------------------+ POP      Full           Yes      Yes                                      +---------+---------------+---------+-----------+----------+-------------------+ PTV      Full                                         Not well visualized +---------+---------------+---------+-----------+----------+-------------------+ PERO     Full                                         Not well visualized +---------+---------------+---------+-----------+----------+-------------------+   +---------+---------------+---------+-----------+----------+--------------+ LEFT     CompressibilityPhasicitySpontaneityPropertiesThrombus Aging +---------+---------------+---------+-----------+----------+--------------+ CFV      Full           Yes      Yes                                 +---------+---------------+---------+-----------+----------+--------------+ SFJ      Full                                                        +---------+---------------+---------+-----------+----------+--------------+ FV Prox  Full           Yes      Yes                                 +---------+---------------+---------+-----------+----------+--------------+ FV Mid   Full           Yes      Yes                                 +---------+---------------+---------+-----------+----------+--------------+  FV DistalFull           Yes      Yes                                 +---------+---------------+---------+-----------+----------+--------------+ PFV      Full                                                        +---------+---------------+---------+-----------+----------+--------------+ POP      Full           Yes      Yes                                  +---------+---------------+---------+-----------+----------+--------------+ PTV      Full                                                        +---------+---------------+---------+-----------+----------+--------------+ PERO     Full                                                        +---------+---------------+---------+-----------+----------+--------------+     Summary: BILATERAL: - No evidence of deep vein thrombosis seen in the lower extremities, bilaterally. - No evidence of superficial venous thrombosis in the lower extremities, bilaterally. -No evidence of popliteal cyst, bilaterally.   *See table(s) above for measurements and observations. Electronically signed by Servando Snare MD on 01/18/2021 at 1:06:35 PM.    Final        Subjective: Patient seen and examined at bedside.  Poor historian.  Denies any chest pain, worsening shortness of breath.  No overnight fever or vomiting reported.  Discharge Exam: Vitals:   01/23/21 0046 01/23/21 0419  BP: 137/82 118/76  Pulse: 74 62  Resp: 20 18  Temp: 97.8 F (36.6 C) 98.2 F (36.8 C)  SpO2: 100% 99%    General: Pt is alert, awake, not in acute distress.  Elderly male lying in bed.  Poor historian.  Currently on room air. Cardiovascular: rate controlled, S1/S2 + Respiratory: bilateral decreased breath sounds at bases with some scattered crackles Abdominal: Soft, NT, ND, bowel sounds + Extremities: no edema, no cyanosis    The results of significant diagnostics from this hospitalization (including imaging, microbiology, ancillary and laboratory) are listed below for reference.     Microbiology: Recent Results (from the past 240 hour(s))  SARS CORONAVIRUS 2 (TAT 6-24 HRS) Nasopharyngeal Nasopharyngeal Swab     Status: None   Collection Time: 01/17/21 12:39 PM   Specimen: Nasopharyngeal Swab  Result Value Ref Range Status   SARS Coronavirus 2 NEGATIVE NEGATIVE Final    Comment: (NOTE) SARS-CoV-2 target nucleic  acids are NOT DETECTED.  The SARS-CoV-2 RNA is generally detectable in upper and lower respiratory specimens during the acute phase of infection. Negative results do not preclude SARS-CoV-2 infection, do not rule  out co-infections with other pathogens, and should not be used as the sole basis for treatment or other patient management decisions. Negative results must be combined with clinical observations, patient history, and epidemiological information. The expected result is Negative.  Fact Sheet for Patients: SugarRoll.be  Fact Sheet for Healthcare Providers: https://www.woods-mathews.com/  This test is not yet approved or cleared by the [place] FDA and  has been authorized for detection and/or diagnosis of SARS-CoV-2 by FDA under an Emergency Use Authorization (EUA). This EUA will remain  in effect (meaning this test can be used) for the duration of the COVID-19 declaration under Se ction 564(b)(1) of the Act, 21 U.S.C. section 360bbb-3(b)(1), unless the authorization is terminated or revoked sooner.  Performed at [facility], [place] [address], [place], [place] [address]   SARS CORONAVIRUS 2 (TAT 6-24 HRS) Nasopharyngeal Nasopharyngeal Swab     Status: None   Collection Time: 01/22/21  9:13 AM   Specimen: Nasopharyngeal Swab  Result Value Ref Range Status   SARS Coronavirus 2 NEGATIVE NEGATIVE Final    Comment: (NOTE) SARS-CoV-2 target nucleic acids are NOT DETECTED.  The SARS-CoV-2 RNA is generally detectable in upper and lower respiratory specimens during the acute phase of infection. Negative results do not preclude SARS-CoV-2 infection, do not rule out co-infections with other pathogens, and should not be used as the sole basis for treatment or other patient management decisions. Negative results must be combined with clinical observations, patient history, and epidemiological information. The expected result is  Negative.  Fact Sheet for Patients: SugarRoll.be  Fact Sheet for Healthcare Providers: https://www.woods-mathews.com/  This test is not yet approved or cleared by the [place] FDA and  has been authorized for detection and/or diagnosis of SARS-CoV-2 by FDA under an Emergency Use Authorization (EUA). This EUA will remain  in effect (meaning this test can be used) for the duration of the COVID-19 declaration under Se ction 564(b)(1) of the Act, 21 U.S.C. section 360bbb-3(b)(1), unless the authorization is terminated or revoked sooner.  Performed at [facility], [place] [address] [place], [place] [address]      Labs: BNP (last 3 results) Recent Labs    01/17/21 0856  BNP 213.0*   Basic Metabolic Panel: Recent Labs  Lab 01/17/21 0856 01/18/21 0421 01/19/21 0316 01/20/21 0436  NA 138 138 134* 134*  K 3.7 4.0 3.6 3.6  CL 103 103 103 104  CO2 21* 22 20* 22  GLUCOSE 93 88 79 103*  BUN 30* 30* 26* 18  CREATININE 1.66* 1.74* 1.35* 1.19  CALCIUM 9.8 9.2 8.5* 8.4*  MG  --   --  1.9 1.6*   Liver Function Tests: Recent Labs  Lab 01/18/21 0421 01/19/21 0316  AST 26 21  ALT 15 13  ALKPHOS 39 36*  BILITOT 1.4* 1.0  PROT 5.8* 5.6*  ALBUMIN 3.3* 3.0*   No results for input(s): LIPASE, AMYLASE in the last 168 hours. No results for input(s): AMMONIA in the last 168 hours. CBC: Recent Labs  Lab 01/17/21 0856 01/18/21 0421 01/19/21 0316 01/20/21 0436  WBC 9.1 7.3 4.8 6.1  NEUTROABS 7.7  --  3.4 4.7  HGB 15.4 14.8 13.8 14.4  HCT 45.7 44.2 41.8 39.5  MCV 97.4 97.4 98.6 93.8  PLT 145* 134* 122* 137*   Cardiac Enzymes: No results for input(s): CKTOTAL, CKMB, CKMBINDEX, TROPONINI in the last 168 hours. BNP: Invalid input(s): POCBNP CBG: No results for input(s): GLUCAP in the last 168 hours.  D-Dimer No results for input(s): DDIMER in the last 72 hours. Hgb A1c No results for input(s): HGBA1C in the last 72  hours. Lipid Profile No results for input(s): CHOL, HDL, LDLCALC, TRIG, CHOLHDL, LDLDIRECT in the last 72 hours. Thyroid function studies No results for input(s): TSH, T4TOTAL, T3FREE, THYROIDAB in the last 72 hours.  Invalid input(s): FREET3 Anemia work up No results for input(s): VITAMINB12, FOLATE, FERRITIN, TIBC, IRON, RETICCTPCT in the last 72 hours. Urinalysis    Component Value Date/Time   COLORURINE YELLOW 01/17/2021 2106   APPEARANCEUR CLEAR 01/17/2021 2106   LABSPEC 1.020 01/17/2021 2106   PHURINE 5.5 01/17/2021 2106   GLUCOSEU NEGATIVE 01/17/2021 2106   HGBUR NEGATIVE 01/17/2021 2106   BILIRUBINUR NEGATIVE 01/17/2021 2106   BILIRUBINUR n 10/12/2015 1553   KETONESUR NEGATIVE 01/17/2021 2106   PROTEINUR NEGATIVE 01/17/2021 2106   UROBILINOGEN 0.2 10/12/2015 1553   UROBILINOGEN 0.2 11/11/2014 1524   NITRITE NEGATIVE 01/17/2021 2106   LEUKOCYTESUR NEGATIVE 01/17/2021 2106   Sepsis Labs Invalid input(s): PROCALCITONIN,  WBC,  LACTICIDVEN Microbiology Recent Results (from the past 240 hour(s))  SARS CORONAVIRUS 2 (TAT 6-24 HRS) Nasopharyngeal Nasopharyngeal Swab     Status: None   Collection Time: 01/17/21 12:39 PM   Specimen: Nasopharyngeal Swab  Result Value Ref Range Status   SARS Coronavirus 2 NEGATIVE NEGATIVE Final    Comment: (NOTE) SARS-CoV-2 target nucleic acids are NOT DETECTED.  The SARS-CoV-2 RNA is generally detectable in upper and lower respiratory specimens during the acute phase of infection. Negative results do not preclude SARS-CoV-2 infection, do not rule out co-infections with other pathogens, and should not be used as the sole basis for treatment or other patient management decisions. Negative results must be combined with clinical observations, patient history, and epidemiological information. The expected result is Negative.  Fact Sheet for Patients: SugarRoll.be  Fact Sheet for Healthcare  Providers: https://www.woods-mathews.com/  This test is not yet approved or cleared by the [place] FDA and  has been authorized for detection and/or diagnosis of SARS-CoV-2 by FDA under an Emergency Use Authorization (EUA). This EUA will remain  in effect (meaning this test can be used) for the duration of the COVID-19 declaration under Se ction 564(b)(1) of the Act, 21 U.S.C. section 360bbb-3(b)(1), unless the authorization is terminated or revoked sooner.  Performed at [facility], [place] [address] [place], [place] [address]   SARS CORONAVIRUS 2 (TAT 6-24 HRS) Nasopharyngeal Nasopharyngeal Swab     Status: None   Collection Time: 01/22/21  9:13 AM   Specimen: Nasopharyngeal Swab  Result Value Ref Range Status   SARS Coronavirus 2 NEGATIVE NEGATIVE Final    Comment: (NOTE) SARS-CoV-2 target nucleic acids are NOT DETECTED.  The SARS-CoV-2 RNA is generally detectable in upper and lower respiratory specimens during the acute phase of infection. Negative results do not preclude SARS-CoV-2 infection, do not rule out co-infections with other pathogens, and should not be used as the sole basis for treatment or other patient management decisions. Negative results must be combined with clinical observations, patient history, and epidemiological information. The expected result is Negative.  Fact Sheet for Patients: SugarRoll.be  Fact Sheet for Healthcare Providers: https://www.woods-mathews.com/  This test is not yet approved or cleared by the [place] FDA and  has been authorized for detection and/or diagnosis of SARS-CoV-2 by FDA under an Emergency Use Authorization (EUA). This EUA will remain  in effect (meaning this test can be used) for the duration of the COVID-19  declaration under Se ction 564(b)(1) of the Act, 21 U.S.C. section 360bbb-3(b)(1), unless the authorization is terminated or revoked  sooner.  Performed at [facility], [place] [address] [place], [place] [address]      Time coordinating discharge: 35 minutes  SIGNED:   Aline August, MD  [facility] 01/23/2021, 10:07 AM

## 2021-01-23 NOTE — Care Management Important Message (Signed)
Important Message  Patient Details  Name: Clinton [name] MRN: [id number] Date of Birth: [birth year]   Medicare Important Message Given:  Yes     Shelda Altes 01/23/2021, 11:11 AM

## 2021-01-23 NOTE — TOC Transition Note (Signed)
Transition of Care [facility]) - CM/SW Discharge Note   Patient Details  Name: Clinton [name] MRN: [id number] Date of Birth: March 29, 1941  Transition of Care [facility]) CM/SW Contact:  Bethann Berkshire, [place] Phone Number: 01/23/2021, 11:25 AM   Clinical Narrative:     Patient will DC to: [facility] Anticipated DC date: 01/23/21 Family notified: [name], son Transport by: Corey Harold   Per MD patient ready for DC to [facility] . RN, patient, patient's family, and facility notified of DC. Discharge Summary and FL2 sent to facility. RN to call report prior to discharge [phone]). DC packet on chart. Ambulance transport requested for patient.   CSW will sign off for now as social work intervention is no longer needed. Please consult [place] again if new needs arise.    Final next level of care: Skilled Nursing Facility Barriers to Discharge: No Barriers Identified   Patient Goals and CMS Choice Patient states their goals for this hospitalization and ongoing recovery are:: To eventually return. Pt is okay with SNF for rehab prior to home      Discharge Placement              Patient [facility] bed at: [facility] Patient to be transferred to facility by: [place] Name of family member notified: Eissler,Peter (Son)   [phone] [facility] Phone) Patient and family notified of of transfer: 01/23/21  Discharge Plan and Services                                     Social Determinants of Health (SDOH) Interventions     Readmission Risk Interventions No flowsheet data found.

## 2021-01-23 NOTE — Plan of Care (Signed)
  Problem: Education: Goal: Knowledge of General Education information will improve Description: Including pain rating scale, medication(s)/side effects and non-pharmacologic comfort measures Outcome: Progressing   Problem: Health Behavior/Discharge Planning: Goal: Ability to manage health-related needs will improve Outcome: Progressing   Problem: Education: Goal: Ability to demonstrate management of disease process will improve Outcome: Progressing

## 2021-01-23 NOTE — Plan of Care (Signed)

## 2021-01-24 ENCOUNTER — Non-Acute Institutional Stay (SKILLED_NURSING_FACILITY): Payer: Medicare Other | Admitting: Adult Health

## 2021-01-24 ENCOUNTER — Encounter: Payer: Self-pay | Admitting: Adult Health

## 2021-01-24 DIAGNOSIS — N179 Acute kidney failure, unspecified: Secondary | ICD-10-CM | POA: Diagnosis not present

## 2021-01-24 DIAGNOSIS — R001 Bradycardia, unspecified: Secondary | ICD-10-CM | POA: Diagnosis not present

## 2021-01-24 DIAGNOSIS — I10 Essential (primary) hypertension: Secondary | ICD-10-CM

## 2021-01-24 DIAGNOSIS — R55 Syncope and collapse: Secondary | ICD-10-CM

## 2021-01-24 NOTE — Progress Notes (Addendum)
Location:  [place] Room Number: 108/A Place of Service:  SNF (31) Provider:  Durenda Age, DNP, FNP-BC  Patient Care Team: Clinton Peng, NP as PCP - General (Family Medicine) Clinton Pain, MD as PCP - Cardiology (Cardiology)  Extended Emergency Contact Information Primary Emergency Contact: [place], [place] [address] [name] of [place] Phone: [phone] Relation: Son Secondary Emergency Contact: Cassandra, [place] Phone: [phone] Relation: Son  Code Status:  Full Code  Goals of care: Advanced Directive information Advanced Directives 01/24/2021  Does Patient Have a Medical Advance Directive? No  Type of Advance Directive -  Does patient want to make changes to medical advance directive? No - Patient declined  Copy of [place] in Chart? -  Would patient like information on creating a medical advance directive? -     Chief Complaint  Patient presents with  . Hospitalization Follow-up    Hospitalization Follow Up     HPI:  Pt is a 80 y.o. male who was admitted to [facility] on  01/23/21 pos hospitalization 01/17/21 to 01/23/21. He has a PMH of CAD and arthritis. He had a fall at home, while walking with his walker, hit his head but did not lose consciousness. CT of the head and cervical spine were unremarkable. Creatinine was 1.6 from 0.9 in March 2021, BNP was 261. He was given 1 dose of Lasix in the ED due to lower extremity edema. He was also found to be bradycardic. Cardiology was consulted and recommended to resume metoprolol at the lower dose if needed.  He was seen in his room today. He talked about having lived in different states throughout his life.   Past Medical History:  Diagnosis Date  . Anxiety   . Arthritis   . Coronary artery disease    have an appointment with Dr Johnsie Cancel 11/15/14  . Erectile dysfunction   . Fall 01/17/2021  . Frozen shoulder    . GERD (gastroesophageal reflux disease)   . History of elevated lipids   . Hypertension   . Osteoarthritis   . Spinal stenosis 11/24/2014   Past Surgical History:  Procedure Laterality Date  . CARDIAC SURGERY     2 stents placed   . CORONARY ANGIOPLASTY     mid and proximal LAD stent 09/1999 [facility])  . posterior lumbar fusion L4-5  11-24-14    No Known Allergies  Outpatient Encounter Medications as of 01/24/2021  Medication Sig  . amLODipine (NORVASC) 10 MG tablet Take 1 tablet (10 mg total) by mouth daily.  Marland Kitchen aspirin 81 MG chewable tablet Chew 81 mg by mouth daily.  . calcium elemental as carbonate (BARIATRIC TUMS ULTRA) 400 MG chewable tablet Chew 1,000 mg by mouth 3 (three) times daily.  . metoprolol tartrate (LOPRESSOR) 25 MG tablet Take 0.5 tablets (12.5 mg total) by mouth 2 (two) times daily.   No facility-administered encounter medications on file as of 01/24/2021.    Review of Systems  GENERAL: No fever or chills  MOUTH and THROAT: Denies oral discomfort, gingival [name] or bleeding RESPIRATORY: no cough, SOB, DOE, wheezing, hemoptysis CARDIAC: No chest [name], edema or palpitations GI: No abdominal [name], diarrhea, constipation, heart burn, nausea or vomiting GU: Denies dysuria, frequency, hematuria or discharge NEUROLOGICAL: Denies dizziness, syncope, numbness, or headache PSYCHIATRIC: Denies feelings of depression or anxiety. No report of hallucinations, insomnia, paranoia, or agitation   Immunization  History  Administered Date(s) Administered  . Moderna Sars-Covid-2 Vaccination 01/28/2020, 02/12/2020  . Tdap 04/16/2020   Pertinent  Health Maintenance Due  Topic Date Due  . PNA vac Low Risk Adult (1 of 2 - PCV13) Never done  . INFLUENZA VACCINE  Never done   Fall Risk  03/05/2017 08/07/2015  Falls in the past year? No No     Vitals:   01/24/21 1027  BP: 138/78  Pulse: (!) 58  Resp: 18  Temp: 98.3 F (36.8 C)  Weight: 161 lb 3.2 oz (73.1  kg)  Height: 5\' 10"  (1.778 m)   Body mass index is 23.13 kg/m.  Physical Exam  GENERAL APPEARANCE: Well nourished. In no acute distress. Normal body habitus SKIN:  Skin is warm and dry.  MOUTH and THROAT: Lips are without lesions. Oral mucosa is moist and without lesions. Tongue is normal in shape, size, and color and without lesions RESPIRATORY: Breathing is even & unlabored, BS CTAB CARDIAC: RRR, no murmur,no extra heart sounds, no edema GI: Abdomen soft, normal BS, no masses, no tenderness NEUROLOGICAL: There is no tremor. Speech is clear. Alert and oriented X 3.  PSYCHIATRIC: Affect and behavior are appropriate  Labs reviewed: Recent Labs    01/18/21 0421 01/19/21 0316 01/20/21 0436  NA 138 134* 134*  K 4.0 3.6 3.6  CL 103 103 104  CO2 22 20* 22  GLUCOSE 88 79 103*  BUN 30* 26* 18  CREATININE 1.74* 1.35* 1.19  CALCIUM 9.2 8.5* 8.4*  MG  --  1.9 1.6*   Recent Labs    01/18/21 0421 01/19/21 0316  AST 26 21  ALT 15 13  ALKPHOS 39 36*  BILITOT 1.4* 1.0  PROT 5.8* 5.6*  ALBUMIN 3.3* 3.0*   Recent Labs    01/17/21 0856 01/18/21 0421 01/19/21 0316 01/20/21 0436  WBC 9.1 7.3 4.8 6.1  NEUTROABS 7.7  --  3.4 4.7  HGB 15.4 14.8 13.8 14.4  HCT 45.7 44.2 41.8 39.5  MCV 97.4 97.4 98.6 93.8  PLT 145* 134* 122* 137*   Lab Results  Component Value Date   TSH 3.418 01/18/2021   Lab Results  Component Value Date   HGBA1C 5.6 04/16/2018   Lab Results  Component Value Date   CHOL 179 04/16/2018   HDL 52.50 04/16/2018   LDLCALC 114 (H) 04/16/2018   TRIG 63.0 04/16/2018   CHOLHDL 3 04/16/2018    Significant Diagnostic Results in last 30 days:  CT Head Wo Contrast  Result Date: 01/17/2021 CLINICAL DATA:  80 year old male status post fall at home. Facial trauma. EXAM: CT HEAD WITHOUT CONTRAST TECHNIQUE: Contiguous axial images were obtained from the base of the skull through the vertex without intravenous contrast. COMPARISON:  Head CT 04/16/2020. FINDINGS:  Brain: Stable cerebral volume from last year. No midline shift, ventriculomegaly, mass effect, evidence of mass lesion, intracranial hemorrhage or evidence of cortically based acute infarction. Stable gray-white matter differentiation throughout the brain. Patchy left greater than right hemisphere white matter hypodensity, with chronic involvement of the left basal ganglia. Moderate heterogeneity in the pons appears stable. And better demonstrated on the sagittal image a chronic discrete pontine lacunar infarct is stable. No cortical encephalomalacia identified. Vascular: Extensive Calcified atherosclerosis at the skull base. No suspicious intracranial vascular hyperdensity. Skull: No acute osseous abnormality identified. Sinuses/Orbits: Visualized paranasal sinuses and mastoids are stable and well pneumatized. Other: No acute orbit or scalp soft tissue injury identified. Negative visible face soft tissues. IMPRESSION: 1. No acute  traumatic injury identified. 2. Chronic small vessel disease appears stable by CT from last year. Electronically Signed   By: Genevie Ann M.D.   On: 01/17/2021 05:17   CT Cervical Spine Wo Contrast  Result Date: 01/17/2021 CLINICAL DATA:  80 year old male status post fall at home. Facial trauma. EXAM: CT CERVICAL SPINE WITHOUT CONTRAST TECHNIQUE: Multidetector CT imaging of the cervical spine was performed without intravenous contrast. Multiplanar CT image reconstructions were also generated. COMPARISON:  Head CT today reported separately. Cervical spine CT 04/16/2020. FINDINGS: Alignment: Straightening of cervical lordosis today, less reversal of lordosis compared to last year. Cervicothoracic junction alignment is within normal limits. Bilateral posterior element alignment is within normal limits. Skull base and vertebrae: Visualized skull base is intact. No atlanto-occipital dissociation. C1 and C2 remain normally aligned. C2 vertebra appears stable and intact, with prominent nutrient  foramina noted at the junction of the body and pedicles (on the left series 9, image 19). No acute osseous abnormality identified. Soft tissues and spinal canal: No prevertebral fluid or swelling. No visible canal hematoma. Retropharyngeal course of the carotids in the neck with calcified atherosclerosis. Disc levels: Stable chronic cervical disc and endplate degeneration, prominent right upper cervical spine facet arthropathy. Upper chest: Visible upper thoracic levels appear grossly intact. Negative lung apices. Calcified aortic atherosclerosis. Other: Head CT today reported separately. IMPRESSION: 1. No acute traumatic injury identified in the cervical spine. 2. Stable chronic cervical spine degeneration. 3. Aortic Atherosclerosis (ICD10-I70.0). Electronically Signed   By: Genevie Ann M.D.   On: 01/17/2021 05:23   DG Chest Port 1 View  Result Date: 01/17/2021 CLINICAL DATA:  Weakness. EXAM: PORTABLE CHEST 1 VIEW COMPARISON:  11/11/2014 FINDINGS: Heart size and vascularity normal. Mild left lower lobe airspace disease likely atelectasis. Right lung clear. No effusion. IMPRESSION: Mild left lower lobe atelectasis/infiltrate. Electronically Signed   By: Franchot Gallo M.D.   On: 01/17/2021 13:18   ECHOCARDIOGRAM COMPLETE  Result Date: 01/18/2021    ECHOCARDIOGRAM REPORT   Patient Name:   Clinton [name] [facility] Date of Exam: 01/18/2021 Medical Rec #:  [id number]          Height:       70.0 in Accession #:    [id number]         Weight:       185.0 lb Date of Birth:  1941/03/23          BSA:          2.019 m Patient Age:    68 years           BP:           129/84 mmHg Patient Gender: M                  HR:           51 bpm. Exam Location:  Inpatient Procedure: 2D Echo Indications:    Congestive Heart Failure  History:        Patient has no prior history of Echocardiogram examinations.                 CAD; Risk Factors:Hypertension.  Sonographer:    Mikki Santee RDCS (AE) Referring Phys: [place]  1. Left ventricular ejection fraction, by estimation, is 55 to 60%. The left ventricle has normal function. The left ventricle has no regional wall motion abnormalities. There is mild concentric left ventricular hypertrophy. Left ventricular diastolic parameters are consistent with Grade I diastolic  dysfunction (impaired relaxation).  2. Right ventricular systolic function is normal. The right ventricular size is normal.  3. Left atrial size was moderately dilated.  4. Right atrial size was mildly dilated.  5. The mitral valve is grossly normal. Trivial mitral valve regurgitation. No evidence of mitral stenosis. Moderate mitral annular calcification.  6. The aortic valve is tricuspid. There is mild calcification of the aortic valve. Aortic valve regurgitation is mild. No aortic stenosis is present. Comparison(s): No significant change from prior study. Conclusion(s)/Recommendation(s): Otherwise normal echocardiogram, with minor abnormalities described in the report. FINDINGS  Left Ventricle: Left ventricular ejection fraction, by estimation, is 55 to 60%. The left ventricle has normal function. The left ventricle has no regional wall motion abnormalities. The left ventricular internal cavity size was normal in size. There is  mild concentric left ventricular hypertrophy. Left ventricular diastolic parameters are consistent with Grade I diastolic dysfunction (impaired relaxation). Right Ventricle: The right ventricular size is normal. Right vetricular wall thickness was not well visualized. Right ventricular systolic function is normal. Left Atrium: Left atrial size was moderately dilated. Right Atrium: Right atrial size was mildly dilated. Pericardium: There is no evidence of pericardial effusion. Mitral Valve: The mitral valve is grossly normal. Moderate mitral annular calcification. Trivial mitral valve regurgitation. No evidence of mitral valve stenosis. Tricuspid Valve: The tricuspid valve is  grossly normal. Tricuspid valve regurgitation is trivial. Aortic Valve: The aortic valve is tricuspid. There is mild calcification of the aortic valve. Aortic valve regurgitation is mild. Aortic regurgitation PHT measures 880 msec. No aortic stenosis is present. Pulmonic Valve: The pulmonic valve was grossly normal. Pulmonic valve regurgitation is not visualized. No evidence of pulmonic stenosis. Aorta: The aortic root and ascending aorta are structurally normal, with no evidence of dilitation and the aortic arch was not well visualized. Venous: The inferior vena cava was not well visualized. IAS/Shunts: The atrial septum is grossly normal.  LEFT VENTRICLE PLAX 2D LVIDd:         3.70 cm  Diastology LVIDs:         2.65 cm  LV e' medial:    4.24 cm/s LV PW:         1.30 cm  LV E/e' medial:  10.4 LV IVS:        1.30 cm  LV e' lateral:   6.96 cm/s LVOT diam:     2.30 cm  LV E/e' lateral: 6.3 LV SV:         76 LV SV Index:   37 LVOT Area:     4.15 cm  LEFT ATRIUM             Index       RIGHT ATRIUM           Index LA diam:        4.70 cm 2.33 cm/m  RA Area:     18.00 cm LA Vol (A2C):   56.6 ml 28.03 ml/m RA Volume:   44.50 ml  22.04 ml/m LA Vol (A4C):   84.6 ml 41.90 ml/m LA Biplane Vol: 70.2 ml 34.76 ml/m  AORTIC VALVE LVOT Vmax:   70.80 cm/s LVOT Vmean:  52.400 cm/s LVOT VTI:    0.182 m AI PHT:      880 msec  AORTA Ao Root diam: 3.60 cm MITRAL VALVE MV Area (PHT): 2.07 cm    SHUNTS MV Decel Time: 366 msec    Systemic VTI:  0.18 m MV E velocity: 44.00 cm/s  Systemic  Diam: 2.30 cm MV A velocity: 94.00 cm/s MV E/A ratio:  0.47 Buford Dresser MD Electronically signed by Buford Dresser MD Signature Date/Time: 01/18/2021/1:48:28 PM    Final    VAS [place] LOWER EXTREMITY VENOUS (DVT)  Result Date: 01/18/2021  Lower Venous DVT Study Indications: Edema.  Comparison Study: No previous exams Performing Technologist: Rogelia Rohrer  Examination Guidelines: A complete evaluation includes B-mode imaging, spectral  Doppler, color Doppler, and power Doppler as needed of all accessible portions of each vessel. Bilateral testing is considered an integral part of a complete examination. Limited examinations for reoccurring indications may be performed as noted. The reflux portion of the exam is performed with the patient in reverse Trendelenburg.  +---------+---------------+---------+-----------+----------+-------------------+ RIGHT    CompressibilityPhasicitySpontaneityPropertiesThrombus Aging      +---------+---------------+---------+-----------+----------+-------------------+ CFV      Full           Yes      Yes                                      +---------+---------------+---------+-----------+----------+-------------------+ SFJ      Full                                                             +---------+---------------+---------+-----------+----------+-------------------+ FV Prox  Full           Yes      Yes                                      +---------+---------------+---------+-----------+----------+-------------------+ FV Mid   Full           Yes      Yes                                      +---------+---------------+---------+-----------+----------+-------------------+ FV DistalFull           Yes      Yes                                      +---------+---------------+---------+-----------+----------+-------------------+ PFV      Full                                                             +---------+---------------+---------+-----------+----------+-------------------+ POP      Full           Yes      Yes                                      +---------+---------------+---------+-----------+----------+-------------------+ PTV      Full  Not well visualized +---------+---------------+---------+-----------+----------+-------------------+ PERO     Full                                         Not well  visualized +---------+---------------+---------+-----------+----------+-------------------+   +---------+---------------+---------+-----------+----------+--------------+ LEFT     CompressibilityPhasicitySpontaneityPropertiesThrombus Aging +---------+---------------+---------+-----------+----------+--------------+ CFV      Full           Yes      Yes                                 +---------+---------------+---------+-----------+----------+--------------+ SFJ      Full                                                        +---------+---------------+---------+-----------+----------+--------------+ FV Prox  Full           Yes      Yes                                 +---------+---------------+---------+-----------+----------+--------------+ FV Mid   Full           Yes      Yes                                 +---------+---------------+---------+-----------+----------+--------------+ FV DistalFull           Yes      Yes                                 +---------+---------------+---------+-----------+----------+--------------+ PFV      Full                                                        +---------+---------------+---------+-----------+----------+--------------+ POP      Full           Yes      Yes                                 +---------+---------------+---------+-----------+----------+--------------+ PTV      Full                                                        +---------+---------------+---------+-----------+----------+--------------+ PERO     Full                                                        +---------+---------------+---------+-----------+----------+--------------+     Summary: BILATERAL: - No  evidence of deep vein thrombosis seen in the lower extremities, bilaterally. - No evidence of superficial venous thrombosis in the lower extremities, bilaterally. -No evidence of popliteal cyst, bilaterally.   *See table(s) above  for measurements and observations. Electronically signed by Servando Snare MD on 01/18/2021 at 1:06:35 PM.    Final     Assessment/Plan  1. Near syncope -  S/P fall, CT of the head and cervical spine were unremarkable -Fall thought to be due to bradycardia  2. Bradycardia -   Cardiology was consulted and recommended if tachycardia does require, use lower dosing of metoprolol -   Echocardiogram showed EF of 55 to 60% with grade 1 diastolic dysfunction, cardiology signed off on 01/18/2022  3. AKI (acute kidney injury) ([place]) -   Presented with creatinine 1.6, creatinine was 0.9 on 3/21 and 1.19 on 01/20/2021 -   monitor intake  4. Primary hypertension -   BP 138/78, continue amlodipine and metoprolol tartrate      Family/ staff Communication: Discussed plan of care with resident and charge nurse.  Labs/tests ordered: None  Goals of care:   Short-term care   Durenda Age, DNP, MSN, FNP-BC [facility] [phone] (Monday-Friday 8:00 a.m. - 5:00 p.m.) [phone] (after hours)

## 2021-01-25 ENCOUNTER — Encounter: Payer: Self-pay | Admitting: Internal Medicine

## 2021-01-25 ENCOUNTER — Non-Acute Institutional Stay (SKILLED_NURSING_FACILITY): Payer: Medicare Other | Admitting: Internal Medicine

## 2021-01-25 DIAGNOSIS — R001 Bradycardia, unspecified: Secondary | ICD-10-CM | POA: Diagnosis not present

## 2021-01-25 DIAGNOSIS — D696 Thrombocytopenia, unspecified: Secondary | ICD-10-CM | POA: Insufficient documentation

## 2021-01-25 DIAGNOSIS — N179 Acute kidney failure, unspecified: Secondary | ICD-10-CM

## 2021-01-25 DIAGNOSIS — I160 Hypertensive urgency: Secondary | ICD-10-CM

## 2021-01-25 DIAGNOSIS — R55 Syncope and collapse: Secondary | ICD-10-CM | POA: Diagnosis not present

## 2021-01-25 NOTE — Progress Notes (Signed)
NURSING HOME LOCATION:  [facility] ROOM NUMBER: 108/[id number]   CODE STATUS:  Full Code  [id number], Tommi Rumps, NP    This is a comprehensive admission note to [facility] performed on this date less than 30 days from date of admission. Included are preadmission medical/surgical history; reconciled medication list; family history; social history and comprehensive review of systems.  Corrections and additions to the records were documented. Comprehensive physical exam was also performed. Additionally a clinical summary was entered for each active diagnosis pertinent to this admission in the Problem List to enhance continuity of care.  HPI: Patient was hospitalized 2/9/-01/23/2021 after experiencing near syncope and falling while ambulating with his walker.  He did hit his head but did not lose consciousness.  CT of the head and cervical spine were unremarkable.  Creatinine had risen from 0.9 in February 08, 2020 up to 1.6.  He received 1 dose of Lasix in the ED for lower extremity edema.  Bradycardia was documented and he was observed for possible near syncope. Echo revealed EF of 55-60% with grade 1 diastolic dysfunction.  BNP was 261.  Mild hypomagnesemia and hyponatremia were repleted.  Amlodipine was initiated for hypertensive urgency.  Lisinopril was held because of the AKI. Heart rate did improve.  Cardiology resumed the metoprolol at lower dose of 12.5 mg twice a day rather than 25 mg twice daily PTA.   Thrombocytopenia was present without any active bleeding dyscrasias. Because of debilitation; placement in SNF rehab was recommended.  Past medical and surgical history: Includes history of spinal stenosis, essential hypertension, dyslipidemia, GERD, history of anxiety, and CAD.  He states he had Guillain -Barre syndrome following H1 N1 vaccine in 2009.  He has had both COVID vaccines.  He is awaiting the booster because of the history of the issue with the H1N1 vaccine reaction Procedures  include coronary angioplasty and posterior lumbar fusion L4-5.  Social history: Social alcohol; former smoker, quitting in 1975.  Family history: Is positive for heart disease and sudden death.   Review of systems: He denies any cardiac or neurologic prodrome prior to the fall.  He states that he simply tripped going to the bathroom at 4 AM. He initially said that he was "fine".  He does describe a cough related to a "wet cold".  His major complaint is diffuse joint pains that he has had for 4 years.  He does not take any anti-inflammatory medications.  He describes diffuse weakness. He states " I  lost the love of my life", his wife of 57 years on December 29, 2019.  Despite this he denies depression.  Constitutional: No fever, significant weight change, fatigue  Eyes: No redness, discharge, pain, vision change ENT/mouth: No nasal congestion, purulent discharge, earache, change in hearing, sore throat  Cardiovascular: No chest pain, palpitations, paroxysmal nocturnal dyspnea, claudication, edema  Respiratory: No hemoptysis, DOE, significant snoring, apnea Gastrointestinal: No heartburn, dysphagia, abdominal pain, nausea /vomiting, rectal bleeding, melena, change in bowels Genitourinary: No dysuria, hematuria, pyuria, incontinence, nocturia Dermatologic: No rash, pruritus, change in appearance of skin Neurologic: No dizziness, headache, syncope, seizures, numbness, tingling Psychiatric: No significant insomnia, anorexia Endocrine: No change in hair/skin/nails, excessive thirst, excessive hunger, excessive urination  Hematologic/lymphatic: No significant bruising, lymphadenopathy, abnormal bleeding Allergy/immunology: No itchy/watery eyes, significant sneezing, urticaria, angioedema  Physical exam:  Pertinent or positive findings: He appears chronically ill.  Initially he was asleep with his chin on his chest while sitting in the wheelchair.  Once awake his answers initially  tended to be  delayed and  brusque. Subsequently he began to elaborate details such as the history of Guillain -Barre syndrome. His voice was somewhat gravelly.  Pattern alopecia is present.  He has diffuse papular lesions over the crown which are slightly erythematous.  He also has slight erythema around the eyes particularly on the left with a suggestion of an ectropion. No definite conjunctivitis present. Despite this he denies any ophthalmologic symptoms except for the need to "wear reading glasses".  He has a beard and mustache. First and second heart sounds are accentuated.  Pedal pulses are decreased.  Foley catheter is present.  He is profoundly weak to opposition in all limbs.  There is a boss over the MCP joint of the left index finger.  He has extensive bruising over the dorsum of the hands and forearms, greater on the left than the right.  General appearance:no acute distress, increased work of breathing is present.   Lymphatic: No lymphadenopathy about the head, neck, axilla. Eyes: No conjunctival inflammation or lid edema is present. There is no scleral icterus. Ears:  External ear exam shows no significant lesions or deformities.   Nose:  External nasal examination shows no deformity or inflammation. Nasal mucosa are pink and moist without lesions, exudates Oral exam: Lips and gums are healthy appearing.There is no oropharyngeal erythema or exudate. Neck:  No thyromegaly, masses, tenderness noted.    Heart:  Normal rate and regular rhythm without gallop, murmur, click, rub.  Lungs:  without wheezes, rhonchi, rales, rubs. Abdomen: Bowel sounds are normal.  Abdomen is soft and nontender with no organomegaly, hernias, masses. GU: Deferred  Extremities:  No cyanosis, clubbing, edema. Neurologic exam:  Balance, Rhomberg, finger to nose testing could not be completed due to clinical state Skin: Warm & dry w/o tenting.  See clinical summary under each active problem in the Problem List with associated  updated therapeutic plan

## 2021-01-25 NOTE — Assessment & Plan Note (Signed)
He has extensive bruising over the dorsum of the hands and forearms especially on the left.  Monitor for bleeding dyscrasias.

## 2021-01-25 NOTE — Assessment & Plan Note (Signed)
BP controlled; no change in antihypertensive medications  

## 2021-01-25 NOTE — Assessment & Plan Note (Signed)
Continue to avoid nephrotoxic medications.

## 2021-01-25 NOTE — Patient Instructions (Signed)
See assessment and plan under each diagnosis in the problem list and acutely for this visit 

## 2021-01-26 NOTE — Assessment & Plan Note (Signed)
He exhibits diffuse weakness; PT/OT definitely warranted. Home situation will need to be assessed as he is widowed & lives alone.

## 2021-01-26 NOTE — Assessment & Plan Note (Addendum)
TSH therapeutic. Pulse now WNL

## 2021-01-31 ENCOUNTER — Non-Acute Institutional Stay (SKILLED_NURSING_FACILITY): Payer: Medicare Other | Admitting: Adult Health

## 2021-01-31 ENCOUNTER — Encounter: Payer: Self-pay | Admitting: Adult Health

## 2021-01-31 DIAGNOSIS — I10 Essential (primary) hypertension: Secondary | ICD-10-CM | POA: Diagnosis not present

## 2021-01-31 DIAGNOSIS — R002 Palpitations: Secondary | ICD-10-CM

## 2021-01-31 NOTE — Progress Notes (Signed)
Location:  [facility] Room Number: 108/A Place of Service:  SNF (31) Provider:  Durenda Age, DNP, FNP-BC  Patient Care Team: [name], NP as PCP - General (Family Medicine) [name], MD as PCP - Cardiology (Cardiology)  Extended Emergency Contact Information Primary Emergency Contact: [place], [place] [address] [name] of [place] Phone: [phone] Relation: Son Secondary Emergency Contact: [place], [place] Mobile Phone: [phone] Relation: Son  Code Status:  Full Code  Goals of care: Advanced Directive information Advanced Directives 01/31/2021  Does Patient Have a Medical Advance Directive? No  Type of Advance Directive -  Does patient want to make changes to medical advance directive? No - Patient declined  Copy of [place] in Chart? -  Would patient like information on creating a medical advance directive? -     Chief Complaint  Patient presents with  . Medical Management of Chronic Issues    Routine Visit of Medical Management     HPI:  Pt is a 80 y.o. male seen today for medical management of chronic diseases. He is a short-term care resident of [facility]. He has a PMH of CAD and arthritis. He was seen in the room today. He stated that he had 3 episodes of palpitations for the two weeks. HRs ranging from 61 to 76, outlier 53. SBPs ranging from 127 to 155. He currently takes Metoprolol tartrate 12.5 mg BID for hypertension. He denies having chest pain.   Past Medical History:  Diagnosis Date  . Anxiety   . Arthritis   . Coronary artery disease    have an appointment with Dr Johnsie Cancel 11/15/14  . Erectile dysfunction   . Fall 01/17/2021  . Frozen shoulder   . GERD (gastroesophageal reflux disease)   . Guillain Barr syndrome ([place]) 2009   Following the H1 N1 flu vaccine  . History of elevated lipids   . Hypertension   . Osteoarthritis   . Spinal  stenosis 11/24/2014   Past Surgical History:  Procedure Laterality Date  . CARDIAC SURGERY     2 stents placed   . CORONARY ANGIOPLASTY     mid and proximal LAD stent 09/1999 [facility])  . posterior lumbar fusion L4-5  11-24-14    No Known Allergies  Outpatient Encounter Medications as of 01/31/2021  Medication Sig  . amLODipine (NORVASC) 10 MG tablet Take 1 tablet (10 mg total) by mouth daily.  Marland Kitchen aspirin 81 MG chewable tablet Chew 81 mg by mouth daily.  . calcium carbonate (TUMS - DOSED IN MG ELEMENTAL CALCIUM) 500 MG chewable tablet Chew 2 tablets by mouth 3 (three) times daily.  . metoprolol tartrate (LOPRESSOR) 25 MG tablet Take 0.5 tablets (12.5 mg total) by mouth 2 (two) times daily.   No facility-administered encounter medications on file as of 01/31/2021.    Review of Systems  GENERAL: No change in appetite, no fatigue, no weight changes, no fever or chills  MOUTH and THROAT: Denies oral discomfort, gingival pain or bleeding, pain from teeth or hoarseness   RESPIRATORY: no cough, SOB, DOE, wheezing, hemoptysis CARDIAC: No chest pain, edema or palpitations GI: No abdominal pain, diarrhea, constipation, heart burn, nausea or vomiting GU: Denies dysuria, frequency, hematuria, incontinence, or discharge NEUROLOGICAL: Denies dizziness, syncope, numbness, or headache PSYCHIATRIC: Denies feelings of depression or anxiety. No report of hallucinations, insomnia, paranoia, or agitation   Immunization History  Administered  Date(s) Administered  . Moderna Sars-Covid-2 Vaccination 01/28/2020, 02/12/2020  . Tdap 04/16/2020   Pertinent  Health Maintenance Due  Topic Date Due  . PNA vac Low Risk Adult (1 of 2 - PCV13) Never done  . INFLUENZA VACCINE  Never done   Fall Risk  03/05/2017 08/07/2015  Falls in the past year? No No     Vitals:   01/31/21 1558  BP: (!) 155/72  Pulse: 75  Resp: 18  Temp: 97.6 F (36.4 C)  SpO2: 98%  Weight: 160 lb 3.2 oz (72.7 kg)   Height: 5\' 7"  (1.702 m)   Body mass index is 25.09 kg/m.  Physical Exam  GENERAL APPEARANCE: Well nourished. In no acute distress. Normal body habitus SKIN:  Skin is warm and dry.  MOUTH and THROAT: Lips are without lesions. Oral mucosa is moist and without lesions. Tongue is normal in shape, size, and color and without lesions RESPIRATORY: Breathing is even & unlabored, BS CTAB CARDIAC: RRR, no murmur,no extra heart sounds, no edema GI: Abdomen soft, normal BS, no masses, no tenderness EXTREMITIES:  Able to move X 4 extremities NEUROLOGICAL: There is no tremor. Speech is clear. Alert and oriented X3. PSYCHIATRIC:  Affect and behavior are appropriate  Labs reviewed: Recent Labs    01/18/21 0421 01/19/21 0316 01/20/21 0436  NA 138 134* 134*  K 4.0 3.6 3.6  CL 103 103 104  CO2 22 20* 22  GLUCOSE 88 79 103*  BUN 30* 26* 18  CREATININE 1.74* 1.35* 1.19  CALCIUM 9.2 8.5* 8.4*  MG  --  1.9 1.6*   Recent Labs    01/18/21 0421 01/19/21 0316  AST 26 21  ALT 15 13  ALKPHOS 39 36*  BILITOT 1.4* 1.0  PROT 5.8* 5.6*  ALBUMIN 3.3* 3.0*   Recent Labs    01/17/21 0856 01/18/21 0421 01/19/21 0316 01/20/21 0436  WBC 9.1 7.3 4.8 6.1  NEUTROABS 7.7  --  3.4 4.7  HGB 15.4 14.8 13.8 14.4  HCT 45.7 44.2 41.8 39.5  MCV 97.4 97.4 98.6 93.8  PLT 145* 134* 122* 137*   Lab Results  Component Value Date   TSH 3.418 01/18/2021   Lab Results  Component Value Date   HGBA1C 5.6 04/16/2018   Lab Results  Component Value Date   CHOL 179 04/16/2018   HDL 52.50 04/16/2018   LDLCALC 114 (H) 04/16/2018   TRIG 63.0 04/16/2018   CHOLHDL 3 04/16/2018    Significant Diagnostic Results in last 30 days:  CT Head Wo Contrast  Result Date: 01/17/2021 CLINICAL DATA:  80 year old male status post fall at home. Facial trauma. EXAM: CT HEAD WITHOUT CONTRAST TECHNIQUE: Contiguous axial images were obtained from the base of the skull through the vertex without intravenous contrast.  COMPARISON:  Head CT 04/16/2020. FINDINGS: Brain: Stable cerebral volume from last year. No midline shift, ventriculomegaly, mass effect, evidence of mass lesion, intracranial hemorrhage or evidence of cortically based acute infarction. Stable gray-white matter differentiation throughout the brain. Patchy left greater than right hemisphere white matter hypodensity, with chronic involvement of the left basal ganglia. Moderate heterogeneity in the pons appears stable. And better demonstrated on the sagittal image a chronic discrete pontine lacunar infarct is stable. No cortical encephalomalacia identified. Vascular: Extensive Calcified atherosclerosis at the skull base. No suspicious intracranial vascular hyperdensity. Skull: No acute osseous abnormality identified. Sinuses/Orbits: Visualized paranasal sinuses and mastoids are stable and well pneumatized. Other: No acute orbit or scalp soft tissue injury identified. Negative visible  face soft tissues. IMPRESSION: 1. No acute traumatic injury identified. 2. Chronic small vessel disease appears stable by CT from last year. Electronically Signed   By: Genevie Ann M.D.   On: 01/17/2021 05:17   CT Cervical Spine Wo Contrast  Result Date: 01/17/2021 CLINICAL DATA:  80 year old male status post fall at home. Facial trauma. EXAM: CT CERVICAL SPINE WITHOUT CONTRAST TECHNIQUE: Multidetector CT imaging of the cervical spine was performed without intravenous contrast. Multiplanar CT image reconstructions were also generated. COMPARISON:  Head CT today reported separately. Cervical spine CT 04/16/2020. FINDINGS: Alignment: Straightening of cervical lordosis today, less reversal of lordosis compared to last year. Cervicothoracic junction alignment is within normal limits. Bilateral posterior element alignment is within normal limits. Skull base and vertebrae: Visualized skull base is intact. No atlanto-occipital dissociation. C1 and C2 remain normally aligned. C2 vertebra appears  stable and intact, with prominent nutrient foramina noted at the junction of the body and pedicles (on the left series 9, image 19). No acute osseous abnormality identified. Soft tissues and spinal canal: No prevertebral fluid or swelling. No visible canal hematoma. Retropharyngeal course of the carotids in the neck with calcified atherosclerosis. Disc levels: Stable chronic cervical disc and endplate degeneration, prominent right upper cervical spine facet arthropathy. Upper chest: Visible upper thoracic levels appear grossly intact. Negative lung apices. Calcified aortic atherosclerosis. Other: Head CT today reported separately. IMPRESSION: 1. No acute traumatic injury identified in the cervical spine. 2. Stable chronic cervical spine degeneration. 3. Aortic Atherosclerosis (ICD10-I70.0). Electronically Signed   By: Genevie Ann M.D.   On: 01/17/2021 05:23   DG Chest Port 1 View  Result Date: 01/17/2021 CLINICAL DATA:  Weakness. EXAM: PORTABLE CHEST 1 VIEW COMPARISON:  11/11/2014 FINDINGS: Heart size and vascularity normal. Mild left lower lobe airspace disease likely atelectasis. Right lung clear. No effusion. IMPRESSION: Mild left lower lobe atelectasis/infiltrate. Electronically Signed   By: Franchot Gallo M.D.   On: 01/17/2021 13:18   ECHOCARDIOGRAM COMPLETE  Result Date: 01/18/2021    ECHOCARDIOGRAM REPORT   Patient Name:   GRANT SWAGER [facility] Date of Exam: 01/18/2021 Medical Rec #:  [id number]          Height:       70.0 in Accession #:    [id number]         Weight:       185.0 lb Date of Birth:  02-May-1941          BSA:          2.019 m Patient Age:    53 years           BP:           129/84 mmHg Patient Gender: M                  HR:           51 bpm. Exam Location:  Inpatient Procedure: 2D Echo Indications:    Congestive Heart Failure  History:        Patient has no prior history of Echocardiogram examinations.                 CAD; Risk Factors:Hypertension.  Sonographer:    Mikki Santee RDCS (AE)  Referring Phys: [place]  1. Left ventricular ejection fraction, by estimation, is 55 to 60%. The left ventricle has normal function. The left ventricle has no regional wall motion abnormalities. There is mild concentric left ventricular hypertrophy. Left ventricular diastolic  parameters are consistent with Grade I diastolic dysfunction (impaired relaxation).  2. Right ventricular systolic function is normal. The right ventricular size is normal.  3. Left atrial size was moderately dilated.  4. Right atrial size was mildly dilated.  5. The mitral valve is grossly normal. Trivial mitral valve regurgitation. No evidence of mitral stenosis. Moderate mitral annular calcification.  6. The aortic valve is tricuspid. There is mild calcification of the aortic valve. Aortic valve regurgitation is mild. No aortic stenosis is present. Comparison(s): No significant change from prior study. Conclusion(s)/Recommendation(s): Otherwise normal echocardiogram, with minor abnormalities described in the report. FINDINGS  Left Ventricle: Left ventricular ejection fraction, by estimation, is 55 to 60%. The left ventricle has normal function. The left ventricle has no regional wall motion abnormalities. The left ventricular internal cavity size was normal in size. There is  mild concentric left ventricular hypertrophy. Left ventricular diastolic parameters are consistent with Grade I diastolic dysfunction (impaired relaxation). Right Ventricle: The right ventricular size is normal. Right vetricular wall thickness was not well visualized. Right ventricular systolic function is normal. Left Atrium: Left atrial size was moderately dilated. Right Atrium: Right atrial size was mildly dilated. Pericardium: There is no evidence of pericardial effusion. Mitral Valve: The mitral valve is grossly normal. Moderate mitral annular calcification. Trivial mitral valve regurgitation. No evidence of mitral valve stenosis.  Tricuspid Valve: The tricuspid valve is grossly normal. Tricuspid valve regurgitation is trivial. Aortic Valve: The aortic valve is tricuspid. There is mild calcification of the aortic valve. Aortic valve regurgitation is mild. Aortic regurgitation PHT measures 880 msec. No aortic stenosis is present. Pulmonic Valve: The pulmonic valve was grossly normal. Pulmonic valve regurgitation is not visualized. No evidence of pulmonic stenosis. Aorta: The aortic root and ascending aorta are structurally normal, with no evidence of dilitation and the aortic arch was not well visualized. Venous: The inferior vena cava was not well visualized. IAS/Shunts: The atrial septum is grossly normal.  LEFT VENTRICLE PLAX 2D LVIDd:         3.70 cm  Diastology LVIDs:         2.65 cm  LV e' medial:    4.24 cm/s LV PW:         1.30 cm  LV E/e' medial:  10.4 LV IVS:        1.30 cm  LV e' lateral:   6.96 cm/s LVOT diam:     2.30 cm  LV E/e' lateral: 6.3 LV SV:         76 LV SV Index:   37 LVOT Area:     4.15 cm  LEFT ATRIUM             Index       RIGHT ATRIUM           Index LA diam:        4.70 cm 2.33 cm/m  RA Area:     18.00 cm LA Vol (A2C):   56.6 ml 28.03 ml/m RA Volume:   44.50 ml  22.04 ml/m LA Vol (A4C):   84.6 ml 41.90 ml/m LA Biplane Vol: 70.2 ml 34.76 ml/m  AORTIC VALVE LVOT Vmax:   70.80 cm/s LVOT Vmean:  52.400 cm/s LVOT VTI:    0.182 m AI PHT:      880 msec  AORTA Ao Root diam: 3.60 cm MITRAL VALVE MV Area (PHT): 2.07 cm    SHUNTS MV Decel Time: 366 msec    Systemic VTI:  0.18 m  MV E velocity: 44.00 cm/s  Systemic Diam: 2.30 cm MV A velocity: 94.00 cm/s MV E/A ratio:  0.47 Buford Dresser MD Electronically signed by Buford Dresser MD Signature Date/Time: 01/18/2021/1:48:28 PM    Final    VAS [place] LOWER EXTREMITY VENOUS (DVT)  Result Date: 01/18/2021  Lower Venous DVT Study Indications: Edema.  Comparison Study: No previous exams Performing Technologist: Rogelia Rohrer  Examination Guidelines: A complete  evaluation includes B-mode imaging, spectral Doppler, color Doppler, and power Doppler as needed of all accessible portions of each vessel. Bilateral testing is considered an integral part of a complete examination. Limited examinations for reoccurring indications may be performed as noted. The reflux portion of the exam is performed with the patient in reverse Trendelenburg.  +---------+---------------+---------+-----------+----------+-------------------+ RIGHT    CompressibilityPhasicitySpontaneityPropertiesThrombus Aging      +---------+---------------+---------+-----------+----------+-------------------+ CFV      Full           Yes      Yes                                      +---------+---------------+---------+-----------+----------+-------------------+ SFJ      Full                                                             +---------+---------------+---------+-----------+----------+-------------------+ FV Prox  Full           Yes      Yes                                      +---------+---------------+---------+-----------+----------+-------------------+ FV Mid   Full           Yes      Yes                                      +---------+---------------+---------+-----------+----------+-------------------+ FV DistalFull           Yes      Yes                                      +---------+---------------+---------+-----------+----------+-------------------+ PFV      Full                                                             +---------+---------------+---------+-----------+----------+-------------------+ POP      Full           Yes      Yes                                      +---------+---------------+---------+-----------+----------+-------------------+ PTV      Full  Not well visualized +---------+---------------+---------+-----------+----------+-------------------+ PERO     Full                                          Not well visualized +---------+---------------+---------+-----------+----------+-------------------+   +---------+---------------+---------+-----------+----------+--------------+ LEFT     CompressibilityPhasicitySpontaneityPropertiesThrombus Aging +---------+---------------+---------+-----------+----------+--------------+ CFV      Full           Yes      Yes                                 +---------+---------------+---------+-----------+----------+--------------+ SFJ      Full                                                        +---------+---------------+---------+-----------+----------+--------------+ FV Prox  Full           Yes      Yes                                 +---------+---------------+---------+-----------+----------+--------------+ FV Mid   Full           Yes      Yes                                 +---------+---------------+---------+-----------+----------+--------------+ FV DistalFull           Yes      Yes                                 +---------+---------------+---------+-----------+----------+--------------+ PFV      Full                                                        +---------+---------------+---------+-----------+----------+--------------+ POP      Full           Yes      Yes                                 +---------+---------------+---------+-----------+----------+--------------+ PTV      Full                                                        +---------+---------------+---------+-----------+----------+--------------+ PERO     Full                                                        +---------+---------------+---------+-----------+----------+--------------+     Summary: BILATERAL: - No  evidence of deep vein thrombosis seen in the lower extremities, bilaterally. - No evidence of superficial venous thrombosis in the lower extremities, bilaterally. -No evidence of popliteal  cyst, bilaterally.   *See table(s) above for measurements and observations. Electronically signed by Servando Snare MD on 01/18/2021 at 1:06:35 PM.    Final     Assessment/Plan  1. Palpitations -  Stated that he had palpitations 3X in 2 weeks, will increase Metoprolol tartrate from 12.5 mg BID to 25 mg BID  2. Primary hypertension -  SBPs ranging from 127 to 155, will continue Amlodipine and Metoprolol increased from 12.5 mg BID to 25 mg BID -  Monitor BPs    Family/ staff Communication:  Discussed plan of care with resident and charge nurse.  Labs/tests ordered:  None  Goals of care:   Short-term care   Durenda Age, DNP, MSN, FNP-BC [facility] [phone] (Monday-Friday 8:00 a.m. - 5:00 p.m.) [phone] (after hours)

## 2021-02-06 ENCOUNTER — Non-Acute Institutional Stay (SKILLED_NURSING_FACILITY): Payer: Medicare Other | Admitting: Adult Health

## 2021-02-06 ENCOUNTER — Encounter: Payer: Self-pay | Admitting: Adult Health

## 2021-02-06 ENCOUNTER — Other Ambulatory Visit: Payer: Self-pay | Admitting: Adult Health

## 2021-02-06 DIAGNOSIS — N179 Acute kidney failure, unspecified: Secondary | ICD-10-CM

## 2021-02-06 DIAGNOSIS — I10 Essential (primary) hypertension: Secondary | ICD-10-CM | POA: Diagnosis not present

## 2021-02-06 DIAGNOSIS — R002 Palpitations: Secondary | ICD-10-CM | POA: Diagnosis not present

## 2021-02-06 DIAGNOSIS — R55 Syncope and collapse: Secondary | ICD-10-CM | POA: Diagnosis not present

## 2021-02-06 MED ORDER — METOPROLOL TARTRATE 25 MG PO TABS
25.0000 mg | ORAL_TABLET | Freq: Two times a day (BID) | ORAL | 0 refills | Status: DC
Start: 2021-02-06 — End: 2021-02-21

## 2021-02-06 MED ORDER — AMLODIPINE BESYLATE 10 MG PO TABS: 10.0000 mg | ORAL_TABLET | Freq: Every day | ORAL | 0 refills | Status: DC

## 2021-02-06 MED ORDER — CALCIUM CARBONATE ANTACID 750 MG PO CHEW: 1.0000 | CHEWABLE_TABLET | Freq: Three times a day (TID) | ORAL | 0 refills | Status: AC

## 2021-02-06 MED ORDER — ASPIRIN 81 MG PO CHEW: 81.0000 mg | CHEWABLE_TABLET | Freq: Every day | ORAL | 0 refills | Status: AC

## 2021-02-06 NOTE — Progress Notes (Signed)
Location:  [facility] Room Number: 108/A Place of Service:  SNF (31) Provider:  Durenda Age, DNP, FNP-BC  Patient Care Team: [name], NP as PCP - General (Family Medicine) [name], MD as PCP - Cardiology (Cardiology)  Extended Emergency Contact Information Primary Emergency Contact: [place], [place] [address] [name] of [place] Phone: [phone] Relation: Son Secondary Emergency Contact: Ch[place], [place] Mobile Phone: [phone] Relation: Son  Code Status:  Full Code  Goals of care: Advanced Directive information Advanced Directives 02/06/2021  Does Patient Have a Medical Advance Directive? Yes  Type of Paramedic of [place];Living will  Does patient want to make changes to medical advance directive? No - Patient declined  Copy of [place] in Chart? Yes - validated most recent copy scanned in chart (See row information)  Would patient like information on creating a medical advance directive? -     Chief Complaint  Patient presents with   Discharge Note    Discharge.     HPI:  Pt is a 80 y.o. male who is for discharge home with Home health PT and OT.  He was admitted to [facility] on 01/23/21 post hospitalization 01/17/21 to 01/23/21. He has a PMH of CAD and arthritis. He had a fall at home, while walking with his walker, hit his head but did not lose consciousness. CT of the head and cervical spine were unremarkable. Creatinine was 1.6 from 0.9 in March 2021, BNP was 261. He was given 1 dose of Lasix in the ED due to lower extremity edema. He was also found to be bradycardic. Cardiology was consulted and recommended to resume Metoprolol at the lower dose if needed.  At [facility], he complained of 3 episodes of palpitations for the past 2 weeks.  Metoprolol tartrate was increased back to 25 mg BID.  He requested to be discharged  home.   Past Medical History:  Diagnosis Date   Anxiety    Arthritis    Coronary artery disease    have an appointment with Dr Johnsie Cancel 11/15/14   Erectile dysfunction    Fall 01/17/2021   Frozen shoulder    GERD (gastroesophageal reflux disease)    Guillain Barr syndrome ([place]) 2009   Following the H1 N1 flu vaccine   History of elevated lipids    Hypertension    Osteoarthritis    Spinal stenosis 11/24/2014   Past Surgical History:  Procedure Laterality Date   CARDIAC SURGERY     2 stents placed    CORONARY ANGIOPLASTY     mid and proximal LAD stent 09/1999 [facility])   posterior lumbar fusion L4-5  11-24-14    No Known Allergies  Outpatient Encounter Medications as of 02/06/2021  Medication Sig   calcium carbonate (TUMS EX) 750 MG chewable tablet Chew 1 tablet (750 mg total) by mouth 3 (three) times daily.   metoprolol tartrate (LOPRESSOR) 25 MG tablet Take 1 tablet (25 mg total) by mouth 2 (two) times daily.   [DISCONTINUED] amLODipine (NORVASC) 10 MG tablet Take 1 tablet (10 mg total) by mouth daily.   [DISCONTINUED] aspirin 81 MG chewable tablet Chew 81 mg by mouth daily.   [DISCONTINUED] calcium carbonate (TUMS - DOSED IN MG ELEMENTAL CALCIUM) 500 MG chewable tablet Chew 2 tablets by mouth 3 (three) times daily.   [DISCONTINUED] calcium carbonate (TUMS EX) 750 MG chewable tablet Chew  1 tablet by mouth daily.   [DISCONTINUED] metoprolol tartrate (LOPRESSOR) 25 MG tablet Take 0.5 tablets (12.5 mg total) by mouth 2 (two) times daily.   [DISCONTINUED] metoprolol tartrate (LOPRESSOR) 25 MG tablet Take 25 mg by mouth 2 (two) times daily.   amLODipine (NORVASC) 10 MG tablet Take 1 tablet (10 mg total) by mouth daily.   aspirin 81 MG chewable tablet Chew 1 tablet (81 mg total) by mouth daily.   No facility-administered encounter medications on file as of 02/06/2021.    Review of Systems  GENERAL: No change in appetite, no fatigue, no weight  changes, no fever, chills or weakness MOUTH and THROAT: Denies oral discomfort, gingival pain or bleeding RESPIRATORY: no cough, SOB, DOE, wheezing, hemoptysis CARDIAC: No chest pain, edema or palpitations GI: No abdominal pain, diarrhea, constipation, heart burn, nausea or vomiting GU: Denies dysuria, frequency, hematuria, incontinence, or discharge NEUROLOGICAL: Denies dizziness, syncope, numbness, or headache PSYCHIATRIC: Denies feelings of depression or anxiety. No report of hallucinations, insomnia, paranoia, or agitation    Immunization History  Administered Date(s) Administered   Moderna Sars-Covid-2 Vaccination 01/28/2020, 02/12/2020   Tdap 04/16/2020   Pertinent  Health Maintenance Due  Topic Date Due   PNA vac Low Risk Adult (1 of 2 - PCV13) Never done   INFLUENZA VACCINE  Never done   Fall Risk  03/05/2017 08/07/2015  Falls in the past year? No No     Vitals:   02/06/21 1152  BP: 112/81  Pulse: 94  Resp: 18  Temp: (!) 97.4 F (36.3 C)  Weight: 158 lb 3.2 oz (71.8 kg)   Body mass index is 24.78 kg/m.  Physical Exam  GENERAL APPEARANCE: Well nourished. In no acute distress. Normal body habitus SKIN:  Skin is warm and dry.  MOUTH and THROAT: Lips are without lesions. Oral mucosa is moist and without lesions. Tongue is normal in shape, size, and color and without lesions RESPIRATORY: Breathing is even & unlabored, BS CTAB CARDIAC: RRR, no murmur,no extra heart sounds, no edema GI: Abdomen soft, normal BS, no masses, no tenderness EXTREMITIES:  Able to move X 4 extremities NEUROLOGICAL: There is no tremor. Speech is clear. Alert and oriented X 3. PSYCHIATRIC:  Affect and behavior are appropriate  Labs reviewed: Recent Labs    01/18/21 0421 01/19/21 0316 01/20/21 0436  NA 138 134* 134*  K 4.0 3.6 3.6  CL 103 103 104  CO2 22 20* 22  GLUCOSE 88 79 103*  BUN 30* 26* 18  CREATININE 1.74* 1.35* 1.19  CALCIUM 9.2 8.5* 8.4*  MG  --  1.9 1.6*   Recent  Labs    01/18/21 0421 01/19/21 0316  AST 26 21  ALT 15 13  ALKPHOS 39 36*  BILITOT 1.4* 1.0  PROT 5.8* 5.6*  ALBUMIN 3.3* 3.0*   Recent Labs    01/17/21 0856 01/18/21 0421 01/19/21 0316 01/20/21 0436  WBC 9.1 7.3 4.8 6.1  NEUTROABS 7.7  --  3.4 4.7  HGB 15.4 14.8 13.8 14.4  HCT 45.7 44.2 41.8 39.5  MCV 97.4 97.4 98.6 93.8  PLT 145* 134* 122* 137*   Lab Results  Component Value Date   TSH 3.418 01/18/2021   Lab Results  Component Value Date   HGBA1C 5.6 04/16/2018   Lab Results  Component Value Date   CHOL 179 04/16/2018   HDL 52.50 04/16/2018   LDLCALC 114 (H) 04/16/2018   TRIG 63.0 04/16/2018   CHOLHDL 3 04/16/2018    Significant Diagnostic  Results in last 30 days:  CT Head Wo Contrast  Result Date: 01/17/2021 CLINICAL DATA:  80 year old male status post fall at home. Facial trauma. EXAM: CT HEAD WITHOUT CONTRAST TECHNIQUE: Contiguous axial images were obtained from the base of the skull through the vertex without intravenous contrast. COMPARISON:  Head CT 04/16/2020. FINDINGS: Brain: Stable cerebral volume from last year. No midline shift, ventriculomegaly, mass effect, evidence of mass lesion, intracranial hemorrhage or evidence of cortically based acute infarction. Stable gray-white matter differentiation throughout the brain. Patchy left greater than right hemisphere white matter hypodensity, with chronic involvement of the left basal ganglia. Moderate heterogeneity in the pons appears stable. And better demonstrated on the sagittal image a chronic discrete pontine lacunar infarct is stable. No cortical encephalomalacia identified. Vascular: Extensive Calcified atherosclerosis at the skull base. No suspicious intracranial vascular hyperdensity. Skull: No acute osseous abnormality identified. Sinuses/Orbits: Visualized paranasal sinuses and mastoids are stable and well pneumatized. Other: No acute orbit or scalp soft tissue injury identified. Negative visible face soft  tissues. IMPRESSION: 1. No acute traumatic injury identified. 2. Chronic small vessel disease appears stable by CT from last year. Electronically Signed   By: Genevie Ann M.D.   On: 01/17/2021 05:17   CT Cervical Spine Wo Contrast  Result Date: 01/17/2021 CLINICAL DATA:  80 year old male status post fall at home. Facial trauma. EXAM: CT CERVICAL SPINE WITHOUT CONTRAST TECHNIQUE: Multidetector CT imaging of the cervical spine was performed without intravenous contrast. Multiplanar CT image reconstructions were also generated. COMPARISON:  Head CT today reported separately. Cervical spine CT 04/16/2020. FINDINGS: Alignment: Straightening of cervical lordosis today, less reversal of lordosis compared to last year. Cervicothoracic junction alignment is within normal limits. Bilateral posterior element alignment is within normal limits. Skull base and vertebrae: Visualized skull base is intact. No atlanto-occipital dissociation. C1 and C2 remain normally aligned. C2 vertebra appears stable and intact, with prominent nutrient foramina noted at the junction of the body and pedicles (on the left series 9, image 19). No acute osseous abnormality identified. Soft tissues and spinal canal: No prevertebral fluid or swelling. No visible canal hematoma. Retropharyngeal course of the carotids in the neck with calcified atherosclerosis. Disc levels: Stable chronic cervical disc and endplate degeneration, prominent right upper cervical spine facet arthropathy. Upper chest: Visible upper thoracic levels appear grossly intact. Negative lung apices. Calcified aortic atherosclerosis. Other: Head CT today reported separately. IMPRESSION: 1. No acute traumatic injury identified in the cervical spine. 2. Stable chronic cervical spine degeneration. 3. Aortic Atherosclerosis (ICD10-I70.0). Electronically Signed   By: Genevie Ann M.D.   On: 01/17/2021 05:23   DG Chest Port 1 View  Result Date: 01/17/2021 CLINICAL DATA:  Weakness. EXAM: PORTABLE  CHEST 1 VIEW COMPARISON:  11/11/2014 FINDINGS: Heart size and vascularity normal. Mild left lower lobe airspace disease likely atelectasis. Right lung clear. No effusion. IMPRESSION: Mild left lower lobe atelectasis/infiltrate. Electronically Signed   By: Franchot Gallo M.D.   On: 01/17/2021 13:18   ECHOCARDIOGRAM COMPLETE  Result Date: 01/18/2021    ECHOCARDIOGRAM REPORT   Patient Name:   Clinton [name] [facility] Date of Exam: 01/18/2021 Medical Rec #:  [id number]          Height:       70.0 in Accession #:    [id number]         Weight:       185.0 lb Date of Birth:  09-Mar-1941          BSA:  2.019 m Patient Age:    72 years           BP:           129/84 mmHg Patient Gender: M                  HR:           51 bpm. Exam Location:  Inpatient Procedure: 2D Echo Indications:    Congestive Heart Failure  History:        Patient has no prior history of Echocardiogram examinations.                 CAD; Risk Factors:Hypertension.  Sonographer:    Mikki Santee RDCS (AE) Referring Phys: Ali Molina  1. Left ventricular ejection fraction, by estimation, is 55 to 60%. The left ventricle has normal function. The left ventricle has no regional wall motion abnormalities. There is mild concentric left ventricular hypertrophy. Left ventricular diastolic parameters are consistent with Grade I diastolic dysfunction (impaired relaxation).  2. Right ventricular systolic function is normal. The right ventricular size is normal.  3. Left atrial size was moderately dilated.  4. Right atrial size was mildly dilated.  5. The mitral valve is grossly normal. Trivial mitral valve regurgitation. No evidence of mitral stenosis. Moderate mitral annular calcification.  6. The aortic valve is tricuspid. There is mild calcification of the aortic valve. Aortic valve regurgitation is mild. No aortic stenosis is present. Comparison(s): No significant change from prior study. Conclusion(s)/Recommendation(s): Otherwise  normal echocardiogram, with minor abnormalities described in the report. FINDINGS  Left Ventricle: Left ventricular ejection fraction, by estimation, is 55 to 60%. The left ventricle has normal function. The left ventricle has no regional wall motion abnormalities. The left ventricular internal cavity size was normal in size. There is  mild concentric left ventricular hypertrophy. Left ventricular diastolic parameters are consistent with Grade I diastolic dysfunction (impaired relaxation). Right Ventricle: The right ventricular size is normal. Right vetricular wall thickness was not well visualized. Right ventricular systolic function is normal. Left Atrium: Left atrial size was moderately dilated. Right Atrium: Right atrial size was mildly dilated. Pericardium: There is no evidence of pericardial effusion. Mitral Valve: The mitral valve is grossly normal. Moderate mitral annular calcification. Trivial mitral valve regurgitation. No evidence of mitral valve stenosis. Tricuspid Valve: The tricuspid valve is grossly normal. Tricuspid valve regurgitation is trivial. Aortic Valve: The aortic valve is tricuspid. There is mild calcification of the aortic valve. Aortic valve regurgitation is mild. Aortic regurgitation PHT measures 880 msec. No aortic stenosis is present. Pulmonic Valve: The pulmonic valve was grossly normal. Pulmonic valve regurgitation is not visualized. No evidence of pulmonic stenosis. Aorta: The aortic root and ascending aorta are structurally normal, with no evidence of dilitation and the aortic arch was not well visualized. Venous: The inferior vena cava was not well visualized. IAS/Shunts: The atrial septum is grossly normal.  LEFT VENTRICLE PLAX 2D LVIDd:         3.70 cm  Diastology LVIDs:         2.65 cm  LV e' medial:    4.24 cm/s LV PW:         1.30 cm  LV E/e' medial:  10.4 LV IVS:        1.30 cm  LV e' lateral:   6.96 cm/s LVOT diam:     2.30 cm  LV E/e' lateral: 6.3 LV SV:  76 LV SV  Index:   37 LVOT Area:     4.15 cm  LEFT ATRIUM             Index       RIGHT ATRIUM           Index LA diam:        4.70 cm 2.33 cm/m  RA Area:     18.00 cm LA Vol (A2C):   56.6 ml 28.03 ml/m RA Volume:   44.50 ml  22.04 ml/m LA Vol (A4C):   84.6 ml 41.90 ml/m LA Biplane Vol: 70.2 ml 34.76 ml/m  AORTIC VALVE LVOT Vmax:   70.80 cm/s LVOT Vmean:  52.400 cm/s LVOT VTI:    0.182 m AI PHT:      880 msec  AORTA Ao Root diam: 3.60 cm MITRAL VALVE MV Area (PHT): 2.07 cm    SHUNTS MV Decel Time: 366 msec    Systemic VTI:  0.18 m MV E velocity: 44.00 cm/s  Systemic Diam: 2.30 cm MV A velocity: 94.00 cm/s MV E/A ratio:  0.47 Buford Dresser MD Electronically signed by Buford Dresser MD Signature Date/Time: 01/18/2021/1:48:28 PM    Final    VAS [place] LOWER EXTREMITY VENOUS (DVT)  Result Date: 01/18/2021  Lower Venous DVT Study Indications: Edema.  Comparison Study: No previous exams Performing Technologist: Rogelia Rohrer  Examination Guidelines: A complete evaluation includes B-mode imaging, spectral Doppler, color Doppler, and power Doppler as needed of all accessible portions of each vessel. Bilateral testing is considered an integral part of a complete examination. Limited examinations for reoccurring indications may be performed as noted. The reflux portion of the exam is performed with the patient in reverse Trendelenburg.  +---------+---------------+---------+-----------+----------+-------------------+  RIGHT     Compressibility Phasicity Spontaneity Properties Thrombus Aging       +---------+---------------+---------+-----------+----------+-------------------+  CFV       Full            Yes       Yes                                         +---------+---------------+---------+-----------+----------+-------------------+  SFJ       Full                                                                  +---------+---------------+---------+-----------+----------+-------------------+  FV Prox   Full             Yes       Yes                                         +---------+---------------+---------+-----------+----------+-------------------+  FV Mid    Full            Yes       Yes                                         +---------+---------------+---------+-----------+----------+-------------------+  FV Distal Full  Yes       Yes                                         +---------+---------------+---------+-----------+----------+-------------------+  PFV       Full                                                                  +---------+---------------+---------+-----------+----------+-------------------+  POP       Full            Yes       Yes                                         +---------+---------------+---------+-----------+----------+-------------------+  PTV       Full                                             Not well visualized  +---------+---------------+---------+-----------+----------+-------------------+  PERO      Full                                             Not well visualized  +---------+---------------+---------+-----------+----------+-------------------+   +---------+---------------+---------+-----------+----------+--------------+  LEFT      Compressibility Phasicity Spontaneity Properties Thrombus Aging  +---------+---------------+---------+-----------+----------+--------------+  CFV       Full            Yes       Yes                                    +---------+---------------+---------+-----------+----------+--------------+  SFJ       Full                                                             +---------+---------------+---------+-----------+----------+--------------+  FV Prox   Full            Yes       Yes                                    +---------+---------------+---------+-----------+----------+--------------+  FV Mid    Full            Yes       Yes                                    +---------+---------------+---------+-----------+----------+--------------+  FV  Distal Full            Yes  Yes                                    +---------+---------------+---------+-----------+----------+--------------+  PFV       Full                                                             +---------+---------------+---------+-----------+----------+--------------+  POP       Full            Yes       Yes                                    +---------+---------------+---------+-----------+----------+--------------+  PTV       Full                                                             +---------+---------------+---------+-----------+----------+--------------+  PERO      Full                                                             +---------+---------------+---------+-----------+----------+--------------+     Summary: BILATERAL: - No evidence of deep vein thrombosis seen in the lower extremities, bilaterally. - No evidence of superficial venous thrombosis in the lower extremities, bilaterally. -No evidence of popliteal cyst, bilaterally.   *See table(s) above for measurements and observations. Electronically signed by Servando Snare MD on 01/18/2021 at 1:06:35 PM.    Final     Assessment/Plan  1. Near syncope -  S/P fall, CT of head and cervical spine were unremarkable -   Fall was thought to be due to bradycardia  2. Palpitations -Metoprolol titrate twice increased from 12.5 mg twice a day to 25 mg twice a day  - metoprolol tartrate (LOPRESSOR) 25 MG tablet; Take 1 tablet (25 mg total) by mouth 2 (two) times daily.  Dispense: 60 tablet; Refill: 0 - aspirin 81 MG chewable tablet; Chew 1 tablet (81 mg total) by mouth daily.  Dispense: 30 tablet; Refill: 0  3. Primary hypertension - metoprolol tartrate (LOPRESSOR) 25 MG tablet; Take 1 tablet (25 mg total) by mouth 2 (two) times daily.  Dispense: 60 tablet; Refill: 0 - amLODipine (NORVASC) 10 MG tablet; Take 1 tablet (10 mg total) by mouth daily.  Dispense: 30 tablet; Refill: 0  4. AKI (acute kidney injury)  [facility]) Lab Results  Component Value Date   CREATININE 1.19 01/20/2021   -  Creatinine 1/09, 2/24    I have filled out patient's discharge paperwork and e-prescribed medications.  Patient will receive home health PT and OT.  DME provided: Wheelchair and 3 in 1  Wheelchair -   patient had a fall at home due to a syncopal episode which impairs his ability to perform daily activities like  toileting, feeding, dressing, grooming and bathing in the home.  A cane or walker will not resolve the issue with performing activities of daily living.  A wheelchair will allow patient to safely perform daily activities.  Patient can safely propel the wheelchair in the home and will have a caregiver who can provide assistance.   Total discharge time: Greater than 30 minutes Greater than 50% was spent in counseling and coordination of care.   Discharge time involved coordination of the discharge process with social worker, nursing staff and therapy department. Medical justification for home health services/DME verified.    Durenda Age, DNP, MSN, FNP-BC [facility] [phone] (Monday-Friday 8:00 a.m. - 5:00 p.m.) [phone] (after hours)

## 2021-02-07 DIAGNOSIS — I5022 Chronic systolic (congestive) heart failure: Secondary | ICD-10-CM | POA: Diagnosis not present

## 2021-02-07 DIAGNOSIS — R262 Difficulty in walking, not elsewhere classified: Secondary | ICD-10-CM | POA: Diagnosis not present

## 2021-02-08 ENCOUNTER — Telehealth: Payer: Self-pay | Admitting: Adult Health

## 2021-02-08 NOTE — Telephone Encounter (Signed)
Verbal orders ok

## 2021-02-08 NOTE — Telephone Encounter (Signed)
Spoke with Lavella Lemons and informed her of the approval for orders as below.

## 2021-02-08 NOTE — Telephone Encounter (Signed)
Clinton [name] with [name] calling stating patient is being discharged from hospital and need verbal order from PCP to do evaluation for PT and OT. Please call [phone].

## 2021-02-10 ENCOUNTER — Emergency Department (HOSPITAL_COMMUNITY): Payer: Medicare Other

## 2021-02-10 ENCOUNTER — Encounter (HOSPITAL_COMMUNITY): Payer: Self-pay | Admitting: Emergency Medicine

## 2021-02-10 ENCOUNTER — Other Ambulatory Visit: Payer: Self-pay

## 2021-02-10 ENCOUNTER — Inpatient Hospital Stay (HOSPITAL_COMMUNITY)
Admission: EM | Admit: 2021-02-10 | Discharge: 2021-02-21 | DRG: 871 | Disposition: A | Discharge status: Home / Self Care | Payer: Medicare Other | Attending: Internal Medicine | Admitting: Internal Medicine

## 2021-02-10 DIAGNOSIS — I2510 Atherosclerotic heart disease of native coronary artery without angina pectoris: Secondary | ICD-10-CM | POA: Diagnosis present

## 2021-02-10 DIAGNOSIS — E872 Acidosis: Secondary | ICD-10-CM | POA: Diagnosis not present

## 2021-02-10 DIAGNOSIS — Z79899 Other long term (current) drug therapy: Secondary | ICD-10-CM

## 2021-02-10 DIAGNOSIS — Z981 Arthrodesis status: Secondary | ICD-10-CM | POA: Diagnosis not present

## 2021-02-10 DIAGNOSIS — R6520 Severe sepsis without septic shock: Secondary | ICD-10-CM | POA: Diagnosis present

## 2021-02-10 DIAGNOSIS — N390 Urinary tract infection, site not specified: Secondary | ICD-10-CM | POA: Diagnosis present

## 2021-02-10 DIAGNOSIS — Z6822 Body mass index (BMI) 22.0-22.9, adult: Secondary | ICD-10-CM | POA: Diagnosis not present

## 2021-02-10 DIAGNOSIS — R532 Functional quadriplegia: Secondary | ICD-10-CM | POA: Diagnosis present

## 2021-02-10 DIAGNOSIS — Z87891 Personal history of nicotine dependence: Secondary | ICD-10-CM

## 2021-02-10 DIAGNOSIS — I959 Hypotension, unspecified: Secondary | ICD-10-CM

## 2021-02-10 DIAGNOSIS — N1330 Unspecified hydronephrosis: Secondary | ICD-10-CM | POA: Diagnosis not present

## 2021-02-10 DIAGNOSIS — L89159 Pressure ulcer of sacral region, unspecified stage: Secondary | ICD-10-CM | POA: Diagnosis present

## 2021-02-10 DIAGNOSIS — R0902 Hypoxemia: Secondary | ICD-10-CM | POA: Diagnosis present

## 2021-02-10 DIAGNOSIS — N170 Acute kidney failure with tubular necrosis: Secondary | ICD-10-CM | POA: Diagnosis not present

## 2021-02-10 DIAGNOSIS — A4151 Sepsis due to Escherichia coli [E. coli]: Secondary | ICD-10-CM | POA: Diagnosis not present

## 2021-02-10 DIAGNOSIS — R7881 Bacteremia: Secondary | ICD-10-CM | POA: Diagnosis not present

## 2021-02-10 DIAGNOSIS — R1310 Dysphagia, unspecified: Secondary | ICD-10-CM | POA: Diagnosis present

## 2021-02-10 DIAGNOSIS — D6959 Other secondary thrombocytopenia: Secondary | ICD-10-CM | POA: Diagnosis not present

## 2021-02-10 DIAGNOSIS — R338 Other retention of urine: Secondary | ICD-10-CM | POA: Diagnosis not present

## 2021-02-10 DIAGNOSIS — A419 Sepsis, unspecified organism: Secondary | ICD-10-CM | POA: Diagnosis not present

## 2021-02-10 DIAGNOSIS — R5381 Other malaise: Secondary | ICD-10-CM | POA: Diagnosis not present

## 2021-02-10 DIAGNOSIS — E860 Dehydration: Secondary | ICD-10-CM | POA: Diagnosis present

## 2021-02-10 DIAGNOSIS — K920 Hematemesis: Secondary | ICD-10-CM | POA: Diagnosis not present

## 2021-02-10 DIAGNOSIS — Z955 Presence of coronary angioplasty implant and graft: Secondary | ICD-10-CM

## 2021-02-10 DIAGNOSIS — N179 Acute kidney failure, unspecified: Secondary | ICD-10-CM

## 2021-02-10 DIAGNOSIS — I10 Essential (primary) hypertension: Secondary | ICD-10-CM | POA: Diagnosis present

## 2021-02-10 DIAGNOSIS — Z7189 Other specified counseling: Secondary | ICD-10-CM | POA: Diagnosis not present

## 2021-02-10 DIAGNOSIS — Z515 Encounter for palliative care: Secondary | ICD-10-CM

## 2021-02-10 DIAGNOSIS — Z7982 Long term (current) use of aspirin: Secondary | ICD-10-CM

## 2021-02-10 DIAGNOSIS — N136 Pyonephrosis: Secondary | ICD-10-CM | POA: Diagnosis not present

## 2021-02-10 DIAGNOSIS — E43 Unspecified severe protein-calorie malnutrition: Secondary | ICD-10-CM | POA: Insufficient documentation

## 2021-02-10 DIAGNOSIS — N3289 Other specified disorders of bladder: Secondary | ICD-10-CM | POA: Diagnosis not present

## 2021-02-10 DIAGNOSIS — J690 Pneumonitis due to inhalation of food and vomit: Secondary | ICD-10-CM | POA: Diagnosis not present

## 2021-02-10 DIAGNOSIS — B9620 Unspecified Escherichia coli [E. coli] as the cause of diseases classified elsewhere: Secondary | ICD-10-CM | POA: Diagnosis not present

## 2021-02-10 DIAGNOSIS — D696 Thrombocytopenia, unspecified: Secondary | ICD-10-CM | POA: Diagnosis not present

## 2021-02-10 DIAGNOSIS — Z20822 Contact with and (suspected) exposure to covid-19: Secondary | ICD-10-CM | POA: Diagnosis present

## 2021-02-10 DIAGNOSIS — Z8249 Family history of ischemic heart disease and other diseases of the circulatory system: Secondary | ICD-10-CM | POA: Diagnosis not present

## 2021-02-10 DIAGNOSIS — K219 Gastro-esophageal reflux disease without esophagitis: Secondary | ICD-10-CM | POA: Diagnosis present

## 2021-02-10 DIAGNOSIS — E876 Hypokalemia: Secondary | ICD-10-CM | POA: Diagnosis not present

## 2021-02-10 DIAGNOSIS — E782 Mixed hyperlipidemia: Secondary | ICD-10-CM | POA: Diagnosis not present

## 2021-02-10 DIAGNOSIS — E785 Hyperlipidemia, unspecified: Secondary | ICD-10-CM | POA: Diagnosis present

## 2021-02-10 DIAGNOSIS — K5730 Diverticulosis of large intestine without perforation or abscess without bleeding: Secondary | ICD-10-CM | POA: Diagnosis not present

## 2021-02-10 LAB — CBC
HCT: 46.2 % (ref 39.0–52.0)
HCT: 47.3 % (ref 39.0–52.0)
Hemoglobin: 14.8 g/dL (ref 13.0–17.0)
Hemoglobin: 15.5 g/dL (ref 13.0–17.0)
MCH: 31.8 pg (ref 26.0–34.0)
MCH: 31.8 pg (ref 26.0–34.0)
MCHC: 32 g/dL (ref 30.0–36.0)
MCHC: 32.8 g/dL (ref 30.0–36.0)
MCV: 99.1 fL (ref 80.0–100.0)
MCV: 97.1 fL (ref 80.0–100.0)
Platelets: 184 10*3/uL (ref 150–400)
Platelets: 150 10*3/uL (ref 150–400)
RBC: 4.66 MIL/uL (ref 4.22–5.81)
RBC: 4.87 MIL/uL (ref 4.22–5.81)
RDW: 13 % (ref 11.5–15.5)
RDW: 12.9 % (ref 11.5–15.5)
WBC: 11.4 10*3/uL — ABNORMAL HIGH (ref 4.0–10.5)
WBC: 8.8 10*3/uL (ref 4.0–10.5)
nRBC: 0 % (ref 0.0–0.2)
nRBC: 0 % (ref 0.0–0.2)

## 2021-02-10 LAB — URINALYSIS, ROUTINE W REFLEX MICROSCOPIC
Bilirubin Urine: NEGATIVE
Glucose, UA: NEGATIVE mg/dL
Ketones, ur: NEGATIVE mg/dL
Nitrite: NEGATIVE
Protein, ur: 100 mg/dL — AB
Specific Gravity, Urine: 1.01 (ref 1.005–1.030)
WBC, UA: >50 WBC/hpf — ABNORMAL HIGH (ref 0–5)
pH: 6 (ref 5.0–8.0)

## 2021-02-10 LAB — CREATININE, SERUM
Creatinine, Ser: 2.75 mg/dL — ABNORMAL HIGH (ref 0.61–1.24)
GFR, Estimated: 23 mL/min — ABNORMAL LOW (ref >60)

## 2021-02-10 LAB — TYPE AND SCREEN
ABO/RH(D): O POS
Antibody Screen: NEGATIVE

## 2021-02-10 LAB — COMPREHENSIVE METABOLIC PANEL
ALT: 37 U/L (ref 0–44)
AST: 27 U/L (ref 15–41)
Albumin: 2.9 g/dL — ABNORMAL LOW (ref 3.5–5.0)
Alkaline Phosphatase: 108 U/L (ref 38–126)
Anion gap: 17 — ABNORMAL HIGH (ref 5–15)
BUN: 58 mg/dL — ABNORMAL HIGH (ref 8–23)
CO2: 18 mmol/L — ABNORMAL LOW (ref 22–32)
Calcium: 9.8 mg/dL (ref 8.9–10.3)
Chloride: 102 mmol/L (ref 98–111)
Creatinine, Ser: 2.71 mg/dL — ABNORMAL HIGH (ref 0.61–1.24)
GFR, Estimated: 23 mL/min — ABNORMAL LOW (ref >60)
Glucose, Bld: 98 mg/dL (ref 70–99)
Potassium: 3.6 mmol/L (ref 3.5–5.1)
Sodium: 137 mmol/L (ref 135–145)
Total Bilirubin: 1.3 mg/dL — ABNORMAL HIGH (ref 0.3–1.2)
Total Protein: 6.4 g/dL — ABNORMAL LOW (ref 6.5–8.1)

## 2021-02-10 LAB — PROTIME-INR
INR: 1.1 (ref 0.8–1.2)
Prothrombin Time: 14 seconds (ref 11.4–15.2)

## 2021-02-10 LAB — RESP PANEL BY RT-PCR (FLU A&B, COVID) ARPGX2
Influenza A by PCR: NEGATIVE
Influenza B by PCR: NEGATIVE
SARS Coronavirus 2 by RT PCR: NEGATIVE

## 2021-02-10 LAB — BRAIN NATRIURETIC PEPTIDE: B Natriuretic Peptide: 437.2 pg/mL — ABNORMAL HIGH (ref 0.0–100.0)

## 2021-02-10 LAB — LACTIC ACID, PLASMA
Lactic Acid, Venous: 2.9 mmol/L (ref 0.5–1.9)
Lactic Acid, Venous: 3 mmol/L (ref 0.5–1.9)

## 2021-02-10 MED ORDER — PIPERACILLIN-TAZOBACTAM 3.375 G IVPB 30 MIN
3.3750 g | Freq: Once | INTRAVENOUS | Status: AC
  Administered 2021-02-10: 3.375 g via INTRAVENOUS
  Filled 2021-02-10: qty 50

## 2021-02-10 MED ORDER — ONDANSETRON HCL 4 MG/2ML IJ SOLN: 4.0000 mg | Freq: Four times a day (QID) | INTRAMUSCULAR | Status: DC | PRN

## 2021-02-10 MED ORDER — NEPRO/CARBSTEADY PO LIQD
237.0000 mL | Freq: Three times a day (TID) | ORAL | Status: DC | PRN
  Filled 2021-02-10: qty 237

## 2021-02-10 MED ORDER — ONDANSETRON HCL 4 MG PO TABS: 4.0000 mg | ORAL_TABLET | Freq: Four times a day (QID) | ORAL | Status: DC | PRN

## 2021-02-10 MED ORDER — PANTOPRAZOLE SODIUM 40 MG IV SOLR
40.0000 mg | Freq: Once | INTRAVENOUS | Status: AC
  Administered 2021-02-10: 40 mg via INTRAVENOUS
  Filled 2021-02-10: qty 40

## 2021-02-10 MED ORDER — HEPARIN SODIUM (PORCINE) 5000 UNIT/ML IJ SOLN
5000.0000 [IU] | Freq: Three times a day (TID) | INTRAMUSCULAR | Status: DC
  Administered 2021-02-10 – 2021-02-21 (×30): 5000 [IU] via SUBCUTANEOUS
  Filled 2021-02-10 (×31): qty 1

## 2021-02-10 MED ORDER — SORBITOL 70 % SOLN
30.0000 mL | Status: DC | PRN
  Filled 2021-02-10: qty 30

## 2021-02-10 MED ORDER — SODIUM CHLORIDE 0.9 % IV SOLN
1.0000 g | INTRAVENOUS | Status: DC
  Administered 2021-02-10: 1 g via INTRAVENOUS
  Filled 2021-02-10 (×2): qty 10

## 2021-02-10 MED ORDER — CAMPHOR-MENTHOL 0.5-0.5 % EX LOTN
1.0000 "application " | TOPICAL_LOTION | Freq: Three times a day (TID) | CUTANEOUS | Status: DC | PRN
  Filled 2021-02-10: qty 222

## 2021-02-10 MED ORDER — STERILE WATER FOR INJECTION IV SOLN
INTRAVENOUS | Status: DC
  Filled 2021-02-10 (×9): qty 850

## 2021-02-10 MED ORDER — DOCUSATE SODIUM 283 MG RE ENEM
1.0000 | ENEMA | RECTAL | Status: DC | PRN
  Filled 2021-02-10: qty 1

## 2021-02-10 MED ORDER — LACTATED RINGERS IV BOLUS
1000.0000 mL | Freq: Once | INTRAVENOUS | Status: AC
  Administered 2021-02-10: 1000 mL via INTRAVENOUS

## 2021-02-10 MED ORDER — PANTOPRAZOLE SODIUM 40 MG IV SOLR
40.0000 mg | Freq: Two times a day (BID) | INTRAVENOUS | Status: DC
  Administered 2021-02-10 – 2021-02-12 (×4): 40 mg via INTRAVENOUS
  Filled 2021-02-10 (×4): qty 40

## 2021-02-10 MED ORDER — CALCIUM CARBONATE ANTACID 1250 MG/5ML PO SUSP
500.0000 mg | Freq: Four times a day (QID) | ORAL | Status: DC | PRN
  Filled 2021-02-10 (×2): qty 5

## 2021-02-10 MED ORDER — HYDROXYZINE HCL 25 MG PO TABS: 25.0000 mg | ORAL_TABLET | Freq: Three times a day (TID) | ORAL | Status: DC | PRN

## 2021-02-10 MED ORDER — ACETAMINOPHEN 650 MG RE SUPP: 650.0000 mg | Freq: Four times a day (QID) | RECTAL | Status: DC | PRN

## 2021-02-10 MED ORDER — ZOLPIDEM TARTRATE 5 MG PO TABS: 5.0000 mg | ORAL_TABLET | Freq: Every evening | ORAL | Status: DC | PRN

## 2021-02-10 MED ORDER — ONDANSETRON HCL 4 MG/2ML IJ SOLN
4.0000 mg | Freq: Once | INTRAMUSCULAR | Status: AC
  Administered 2021-02-10: 4 mg via INTRAVENOUS
  Filled 2021-02-10: qty 2

## 2021-02-10 MED ORDER — ACETAMINOPHEN 325 MG PO TABS
650.0000 mg | ORAL_TABLET | Freq: Four times a day (QID) | ORAL | Status: DC | PRN
  Administered 2021-02-13 – 2021-02-19 (×6): 650 mg via ORAL
  Filled 2021-02-10 (×7): qty 2

## 2021-02-10 NOTE — ED Notes (Signed)
pts son POA [phone]

## 2021-02-10 NOTE — ED Provider Notes (Signed)
[name] Provider Note   CSN: [id number] Arrival date & time: 02/10/21  1404     History Chief Complaint  Patient presents with  . Hematemesis    [name] is a 80 y.o. male.  Patient presents ER chief complaint of generalized weakness shortness of breath and nausea.  He states has been vomiting all night last night he has abdominal pain with vomiting but the pain is subsided as he is no longer vomiting.  The vomitus last night is described as dark colored no bright red blood noted.  States he feels weak and tired.  He was recently admitted a month ago for acute kidney injury and generalized deconditioning admitted to the hospital transfer to nursing home.  Patient states he did not like it at the nursing home and left just a few days ago.  He has since daughter-in-law coming to check up on him but has not been doing well in the last few days and presents back to the ER.  Denies fevers or cough.  Denies any diarrhea or abdominal pain at this time.        Past Medical History:  Diagnosis Date  . Anxiety   . Arthritis   . Coronary artery disease    have an appointment with Dr Johnsie Cancel 11/15/14  . Erectile dysfunction   . Fall 01/17/2021  . Frozen shoulder   . GERD (gastroesophageal reflux disease)   . Guillain Barr syndrome ([place]) 2009   Following the H1 N1 flu vaccine  . History of elevated lipids   . Hypertension   . Osteoarthritis   . Spinal stenosis 11/24/2014    Patient Active Problem List   Diagnosis Date Noted  . Thrombocytopenia ([place]) 01/25/2021  . Acute CHF (congestive heart failure) ([place]) 01/17/2021  . Hypertensive urgency 01/17/2021  . Bradycardia 01/17/2021  . Fall 01/17/2021  . Near syncope 01/17/2021  . AKI (acute kidney injury) ([place])   . Rotator cuff tear arthropathy of both shoulders 01/25/2016  . Degenerative arthritis of knee, bilateral 01/25/2016  . Status post lumbar spinal fusion 11/29/2014  . GERD  (gastroesophageal reflux disease) 11/25/2014  . Spinal stenosis 11/24/2014  . CAD (coronary artery disease) 11/15/2014  . HTN (hypertension) 11/15/2014  . Hyperlipidemia 11/15/2014    Past Surgical History:  Procedure Laterality Date  . CARDIAC SURGERY     2 stents placed   . CORONARY ANGIOPLASTY     mid and proximal LAD stent 09/1999 [facility])  . posterior lumbar fusion L4-5  11-24-14       Family History  Problem Relation Age of Onset  . Heart Problems Father   . Heart disease Father   . Sudden death Father     Social History   Tobacco Use  . Smoking status: Former Smoker    Packs/day: 0.50    Years: 10.00    Pack years: 5.00    Types: Cigarettes    Quit date: 11/11/1974    Years since quitting: 46.2  . Smokeless tobacco: Never Used  . Tobacco comment: age 68 yrs  Vaping Use  . Vaping Use: Never used  Substance Use Topics  . Alcohol use: Yes    Comment: occasionally  . Drug use: No    Home Medications Prior to Admission medications   Medication Sig Start Date End Date Taking? Authorizing Provider  amLODipine (NORVASC) 10 MG tablet Take 1 tablet (10 mg total) by mouth daily. 02/06/21   Medina-Vargas, Monina  C, NP  aspirin 81 MG chewable tablet Chew 1 tablet (81 mg total) by mouth daily. 02/06/21   Medina-Vargas, Monina C, NP  calcium carbonate (TUMS EX) 750 MG chewable tablet Chew 1 tablet (750 mg total) by mouth 3 (three) times daily. 02/06/21   Medina-Vargas, Monina C, NP  metoprolol tartrate (LOPRESSOR) 25 MG tablet Take 1 tablet (25 mg total) by mouth 2 (two) times daily. 02/06/21   Medina-Vargas, Monina C, NP    Allergies    Patient has no known allergies.  Review of Systems   Review of Systems  Constitutional: Negative for fever.  HENT: Negative for ear pain and sore throat.   Eyes: Negative for pain.  Respiratory: Negative for cough.   Cardiovascular: Negative for chest pain.  Gastrointestinal: Positive for vomiting.  Genitourinary: Negative  for flank pain.  Musculoskeletal: Negative for back pain.  Skin: Negative for color change and rash.  Neurological: Negative for syncope.  All other systems reviewed and are negative.   Physical Exam Updated Vital Signs BP 106/72   Pulse 96   Temp (!) 97.5 F (36.4 C)   Resp 20   SpO2 91%   Physical Exam Constitutional:      General: He is not in acute distress.    Appearance: He is well-developed.  HENT:     Head: Normocephalic.     Nose: Nose normal.  Eyes:     Extraocular Movements: Extraocular movements intact.  Cardiovascular:     Rate and Rhythm: Normal rate.  Pulmonary:     Effort: Pulmonary effort is normal.  Abdominal:     Palpations: Abdomen is soft.     Tenderness: There is no abdominal tenderness.  Musculoskeletal:     Right lower leg: Edema present.     Left lower leg: Edema present.  Skin:    Coloration: Skin is not jaundiced.  Neurological:     Mental Status: He is alert. Mental status is at baseline.     ED Results / Procedures / Treatments   Labs (all labs ordered are listed, but only abnormal results are displayed) Labs Reviewed  COMPREHENSIVE METABOLIC PANEL - Abnormal; Notable for the following components:      Result Value   CO2 18 (*)    BUN 58 (*)    Creatinine, Ser 2.71 (*)    Total Protein 6.4 (*)    Albumin 2.9 (*)    Total Bilirubin 1.3 (*)    GFR, Estimated 23 (*)    Anion gap 17 (*)    All other components within normal limits  CBC - Abnormal; Notable for the following components:   WBC 11.4 (*)    All other components within normal limits  URINALYSIS, ROUTINE W REFLEX MICROSCOPIC - Abnormal; Notable for the following components:   Color, Urine AMBER (*)    APPearance CLOUDY (*)    Hgb urine dipstick MODERATE (*)    Protein, ur 100 (*)    Leukocytes,Ua LARGE (*)    WBC, UA >50 (*)    Bacteria, UA MANY (*)    All other components within normal limits  LACTIC ACID, PLASMA - Abnormal; Notable for the following components:    Lactic Acid, Venous 2.9 (*)    All other components within normal limits  RESP PANEL BY RT-PCR (FLU A&B, COVID) [id number]  URINE CULTURE  CULTURE, BLOOD (ROUTINE X 2)  CULTURE, BLOOD (ROUTINE X 2)  PROTIME-INR  LACTIC ACID, PLASMA  BRAIN NATRIURETIC PEPTIDE  POC OCCULT BLOOD,  ED  TYPE AND SCREEN    EKG None  Radiology DG Chest 1 View  Result Date: 02/10/2021 CLINICAL DATA:  Hypotension. EXAM: CHEST  1 VIEW COMPARISON:  January 17, 2021 FINDINGS: Prominent skin fold is seen over the right chest. The heart, hila, mediastinum, lungs, and pleura are unremarkable. IMPRESSION: No active disease. Electronically Signed   By: Dorise Bullion III M.D   On: 02/10/2021 16:00    Procedures .Critical Care Performed by: Luna Fuse, MD Authorized by: Luna Fuse, MD   Critical care provider statement:    Critical care time (minutes):  40   Critical care time was exclusive of:  Separately billable procedures and treating other patients and teaching time   Critical care was necessary to treat or prevent imminent or life-threatening deterioration of the following conditions:  Sepsis and respiratory failure     Medications Ordered in ED Medications  piperacillin-tazobactam (ZOSYN) IVPB 3.375 g (has no administration in time range)  pantoprazole (PROTONIX) injection 40 mg (40 mg Intravenous Given 02/10/21 1644)  ondansetron (ZOFRAN) injection 4 mg (4 mg Intravenous Given 02/10/21 1644)  lactated ringers bolus 1,000 mL (0 mLs Intravenous Stopped 02/10/21 1810)    ED Course  I have reviewed the triage vital signs and the nursing notes.  Pertinent labs & imaging results that were available during my care of the patient were reviewed by me and considered in my medical decision making (see chart for details).    MDM Rules/Calculators/A&P                          Patient is awake alert, however appears very fatigued and tired.  He has generalized deconditioning and generalized global  weakness. Labs show white count 11 hemoglobin normal.  Lactic acid elevated. Patient had episode of hypotension blood pressure dropping to about 86 systolic.  Improved with IV fluid hydration 1 L bolus given.  Lactic acid elevated at 2.9.  Patient started on broad-spectrum antibiotics.  Urinalysis shows positive UTI chest x-ray is unremarkable. Etiology of hypoxemia unclear.  Pulmonary embolism considered, however unable to pursue CT angio given his acute kidney injury. Patient admitted to the hospitalist team.  Final Clinical Impression(s) / ED Diagnoses Final diagnoses:  Physical deconditioning  Acute kidney injury ([place])  Hypotensive episode  Sepsis, due to unspecified organism, unspecified whether acute organ dysfunction present [facility])  Hypoxemia    Rx / DC Orders ED Discharge Orders    None       Luna Fuse, MD 02/10/21 [phone]

## 2021-02-10 NOTE — ED Notes (Signed)
pts son peter updated at this time. [phone]

## 2021-02-10 NOTE — ED Triage Notes (Addendum)
Pt to triage via [facility] from home where he is living by himself.  Reports coffee ground emesis that started today and generalized abd pain since yesterday.  Pt was in nursing home and discharged home on 3/1 after requesting discharge. On EMS arrival BP 84/60.  Abd distended.  90% on 2 liters.  Increased to 99% on 4 liters.  20 g LFA..  NS 500cc repeat BP 120/70.

## 2021-02-10 NOTE — ED Notes (Signed)
Provider notified of critical lactic 2.9

## 2021-02-10 NOTE — H&P (Signed)
History and Physical   Birch Farino VPX:[id number] DOB: Nov 17, 1941 DOA: 02/10/2021  Referring MD/NP/PA: She Dr. Almyra Free  PCP: [name], NP   Patient coming from: Home  Chief Complaint: Weakness  HPI: [name] is a 80 y.o. male with medical history significant of coronary artery disease: GERD, Gilbert's syndrome, hypertension, spinal stenosis, osteoarthritis, who was recently admitted in February with AKI.  Patient is back again with complaint of vomiting blood.  He had nausea vomiting initially cleared but according to patient the last one appears to be coffee-ground.  This was not observed in the ER.  Stool guaiac is currently pending.  Hemodynamically he appears stable and hemoglobin remains stable.  Patient however was noted to be dehydrated.  He also was noted to be in acute kidney injury once again.  He denied any fever or chills.  He has apparently been living with his son and has been drinking water.  No evidence of obstructive uropathy.  Patient was in the nursing home after leaving the hospital last time but is now home where he went about a week ago.  Since arriving home he has gradually worsened and has not been doing well in the last couple of days.  Patient is being readmitted to the hospital therefore for further evaluation and treatment..  ED Course: Temperature is 98.6 blood pressure 86/69 with a pulse 99 respirate 23 oxygen sats 90% on room air.  Chemistry showed glucose 98 BUN is 58 and creatinine 2.71.  His baseline was 1.19.  Albumin is 2.9.  Total bilirubin 1.3.  Lactic acid 3.0 BNP 437.  CBC entirely within normal.  COVID-19 screen is negative.  Urinalysis showed cloudy urine with moderate hemoglobin.  Has large leukocytes.  WBC more than 50 and many bacteria.  Chest x-ray showed no acute findings.  Patient is being admitted with significant dehydration, UTI as well as AKI.  Review of Systems: As per HPI otherwise 10 point review of systems negative.    Past  Medical History:  Diagnosis Date  . Anxiety   . Arthritis   . Coronary artery disease    have an appointment with Dr Johnsie Cancel 11/15/14  . Erectile dysfunction   . Fall 01/17/2021  . Frozen shoulder   . GERD (gastroesophageal reflux disease)   . Guillain Barr syndrome ([place]) 2009   Following the H1 N1 flu vaccine  . History of elevated lipids   . Hypertension   . Osteoarthritis   . Spinal stenosis 11/24/2014    Past Surgical History:  Procedure Laterality Date  . CARDIAC SURGERY     2 stents placed   . CORONARY ANGIOPLASTY     mid and proximal LAD stent 09/1999 [facility])  . posterior lumbar fusion L4-5  11-24-14     reports that he quit smoking about 46 years ago. His smoking use included cigarettes. He has a 5.00 pack-year smoking history. He has never used smokeless tobacco. He reports current alcohol use. He reports that he does not use drugs.  No Known Allergies  Family History  Problem Relation Age of Onset  . Heart Problems Father   . Heart disease Father   . Sudden death Father      Prior to Admission medications   Medication Sig Start Date End Date Taking? Authorizing Provider  amLODipine (NORVASC) 10 MG tablet Take 1 tablet (10 mg total) by mouth daily. 02/06/21   Medina-Vargas, Monina C, NP  aspirin 81 MG chewable tablet Chew 1 tablet (  81 mg total) by mouth daily. 02/06/21   Medina-Vargas, Monina C, NP  calcium carbonate (TUMS EX) 750 MG chewable tablet Chew 1 tablet (750 mg total) by mouth 3 (three) times daily. 02/06/21   Medina-Vargas, Monina C, NP  metoprolol tartrate (LOPRESSOR) 25 MG tablet Take 1 tablet (25 mg total) by mouth 2 (two) times daily. 02/06/21   Medina-Vargas, Jaymes Graff C, NP    Physical Exam: Vitals:   02/10/21 2015 02/10/21 2021 02/10/21 2104 02/10/21 2105  BP: 101/69  97/67 97/67  Pulse: 95  99 99  Resp: 20  20 20   Temp:  98.6 F (37 C) (!) 97.5 F (36.4 C) (!) 97.5 F (36.4 C)  TempSrc:  Oral  Oral  SpO2: 91%  92% 92%  Weight:     67.9 kg  Height:    5\' 10"  (1.778 m)      Constitutional: Chronically ill looking, weak, debilitated, no distress Vitals:   02/10/21 2015 02/10/21 2021 02/10/21 2104 02/10/21 2105  BP: 101/69  97/67 97/67  Pulse: 95  99 99  Resp: 20  20 20   Temp:  98.6 F (37 C) (!) 97.5 F (36.4 C) (!) 97.5 F (36.4 C)  TempSrc:  Oral  Oral  SpO2: 91%  92% 92%  Weight:    67.9 kg  Height:    5\' 10"  (1.778 m)   Eyes: PERRL, lids and conjunctivae normal ENMT: Mucous membranes are dry. Posterior pharynx clear of any exudate or lesions.Normal dentition.  Neck: normal, supple, no masses, no thyromegaly Respiratory: clear to auscultation bilaterally, no wheezing, no crackles. Normal respiratory effort. No accessory muscle use.  Cardiovascular: Regular rate and rhythm, no murmurs / rubs / gallops. No extremity edema. 2+ pedal pulses. No carotid bruits.  Abdomen: no tenderness, no masses palpated. No hepatosplenomegaly. Bowel sounds positive.  Musculoskeletal: no clubbing / cyanosis. No joint deformity upper and lower extremities. Good ROM, no contractures. Normal muscle tone.  Skin: Dry and coarse skin with multiple rashes and bruises no ulcers. No induration Neurologic: CN 2-12 grossly intact. Sensation intact, DTR normal. Strength 5/5 in all 4.  Psychiatric: Normal judgment and insight. Alert and oriented x 3. Normal mood.     Labs on Admission: I have personally reviewed following labs and imaging studies  CBC: Recent Labs  Lab 02/10/21 1437 02/10/21 2000  WBC 11.4* 8.8  HGB 14.8 15.5  HCT 46.2 47.3  MCV 99.1 97.1  PLT 184 419   Basic Metabolic Panel: Recent Labs  Lab 02/10/21 1437 02/10/21 2000  NA 137  --   K 3.6  --   CL 102  --   CO2 18*  --   GLUCOSE 98  --   BUN 58*  --   CREATININE 2.71* 2.75*  CALCIUM 9.8  --    GFR: Estimated Creatinine Clearance: 20.9 mL/min (A) (by C-G formula based on SCr of 2.75 mg/dL (H)). Liver Function Tests: Recent Labs  Lab  02/10/21 1437  AST 27  ALT 37  ALKPHOS 108  BILITOT 1.3*  PROT 6.4*  ALBUMIN 2.9*   No results for input(s): LIPASE, AMYLASE in the last 168 hours. No results for input(s): AMMONIA in the last 168 hours. Coagulation Profile: Recent Labs  Lab 02/10/21 1635  INR 1.1   Cardiac Enzymes: No results for input(s): CKTOTAL, CKMB, CKMBINDEX, TROPONINI in the last 168 hours. BNP (last 3 results) Recent Labs    02/17/20 1505  PROBNP 329.0*   HbA1C: No results for input(s): HGBA1C  in the last 72 hours. CBG: No results for input(s): GLUCAP in the last 168 hours. Lipid Profile: No results for input(s): CHOL, HDL, LDLCALC, TRIG, CHOLHDL, LDLDIRECT in the last 72 hours. Thyroid Function Tests: No results for input(s): TSH, T4TOTAL, FREET4, T3FREE, THYROIDAB in the last 72 hours. Anemia Panel: No results for input(s): VITAMINB12, FOLATE, FERRITIN, TIBC, IRON, RETICCTPCT in the last 72 hours. Urine analysis:    Component Value Date/Time   COLORURINE AMBER (A) 02/10/2021 1814   APPEARANCEUR CLOUDY (A) 02/10/2021 1814   LABSPEC 1.010 02/10/2021 1814   PHURINE 6.0 02/10/2021 1814   GLUCOSEU NEGATIVE 02/10/2021 1814   HGBUR MODERATE (A) 02/10/2021 1814   BILIRUBINUR NEGATIVE 02/10/2021 1814   BILIRUBINUR n 10/12/2015 1553   KETONESUR NEGATIVE 02/10/2021 1814   PROTEINUR 100 (A) 02/10/2021 1814   UROBILINOGEN 0.2 10/12/2015 1553   UROBILINOGEN 0.2 11/11/2014 1524   NITRITE NEGATIVE 02/10/2021 1814   LEUKOCYTESUR LARGE (A) 02/10/2021 1814   Sepsis Labs: @LABRCNTIP (procalcitonin:4,lacticidven:4) ) Recent Results (from the past 240 hour(s))  Resp Panel by RT-PCR (Flu A&B, Covid) Nasopharyngeal Swab     Status: None   Collection Time: 02/10/21  4:26 PM   Specimen: Nasopharyngeal Swab; Nasopharyngeal(NP) swabs in vial transport medium  Result Value Ref Range Status   SARS Coronavirus 2 by RT PCR NEGATIVE NEGATIVE Final    Comment: (NOTE) SARS-CoV-2 target nucleic acids are NOT  DETECTED.  The SARS-CoV-2 RNA is generally detectable in upper respiratory specimens during the acute phase of infection. The lowest concentration of SARS-CoV-2 viral copies this assay can detect is 138 copies/mL. A negative result does not preclude SARS-Cov-2 infection and should not be used as the sole basis for treatment or other patient management decisions. A negative result may occur with  improper specimen collection/handling, submission of specimen other than nasopharyngeal swab, presence of viral mutation(s) within the areas targeted by this assay, and inadequate number of viral copies(<138 copies/mL). A negative result must be combined with clinical observations, patient history, and epidemiological information. The expected result is Negative.  Fact Sheet for Patients:  EntrepreneurPulse.com.au  Fact Sheet for Healthcare Providers:  IncredibleEmployment.be  This test is no t yet approved or cleared by the [place] FDA and  has been authorized for detection and/or diagnosis of SARS-CoV-2 by FDA under an Emergency Use Authorization (EUA). This EUA will remain  in effect (meaning this test can be used) for the duration of the COVID-19 declaration under Section 564(b)(1) of the Act, 21 U.S.C.section 360bbb-3(b)(1), unless the authorization is terminated  or revoked sooner.       Influenza A by PCR NEGATIVE NEGATIVE Final   Influenza B by PCR NEGATIVE NEGATIVE Final    Comment: (NOTE) The Xpert Xpress SARS-CoV-2/FLU/RSV plus assay is intended as an aid in the diagnosis of influenza from Nasopharyngeal swab specimens and should not be used as a sole basis for treatment. Nasal washings and aspirates are unacceptable for Xpert Xpress SARS-CoV-2/FLU/RSV testing.  Fact Sheet for Patients: EntrepreneurPulse.com.au  Fact Sheet for Healthcare Providers: IncredibleEmployment.be  This test is not yet  approved or cleared by the [place] FDA and has been authorized for detection and/or diagnosis of SARS-CoV-2 by FDA under an Emergency Use Authorization (EUA). This EUA will remain in effect (meaning this test can be used) for the duration of the COVID-19 declaration under Section 564(b)(1) of the Act, 21 U.S.C. section 360bbb-3(b)(1), unless the authorization is terminated or revoked.  Performed at [facility], [place] [address],  [place], [place] [address]      Radiological Exams on Admission: DG Chest 1 View  Result Date: 02/10/2021 CLINICAL DATA:  Hypotension. EXAM: CHEST  1 VIEW COMPARISON:  January 17, 2021 FINDINGS: Prominent skin fold is seen over the right chest. The heart, hila, mediastinum, lungs, and pleura are unremarkable. IMPRESSION: No active disease. Electronically Signed   By: Dorise Bullion III M.D   On: 02/10/2021 16:00      Assessment/Plan Principal Problem:   AKI (acute kidney injury) ([place]) Active Problems:   CAD (coronary artery disease)   HTN (hypertension)   Hyperlipidemia   GERD (gastroesophageal reflux disease)   Acute lower UTI   Hematemesis     #1 AKI: Suspected prerenal.  Patient appears dehydrated.  He however has UTI as well.  At this point we will admit the patient.  Initiate IV fluids.  We will get urine cultures as well.  I will get renal ultrasound to rule out obstructive uropathy.  #2 UTI: Empiric Rocephin.  Follow urine and blood cultures.  #3 reported hematemesis: Patient's H&H is stable.  Stool guaiac currently pending.  If confirmed we will initiate GI consultation.  In the meantime we will make sure patient is on PPI.  #4 Essential HTN: Continue home medications  #5 GERD: Continue with PPIs  #6 hyperlipidemia: Resume home statin  #7 coronary artery disease: Stable.   DVT prophylaxis: Heparin Code Status: Full code Family Communication: No family at bedside Disposition Plan: To be determined Consults called:  None Admission status: Inpatient  Severity of Illness: The appropriate patient status for this patient is INPATIENT. Inpatient status is judged to be reasonable and necessary in order to provide the required intensity of service to ensure the patient's safety. The patient's presenting symptoms, physical exam findings, and initial radiographic and laboratory data in the context of their chronic comorbidities is felt to place them at high risk for further clinical deterioration. Furthermore, it is not anticipated that the patient will be medically stable for discharge from the hospital within 2 midnights of admission. The following factors support the patient status of inpatient.   " The patient's presenting symptoms include nausea vomiting. " The worrisome physical exam findings include dry mucous membranes. " The initial radiographic and laboratory data are worrisome because of AKI. " The chronic co-morbidities include coronary artery disease.   * I certify that at the point of admission it is my clinical judgment that the patient will require inpatient hospital care spanning beyond 2 midnights from the point of admission due to high intensity of service, high risk for further deterioration and high frequency of surveillance required.Barbette Merino MD [facility] Pager [phone]  If 7PM-7AM, please contact night-coverage [email or web address] Password [facility]  02/10/2021, 10:06 PM

## 2021-02-11 ENCOUNTER — Inpatient Hospital Stay (HOSPITAL_COMMUNITY): Payer: Medicare Other

## 2021-02-11 DIAGNOSIS — N3289 Other specified disorders of bladder: Secondary | ICD-10-CM | POA: Diagnosis not present

## 2021-02-11 DIAGNOSIS — E782 Mixed hyperlipidemia: Secondary | ICD-10-CM

## 2021-02-11 DIAGNOSIS — N179 Acute kidney failure, unspecified: Secondary | ICD-10-CM | POA: Diagnosis not present

## 2021-02-11 DIAGNOSIS — I10 Essential (primary) hypertension: Secondary | ICD-10-CM

## 2021-02-11 DIAGNOSIS — K219 Gastro-esophageal reflux disease without esophagitis: Secondary | ICD-10-CM

## 2021-02-11 DIAGNOSIS — N390 Urinary tract infection, site not specified: Secondary | ICD-10-CM | POA: Diagnosis not present

## 2021-02-11 DIAGNOSIS — I2510 Atherosclerotic heart disease of native coronary artery without angina pectoris: Secondary | ICD-10-CM

## 2021-02-11 LAB — COMPREHENSIVE METABOLIC PANEL
ALT: 25 U/L (ref 0–44)
AST: 16 U/L (ref 15–41)
Albumin: 2.1 g/dL — ABNORMAL LOW (ref 3.5–5.0)
Alkaline Phosphatase: 72 U/L (ref 38–126)
Anion gap: 13 (ref 5–15)
BUN: 62 mg/dL — ABNORMAL HIGH (ref 8–23)
CO2: 20 mmol/L — ABNORMAL LOW (ref 22–32)
Calcium: 8.8 mg/dL — ABNORMAL LOW (ref 8.9–10.3)
Chloride: 104 mmol/L (ref 98–111)
Creatinine, Ser: 2.58 mg/dL — ABNORMAL HIGH (ref 0.61–1.24)
GFR, Estimated: 25 mL/min — ABNORMAL LOW (ref >60)
Glucose, Bld: 92 mg/dL (ref 70–99)
Potassium: 3.4 mmol/L — ABNORMAL LOW (ref 3.5–5.1)
Sodium: 137 mmol/L (ref 135–145)
Total Bilirubin: 1 mg/dL (ref 0.3–1.2)
Total Protein: 4.8 g/dL — ABNORMAL LOW (ref 6.5–8.1)

## 2021-02-11 LAB — LACTIC ACID, PLASMA: Lactic Acid, Venous: 1.6 mmol/L (ref 0.5–1.9)

## 2021-02-11 LAB — BLOOD CULTURE ID PANEL (REFLEXED) - BCID2

## 2021-02-11 LAB — CBC
HCT: 37 % — ABNORMAL LOW (ref 39.0–52.0)
Hemoglobin: 13.1 g/dL (ref 13.0–17.0)
MCH: 32.8 pg (ref 26.0–34.0)
MCHC: 35.4 g/dL (ref 30.0–36.0)
MCV: 92.5 fL (ref 80.0–100.0)
Platelets: 146 10*3/uL — ABNORMAL LOW (ref 150–400)
RBC: 4 MIL/uL — ABNORMAL LOW (ref 4.22–5.81)
RDW: 13 % (ref 11.5–15.5)
WBC: 7.7 10*3/uL (ref 4.0–10.5)
nRBC: 0 % (ref 0.0–0.2)

## 2021-02-11 MED ORDER — SODIUM CHLORIDE 0.9 % IV SOLN
2.0000 g | INTRAVENOUS | Status: DC
  Administered 2021-02-12 (×2): 2 g via INTRAVENOUS
  Filled 2021-02-11: qty 20
  Filled 2021-02-11 (×2): qty 2

## 2021-02-11 NOTE — Progress Notes (Signed)
PHARMACY - PHYSICIAN COMMUNICATION CRITICAL VALUE ALERT - BLOOD CULTURE IDENTIFICATION (BCID)  Clinton [name] is an 80 y.o. male who presented to [facility] on 02/10/2021 with a chief complaint of AKI. Found to have large bladder tumor this admission.  Assessment:  Gram negative rod in 1/4 bottles, identified as E. coli, no resistance mechanism detected  Name of physician (or Provider) Contacted: Dr. Wynetta Emery  Current antibiotics: ceftriaxone 1 gm IV q24h  Changes to prescribed antibiotics recommended: Increase ceftriaxone to 2 gm IV q24h   Results for orders placed or performed during the hospital encounter of 02/10/21  Blood Culture ID Panel (Reflexed) (Collected: 02/10/2021  4:47 PM)  Result Value Ref Range   Enterococcus faecalis NOT DETECTED NOT DETECTED   Enterococcus Faecium NOT DETECTED NOT DETECTED   Listeria monocytogenes NOT DETECTED NOT DETECTED   Staphylococcus species NOT DETECTED NOT DETECTED   Staphylococcus aureus (BCID) NOT DETECTED NOT DETECTED   Staphylococcus epidermidis NOT DETECTED NOT DETECTED   Staphylococcus lugdunensis NOT DETECTED NOT DETECTED   Streptococcus species NOT DETECTED NOT DETECTED   Streptococcus agalactiae NOT DETECTED NOT DETECTED   Streptococcus pneumoniae NOT DETECTED NOT DETECTED   Streptococcus pyogenes NOT DETECTED NOT DETECTED   A.calcoaceticus-baumannii NOT DETECTED NOT DETECTED   Bacteroides fragilis NOT DETECTED NOT DETECTED   Enterobacterales DETECTED (A) NOT DETECTED   Enterobacter cloacae complex NOT DETECTED NOT DETECTED   Escherichia coli DETECTED (A) NOT DETECTED   Klebsiella aerogenes NOT DETECTED NOT DETECTED   Klebsiella oxytoca NOT DETECTED NOT DETECTED   Klebsiella pneumoniae NOT DETECTED NOT DETECTED   Proteus species NOT DETECTED NOT DETECTED   Salmonella species NOT DETECTED NOT DETECTED   Serratia marcescens NOT DETECTED NOT DETECTED   Haemophilus influenzae NOT DETECTED NOT DETECTED   Neisseria meningitidis NOT  DETECTED NOT DETECTED   Pseudomonas aeruginosa NOT DETECTED NOT DETECTED   Stenotrophomonas maltophilia NOT DETECTED NOT DETECTED   Candida albicans NOT DETECTED NOT DETECTED   Candida auris NOT DETECTED NOT DETECTED   Candida glabrata NOT DETECTED NOT DETECTED   Candida krusei NOT DETECTED NOT DETECTED   Candida parapsilosis NOT DETECTED NOT DETECTED   Candida tropicalis NOT DETECTED NOT DETECTED   Cryptococcus neoformans/gattii NOT DETECTED NOT DETECTED   CTX-M ESBL NOT DETECTED NOT DETECTED   Carbapenem resistance IMP NOT DETECTED NOT DETECTED   Carbapenem resistance KPC NOT DETECTED NOT DETECTED   Carbapenem resistance NDM NOT DETECTED NOT DETECTED   Carbapenem resist OXA 48 LIKE NOT DETECTED NOT DETECTED   Carbapenem resistance VIM NOT DETECTED NOT DETECTED    Berenice Bouton, PharmD, BCPS PGY2 Pharmacy Resident Phone between 7 am - 3:30 pm: [phone]  Please check AMION for all [place] phone numbers After 10:00 PM, call [place] [phone]  02/11/2021  2:10 PM

## 2021-02-11 NOTE — Progress Notes (Addendum)
Alert and oriented x 4 but forget and needs to be reminded of time and place. Denies pain or discomfort at this time.Admitted from ED. Oriented to room and call bell. Skin check done with Tracie Harrier, Therapist, sports. Call bell within reach. Bed alarm in use. IV fluids infusing and tolerated well.Continue plan of care.

## 2021-02-11 NOTE — Consult Note (Signed)
Urology Consult   Physician requesting consult: Dr. Irwin Brakeman  Reason for consult: AKI, bladder mass  History of Present Illness: Clinton [name] is a 80 y.o. who has a baseline Cr of 1.1 who was admitted with GI symptoms of nausea and vomiting blood.  His Cr was 2.7 on admission.  A renal ultrasound was performed and demonstrated mild left hydronephrosis with a bladder mass measuring up to 15 cm.  The patient is not particularly communicative but is able to answer some questions.  He denies gross hematuria.  UA did look infected and urine culture is pending. He is on ceftriaxone.  He cannot give me much history regarding his baseline voiding symptoms.  He is not on medication for BPH.    Past Medical History:  Diagnosis Date  . Anxiety   . Arthritis   . Coronary artery disease    have an appointment with Dr Johnsie Cancel 11/15/14  . Erectile dysfunction   . Fall 01/17/2021  . Frozen shoulder   . GERD (gastroesophageal reflux disease)   . Guillain Barr syndrome ([place]) 2009   Following the H1 N1 flu vaccine  . History of elevated lipids   . Hypertension   . Osteoarthritis   . Spinal stenosis 11/24/2014    Past Surgical History:  Procedure Laterality Date  . CARDIAC SURGERY     2 stents placed   . CORONARY ANGIOPLASTY     mid and proximal LAD stent 09/1999 [facility])  . posterior lumbar fusion L4-5  11-24-14    Medications:  Home meds:  No current facility-administered medications on file prior to encounter.   Current Outpatient Medications on File Prior to Encounter  Medication Sig Dispense Refill  . aspirin 81 MG chewable tablet Chew 1 tablet (81 mg total) by mouth daily. 30 tablet 0  . calcium carbonate (TUMS EX) 750 MG chewable tablet Chew 1 tablet (750 mg total) by mouth 3 (three) times daily. (Patient taking differently: Chew 1 tablet by mouth 3 (three) times daily as needed for heartburn (acid reflux).) 90 tablet 0  . lisinopril (ZESTRIL) 20 MG tablet  Take 20 mg by mouth every morning.    . metoprolol tartrate (LOPRESSOR) 25 MG tablet Take 1 tablet (25 mg total) by mouth 2 (two) times daily. (Patient taking differently: Take 25 mg by mouth every morning.) 60 tablet 0  . amLODipine (NORVASC) 10 MG tablet Take 1 tablet (10 mg total) by mouth daily. (Patient not taking: No sig reported) 30 tablet 0     Scheduled Meds: . heparin  5,000 Units Subcutaneous Q8H  . pantoprazole (PROTONIX) IV  40 mg Intravenous Q12H   Continuous Infusions: . cefTRIAXone (ROCEPHIN)  IV 1 g (02/10/21 2311)  .  sodium bicarbonate (isotonic) infusion in sterile water 125 mL/hr at 02/11/21 0754   PRN Meds:.acetaminophen **OR** acetaminophen, calcium carbonate (dosed in mg elemental calcium), camphor-menthol **AND** hydrOXYzine, docusate sodium, feeding supplement (NEPRO CARB STEADY), ondansetron **OR** ondansetron (ZOFRAN) IV, sorbitol, zolpidem  Allergies: No Known Allergies  Family History  Problem Relation Age of Onset  . Heart Problems Father   . Heart disease Father   . Sudden death Father     Social History:  reports that he quit smoking about 46 years ago. His smoking use included cigarettes. He has a 5.00 pack-year smoking history. He has never used smokeless tobacco. He reports current alcohol use. He reports that he does not use drugs.  ROS: A complete review of systems was performed.  All systems are negative except for pertinent findings as noted.  Physical Exam:  Vital signs in last 24 hours: Temp:  [97.5 F (36.4 C)-98.8 F (37.1 C)] 98.4 F (36.9 C) (03/06 0453) Pulse Rate:  [72-100] 93 (03/06 0453) Resp:  [14-24] 19 (03/06 0453) BP: (84-121)/(57-79) 92/59 (03/06 0453) SpO2:  [90 %-99 %] 94 % (03/06 0453) Weight:  [67.9 kg] 67.9 kg (03/06 0500) Constitutional:  No acute distress. Lying in bed, minimally interactive but appropriate when he does answer Cardiovascular: No JVD Respiratory: Normal respiratory effort GI: Abdomen is mildly  distended, tenderness over mid abdomen Genitourinary: No CVAT. Normal male phallus, testes are descended bilaterally and non-tender and without masses, scrotum is normal in appearance without lesions or masses, perineum is normal on inspection. Lymphatic: No lymphadenopathy Neurologic: Grossly intact, no focal deficits Psychiatric: Normal mood and affect  Laboratory Data:  Recent Labs    02/10/21 1437 02/10/21 2000 02/11/21 0045  WBC 11.4* 8.8 7.7  HGB 14.8 15.5 13.1  HCT 46.2 47.3 37.0*  PLT 184 150 146*    Recent Labs    02/10/21 1437 02/10/21 2000 02/11/21 0045  NA 137  --  137  K 3.6  --  3.4*  CL 102  --  104  GLUCOSE 98  --  92  BUN 58*  --  62*  CALCIUM 9.8  --  8.8*  CREATININE 2.71* 2.75* 2.58*     Results for orders placed or performed during the hospital encounter of 02/10/21 (from the past 24 hour(s))  Type and screen [place]     Status: None   Collection Time: 02/10/21  2:27 PM  Result Value Ref Range   ABO/RH(D) O POS    Antibody Screen NEG    Sample Expiration      02/13/2021,2359 Performed at [facility], [place] [address] [place], [place] [address]   Comprehensive metabolic panel     Status: Abnormal   Collection Time: 02/10/21  2:37 PM  Result Value Ref Range   Sodium 137 135 - 145 mmol/L   Potassium 3.6 3.5 - 5.1 mmol/L   Chloride 102 98 - 111 mmol/L   CO2 18 (L) 22 - 32 mmol/L   Glucose, Bld 98 70 - 99 mg/dL   BUN 58 (H) 8 - 23 mg/dL   Creatinine, Ser 2.71 (H) 0.61 - 1.24 mg/dL   Calcium 9.8 8.9 - 10.3 mg/dL   Total Protein 6.4 (L) 6.5 - 8.1 g/dL   Albumin 2.9 (L) 3.5 - 5.0 g/dL   AST 27 15 - 41 U/L   ALT 37 0 - 44 U/L   Alkaline Phosphatase 108 38 - 126 U/L   Total Bilirubin 1.3 (H) 0.3 - 1.2 mg/dL   GFR, Estimated 23 (L) >60 mL/min   Anion gap 17 (H) 5 - 15  CBC     Status: Abnormal   Collection Time: 02/10/21  2:37 PM  Result Value Ref Range   WBC 11.4 (H) 4.0 - 10.5 K/uL   RBC 4.66 4.22 - 5.81 MIL/uL    Hemoglobin 14.8 13.0 - 17.0 g/dL   HCT 46.2 39.0 - 52.0 %   MCV 99.1 80.0 - 100.0 fL   MCH 31.8 26.0 - 34.0 pg   MCHC 32.0 30.0 - 36.0 g/dL   RDW 13.0 11.5 - 15.5 %   Platelets 184 150 - 400 K/uL   nRBC 0.0 0.0 - 0.2 %  Resp Panel by RT-PCR (Flu A&B, Covid)  Nasopharyngeal Swab     Status: None   Collection Time: 02/10/21  4:26 PM   Specimen: Nasopharyngeal Swab; Nasopharyngeal(NP) swabs in vial transport medium  Result Value Ref Range   SARS Coronavirus 2 by RT PCR NEGATIVE NEGATIVE   Influenza A by PCR NEGATIVE NEGATIVE   Influenza B by PCR NEGATIVE NEGATIVE  Culture, blood (routine x 2)     Status: None (Preliminary result)   Collection Time: 02/10/21  4:35 PM   Specimen: BLOOD  Result Value Ref Range   Specimen Description BLOOD SITE NOT SPECIFIED    Special Requests      BOTTLES DRAWN AEROBIC AND ANAEROBIC Blood Culture adequate volume   Culture      NO GROWTH < 24 HOURS Performed at [facility], [place] [address], [place], [place] [address]    Report Status PENDING   Lactic acid, plasma     Status: Abnormal   Collection Time: 02/10/21  4:35 PM  Result Value Ref Range   Lactic Acid, Venous 2.9 (HH) 0.5 - 1.9 mmol/L  Protime-INR     Status: None   Collection Time: 02/10/21  4:35 PM  Result Value Ref Range   Prothrombin Time 14.0 11.4 - 15.2 seconds   INR 1.1 0.8 - 1.2  Culture, blood (routine x 2)     Status: None (Preliminary result)   Collection Time: 02/10/21  4:47 PM   Specimen: BLOOD  Result Value Ref Range   Specimen Description BLOOD SITE NOT SPECIFIED    Special Requests      BOTTLES DRAWN AEROBIC AND ANAEROBIC Blood Culture adequate volume   Culture      NO GROWTH < 24 HOURS Performed at [facility], [address], [place], [place] [address]    Report Status PENDING   Urinalysis, Routine w reflex microscopic Urine, Catheterized     Status: Abnormal   Collection Time: 02/10/21  6:14 PM  Result Value Ref Range   Color, Urine AMBER (A) YELLOW    APPearance CLOUDY (A) CLEAR   Specific Gravity, Urine 1.010 1.005 - 1.030   pH 6.0 5.0 - 8.0   Glucose, UA NEGATIVE NEGATIVE mg/dL   Hgb urine dipstick MODERATE (A) NEGATIVE   Bilirubin Urine NEGATIVE NEGATIVE   Ketones, ur NEGATIVE NEGATIVE mg/dL   Protein, ur 100 (A) NEGATIVE mg/dL   Nitrite NEGATIVE NEGATIVE   Leukocytes,Ua LARGE (A) NEGATIVE   RBC / HPF 21-50 0 - 5 RBC/hpf   WBC, UA >50 (H) 0 - 5 WBC/hpf   Bacteria, UA MANY (A) NONE SEEN   Squamous Epithelial / LPF 0-5 0 - 5   WBC Clumps PRESENT    Mucus PRESENT   Lactic acid, plasma     Status: Abnormal   Collection Time: 02/10/21  8:00 PM  Result Value Ref Range   Lactic Acid, Venous 3.0 (HH) 0.5 - 1.9 mmol/L  Brain natriuretic peptide     Status: Abnormal   Collection Time: 02/10/21  8:00 PM  Result Value Ref Range   B Natriuretic Peptide 437.2 (H) 0.0 - 100.0 pg/mL  CBC     Status: None   Collection Time: 02/10/21  8:00 PM  Result Value Ref Range   WBC 8.8 4.0 - 10.5 K/uL   RBC 4.87 4.22 - 5.81 MIL/uL   Hemoglobin 15.5 13.0 - 17.0 g/dL   HCT 47.3 39.0 - 52.0 %   MCV 97.1 80.0 - 100.0 fL   MCH 31.8 26.0 - 34.0  pg   MCHC 32.8 30.0 - 36.0 g/dL   RDW 12.9 11.5 - 15.5 %   Platelets 150 150 - 400 K/uL   nRBC 0.0 0.0 - 0.2 %  Creatinine, serum     Status: Abnormal   Collection Time: 02/10/21  8:00 PM  Result Value Ref Range   Creatinine, Ser 2.75 (H) 0.61 - 1.24 mg/dL   GFR, Estimated 23 (L) >60 mL/min  Comprehensive metabolic panel     Status: Abnormal   Collection Time: 02/11/21 12:45 AM  Result Value Ref Range   Sodium 137 135 - 145 mmol/L   Potassium 3.4 (L) 3.5 - 5.1 mmol/L   Chloride 104 98 - 111 mmol/L   CO2 20 (L) 22 - 32 mmol/L   Glucose, Bld 92 70 - 99 mg/dL   BUN 62 (H) 8 - 23 mg/dL   Creatinine, Ser 2.58 (H) 0.61 - 1.24 mg/dL   Calcium 8.8 (L) 8.9 - 10.3 mg/dL   Total Protein 4.8 (L) 6.5 - 8.1 g/dL   Albumin 2.1 (L) 3.5 - 5.0 g/dL   AST 16 15 - 41 U/L   ALT 25 0 - 44 U/L   Alkaline Phosphatase  72 38 - 126 U/L   Total Bilirubin 1.0 0.3 - 1.2 mg/dL   GFR, Estimated 25 (L) >60 mL/min   Anion gap 13 5 - 15  CBC     Status: Abnormal   Collection Time: 02/11/21 12:45 AM  Result Value Ref Range   WBC 7.7 4.0 - 10.5 K/uL   RBC 4.00 (L) 4.22 - 5.81 MIL/uL   Hemoglobin 13.1 13.0 - 17.0 g/dL   HCT 37.0 (L) 39.0 - 52.0 %   MCV 92.5 80.0 - 100.0 fL   MCH 32.8 26.0 - 34.0 pg   MCHC 35.4 30.0 - 36.0 g/dL   RDW 13.0 11.5 - 15.5 %   Platelets 146 (L) 150 - 400 K/uL   nRBC 0.0 0.0 - 0.2 %  Lactic acid, plasma     Status: None   Collection Time: 02/11/21  8:22 AM  Result Value Ref Range   Lactic Acid, Venous 1.6 0.5 - 1.9 mmol/L   Recent Results (from the past 240 hour(s))  Resp Panel by RT-PCR (Flu A&B, Covid) Nasopharyngeal Swab     Status: None   Collection Time: 02/10/21  4:26 PM   Specimen: Nasopharyngeal Swab; Nasopharyngeal(NP) swabs in vial transport medium  Result Value Ref Range Status   SARS Coronavirus 2 by RT PCR NEGATIVE NEGATIVE Final    Comment: (NOTE) SARS-CoV-2 target nucleic acids are NOT DETECTED.  The SARS-CoV-2 RNA is generally detectable in upper respiratory specimens during the acute phase of infection. The lowest concentration of SARS-CoV-2 viral copies this assay can detect is 138 copies/mL. A negative result does not preclude SARS-Cov-2 infection and should not be used as the sole basis for treatment or other patient management decisions. A negative result may occur with  improper specimen collection/handling, submission of specimen other than nasopharyngeal swab, presence of viral mutation(s) within the areas targeted by this assay, and inadequate number of viral copies(<138 copies/mL). A negative result must be combined with clinical observations, patient history, and epidemiological information. The expected result is Negative.  Fact Sheet for Patients:  EntrepreneurPulse.com.au  Fact Sheet for Healthcare Providers:   IncredibleEmployment.be  This test is no t yet approved or cleared by the [place] FDA and  has been authorized for detection and/or diagnosis of SARS-CoV-2 by FDA under an  Emergency Use Authorization (EUA). This EUA will remain  in effect (meaning this test can be used) for the duration of the COVID-19 declaration under Section 564(b)(1) of the Act, 21 U.S.C.section 360bbb-3(b)(1), unless the authorization is terminated  or revoked sooner.       Influenza A by PCR NEGATIVE NEGATIVE Final   Influenza B by PCR NEGATIVE NEGATIVE Final    Comment: (NOTE) The Xpert Xpress SARS-CoV-2/FLU/RSV plus assay is intended as an aid in the diagnosis of influenza from Nasopharyngeal swab specimens and should not be used as a sole basis for treatment. Nasal washings and aspirates are unacceptable for Xpert Xpress SARS-CoV-2/FLU/RSV testing.  Fact Sheet for Patients: EntrepreneurPulse.com.au  Fact Sheet for Healthcare Providers: IncredibleEmployment.be  This test is not yet approved or cleared by the [place] FDA and has been authorized for detection and/or diagnosis of SARS-CoV-2 by FDA under an Emergency Use Authorization (EUA). This EUA will remain in effect (meaning this test can be used) for the duration of the COVID-19 declaration under Section 564(b)(1) of the Act, 21 U.S.C. section 360bbb-3(b)(1), unless the authorization is terminated or revoked.  Performed at [facility], [place] [address], [place], [place] [address]   Culture, blood (routine x 2)     Status: None (Preliminary result)   Collection Time: 02/10/21  4:35 PM   Specimen: BLOOD  Result Value Ref Range Status   Specimen Description BLOOD SITE NOT SPECIFIED  Final   Special Requests   Final    BOTTLES DRAWN AEROBIC AND ANAEROBIC Blood Culture adequate volume   Culture   Final    NO GROWTH < 24 HOURS Performed at [facility], [place] [address] [place], [place] [address]    Report Status PENDING  Incomplete  Culture, blood (routine x 2)     Status: None (Preliminary result)   Collection Time: 02/10/21  4:47 PM   Specimen: BLOOD  Result Value Ref Range Status   Specimen Description BLOOD SITE NOT SPECIFIED  Final   Special Requests   Final    BOTTLES DRAWN AEROBIC AND ANAEROBIC Blood Culture adequate volume   Culture   Final    NO GROWTH < 24 HOURS Performed at [facility], [place] [address], [place], [place] [address]    Report Status PENDING  Incomplete    Renal Function: Recent Labs    02/10/21 1437 02/10/21 2000 02/11/21 0045  CREATININE 2.71* 2.75* 2.58*   Estimated Creatinine Clearance: 22.3 mL/min (A) (by C-G formula based on SCr of 2.58 mg/dL (H)).  Radiologic Imaging: DG Chest 1 View  Result Date: 02/10/2021 CLINICAL DATA:  Hypotension. EXAM: CHEST  1 VIEW COMPARISON:  January 17, 2021 FINDINGS: Prominent skin fold is seen over the right chest. The heart, hila, mediastinum, lungs, and pleura are unremarkable. IMPRESSION: No active disease. Electronically Signed   By: Dorise Bullion III M.D   On: 02/10/2021 16:00   US RENAL  Result Date: 02/11/2021 CLINICAL DATA:  Acute renal failure EXAM: RENAL / URINARY TRACT ULTRASOUND COMPLETE COMPARISON:  None. FINDINGS: Right Kidney: Renal measurements: 10.4 x 4.3 x 5.4 cm = volume: 126 mL. Increased cortical echogenicity. No hydronephrosis. Left Kidney: Renal measurements: 10.8 x 5.3 x 5.5 cm = volume: 164.1 mL. Mild hydronephrosis. Increased cortical echogenicity. Bladder: There is a masslike region with internal blood flow in the posterior bladder measuring 15.2 x 4.1 x 12.6 cm. Other: None. IMPRESSION: 1. Large mass in the posterior bladder measuring 15.2 x 4.1  x 12.6 cm with internal blood flow. Recommend urologic consultation. 2. Mild left hydronephrosis. 3. Increased cortical echogenicity in the kidneys bilaterally suggesting medical renal disease. Electronically  Signed   By: Dorise Bullion III M.D   On: 02/11/2021 10:01    He just underwent a CT of the chest, abdomen, and pelvis due to concern of large bladder tumor.  Official radiology read not back but independent review suggest bilateral hydronephrosis with an extremely distended bladder extending up into the abdomen.  No evidence of pelvic/bladder mass noted.  I independently reviewed the above imaging studies.  Procedure:   Attempts to place a catheter by nursing staff earlier today were unsuccessful.  Under sterile conditions, I inserted an 18 Fr coude catheter without much resistance.  This returned 2 L of urine with the last few hundred cc being grossly purulent.  Impression/Recommendation 1) Urinary retention/ bilateral hydronephrosis/AKI/UTI: He has chronic urinary retention with an advanced UTI.  This is likely the cause of his AKI, hydronephrosis, and altered mental status.  Despite ultrasound findings, CT and clinical evaluation do not suggest bladder mass and findings on ultrasound are likely reflective of debris from severe infection.  Continue Foley catheter drainage and broad spectrum antibiotic pending urine culture.  Monitor electrolytes/volume status closely as he is at high risk of postobstructive diuresis.  Will follow and make further recommendations regarding follow up/ further disposition pending clinical improvement.  Dutch Gray 02/11/2021, 12:24 PM    Pryor Curia MD  CC: Dr. Irwin Brakeman

## 2021-02-11 NOTE — Progress Notes (Addendum)
PROGRESS NOTE   Clinton [name]  OFB:[id number] DOB: 28-Apr-1941 DOA: 02/10/2021 PCP: [name], NP   Chief Complaint  Patient presents with  . Hematemesis   Level of care: Telemetry Medical  Brief Admission History:  80 y.o. male with medical history significant of coronary artery disease: GERD, Gilbert's syndrome, hypertension, spinal stenosis, osteoarthritis, who was recently admitted in February with AKI.  Patient is back again with complaint of vomiting blood.  He had nausea vomiting initially cleared but according to patient the last one appears to be coffee-ground.  This was not observed in the ER.  Stool guaiac is currently pending.  Hemodynamically he appears stable and hemoglobin remains stable.  Patient however was noted to be dehydrated.  He also was noted to be in acute kidney injury once again.  He denied any fever or chills.  He has apparently been living with his son and has been drinking water.  No evidence of obstructive uropathy.  Patient was in the nursing home after leaving the hospital last time but is now home where he went about a week ago.  Since arriving home he has gradually worsened and has not been doing well in the last couple of days.  Patient is being readmitted to the hospital therefore for further evaluation and treatment.   Assessment & Plan:   Principal Problem:   AKI (acute kidney injury) ([place]) Active Problems:   CAD (coronary artery disease)   HTN (hypertension)   Hyperlipidemia   GERD (gastroesophageal reflux disease)   Acute lower UTI   Hematemesis  1. AKI - likely from chronic medical renal disease but do worry about postobstructive cause as renal US showing large posterior bladder mass and mild left hydronephrosis.  I have placed a call to urologist for a consultation.  Continue IV fluid for now as long as he is having urine output.   2. UTI - continue IV ceftriaxone for now pending culture.  3. Essential hypertension - with soft BPs will  hold BP meds for now 4. Large posterior bladder mass - I spoke with Dr. Alinda Money urology and plan is to get CT chest/abd pelvis without contrast and he will see patient with further recommendations. I have reached out to his family and I have asked for a palliative medicine consult given advanced nature of the tumor.    DVT prophylaxis: SQ heparin  Code Status: Full  Family Communication: son Laurey Arrow  Disposition: TBD Status is: Inpatient  Remains inpatient appropriate because:IV treatments appropriate due to intensity of illness or inability to take PO and Inpatient level of care appropriate due to severity of illness   Dispo: The patient is from: Home              Anticipated d/c is to: TBD              Patient currently is not medically stable to d/c.   Difficult to place patient No  Consultants:   Urology   Procedures:     Antimicrobials:  Ceftriaxone 3/5>>   Subjective: Pt denies pain or discomfort.    Objective: Vitals:   02/10/21 2105 02/11/21 0200 02/11/21 0453 02/11/21 0500  BP: 97/67 (!) 84/57 (!) 92/59   Pulse: 99 100 93   Resp: 20 (!) 24 19   Temp: (!) 97.5 F (36.4 C) 98.8 F (37.1 C) 98.4 F (36.9 C)   TempSrc: Oral Axillary Oral   SpO2: 92% 91% 94%   Weight: 67.9 kg   67.9 kg  Height: 5\' 10"  (1.778 m)       Intake/Output Summary (Last 24 hours) at 02/11/2021 1147 Last data filed at 02/11/2021 0800 Gross per 24 hour  Intake 644.38 ml  Output --  Net 644.38 ml   Filed Weights   02/10/21 2105 02/11/21 0500  Weight: 67.9 kg 67.9 kg    Examination:  General exam: elderly frail male, curled up in fetal position, mostly nonverbal, Appears calm and comfortable  Respiratory system: Clear to auscultation. Respiratory effort normal. Cardiovascular system: normal S1 & S2 heard. No JVD, murmurs, rubs, gallops or clicks. No pedal edema. Gastrointestinal system: Abdomen is nondistended, soft and nontender. No organomegaly or masses felt. Normal bowel sounds  heard. Central nervous system: somnolent but arousable. No focal neurological deficits. Extremities: Symmetric 5 x 5 power. Skin: No rashes, lesions or ulcers Psychiatry: unable to assess.   Data Reviewed: I have personally reviewed following labs and imaging studies  CBC: Recent Labs  Lab 02/10/21 1437 02/10/21 2000 02/11/21 0045  WBC 11.4* 8.8 7.7  HGB 14.8 15.5 13.1  HCT 46.2 47.3 37.0*  MCV 99.1 97.1 92.5  PLT 184 150 146*    Basic Metabolic Panel: Recent Labs  Lab 02/10/21 1437 02/10/21 2000 02/11/21 0045  NA 137  --  137  K 3.6  --  3.4*  CL 102  --  104  CO2 18*  --  20*  GLUCOSE 98  --  92  BUN 58*  --  62*  CREATININE 2.71* 2.75* 2.58*  CALCIUM 9.8  --  8.8*    GFR: Estimated Creatinine Clearance: 22.3 mL/min (A) (by C-G formula based on SCr of 2.58 mg/dL (H)).  Liver Function Tests: Recent Labs  Lab 02/10/21 1437 02/11/21 0045  AST 27 16  ALT 37 25  ALKPHOS 108 72  BILITOT 1.3* 1.0  PROT 6.4* 4.8*  ALBUMIN 2.9* 2.1*    CBG: No results for input(s): GLUCAP in the last 168 hours.  Recent Results (from the past 240 hour(s))  Resp Panel by RT-PCR (Flu A&B, Covid) Nasopharyngeal Swab     Status: None   Collection Time: 02/10/21  4:26 PM   Specimen: Nasopharyngeal Swab; Nasopharyngeal(NP) swabs in vial transport medium  Result Value Ref Range Status   SARS Coronavirus 2 by RT PCR NEGATIVE NEGATIVE Final    Comment: (NOTE) SARS-CoV-2 target nucleic acids are NOT DETECTED.  The SARS-CoV-2 RNA is generally detectable in upper respiratory specimens during the acute phase of infection. The lowest concentration of SARS-CoV-2 viral copies this assay can detect is 138 copies/mL. A negative result does not preclude SARS-Cov-2 infection and should not be used as the sole basis for treatment or other patient management decisions. A negative result may occur with  improper specimen collection/handling, submission of specimen other than nasopharyngeal  swab, presence of viral mutation(s) within the areas targeted by this assay, and inadequate number of viral copies(<138 copies/mL). A negative result must be combined with clinical observations, patient history, and epidemiological information. The expected result is Negative.  Fact Sheet for Patients:  EntrepreneurPulse.com.au  Fact Sheet for Healthcare Providers:  IncredibleEmployment.be  This test is no t yet approved or cleared by the [place] FDA and  has been authorized for detection and/or diagnosis of SARS-CoV-2 by FDA under an Emergency Use Authorization (EUA). This EUA will remain  in effect (meaning this test can be used) for the duration of the COVID-19 declaration under Section 564(b)(1) of the Act, 21 U.S.C.section 360bbb-3(b)(1), unless the authorization  is terminated  or revoked sooner.       Influenza A by PCR NEGATIVE NEGATIVE Final   Influenza B by PCR NEGATIVE NEGATIVE Final    Comment: (NOTE) The Xpert Xpress SARS-CoV-2/FLU/RSV plus assay is intended as an aid in the diagnosis of influenza from Nasopharyngeal swab specimens and should not be used as a sole basis for treatment. Nasal washings and aspirates are unacceptable for Xpert Xpress SARS-CoV-2/FLU/RSV testing.  Fact Sheet for Patients: EntrepreneurPulse.com.au  Fact Sheet for Healthcare Providers: IncredibleEmployment.be  This test is not yet approved or cleared by the [place] FDA and has been authorized for detection and/or diagnosis of SARS-CoV-2 by FDA under an Emergency Use Authorization (EUA). This EUA will remain in effect (meaning this test can be used) for the duration of the COVID-19 declaration under Section 564(b)(1) of the Act, 21 U.S.C. section 360bbb-3(b)(1), unless the authorization is terminated or revoked.  Performed at [facility], [place] [address] [place], [place] [address]   Culture,  blood (routine x 2)     Status: None (Preliminary result)   Collection Time: 02/10/21  4:35 PM   Specimen: BLOOD  Result Value Ref Range Status   Specimen Description BLOOD SITE NOT SPECIFIED  Final   Special Requests   Final    BOTTLES DRAWN AEROBIC AND ANAEROBIC Blood Culture adequate volume   Culture   Final    NO GROWTH < 24 HOURS Performed at [facility], [place] [address], [place], [place] [address]    Report Status PENDING  Incomplete  Culture, blood (routine x 2)     Status: None (Preliminary result)   Collection Time: 02/10/21  4:47 PM   Specimen: BLOOD  Result Value Ref Range Status   Specimen Description BLOOD SITE NOT SPECIFIED  Final   Special Requests   Final    BOTTLES DRAWN AEROBIC AND ANAEROBIC Blood Culture adequate volume   Culture   Final    NO GROWTH < 24 HOURS Performed at [facility], [place] [address] [place], [place] [address]    Report Status PENDING  Incomplete     Radiology Studies: DG Chest 1 View  Result Date: 02/10/2021 CLINICAL DATA:  Hypotension. EXAM: CHEST  1 VIEW COMPARISON:  January 17, 2021 FINDINGS: Prominent skin fold is seen over the right chest. The heart, hila, mediastinum, lungs, and pleura are unremarkable. IMPRESSION: No active disease. Electronically Signed   By: Dorise Bullion III M.D   On: 02/10/2021 16:00   US RENAL  Result Date: 02/11/2021 CLINICAL DATA:  Acute renal failure EXAM: RENAL / URINARY TRACT ULTRASOUND COMPLETE COMPARISON:  None. FINDINGS: Right Kidney: Renal measurements: 10.4 x 4.3 x 5.4 cm = volume: 126 mL. Increased cortical echogenicity. No hydronephrosis. Left Kidney: Renal measurements: 10.8 x 5.3 x 5.5 cm = volume: 164.1 mL. Mild hydronephrosis. Increased cortical echogenicity. Bladder: There is a masslike region with internal blood flow in the posterior bladder measuring 15.2 x 4.1 x 12.6 cm. Other: None. IMPRESSION: 1. Large mass in the posterior bladder measuring 15.2 x 4.1 x 12.6 cm with internal blood  flow. Recommend urologic consultation. 2. Mild left hydronephrosis. 3. Increased cortical echogenicity in the kidneys bilaterally suggesting medical renal disease. Electronically Signed   By: Dorise Bullion III M.D   On: 02/11/2021 10:01   Scheduled Meds: . heparin  5,000 Units Subcutaneous Q8H  . pantoprazole (PROTONIX) IV  40 mg Intravenous Q12H   Continuous Infusions: . cefTRIAXone (ROCEPHIN)  IV  1 g (02/10/21 2311)  .  sodium bicarbonate (isotonic) infusion in sterile water 125 mL/hr at 02/11/21 0754     LOS: 1 day   Time spent: 35 mins   Koury Roddy Wynetta Emery, MD How to contact the [facility] Attending or Consulting provider [place] or covering provider during after hours [employer], for this patient?  1. Check the care team in [facility] and look for a) attending/consulting [facility] provider listed and b) the [facility] team listed 2. Log into [email or web address] and use [place]'s universal password to access. If you do not have the password, please contact the hospital operator. 3. Locate the [facility] provider you are looking for under [facility] and page to a number that you can be directly reached. 4. If you still have difficulty reaching the provider, please page the [facility] (Director on Call) for the Hospitalists listed on amion for assistance.  02/11/2021, 11:47 AM

## 2021-02-12 DIAGNOSIS — Z7189 Other specified counseling: Secondary | ICD-10-CM

## 2021-02-12 DIAGNOSIS — Z515 Encounter for palliative care: Secondary | ICD-10-CM | POA: Diagnosis not present

## 2021-02-12 DIAGNOSIS — N390 Urinary tract infection, site not specified: Secondary | ICD-10-CM | POA: Diagnosis not present

## 2021-02-12 DIAGNOSIS — K219 Gastro-esophageal reflux disease without esophagitis: Secondary | ICD-10-CM | POA: Diagnosis not present

## 2021-02-12 DIAGNOSIS — E43 Unspecified severe protein-calorie malnutrition: Secondary | ICD-10-CM | POA: Insufficient documentation

## 2021-02-12 DIAGNOSIS — N179 Acute kidney failure, unspecified: Secondary | ICD-10-CM | POA: Diagnosis not present

## 2021-02-12 DIAGNOSIS — I10 Essential (primary) hypertension: Secondary | ICD-10-CM | POA: Diagnosis not present

## 2021-02-12 LAB — COMPREHENSIVE METABOLIC PANEL
ALT: 17 U/L (ref 0–44)
AST: 15 U/L (ref 15–41)
Albumin: 1.9 g/dL — ABNORMAL LOW (ref 3.5–5.0)
Alkaline Phosphatase: 55 U/L (ref 38–126)
Anion gap: 12 (ref 5–15)
BUN: 52 mg/dL — ABNORMAL HIGH (ref 8–23)
CO2: 29 mmol/L (ref 22–32)
Calcium: 8.3 mg/dL — ABNORMAL LOW (ref 8.9–10.3)
Chloride: 98 mmol/L (ref 98–111)
Creatinine, Ser: 2.17 mg/dL — ABNORMAL HIGH (ref 0.61–1.24)
GFR, Estimated: 30 mL/min — ABNORMAL LOW (ref >60)
Glucose, Bld: 84 mg/dL (ref 70–99)
Potassium: 3.1 mmol/L — ABNORMAL LOW (ref 3.5–5.1)
Sodium: 139 mmol/L (ref 135–145)
Total Bilirubin: 0.8 mg/dL (ref 0.3–1.2)
Total Protein: 4.7 g/dL — ABNORMAL LOW (ref 6.5–8.1)

## 2021-02-12 LAB — CBC
HCT: 35.5 % — ABNORMAL LOW (ref 39.0–52.0)
Hemoglobin: 11.8 g/dL — ABNORMAL LOW (ref 13.0–17.0)
MCH: 31.7 pg (ref 26.0–34.0)
MCHC: 33.2 g/dL (ref 30.0–36.0)
MCV: 95.4 fL (ref 80.0–100.0)
Platelets: 109 10*3/uL — ABNORMAL LOW (ref 150–400)
RBC: 3.72 MIL/uL — ABNORMAL LOW (ref 4.22–5.81)
RDW: 12.8 % (ref 11.5–15.5)
WBC: 7.4 10*3/uL (ref 4.0–10.5)
nRBC: 0 % (ref 0.0–0.2)

## 2021-02-12 LAB — MAGNESIUM: Magnesium: 1.7 mg/dL (ref 1.7–2.4)

## 2021-02-12 LAB — PHOSPHORUS: Phosphorus: 1.9 mg/dL — ABNORMAL LOW (ref 2.5–4.6)

## 2021-02-12 MED ORDER — ENSURE ENLIVE PO LIQD
237.0000 mL | Freq: Three times a day (TID) | ORAL | Status: DC
  Administered 2021-02-12 – 2021-02-20 (×19): 237 mL via ORAL

## 2021-02-12 MED ORDER — PANTOPRAZOLE SODIUM 40 MG PO TBEC
40.0000 mg | DELAYED_RELEASE_TABLET | Freq: Every day | ORAL | Status: DC
  Administered 2021-02-13 – 2021-02-21 (×9): 40 mg via ORAL
  Filled 2021-02-12 (×9): qty 1

## 2021-02-12 MED ORDER — POTASSIUM CHLORIDE 10 MEQ/100ML IV SOLN
10.0000 meq | INTRAVENOUS | Status: AC
  Administered 2021-02-12 (×4): 10 meq via INTRAVENOUS
  Filled 2021-02-12: qty 100

## 2021-02-12 MED ORDER — POTASSIUM CHLORIDE 10 MEQ/50ML IV SOLN: 10.0000 meq | INTRAVENOUS | Status: DC

## 2021-02-12 MED ORDER — ADULT MULTIVITAMIN W/MINERALS CH
1.0000 | ORAL_TABLET | Freq: Every day | ORAL | Status: DC
  Administered 2021-02-13 – 2021-02-21 (×9): 1 via ORAL
  Filled 2021-02-12 (×10): qty 1

## 2021-02-12 MED ORDER — MAGNESIUM SULFATE 4 GM/100ML IV SOLN
4.0000 g | Freq: Once | INTRAVENOUS | Status: AC
  Administered 2021-02-12: 4 g via INTRAVENOUS
  Filled 2021-02-12: qty 100

## 2021-02-12 NOTE — Consult Note (Signed)
Palliative Medicine Inpatient Consult Note  Reason for consult:  Goals of Care  HPI:  Per intake H&P --> 80 y.o.malewith medical history significant ofcoronary artery disease: GERD, Gilbert's syndrome, hypertension, spinal stenosis, osteoarthritis, who was recently admitted in February with AKI. Patient is back again with complaint of vomiting blood. He had nausea vomiting initially cleared but according to patient the last oneappears to be coffee-ground.  Palliative care was asked to get involved to address goals of care.  Clinical Assessment/Goals of Care:  *Please note that this is a verbal dictation therefore any spelling or grammatical errors are due to the "[place] One" system interpretation.  I have reviewed medical records including EPIC notes, labs and imaging, received report from bedside RN, assessed the patient who was lying in his bed in no apparent distress.    I met with Clinton [name] to further discuss diagnosis prognosis, GOC, EOL wishes, disposition and options.   I introduced Palliative Medicine as specialized medical care for people living with serious illness. It focuses on providing relief from the symptoms and stress of a serious illness. The goal is to improve quality of life for both the patient and the family.  Clinton [name] shares with me that he is from [place], [place] originally tough he has lived in [place], [place], and now [place]. He is a widower. His wife passed away about a year ago after suffering a stroke. 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 He is a faithful man and a member for the [employer].   Prior to hospitalization Clinton [name] had been living in apartment. He had caregivers for about 6 hours a days three days a week and his son, Clinton [name] and daughter in law would supplement the other days of the week. He requires help with all bADL's. He used a walker for mobility.  Razi states that he fell "a few weeks ago"  and since then has continued to decline. He has decreased mobility. He reviews with me that he has debilitating arthritis which makes it hard for him to do things himself. He endorses that he needs more support to help him do things. We reviewed that he is now quite immobile and reliant upon others to aid in supporting him. He is unable to support feeding himself due to generalized weakness and has had very little nutritional support. I shared my concerns for the long term inclusive of continued decline and recurrent re-hospitalizations. He emphasizes wanting continued medical care at this point.   A detailed discussion was had today regarding advanced directives - completed and in [place].    Concepts specific to code status, artifical feeding and hydration, continued IV antibiotics and rehospitalization was had.  Shawntez states that he is an organ donor and would want to be kept alive as long as possible in order to donate his organs. We reviewed that likely in the setting of his advanced age and co-morbidities that having his organs donated to other people would likely not be possible. We reviewed that donation for research purposes may be possible though he can be a DNR to pursue this. He shares with me that no, he wants cardiopulmonary resuscitation and intubation pursued. He goes on to say that he would not want to be in an incoherent state for a prolonged period of time though. We referred to his living will whereby he would wish for a trial of all treatments but if no improvement were made and the attending physician determines he  lacks the ability to communicate with capacity  he would want to had treatments withdrawn.   Aneudy right now has the goal of improving his present health state, he does not wish to discuss hospice. He is open to an OP Palliative provider following along to provide ongoing support.   Discussed the importance of continued conversation with family and their  medical providers  regarding overall plan of care and treatment options, ensuring decisions are within the context of the patients values and GOCs.  Provided "Hard Choices for Loving People" booklet.  ____________________________________________________________________ Addendum I called patients son Clinton [name], we reviewed our conversation. Clinton [name] shares that it is accurate and consistent with what he knows of his father. He does share that at some points it seems his father is more altered and not appropriate in terms of what day it is or what year we are in. He states that caring for him has been difficult on he and his wife as they share two five year olds and he has a full time job as a Software engineer.   Clinton [name] shares that he will respect his father to be full code until it is declared that his father does not have capacity for medical decision making. He agree's that DNR would be the best consideration but that Clinton [name] has not wanted this.  In terms of the long term Clinton [name] is hopeful to apply for medicaid for his father so that he may qualify to live somewhere long term. Clinton [name] states that his father cannot return home as there is not the level of support provided for him to maintain there safely. We discussed having the MSW reach out to him to provide information on how to complete the application.   Decision Maker: [name] (son) [phone]  SUMMARY OF RECOMMENDATIONS   Full Code  Patient has HCPOA and Living will on file - Pls review desire for natural death if he declines further  MSW to aid son in identifying what to fill out for medicaid  OP Palliative support will be needed on discharge  Ongoing incremental PMT support  Code Status/Advance Care Planning: FULL CODE  Palliative Prophylaxis:   Oral care, mobility  Additional Recommendations (Limitations, Scope, Preferences):  Continue full scope of treatment   Psycho-social/Spiritual:   Desire for further Chaplaincy support: No  Additional  Recommendations: Education on chronic disease processes   Prognosis: Very poor given patients chronic co-morbidities, impaired nutritional state, and muscular wasting. He is extremely chronically ill and not appears to have multi-system failure.  Discharge Planning: Patient is open to rehabilitation, will need OP palliative support  Vitals:   02/12/21 0343 02/12/21 1431  BP: (!) 92/58 (!) 96/55  Pulse: 86 75  Resp: 17 14  Temp: 98.1 F (36.7 C) 97.7 F (36.5 C)  SpO2: 92% 95%    Intake/Output Summary (Last 24 hours) at 02/12/2021 1620 Last data filed at 02/12/2021 0800 Gross per 24 hour  Intake 2403.36 ml  Output 1450 ml  Net 953.36 ml   Last Weight  Most recent update: 02/11/2021  5:53 AM   Weight  67.9 kg (149 lb 11.1 oz)           Gen:  Elderly caucasian M with flat affect HEENT: dry mucous membranes CV: Regular rate and rhythm  PULM: ON RA, clear to auscultation bilaterally  ABD: soft/nontender  EXT: (+) muscle wasting, (+) pedal edema Neuro: Alert and oriented x3   PPS: 20%   This conversation/these recommendations were discussed with patient  primary care team, Dr. Wynetta Emery  Time In: 1500 Time Out: 1630 Total Time: 90 Greater than 50%  of this time was spent counseling and coordinating care related to the above assessment and plan.  [place] Team Team Cell Phone: [phone] Please utilize secure chat with additional questions, if there is no response within 30 minutes please call the above phone number  Palliative Medicine Team providers are available by phone from 7am to 7pm daily and can be reached through the team cell phone.  Should this patient require assistance outside of these hours, please call the patient's attending physician.

## 2021-02-12 NOTE — Progress Notes (Signed)
PROGRESS NOTE   Clinton [name]  HFW:[id number] DOB: [birth year] DOA: 02/10/2021 PCP: [name], NP   Chief Complaint  Patient presents with  . Hematemesis   Level of care: Med-Surg  Brief Admission History:  80 y.o. male with medical history significant of coronary artery disease: GERD, Gilbert's syndrome, hypertension, spinal stenosis, osteoarthritis, who was recently admitted in February with AKI.  Patient is back again with complaint of vomiting blood.  He had nausea vomiting initially cleared but according to patient the last one appears to be coffee-ground.  This was not observed in the ER.  Stool guaiac is currently pending.  Hemodynamically he appears stable and hemoglobin remains stable.  Patient however was noted to be dehydrated.  He also was noted to be in acute kidney injury once again.  He denied any fever or chills.  He has apparently been living with his son and has been drinking water.  No evidence of obstructive uropathy.  Patient was in the nursing home after leaving the hospital last time but is now home where he went about a week ago.  Since arriving home he has gradually worsened and has not been doing well in the last couple of days.  Patient is being readmitted to the hospital therefore for further evaluation and treatment.   Assessment & Plan:   Principal Problem:   AKI (acute kidney injury) ([place]) Active Problems:   CAD (coronary artery disease)   HTN (hypertension)   Hyperlipidemia   GERD (gastroesophageal reflux disease)   Acute lower UTI   Hematemesis   Bladder mass  1. AKI - likely from combined chronic medical renal disease and postobstruction given urinary retention. Urology placed a foley for decompression.  Follow renal function.   2. UTI - continue IV ceftriaxone for now pending culture.  3. E coli bacteremia - IV ceftriaxone increased to 2 gm.  Repeating blood culture today.  4. Essential hypertension - with soft BPs will hold BP meds for now.   5. Pneumonia - likely aspiration, will get a SLP evaluation.   6. Chronic urinary retention with bilateral hydronephrosis  - I spoke with Dr. Alinda Money urology  CT chest/abd pelvis without findings of a mass.  Dr. Alinda Money placed a foley catheter for decompression.    DVT prophylaxis: SQ heparin  Code Status: Full  Family Communication: son Laurey Arrow 3/6, 3/7  Disposition: TBD Status is: Inpatient  Remains inpatient appropriate because:IV treatments appropriate due to intensity of illness or inability to take PO and Inpatient level of care appropriate due to severity of illness  Dispo: The patient is from: Home              Anticipated d/c is to: TBD              Patient currently is not medically stable to d/c.   Difficult to place patient No  Consultants:   Urology   Procedures:     Antimicrobials:  Ceftriaxone 3/5>>   Subjective: Pt denies pain or discomfort but does ask to drink ice water.     Objective: Vitals:   02/11/21 0500 02/11/21 1510 02/11/21 1956 02/12/21 0343  BP:  (!) 92/59 (!) 93/57 (!) 92/58  Pulse:  83 85 86  Resp:  14 17 17   Temp:  97.9 F (36.6 C) 98 F (36.7 C) 98.1 F (36.7 C)  TempSrc:   Oral Oral  SpO2:  90% 93% 92%  Weight: 67.9 kg     Height:  Intake/Output Summary (Last 24 hours) at 02/12/2021 1126 Last data filed at 02/12/2021 0601 Gross per 24 hour  Intake 2403.36 ml  Output 1450 ml  Net 953.36 ml   Filed Weights   02/10/21 2105 02/11/21 0500  Weight: 67.9 kg 67.9 kg    Examination:  General exam: elderly frail male, curled up in fetal position, mostly nonverbal, Appears calm and comfortable  Respiratory system: Clear to auscultation. Respiratory effort normal. Cardiovascular system: normal S1 & S2 heard. No JVD, murmurs, rubs, gallops or clicks. No pedal edema. Gastrointestinal system: Abdomen is nondistended, soft and nontender. No organomegaly or masses felt. Normal bowel sounds heard. GU: foley cath in place.   Central  nervous system: somnolent but arousable. No focal neurological deficits. Extremities: Symmetric 5 x 5 power. Skin: No rashes, lesions or ulcers Psychiatry: unable to assess.   Data Reviewed: I have personally reviewed following labs and imaging studies  CBC: Recent Labs  Lab 02/10/21 1437 02/10/21 2000 02/11/21 0045 02/12/21 0155  WBC 11.4* 8.8 7.7 7.4  HGB 14.8 15.5 13.1 11.8*  HCT 46.2 47.3 37.0* 35.5*  MCV 99.1 97.1 92.5 95.4  PLT 184 150 146* 109*    Basic Metabolic Panel: Recent Labs  Lab 02/10/21 1437 02/10/21 2000 02/11/21 0045 02/12/21 0155  NA 137  --  137 139  K 3.6  --  3.4* 3.1*  CL 102  --  104 98  CO2 18*  --  20* 29  GLUCOSE 98  --  92 84  BUN 58*  --  62* 52*  CREATININE 2.71* 2.75* 2.58* 2.17*  CALCIUM 9.8  --  8.8* 8.3*  MG  --   --   --  1.7  PHOS  --   --   --  1.9*    GFR: Estimated Creatinine Clearance: 26.5 mL/min (A) (by C-G formula based on SCr of 2.17 mg/dL (H)).  Liver Function Tests: Recent Labs  Lab 02/10/21 1437 02/11/21 0045 02/12/21 0155  AST 27 16 15   ALT 37 25 17  ALKPHOS 108 72 55  BILITOT 1.3* 1.0 0.8  PROT 6.4* 4.8* 4.7*  ALBUMIN 2.9* 2.1* 1.9*    CBG: No results for input(s): GLUCAP in the last 168 hours.  Recent Results (from the past 240 hour(s))  Resp Panel by RT-PCR (Flu A&B, Covid) Nasopharyngeal Swab     Status: None   Collection Time: 02/10/21  4:26 PM   Specimen: Nasopharyngeal Swab; Nasopharyngeal(NP) swabs in vial transport medium  Result Value Ref Range Status   SARS Coronavirus 2 by RT PCR NEGATIVE NEGATIVE Final    Comment: (NOTE) SARS-CoV-2 target nucleic acids are NOT DETECTED.  The SARS-CoV-2 RNA is generally detectable in upper respiratory specimens during the acute phase of infection. The lowest concentration of SARS-CoV-2 viral copies this assay can detect is 138 copies/mL. A negative result does not preclude SARS-Cov-2 infection and should not be used as the sole basis for treatment  or other patient management decisions. A negative result may occur with  improper specimen collection/handling, submission of specimen other than nasopharyngeal swab, presence of viral mutation(s) within the areas targeted by this assay, and inadequate number of viral copies(<138 copies/mL). A negative result must be combined with clinical observations, patient history, and epidemiological information. The expected result is Negative.  Fact Sheet for Patients:  EntrepreneurPulse.com.au  Fact Sheet for Healthcare Providers:  IncredibleEmployment.be  This test is no t yet approved or cleared by the [place] and  has been authorized  for detection and/or diagnosis of SARS-CoV-2 by FDA under an Emergency Use Authorization (EUA). This EUA will remain  in effect (meaning this test can be used) for the duration of the COVID-19 declaration under Section 564(b)(1) of the Act, 21 U.S.C.section 360bbb-3(b)(1), unless the authorization is terminated  or revoked sooner.       Influenza A by PCR NEGATIVE NEGATIVE Final   Influenza B by PCR NEGATIVE NEGATIVE Final    Comment: (NOTE) The Xpert Xpress SARS-CoV-2/FLU/RSV plus assay is intended as an aid in the diagnosis of influenza from Nasopharyngeal swab specimens and should not be used as a sole basis for treatment. Nasal washings and aspirates are unacceptable for Xpert Xpress SARS-CoV-2/FLU/RSV testing.  Fact Sheet for Patients: EntrepreneurPulse.com.au  Fact Sheet for Healthcare Providers: IncredibleEmployment.be  This test is not yet approved or cleared by the [place] FDA and has been authorized for detection and/or diagnosis of SARS-CoV-2 by FDA under an Emergency Use Authorization (EUA). This EUA will remain in effect (meaning this test can be used) for the duration of the COVID-19 declaration under Section 564(b)(1) of the Act, 21 U.S.C. section  360bbb-3(b)(1), unless the authorization is terminated or revoked.  Performed at [facility], [place] [address], [place], [place] [address]   Culture, blood (routine x 2)     Status: None (Preliminary result)   Collection Time: 02/10/21  4:35 PM   Specimen: BLOOD  Result Value Ref Range Status   Specimen Description BLOOD SITE NOT SPECIFIED  Final   Special Requests   Final    BOTTLES DRAWN AEROBIC AND ANAEROBIC Blood Culture adequate volume   Culture   Final    NO GROWTH < 24 HOURS Performed at [facility], [place] [address] [place], [place] [address]    Report Status PENDING  Incomplete  Culture, blood (routine x 2)     Status: None (Preliminary result)   Collection Time: 02/10/21  4:47 PM   Specimen: BLOOD  Result Value Ref Range Status   Specimen Description BLOOD SITE NOT SPECIFIED  Final   Special Requests   Final    BOTTLES DRAWN AEROBIC AND ANAEROBIC Blood Culture adequate volume   Culture  Setup Time   Final    GRAM NEGATIVE RODS ANAEROBIC BOTTLE ONLY CRITICAL RESULT CALLED TO, READ BACK BY AND VERIFIED WITH: C,PIERCE PHARMD @1403  02/11/21 EB Performed at [facility], [place] [address] [place], [place] [address]    Culture GRAM NEGATIVE RODS  Final   Report Status PENDING  Incomplete  Blood Culture ID Panel (Reflexed)     Status: Abnormal   Collection Time: 02/10/21  4:47 PM  Result Value Ref Range Status   Enterococcus faecalis NOT DETECTED NOT DETECTED Final   Enterococcus Faecium NOT DETECTED NOT DETECTED Final   Listeria monocytogenes NOT DETECTED NOT DETECTED Final   Staphylococcus species NOT DETECTED NOT DETECTED Final   Staphylococcus aureus (BCID) NOT DETECTED NOT DETECTED Final   Staphylococcus epidermidis NOT DETECTED NOT DETECTED Final   Staphylococcus lugdunensis NOT DETECTED NOT DETECTED Final   Streptococcus species NOT DETECTED NOT DETECTED Final   Streptococcus agalactiae NOT DETECTED NOT DETECTED Final   Streptococcus pneumoniae NOT  DETECTED NOT DETECTED Final   Streptococcus pyogenes NOT DETECTED NOT DETECTED Final   A.calcoaceticus-baumannii NOT DETECTED NOT DETECTED Final   Bacteroides fragilis NOT DETECTED NOT DETECTED Final   Enterobacterales DETECTED (A) NOT DETECTED Final    Comment: Enterobacterales represent a large order of gram  negative bacteria, not a single organism. CRITICAL RESULT CALLED TO, READ BACK BY AND VERIFIED WITH: C,PIERCE PHARMD @1403  02/11/21 EB    Enterobacter cloacae complex NOT DETECTED NOT DETECTED Final   Escherichia coli DETECTED (A) NOT DETECTED Final    Comment: CRITICAL RESULT CALLED TO, READ BACK BY AND VERIFIED WITH: C,PIERCE PHARMD @1403  02/11/21 EB    Klebsiella aerogenes NOT DETECTED NOT DETECTED Final   Klebsiella oxytoca NOT DETECTED NOT DETECTED Final   Klebsiella pneumoniae NOT DETECTED NOT DETECTED Final   Proteus species NOT DETECTED NOT DETECTED Final   Salmonella species NOT DETECTED NOT DETECTED Final   Serratia marcescens NOT DETECTED NOT DETECTED Final   Haemophilus influenzae NOT DETECTED NOT DETECTED Final   Neisseria meningitidis NOT DETECTED NOT DETECTED Final   Pseudomonas aeruginosa NOT DETECTED NOT DETECTED Final   Stenotrophomonas maltophilia NOT DETECTED NOT DETECTED Final   Candida albicans NOT DETECTED NOT DETECTED Final   Candida auris NOT DETECTED NOT DETECTED Final   Candida glabrata NOT DETECTED NOT DETECTED Final   Candida krusei NOT DETECTED NOT DETECTED Final   Candida parapsilosis NOT DETECTED NOT DETECTED Final   Candida tropicalis NOT DETECTED NOT DETECTED Final   Cryptococcus neoformans/gattii NOT DETECTED NOT DETECTED Final   CTX-M ESBL NOT DETECTED NOT DETECTED Final   Carbapenem resistance IMP NOT DETECTED NOT DETECTED Final   Carbapenem resistance KPC NOT DETECTED NOT DETECTED Final   Carbapenem resistance NDM NOT DETECTED NOT DETECTED Final   Carbapenem resist OXA 48 LIKE NOT DETECTED NOT DETECTED Final   Carbapenem resistance VIM NOT  DETECTED NOT DETECTED Final    Comment: Performed at [facility], [address] [place], [place] [address]  Urine culture     Status: Abnormal (Preliminary result)   Collection Time: 02/10/21  9:29 PM   Specimen: Urine, Random  Result Value Ref Range Status   Specimen Description URINE, RANDOM  Final   Special Requests NONE  Final   Culture (A)  Final    >=100,000 COLONIES/mL GRAM NEGATIVE RODS SUSCEPTIBILITIES TO FOLLOW Performed at [facility], [place] [address], [place], [place] [address]    Report Status PENDING  Incomplete     Radiology Studies: DG Chest 1 View  Result Date: 02/10/2021 CLINICAL DATA:  Hypotension. EXAM: CHEST  1 VIEW COMPARISON:  January 17, 2021 FINDINGS: Prominent skin fold is seen over the right chest. The heart, hila, mediastinum, lungs, and pleura are unremarkable. IMPRESSION: No active disease. Electronically Signed   By: Dorise Bullion III M.D   On: 02/10/2021 16:00   US RENAL  Result Date: 02/11/2021 CLINICAL DATA:  Acute renal failure EXAM: RENAL / URINARY TRACT ULTRASOUND COMPLETE COMPARISON:  None. FINDINGS: Right Kidney: Renal measurements: 10.4 x 4.3 x 5.4 cm = volume: 126 mL. Increased cortical echogenicity. No hydronephrosis. Left Kidney: Renal measurements: 10.8 x 5.3 x 5.5 cm = volume: 164.1 mL. Mild hydronephrosis. Increased cortical echogenicity. Bladder: There is a masslike region with internal blood flow in the posterior bladder measuring 15.2 x 4.1 x 12.6 cm. Other: None. IMPRESSION: 1. Large mass in the posterior bladder measuring 15.2 x 4.1 x 12.6 cm with internal blood flow. Recommend urologic consultation. 2. Mild left hydronephrosis. 3. Increased cortical echogenicity in the kidneys bilaterally suggesting medical renal disease. Electronically Signed   By: Dorise Bullion III M.D   On: 02/11/2021 10:01   CT CHEST ABDOMEN PELVIS WO CONTRAST  Result Date: 02/11/2021 CLINICAL DATA:  Acute renal injury and  findings suspicious for  bladder mass on recent ultrasound EXAM: CT CHEST, ABDOMEN AND PELVIS WITHOUT CONTRAST TECHNIQUE: Multidetector CT imaging of the chest, abdomen and pelvis was performed following the standard protocol without IV contrast. COMPARISON:  Ultrasound from earlier in the same day. FINDINGS: CT CHEST FINDINGS Cardiovascular: Somewhat limited due to lack of IV contrast. Atherosclerotic calcifications of the thoracic aorta are noted. No aneurysmal dilatation is noted. No cardiac enlargement is seen. Heavy coronary calcifications are noted. No enlargement of the pulmonary artery is seen. Mediastinum/Nodes: Thoracic inlet is within normal limits. No sizable hilar or mediastinal adenopathy is noted. Small sliding-type hiatal hernia is noted. The esophagus is otherwise within normal limits. Lungs/Pleura: Lungs are well aerated bilaterally. Bibasilar airspace opacity is noted consistent with acute infiltrate. Possibility of underlying aspiration would deserve consideration. Small right-sided pleural effusion is noted. No definitive parenchymal nodules are seen. Musculoskeletal: Degenerative changes of the thoracic spine are seen. CT ABDOMEN PELVIS FINDINGS Hepatobiliary: No focal liver abnormality is seen. No gallstones, gallbladder wall thickening, or biliary dilatation. Pancreas: Unremarkable. No pancreatic ductal dilatation or surrounding inflammatory changes. Spleen: Normal in size without focal abnormality. Adrenals/Urinary Tract: Adrenal glands are within normal limits. Kidneys demonstrate fullness of the collecting systems and ureters bilaterally consistent with a significantly distended bladder. No calculi are identified. Focal hyperdensity is noted within the midportion of the left kidney measuring 15 mm likely representing a hyperdense cyst this was not well appreciated on recent ultrasound. Bladder is significantly distended. Some mild dependent density is seen which would correspond with that seen on prior ultrasound  examination likely representing debris. No discrete mass lesion is noted. Stomach/Bowel: Diverticular change of the colon is noted without evidence of diverticulitis. No obstructive or inflammatory changes of the colon are seen. The appendix is not well visualized. No inflammatory changes are seen. The small bowel and stomach are within normal limits aside from the previously mentioned hiatal hernia. Vascular/Lymphatic: Aortic atherosclerosis. No enlarged abdominal or pelvic lymph nodes. Reproductive: Prostate is unremarkable. Other: No abdominal wall hernia or abnormality. No abdominopelvic ascites. Musculoskeletal: Postsurgical and degenerative changes of the lumbar spine are noted. Anterolisthesis of L4 with respect L5 is noted IMPRESSION: CT of the chest: Bibasilar airspace opacities most consistent with acute infiltrate. Possibility of aspiration would deserve consideration given the lower lobe predominance. CT of the abdomen and pelvis: Significant distention of the bladder with subsequent bilateral hydronephrosis. No obstructing calculi are noted. No discrete bladder mass is seen although some dependent density is noted likely representing debris. Diverticulosis without diverticulitis. Hyperdense lesion within the left kidney likely representing a proteinaceous or hemorrhagic cyst. Electronically Signed   By: Inez Catalina M.D.   On: 02/11/2021 19:24   Scheduled Meds: . heparin  5,000 Units Subcutaneous Q8H  . pantoprazole (PROTONIX) IV  40 mg Intravenous Q12H   Continuous Infusions: . cefTRIAXone (ROCEPHIN)  IV 2 g (02/12/21 0100)  . magnesium sulfate bolus IVPB    . potassium chloride    .  sodium bicarbonate (isotonic) infusion in sterile water 125 mL/hr at 02/12/21 0601     LOS: 2 days   Time spent: 35 mins   Tywan Siever Wynetta Emery, MD How to contact the [facility] Attending or Consulting provider [place] or covering provider during after hours [place], for this patient?  1. Check the care team in [facility]  and look for a) attending/consulting [facility] provider listed and b) the [facility] team listed 2. Log into [email or web address] and use [facility] universal password to  access. If you do not have the password, please contact the hospital operator. 3. Locate the [facility] provider you are looking for under [facility] and page to a number that you can be directly reached. 4. If you still have difficulty reaching the provider, please page the [facility] (Director on Call) for the Hospitalists listed on amion for assistance.  02/12/2021, 11:26 AM

## 2021-02-12 NOTE — Progress Notes (Signed)
Initial Nutrition Assessment  DOCUMENTATION CODES:  Severe malnutrition in context of acute illness/injury  INTERVENTION:  Add Ensure Enlive po TID, each supplement provides 350 kcal and 20 grams of protein.  Add Magic cup TID with meals, each supplement provides 290 kcal and 9 grams of protein.  Add MVI with minerals daily.  Monitor magnesium, potassium, and phosphorus daily for at least 3 days, MD to replete as needed.  NUTRITION DIAGNOSIS:  Severe Malnutrition related to acute illness (AKI) as evidenced by severe muscle depletion,severe fat depletion.  GOAL:  Patient will meet greater than or equal to 90% of their needs  MONITOR:  PO intake,Supplement acceptance,Labs  REASON FOR ASSESSMENT:  Consult Assessment of nutrition requirement/status  ASSESSMENT:  80 yo male with a PMH of CAD, GERD, HTN, osteoarthritis, and Gilbert's syndrome presents with hematemesis with dx of AKI from UTI. Had another AKI in February.  Visited pt at bedside. Per Epic documentation, pt ate 0% of his breakfast yesterday. He reports not eating well here and at home because he felt too sick. His nurse who was in the room at the beginning of the visit reported just ordering him a lunch tray. Increased nutrition needs for AKI.  Per Epic documentation, pt has lost 7% (11 lbs) in about 3 weeks (from 01/23/21), which is significant. Pt reports losing weight.   Recommend discontinuing Nepro, as pt has low K and Phos levels and is not indicated. Pt open to having Ensure Enlive and Magic Cups both TID.  Encouraged pt to eat what he can by mouth and drink supplements to decrease fat and muscle loss.  Relevant Medications: protonix, Mag Sulfate 100 ml, KCl 10 mEq, sodium bicarb Labs: reviewed; K 3.1, Phos 1.9  NUTRITION - FOCUSED PHYSICAL EXAM: Flowsheet Row Most Recent Value  Orbital Region Severe depletion  Upper Arm Region Severe depletion  Thoracic and Lumbar Region Moderate depletion  Buccal Region  Severe depletion  Temple Region Moderate depletion  Clavicle Bone Region Severe depletion  Clavicle and Acromion Bone Region Severe depletion  Scapular Bone Region Severe depletion  Dorsal Hand Moderate depletion  [bruising all over on both hands]  Patellar Region Severe depletion  Anterior Thigh Region Severe depletion  Posterior Calf Region Severe depletion  Edema (RD Assessment) Severe  [BLE and both feet]  Hair Reviewed  Eyes Reviewed  Mouth Reviewed  Skin Reviewed  [bruising on hands and feet]  Nails Reviewed  [pale nail beds]     Diet Order:   Diet Order            DIET DYS 3 Room service appropriate? Yes; Fluid consistency: Thin  Diet effective now                EDUCATION NEEDS:  Education needs have been addressed  Skin:  Skin Assessment: Reviewed RN Assessment  Last BM:  02/10/21 - brown smear  Height:  Ht Readings from Last 1 Encounters:  02/10/21 5\' 10"  (1.778 m)   Weight:  Wt Readings from Last 1 Encounters:  02/11/21 67.9 kg   Ideal Body Weight:  75.5 kg  BMI:  Body mass index is 21.48 kg/m.  Estimated Nutritional Needs:  Kcal:  1900-2100 Protein:  80-95 grams Fluid:  >2 L  Derrel Nip, RD, LDN Registered Dietitian After Hours/Weekend Pager # in [place]

## 2021-02-13 ENCOUNTER — Telehealth: Payer: Medicare Other | Admitting: Adult Health

## 2021-02-13 DIAGNOSIS — N390 Urinary tract infection, site not specified: Secondary | ICD-10-CM | POA: Diagnosis not present

## 2021-02-13 DIAGNOSIS — N179 Acute kidney failure, unspecified: Secondary | ICD-10-CM | POA: Diagnosis not present

## 2021-02-13 DIAGNOSIS — K219 Gastro-esophageal reflux disease without esophagitis: Secondary | ICD-10-CM | POA: Diagnosis not present

## 2021-02-13 DIAGNOSIS — I10 Essential (primary) hypertension: Secondary | ICD-10-CM | POA: Diagnosis not present

## 2021-02-13 LAB — URINE CULTURE: Culture: >=100000 — AB

## 2021-02-13 LAB — COMPREHENSIVE METABOLIC PANEL
ALT: 17 U/L (ref 0–44)
AST: 21 U/L (ref 15–41)
Albumin: 1.9 g/dL — ABNORMAL LOW (ref 3.5–5.0)
Alkaline Phosphatase: 53 U/L (ref 38–126)
Anion gap: 11 (ref 5–15)
BUN: 40 mg/dL — ABNORMAL HIGH (ref 8–23)
CO2: 30 mmol/L (ref 22–32)
Calcium: 7.8 mg/dL — ABNORMAL LOW (ref 8.9–10.3)
Chloride: 95 mmol/L — ABNORMAL LOW (ref 98–111)
Creatinine, Ser: 1.61 mg/dL — ABNORMAL HIGH (ref 0.61–1.24)
GFR, Estimated: 43 mL/min — ABNORMAL LOW (ref >60)
Glucose, Bld: 124 mg/dL — ABNORMAL HIGH (ref 70–99)
Potassium: 3 mmol/L — ABNORMAL LOW (ref 3.5–5.1)
Sodium: 136 mmol/L (ref 135–145)
Total Bilirubin: 0.7 mg/dL (ref 0.3–1.2)
Total Protein: 4.8 g/dL — ABNORMAL LOW (ref 6.5–8.1)

## 2021-02-13 LAB — CBC
HCT: 35.7 % — ABNORMAL LOW (ref 39.0–52.0)
Hemoglobin: 12.2 g/dL — ABNORMAL LOW (ref 13.0–17.0)
MCH: 32.4 pg (ref 26.0–34.0)
MCHC: 34.2 g/dL (ref 30.0–36.0)
MCV: 94.7 fL (ref 80.0–100.0)
Platelets: 112 10*3/uL — ABNORMAL LOW (ref 150–400)
RBC: 3.77 MIL/uL — ABNORMAL LOW (ref 4.22–5.81)
RDW: 12.6 % (ref 11.5–15.5)
WBC: 8.7 10*3/uL (ref 4.0–10.5)
nRBC: 0.2 % (ref 0.0–0.2)

## 2021-02-13 LAB — MAGNESIUM: Magnesium: 2.6 mg/dL — ABNORMAL HIGH (ref 1.7–2.4)

## 2021-02-13 LAB — PHOSPHORUS: Phosphorus: 1.4 mg/dL — ABNORMAL LOW (ref 2.5–4.6)

## 2021-02-13 MED ORDER — POTASSIUM CHLORIDE 10 MEQ/100ML IV SOLN
10.0000 meq | INTRAVENOUS | Status: AC
  Administered 2021-02-13 (×4): 10 meq via INTRAVENOUS
  Filled 2021-02-13 (×3): qty 100

## 2021-02-13 MED ORDER — POTASSIUM PHOSPHATES 15 MMOLE/5ML IV SOLN
20.0000 mmol | Freq: Once | INTRAVENOUS | Status: AC
  Administered 2021-02-13: 20 mmol via INTRAVENOUS
  Filled 2021-02-13: qty 6.67

## 2021-02-13 MED ORDER — CEFAZOLIN SODIUM-DEXTROSE 2-4 GM/100ML-% IV SOLN
2.0000 g | Freq: Three times a day (TID) | INTRAVENOUS | Status: DC
  Administered 2021-02-13 – 2021-02-14 (×3): 2 g via INTRAVENOUS
  Filled 2021-02-13 (×5): qty 100

## 2021-02-13 MED ORDER — CHLORHEXIDINE GLUCONATE CLOTH 2 % EX PADS
6.0000 | MEDICATED_PAD | Freq: Every day | CUTANEOUS | Status: DC
  Administered 2021-02-13 – 2021-02-21 (×9): 6 via TOPICAL

## 2021-02-13 MED ORDER — POTASSIUM CHLORIDE 10 MEQ/100ML IV SOLN
10.0000 meq | Freq: Once | INTRAVENOUS | Status: AC
  Administered 2021-02-13: 10 meq via INTRAVENOUS
  Filled 2021-02-13: qty 100

## 2021-02-13 NOTE — Progress Notes (Signed)
Patient ID: Clinton [name], male   DOB: Nov 02, 1941, 80 y.o.   MRN: [id number]    Subjective: Pt more alert and awake.  Denies pain now.  Objective: Vital signs in last 24 hours: Temp:  [97.6 F (36.4 C)-98 F (36.7 C)] 97.6 F (36.4 C) (03/08 1402) Pulse Rate:  [68-77] 68 (03/08 1402) Resp:  [18-21] 18 (03/08 0537) BP: (99-108)/(61-66) 99/64 (03/08 1402) SpO2:  [94 %-99 %] 99 % (03/08 1402) Weight:  [70.4 kg] 70.4 kg (03/08 0537)  Intake/Output from previous day: 03/07 0701 - 03/08 0700 In: 828.6 [P.O.:278; I.V.:500.6; IV Piggyback:50] Out: 1100 [Urine:1100] Intake/Output this shift: Total I/O In: 1027.8 [P.O.:360; IV Piggyback:667.8] Out: -   Physical Exam:  General: Alert and oriented Abd: Soft GU: Urine grossly clear  Lab Results: Recent Labs    02/11/21 0045 02/12/21 0155 02/13/21 0007  HGB 13.1 11.8* 12.2*  HCT 37.0* 35.5* 35.7*   BMET Recent Labs    02/12/21 0155 02/13/21 0007  NA 139 136  K 3.1* 3.0*  CL 98 95*  CO2 29 30  GLUCOSE 84 124*  BUN 52* 40*  CREATININE 2.17* 1.61*  CALCIUM 8.3* 7.8*     Studies/Results: CT CHEST ABDOMEN PELVIS WO CONTRAST  Result Date: 02/11/2021 CLINICAL DATA:  Acute renal injury and findings suspicious for bladder mass on recent ultrasound EXAM: CT CHEST, ABDOMEN AND PELVIS WITHOUT CONTRAST TECHNIQUE: Multidetector CT imaging of the chest, abdomen and pelvis was performed following the standard protocol without IV contrast. COMPARISON:  Ultrasound from earlier in the same day. FINDINGS: CT CHEST FINDINGS Cardiovascular: Somewhat limited due to lack of IV contrast. Atherosclerotic calcifications of the thoracic aorta are noted. No aneurysmal dilatation is noted. No cardiac enlargement is seen. Heavy coronary calcifications are noted. No enlargement of the pulmonary artery is seen. Mediastinum/Nodes: Thoracic inlet is within normal limits. No sizable hilar or mediastinal adenopathy is noted. Small sliding-type hiatal  hernia is noted. The esophagus is otherwise within normal limits. Lungs/Pleura: Lungs are well aerated bilaterally. Bibasilar airspace opacity is noted consistent with acute infiltrate. Possibility of underlying aspiration would deserve consideration. Small right-sided pleural effusion is noted. No definitive parenchymal nodules are seen. Musculoskeletal: Degenerative changes of the thoracic spine are seen. CT ABDOMEN PELVIS FINDINGS Hepatobiliary: No focal liver abnormality is seen. No gallstones, gallbladder wall thickening, or biliary dilatation. Pancreas: Unremarkable. No pancreatic ductal dilatation or surrounding inflammatory changes. Spleen: Normal in size without focal abnormality. Adrenals/Urinary Tract: Adrenal glands are within normal limits. Kidneys demonstrate fullness of the collecting systems and ureters bilaterally consistent with a significantly distended bladder. No calculi are identified. Focal hyperdensity is noted within the midportion of the left kidney measuring 15 mm likely representing a hyperdense cyst this was not well appreciated on recent ultrasound. Bladder is significantly distended. Some mild dependent density is seen which would correspond with that seen on prior ultrasound examination likely representing debris. No discrete mass lesion is noted. Stomach/Bowel: Diverticular change of the colon is noted without evidence of diverticulitis. No obstructive or inflammatory changes of the colon are seen. The appendix is not well visualized. No inflammatory changes are seen. The small bowel and stomach are within normal limits aside from the previously mentioned hiatal hernia. Vascular/Lymphatic: Aortic atherosclerosis. No enlarged abdominal or pelvic lymph nodes. Reproductive: Prostate is unremarkable. Other: No abdominal wall hernia or abnormality. No abdominopelvic ascites. Musculoskeletal: Postsurgical and degenerative changes of the lumbar spine are noted. Anterolisthesis of L4 with  respect L5 is noted IMPRESSION: CT of the  chest: Bibasilar airspace opacities most consistent with acute infiltrate. Possibility of aspiration would deserve consideration given the lower lobe predominance. CT of the abdomen and pelvis: Significant distention of the bladder with subsequent bilateral hydronephrosis. No obstructing calculi are noted. No discrete bladder mass is seen although some dependent density is noted likely representing debris. Diverticulosis without diverticulitis. Hyperdense lesion within the left kidney likely representing a proteinaceous or hemorrhagic cyst. Electronically Signed   By: Inez Catalina M.D.   On: 02/11/2021 19:24    Urine culture: Pansensitive E coli  Assessment/Plan: 1) Urinary retention with AKI and UTI: Continue culture specific antibiotics for 7 days for complex UTI.  Renal function improving after catheter drainage.  Would recommend leaving urethral catheter.  Considering severity and probable chronicity of retention as well as neurologic history, will likely proceed with urodynamic evaluation before catheter removal to determine safest and most effective plan.  Will arrange outpatient follow up for this to be done.   LOS: 3 days   Dutch Gray 02/13/2021, 3:40 PM

## 2021-02-13 NOTE — Care Management Important Message (Signed)
Important Message  Patient Details  Name: Clinton [name] MRN: [id number] Date of Birth: Apr 20, 1941   Medicare Important Message Given:  Yes     Orbie Pyo 02/13/2021, 3:28 PM

## 2021-02-13 NOTE — Plan of Care (Signed)
  Problem: Clinical Measurements: Goal: Ability to maintain clinical measurements within normal limits will improve Outcome: Progressing Goal: Will remain free from infection Outcome: Progressing Goal: Diagnostic test results will improve Outcome: Progressing Goal: Cardiovascular complication will be avoided Outcome: Progressing   

## 2021-02-13 NOTE — Social Work (Signed)
CSW received message that pts son [name] requested information about medicaid. CSW spoke to [place] over the son and provided background information. CSW explained the process and left handout in pts room for [name] to review. CSW also explained that medicaid takes a while to be approved and encouraged peter to think of a plan B until medicaid is approved.   Emeterio Reeve, Latanya Presser, [place] Social Worker [phone]

## 2021-02-13 NOTE — Progress Notes (Signed)
PROGRESS NOTE   Clinton [name]  MIW:[id number] DOB: [birth year] DOA: 02/10/2021 PCP: [name], NP   Chief Complaint  Patient presents with  . Hematemesis   Level of care: Med-Surg  Brief Admission History:  80 y.o. male with medical history significant of coronary artery disease: GERD, Gilbert's syndrome, hypertension, spinal stenosis, osteoarthritis, who was recently admitted in February with AKI.  Patient is back again with complaint of vomiting blood.  He had nausea vomiting initially cleared but according to patient the last one appears to be coffee-ground.  This was not observed in the ER.  Stool guaiac is currently pending.  Hemodynamically he appears stable and hemoglobin remains stable.  Patient however was noted to be dehydrated.  He also was noted to be in acute kidney injury once again.  He denied any fever or chills.  He has apparently been living with his son and has been drinking water.  No evidence of obstructive uropathy.  Patient was in the nursing home after leaving the hospital last time but is now home where he went about a week ago.  Since arriving home he has gradually worsened and has not been doing well in the last couple of days.  Patient is being readmitted to the hospital therefore for further evaluation and treatment.   Assessment & Plan:   Principal Problem:   AKI (acute kidney injury) ([place]) Active Problems:   CAD (coronary artery disease)   HTN (hypertension)   Hyperlipidemia   GERD (gastroesophageal reflux disease)   Acute lower UTI   Hematemesis   Bladder mass   Protein-calorie malnutrition, severe  1. AKI - likely from combined chronic medical renal disease and postobstruction given urinary retention. Urology placed a foley for decompression.  Follow renal function.  Improving with decompression of bladder and kidneys.   2. E coli UTI - continue IV ceftriaxone 2 gm daily.  3. E coli bacteremia - IV ceftriaxone increased to 2 gm.  Repeating blood  culture 3/7 no growth to date.  4. Essential hypertension - with soft BPs temporarily holding BP meds for now.  5. Pneumonia - likely aspiration, will get a SLP evaluation.   6. Dysphagia - appreciate SLP eval, continue dysphage 3 diet.  7. Chronic urinary retention with bilateral hydronephrosis  - I spoke with Dr. Alinda Money urology  CT chest/abd pelvis without findings of a mass.  Dr. Alinda Money placed a foley catheter for decompression.  He should discharge with foley and have outpatient follow up with urology.   DVT prophylaxis: SQ heparin  Code Status: Full  Family Communication: son Laurey Arrow 3/6, 3/7  Disposition: custodial placement  Status is: Inpatient  Remains inpatient appropriate because:IV treatments appropriate due to intensity of illness or inability to take PO and Inpatient level of care appropriate due to severity of illness  Dispo: The patient is from: Home              Anticipated d/c is to: TBD              Patient currently is not medically stable to d/c.   Difficult to place patient No  Consultants:   Urology   Procedures:     Antimicrobials:  Ceftriaxone 3/5>>   Subjective: Pt says he is feeling better today.     Objective: Vitals:   02/12/21 0343 02/12/21 1431 02/12/21 2044 02/13/21 0537  BP: (!) 92/58 (!) 96/55 102/66 108/61  Pulse: 86 75 77 75  Resp: 17 14 (!) 21 18  Temp:  98.1 F (36.7 C) 97.7 F (36.5 C) 97.7 F (36.5 C) 98 F (36.7 C)  TempSrc: Oral   Oral  SpO2: 92% 95% 94% 94%  Weight:    70.4 kg  Height:        Intake/Output Summary (Last 24 hours) at 02/13/2021 1321 Last data filed at 02/13/2021 1026 Gross per 24 hour  Intake 1068.64 ml  Output 1100 ml  Net -31.36 ml   Filed Weights   02/10/21 2105 02/11/21 0500 02/13/21 0537  Weight: 67.9 kg 67.9 kg 70.4 kg    Examination:  General exam: elderly frail male, curled up in fetal position, Appears chronically ill, calm and no apparent distress.   Respiratory system: Clear to auscultation.  Respiratory effort normal. Cardiovascular system: normal S1 & S2 heard. No JVD, murmurs, rubs, gallops or clicks. No pedal edema. Gastrointestinal system: Abdomen is nondistended, soft and nontender. No organomegaly or masses felt. Normal bowel sounds heard. GU: foley cath in place.   Central nervous system: No focal neurological deficits. Extremities: no gross lesions seen. Warm extremities upper and lower.  Skin: No rashes, lesions or ulcers Psychiatry: normal affect today.    Data Reviewed: I have personally reviewed following labs and imaging studies  CBC: Recent Labs  Lab 02/10/21 1437 02/10/21 2000 02/11/21 0045 02/12/21 0155 02/13/21 0007  WBC 11.4* 8.8 7.7 7.4 8.7  HGB 14.8 15.5 13.1 11.8* 12.2*  HCT 46.2 47.3 37.0* 35.5* 35.7*  MCV 99.1 97.1 92.5 95.4 94.7  PLT 184 150 146* 109* 112*    Basic Metabolic Panel: Recent Labs  Lab 02/10/21 1437 02/10/21 2000 02/11/21 0045 02/12/21 0155 02/13/21 0007  NA 137  --  137 139 136  K 3.6  --  3.4* 3.1* 3.0*  CL 102  --  104 98 95*  CO2 18*  --  20* 29 30  GLUCOSE 98  --  92 84 124*  BUN 58*  --  62* 52* 40*  CREATININE 2.71* 2.75* 2.58* 2.17* 1.61*  CALCIUM 9.8  --  8.8* 8.3* 7.8*  MG  --   --   --  1.7 2.6*  PHOS  --   --   --  1.9* 1.4*    GFR: Estimated Creatinine Clearance: 37 mL/min (A) (by C-G formula based on SCr of 1.61 mg/dL (H)).  Liver Function Tests: Recent Labs  Lab 02/10/21 1437 02/11/21 0045 02/12/21 0155 02/13/21 0007  AST 27 16 15 21   ALT 37 25 17 17   ALKPHOS 108 72 55 53  BILITOT 1.3* 1.0 0.8 0.7  PROT 6.4* 4.8* 4.7* 4.8*  ALBUMIN 2.9* 2.1* 1.9* 1.9*    CBG: No results for input(s): GLUCAP in the last 168 hours.  Recent Results (from the past 240 hour(s))  Resp Panel by RT-PCR (Flu A&B, Covid) Nasopharyngeal Swab     Status: None   Collection Time: 02/10/21  4:26 PM   Specimen: Nasopharyngeal Swab; Nasopharyngeal(NP) swabs in vial transport medium  Result Value Ref Range Status    SARS Coronavirus 2 by RT PCR NEGATIVE NEGATIVE Final    Comment: (NOTE) SARS-CoV-2 target nucleic acids are NOT DETECTED.  The SARS-CoV-2 RNA is generally detectable in upper respiratory specimens during the acute phase of infection. The lowest concentration of SARS-CoV-2 viral copies this assay can detect is 138 copies/mL. A negative result does not preclude SARS-Cov-2 infection and should not be used as the sole basis for treatment or other patient management decisions. A negative result may occur with  improper  specimen collection/handling, submission of specimen other than nasopharyngeal swab, presence of viral mutation(s) within the areas targeted by this assay, and inadequate number of viral copies(<138 copies/mL). A negative result must be combined with clinical observations, patient history, and epidemiological information. The expected result is Negative.  Fact Sheet for Patients:  EntrepreneurPulse.com.au  Fact Sheet for Healthcare Providers:  IncredibleEmployment.be  This test is no t yet approved or cleared by the [place] FDA and  has been authorized for detection and/or diagnosis of SARS-CoV-2 by FDA under an Emergency Use Authorization (EUA). This EUA will remain  in effect (meaning this test can be used) for the duration of the COVID-19 declaration under Section 564(b)(1) of the Act, 21 U.S.C.section 360bbb-3(b)(1), unless the authorization is terminated  or revoked sooner.       Influenza A by PCR NEGATIVE NEGATIVE Final   Influenza B by PCR NEGATIVE NEGATIVE Final    Comment: (NOTE) The Xpert Xpress SARS-CoV-2/FLU/RSV plus assay is intended as an aid in the diagnosis of influenza from Nasopharyngeal swab specimens and should not be used as a sole basis for treatment. Nasal washings and aspirates are unacceptable for Xpert Xpress SARS-CoV-2/FLU/RSV testing.  Fact Sheet for  Patients: EntrepreneurPulse.com.au  Fact Sheet for Healthcare Providers: IncredibleEmployment.be  This test is not yet approved or cleared by the [place] FDA and has been authorized for detection and/or diagnosis of SARS-CoV-2 by FDA under an Emergency Use Authorization (EUA). This EUA will remain in effect (meaning this test can be used) for the duration of the COVID-19 declaration under Section 564(b)(1) of the Act, 21 U.S.C. section 360bbb-3(b)(1), unless the authorization is terminated or revoked.  Performed at [facility], [place] [address] [place], [place] [address]   Culture, blood (routine x 2)     Status: None (Preliminary result)   Collection Time: 02/10/21  4:35 PM   Specimen: BLOOD  Result Value Ref Range Status   Specimen Description BLOOD SITE NOT SPECIFIED  Final   Special Requests   Final    BOTTLES DRAWN AEROBIC AND ANAEROBIC Blood Culture adequate volume   Culture   Final    NO GROWTH 2 DAYS Performed at [facility], [address], [place], [place] [address]    Report Status PENDING  Incomplete  Culture, blood (routine x 2)     Status: Abnormal (Preliminary result)   Collection Time: 02/10/21  4:47 PM   Specimen: BLOOD  Result Value Ref Range Status   Specimen Description BLOOD SITE NOT SPECIFIED  Final   Special Requests   Final    BOTTLES DRAWN AEROBIC AND ANAEROBIC Blood Culture adequate volume   Culture  Setup Time   Final    GRAM NEGATIVE RODS ANAEROBIC BOTTLE ONLY CRITICAL RESULT CALLED TO, READ BACK BY AND VERIFIED WITH: C,PIERCE PHARMD @1403  02/11/21 EB Performed at [facility], [place] [address] [place], [place] [address]    Culture ESCHERICHIA COLI (A)  Final   Report Status PENDING  Incomplete   Organism ID, Bacteria ESCHERICHIA COLI  Final      Susceptibility   Escherichia coli - MIC*    AMPICILLIN 4 SENSITIVE Sensitive     CEFAZOLIN <=4 SENSITIVE Sensitive     CEFEPIME <=0.12  SENSITIVE Sensitive     CEFTAZIDIME <=1 SENSITIVE Sensitive     CEFTRIAXONE <=0.25 SENSITIVE Sensitive     CIPROFLOXACIN <=0.25 SENSITIVE Sensitive     GENTAMICIN <=1 SENSITIVE Sensitive     IMIPENEM <=0.25  SENSITIVE Sensitive     TRIMETH/SULFA <=20 SENSITIVE Sensitive     AMPICILLIN/SULBACTAM 4 SENSITIVE Sensitive     PIP/TAZO <=4 SENSITIVE Sensitive     * ESCHERICHIA COLI  Blood Culture ID Panel (Reflexed)     Status: Abnormal   Collection Time: 02/10/21  4:47 PM  Result Value Ref Range Status   Enterococcus faecalis NOT DETECTED NOT DETECTED Final   Enterococcus Faecium NOT DETECTED NOT DETECTED Final   Listeria monocytogenes NOT DETECTED NOT DETECTED Final   Staphylococcus species NOT DETECTED NOT DETECTED Final   Staphylococcus aureus (BCID) NOT DETECTED NOT DETECTED Final   Staphylococcus epidermidis NOT DETECTED NOT DETECTED Final   Staphylococcus lugdunensis NOT DETECTED NOT DETECTED Final   Streptococcus species NOT DETECTED NOT DETECTED Final   Streptococcus agalactiae NOT DETECTED NOT DETECTED Final   Streptococcus pneumoniae NOT DETECTED NOT DETECTED Final   Streptococcus pyogenes NOT DETECTED NOT DETECTED Final   A.calcoaceticus-baumannii NOT DETECTED NOT DETECTED Final   Bacteroides fragilis NOT DETECTED NOT DETECTED Final   Enterobacterales DETECTED (A) NOT DETECTED Final    Comment: Enterobacterales represent a large order of gram negative bacteria, not a single organism. CRITICAL RESULT CALLED TO, READ BACK BY AND VERIFIED WITH: C,PIERCE PHARMD @1403  02/11/21 EB    Enterobacter cloacae complex NOT DETECTED NOT DETECTED Final   Escherichia coli DETECTED (A) NOT DETECTED Final    Comment: CRITICAL RESULT CALLED TO, READ BACK BY AND VERIFIED WITH: C,PIERCE PHARMD @1403  02/11/21 EB    Klebsiella aerogenes NOT DETECTED NOT DETECTED Final   Klebsiella oxytoca NOT DETECTED NOT DETECTED Final   Klebsiella pneumoniae NOT DETECTED NOT DETECTED Final   Proteus species NOT  DETECTED NOT DETECTED Final   Salmonella species NOT DETECTED NOT DETECTED Final   Serratia marcescens NOT DETECTED NOT DETECTED Final   Haemophilus influenzae NOT DETECTED NOT DETECTED Final   Neisseria meningitidis NOT DETECTED NOT DETECTED Final   Pseudomonas aeruginosa NOT DETECTED NOT DETECTED Final   Stenotrophomonas maltophilia NOT DETECTED NOT DETECTED Final   Candida albicans NOT DETECTED NOT DETECTED Final   Candida auris NOT DETECTED NOT DETECTED Final   Candida glabrata NOT DETECTED NOT DETECTED Final   Candida krusei NOT DETECTED NOT DETECTED Final   Candida parapsilosis NOT DETECTED NOT DETECTED Final   Candida tropicalis NOT DETECTED NOT DETECTED Final   Cryptococcus neoformans/gattii NOT DETECTED NOT DETECTED Final   CTX-M ESBL NOT DETECTED NOT DETECTED Final   Carbapenem resistance IMP NOT DETECTED NOT DETECTED Final   Carbapenem resistance KPC NOT DETECTED NOT DETECTED Final   Carbapenem resistance NDM NOT DETECTED NOT DETECTED Final   Carbapenem resist OXA 48 LIKE NOT DETECTED NOT DETECTED Final   Carbapenem resistance VIM NOT DETECTED NOT DETECTED Final    Comment: Performed at [facility], [address], [place], [place] [address]  Urine culture     Status: Abnormal   Collection Time: 02/10/21  9:29 PM   Specimen: Urine, Random  Result Value Ref Range Status   Specimen Description URINE, RANDOM  Final   Special Requests   Final    NONE Performed at [facility], [place] [address], [place], [place] [address]    Culture >=100,000 COLONIES/mL ESCHERICHIA COLI (A)  Final   Report Status 02/13/2021 FINAL  Final   Organism ID, Bacteria ESCHERICHIA COLI (A)  Final      Susceptibility   Escherichia coli - MIC*    AMPICILLIN 8 SENSITIVE Sensitive  CEFAZOLIN <=4 SENSITIVE Sensitive     CEFEPIME <=0.12 SENSITIVE Sensitive     CEFTRIAXONE <=0.25 SENSITIVE Sensitive     CIPROFLOXACIN <=0.25 SENSITIVE Sensitive     GENTAMICIN <=1 SENSITIVE Sensitive      IMIPENEM <=0.25 SENSITIVE Sensitive     NITROFURANTOIN <=16 SENSITIVE Sensitive     TRIMETH/SULFA <=20 SENSITIVE Sensitive     AMPICILLIN/SULBACTAM 4 SENSITIVE Sensitive     PIP/TAZO <=4 SENSITIVE Sensitive     * >=100,000 COLONIES/mL ESCHERICHIA COLI  Culture, blood (Routine X 2) w Reflex to ID Panel     Status: None (Preliminary result)   Collection Time: 02/12/21 12:22 PM   Specimen: BLOOD RIGHT ARM  Result Value Ref Range Status   Specimen Description BLOOD RIGHT ARM  Final   Special Requests   Final    BOTTLES DRAWN AEROBIC AND ANAEROBIC Blood Culture adequate volume Performed at [facility], [place] [address] [place], [place] [address]    Culture PENDING  Incomplete   Report Status PENDING  Incomplete     Radiology Studies: CT CHEST ABDOMEN PELVIS WO CONTRAST  Result Date: 02/11/2021 CLINICAL DATA:  Acute renal injury and findings suspicious for bladder mass on recent ultrasound EXAM: CT CHEST, ABDOMEN AND PELVIS WITHOUT CONTRAST TECHNIQUE: Multidetector CT imaging of the chest, abdomen and pelvis was performed following the standard protocol without IV contrast. COMPARISON:  Ultrasound from earlier in the same day. FINDINGS: CT CHEST FINDINGS Cardiovascular: Somewhat limited due to lack of IV contrast. Atherosclerotic calcifications of the thoracic aorta are noted. No aneurysmal dilatation is noted. No cardiac enlargement is seen. Heavy coronary calcifications are noted. No enlargement of the pulmonary artery is seen. Mediastinum/Nodes: Thoracic inlet is within normal limits. No sizable hilar or mediastinal adenopathy is noted. Small sliding-type hiatal hernia is noted. The esophagus is otherwise within normal limits. Lungs/Pleura: Lungs are well aerated bilaterally. Bibasilar airspace opacity is noted consistent with acute infiltrate. Possibility of underlying aspiration would deserve consideration. Small right-sided pleural effusion is noted. No definitive parenchymal nodules are  seen. Musculoskeletal: Degenerative changes of the thoracic spine are seen. CT ABDOMEN PELVIS FINDINGS Hepatobiliary: No focal liver abnormality is seen. No gallstones, gallbladder wall thickening, or biliary dilatation. Pancreas: Unremarkable. No pancreatic ductal dilatation or surrounding inflammatory changes. Spleen: Normal in size without focal abnormality. Adrenals/Urinary Tract: Adrenal glands are within normal limits. Kidneys demonstrate fullness of the collecting systems and ureters bilaterally consistent with a significantly distended bladder. No calculi are identified. Focal hyperdensity is noted within the midportion of the left kidney measuring 15 mm likely representing a hyperdense cyst this was not well appreciated on recent ultrasound. Bladder is significantly distended. Some mild dependent density is seen which would correspond with that seen on prior ultrasound examination likely representing debris. No discrete mass lesion is noted. Stomach/Bowel: Diverticular change of the colon is noted without evidence of diverticulitis. No obstructive or inflammatory changes of the colon are seen. The appendix is not well visualized. No inflammatory changes are seen. The small bowel and stomach are within normal limits aside from the previously mentioned hiatal hernia. Vascular/Lymphatic: Aortic atherosclerosis. No enlarged abdominal or pelvic lymph nodes. Reproductive: Prostate is unremarkable. Other: No abdominal wall hernia or abnormality. No abdominopelvic ascites. Musculoskeletal: Postsurgical and degenerative changes of the lumbar spine are noted. Anterolisthesis of L4 with respect L5 is noted IMPRESSION: CT of the chest: Bibasilar airspace opacities most consistent with acute infiltrate. Possibility of aspiration would deserve consideration given the lower lobe predominance. CT of  the abdomen and pelvis: Significant distention of the bladder with subsequent bilateral hydronephrosis. No obstructing  calculi are noted. No discrete bladder mass is seen although some dependent density is noted likely representing debris. Diverticulosis without diverticulitis. Hyperdense lesion within the left kidney likely representing a proteinaceous or hemorrhagic cyst. Electronically Signed   By: Inez Catalina M.D.   On: 02/11/2021 19:24   Scheduled Meds: . Chlorhexidine Gluconate Cloth  6 each Topical Daily  . feeding supplement  237 mL Oral TID BM  . heparin  5,000 Units Subcutaneous Q8H  . multivitamin with minerals  1 tablet Oral Daily  . pantoprazole  40 mg Oral Q0600   Continuous Infusions: . cefTRIAXone (ROCEPHIN)  IV 2 g (02/12/21 2224)  . potassium PHOSPHATE IVPB (in mmol) 20 mmol (02/13/21 1009)     LOS: 3 days   Time spent: 35 mins   Mirca Yale Wynetta Emery, MD How to contact the [facility] Attending or Consulting provider [place] or covering provider during after hours [place], for this patient?  1. Check the care team in [facility] and look for a) attending/consulting [facility] provider listed and b) the [facility] team listed 2. Log into [email or web address] and use [place] universal password to access. If you do not have the password, please contact the hospital operator. 3. Locate the [facility] provider you are looking for under [facility] and page to a number that you can be directly reached. 4. If you still have difficulty reaching the provider, please page the [facility] (Director on Call) for the Hospitalists listed on amion for assistance.  02/13/2021, 1:21 PM

## 2021-02-13 NOTE — Evaluation (Signed)
Clinical/Bedside Swallow Evaluation Patient Details  Name: Clinton [name] MRN: [id number] Date of Birth: October 19, 1941  Today's Date: 02/13/2021 Time: SLP Start Time (ACUTE ONLY): 0908 SLP Stop Time (ACUTE ONLY): 0930 SLP Time Calculation (min) (ACUTE ONLY): 22 min  Past Medical History:  Past Medical History:  Diagnosis Date  . Anxiety   . Arthritis   . Coronary artery disease    have an appointment with Dr Johnsie Cancel 11/15/14  . Erectile dysfunction   . Fall 01/17/2021  . Frozen shoulder   . GERD (gastroesophageal reflux disease)   . Guillain Barr syndrome ([place]) 2009   Following the H1 N1 flu vaccine  . History of elevated lipids   . Hypertension   . Osteoarthritis   . Spinal stenosis 11/24/2014   Past Surgical History:  Past Surgical History:  Procedure Laterality Date  . CARDIAC SURGERY     2 stents placed   . CORONARY ANGIOPLASTY     mid and proximal LAD stent 09/1999 [facility])  . posterior lumbar fusion L4-5  11-24-14   HPI:  80 y.o. male with medical history significant of coronary artery disease: GERD, Gilbert's syndrome, hypertension, spinal stenosis, osteoarthritis, who was recently admitted in February with AKI.  Patient is back again with complaint of vomiting blood.  He had nausea vomiting initially cleared but according to patient the last one appears to be coffee-ground. CT of chest with bibasilar airspace opacities most consistent with acute infiltrate. Possibility of aspiration would deserve consideration given the lower lobe predominance.   Assessment / Plan / Recommendation Clinical Impression  Pt presents with a mild oral and a suspected component of pharyngeal dysphagia. Pt appears very deconditioned. Vocal quality noted to be hoarse with decreased vocal intensity. Xerostomia noted, improving post oral care. Pt with few dentition in poor condition and generalized weakness of oral musculature. Pt with weakened bolus manipulation and delayed AP transit  across boluses. No palpable swallow exhibited with a small ice chip. Pt unable to make labial seal with thins via cup sip. Pt reports hx of arthritis, noted with left sided head tilt, unable to achieve neutral head positioning due to arthritis. Pt able to syphoon thins via straw. Swallow initiation appeared delayed with suspected decreased laryngeal elevation per palpation. Single throat clear exhibited. No overt coughing exhibited. Note CT of chest with concern for lower lobe opacities and cannot exclude aspiration etiology. Recommend dysphagia 3 (mechanical soft) and thin liquids with meds whole in puree. Instrumental assessment may be indicated if overt difficulty is noted, silent aspiration cannot be excluded. Palliative following. Will continue to monitor for pt goals of care.  SLP Visit Diagnosis: Dysphagia, oral phase (R13.11);Dysphagia, unspecified (R13.10)    Aspiration Risk  Mild aspiration risk;Moderate aspiration risk;Risk for inadequate nutrition/hydration    Diet Recommendation   Dysphagia 3 (mechanical soft) thin liquids   Medication Administration: Whole meds with puree    Other  Recommendations Oral Care Recommendations: Oral care BID   Follow up Recommendations 24 hour supervision/assistance;Skilled Nursing facility      Frequency and Duration min 1 x/week  1 week       Prognosis Prognosis for Safe Diet Advancement: Fair Barriers to Reach Goals: Time post onset      Swallow Study   General Date of Onset: 02/10/21 HPI: 80 y.o. male with medical history significant of coronary artery disease: GERD, Gilbert's syndrome, hypertension, spinal stenosis, osteoarthritis, who was recently admitted in February with AKI.  Patient is back again with complaint of  vomiting blood.  He had nausea vomiting initially cleared but according to patient the last one appears to be coffee-ground. CT of chest with bibasilar airspace opacities most consistent with acute infiltrate. Possibility of  aspiration would deserve consideration given the lower lobe predominance. Type of Study: Bedside Swallow Evaluation Previous Swallow Assessment: none on file Diet Prior to this Study: Dysphagia 3 (soft);Thin liquids Temperature Spikes Noted: No Respiratory Status: Room air History of Recent Intubation: No Behavior/Cognition: Alert;Requires cueing Oral Cavity Assessment: Dry Oral Care Completed by SLP: Yes Oral Cavity - Dentition: Missing dentition;Poor condition Vision: Functional for self-feeding Self-Feeding Abilities: Needs assist Patient Positioning: Upright in bed Baseline Vocal Quality: Hoarse;Low vocal intensity Volitional Cough: Cognitively unable to elicit Volitional Swallow: Unable to elicit    Oral/Motor/Sensory Function Overall Oral Motor/Sensory Function: Generalized oral weakness   Ice Chips Ice chips: Impaired Presentation: Spoon Oral Phase Impairments: Reduced labial seal;Reduced lingual movement/coordination;Impaired mastication Oral Phase Functional Implications: Prolonged oral transit Pharyngeal Phase Impairments: Unable to trigger swallow   Thin Liquid Thin Liquid: Impaired Presentation: Cup;Straw Oral Phase Impairments: Reduced labial seal;Reduced lingual movement/coordination Oral Phase Functional Implications: Prolonged oral transit Pharyngeal  Phase Impairments: Suspected delayed Swallow;Decreased hyoid-laryngeal movement;Multiple swallows;Throat Clearing - Immediate    Nectar Thick Nectar Thick Liquid: Not tested   Honey Thick Honey Thick Liquid: Not tested   Puree Puree: Impaired Presentation: Spoon Oral Phase Impairments: Reduced lingual movement/coordination Oral Phase Functional Implications: Prolonged oral transit Pharyngeal Phase Impairments: Suspected delayed Swallow;Multiple swallows;Decreased hyoid-laryngeal movement   Solid     Solid: Impaired Presentation: Self Fed Oral Phase Impairments: Reduced lingual movement/coordination;Impaired  mastication Oral Phase Functional Implications: Impaired mastication;Prolonged oral transit;Oral residue Pharyngeal Phase Impairments: Suspected delayed Swallow;Multiple swallows      Kortnie Stovall H. MA, CCC-SLP Acute Rehabilitation Services   02/13/2021,9:37 AM

## 2021-02-14 ENCOUNTER — Telehealth: Payer: Medicare Other | Admitting: Adult Health

## 2021-02-14 DIAGNOSIS — E43 Unspecified severe protein-calorie malnutrition: Secondary | ICD-10-CM

## 2021-02-14 DIAGNOSIS — N179 Acute kidney failure, unspecified: Secondary | ICD-10-CM | POA: Diagnosis not present

## 2021-02-14 DIAGNOSIS — N390 Urinary tract infection, site not specified: Secondary | ICD-10-CM | POA: Diagnosis not present

## 2021-02-14 LAB — COMPREHENSIVE METABOLIC PANEL
ALT: 15 U/L (ref 0–44)
AST: 21 U/L (ref 15–41)
Albumin: 1.7 g/dL — ABNORMAL LOW (ref 3.5–5.0)
Alkaline Phosphatase: 46 U/L (ref 38–126)
Anion gap: 9 (ref 5–15)
BUN: 28 mg/dL — ABNORMAL HIGH (ref 8–23)
CO2: 32 mmol/L (ref 22–32)
Calcium: 7.4 mg/dL — ABNORMAL LOW (ref 8.9–10.3)
Chloride: 94 mmol/L — ABNORMAL LOW (ref 98–111)
Creatinine, Ser: 1.33 mg/dL — ABNORMAL HIGH (ref 0.61–1.24)
GFR, Estimated: 54 mL/min — ABNORMAL LOW (ref >60)
Glucose, Bld: 99 mg/dL (ref 70–99)
Potassium: 3.3 mmol/L — ABNORMAL LOW (ref 3.5–5.1)
Sodium: 135 mmol/L (ref 135–145)
Total Bilirubin: 0.6 mg/dL (ref 0.3–1.2)
Total Protein: 4.4 g/dL — ABNORMAL LOW (ref 6.5–8.1)

## 2021-02-14 LAB — CBC
HCT: 32 % — ABNORMAL LOW (ref 39.0–52.0)
Hemoglobin: 11.1 g/dL — ABNORMAL LOW (ref 13.0–17.0)
MCH: 32.7 pg (ref 26.0–34.0)
MCHC: 34.7 g/dL (ref 30.0–36.0)
MCV: 94.4 fL (ref 80.0–100.0)
Platelets: 119 10*3/uL — ABNORMAL LOW (ref 150–400)
RBC: 3.39 MIL/uL — ABNORMAL LOW (ref 4.22–5.81)
RDW: 12.7 % (ref 11.5–15.5)
WBC: 6.3 10*3/uL (ref 4.0–10.5)
nRBC: 0 % (ref 0.0–0.2)

## 2021-02-14 LAB — CULTURE, BLOOD (ROUTINE X 2): Special Requests: ADEQUATE

## 2021-02-14 LAB — MAGNESIUM: Magnesium: 2.2 mg/dL (ref 1.7–2.4)

## 2021-02-14 LAB — PHOSPHORUS: Phosphorus: 1.8 mg/dL — ABNORMAL LOW (ref 2.5–4.6)

## 2021-02-14 MED ORDER — SODIUM CHLORIDE 0.9 % IV SOLN
3.0000 g | Freq: Three times a day (TID) | INTRAVENOUS | Status: DC
  Administered 2021-02-14 – 2021-02-18 (×13): 3 g via INTRAVENOUS
  Filled 2021-02-14 (×2): qty 3
  Filled 2021-02-14: qty 8
  Filled 2021-02-14 (×2): qty 3
  Filled 2021-02-14 (×4): qty 8
  Filled 2021-02-14 (×2): qty 3
  Filled 2021-02-14 (×3): qty 8
  Filled 2021-02-14 (×3): qty 3

## 2021-02-14 MED ORDER — POTASSIUM PHOSPHATES 15 MMOLE/5ML IV SOLN
20.0000 mmol | Freq: Once | INTRAVENOUS | Status: AC
  Administered 2021-02-14: 20 mmol via INTRAVENOUS
  Filled 2021-02-14: qty 6.67

## 2021-02-14 NOTE — Evaluation (Signed)
Physical Therapy Evaluation Patient Details Name: Clinton [name] MRN: [id number] DOB: 1941/03/16 Today's Date: 02/14/2021   History of Present Illness  80 yo male presents to ED on 3/5 with coffee ground emesis, abdominal pain, UTI, aspiration PNA. Renal US shows large posterior bladder mass and mild L hydronephrosis, per urology suspect due to debri from severe infection and not a bladder mass. Pt with recent SNF admission, d/c 3/1. Pt with recent admission 01/2021 for AKI, falls at home. PMH includes coronary artery disease, GERD, Gilbert's syndrome, hypertension, spinal stenosis, osteoarthritis.  Clinical Impression   Pt presents with severe debility, impaired sitting balance, impaired UE and LE ROM, inability to stand, and decreased activity tolerance. Pt to benefit from acute PT to address deficits. Pt currently requiring max +1-2 for bed mobility and EOB tasks, unable to progress to standing due to pt fatigue from EOB sitting and pt requesting a nap. PT recommends return to SNF to address mobility deficits; pt lives alone at baseline. PT to progress mobility as tolerated, and will continue to follow acutely.      Follow Up Recommendations SNF;Supervision/Assistance - 24 hour    Equipment Recommendations  None recommended by PT    Recommendations for Other Services       Precautions / Restrictions Precautions Precautions: Fall Restrictions Weight Bearing Restrictions: No      Mobility  Bed Mobility Overal bed mobility: Needs Assistance Bed Mobility: Supine to Sit;Sit to Supine     Supine to sit: Max assist;HOB elevated Sit to supine: Max assist;HOB elevated   General bed mobility comments: max assist for trunk and LE management, pt reaching with UEs and pushing off of bed to assist as able but very limited ability. Verbal cuing for sequencing task.    Transfers                 General transfer comment: unable  Ambulation/Gait                Stairs             Wheelchair Mobility    Modified Rankin (Stroke Patients Only)       Balance Overall balance assessment: Needs assistance;History of Falls Sitting-balance support: Feet supported;Bilateral upper extremity supported Sitting balance-Leahy Scale: Fair Sitting balance - Comments: heavy sacral sitting and posterior leaning when sitting unsupported, unsupported sitting tolerated x10 seconds. EOB sitting x10 minutes Postural control: Posterior lean Standing balance support: During functional activity;Bilateral upper extremity supported Standing balance-Leahy Scale: Zero                               Pertinent Vitals/Pain Pain Assessment: Faces Faces Pain Scale: Hurts little more Pain Location: generalized Pain Descriptors / Indicators: Grimacing;Discomfort Pain Intervention(s): Limited activity within patient's tolerance;Monitored during session;Repositioned    Home Living Family/patient expects to be discharged to:: Private residence Living Arrangements: Alone Available Help at Discharge: Family;Personal care attendant;Available PRN/intermittently Type of Home: Apartment Home Access: Stairs to enter   Entrance Stairs-Number of Steps: 1 (threshold) Home Layout: One level Home Equipment: Clinical cytogeneticist - 2 wheels;Hand held shower head;Other (comment) (Life Alert) Additional Comments: Aide comes 3 days/week for 2 hours    Prior Function Level of Independence: Needs assistance   Gait / Transfers Assistance Needed: Uses RW for mobility  ADL's / Homemaking Assistance Needed: Previous admission PT eval note: "Pt reports aide helps with cleaning and with showering. Pt reports he can get dressed  on his own, but takes him alot longer." This admission, pt reports he did not get OOB that he can remember from 3/1-3/5        Hand Dominance   Dominant Hand: Left    Extremity/Trunk Assessment   Upper Extremity Assessment Upper Extremity Assessment: RUE  deficits/detail;LUE deficits/detail RUE Deficits / Details: edematous UEs, able to perform elbow extension and flexion lacking ~5 degrees terminal extension. Pt holds UEs in shoulder elevation, shoulder IR, and elbow flexion. Discoloration in bilateral UEs distal>proximal LUE Deficits / Details: see above    Lower Extremity Assessment Lower Extremity Assessment: Generalized weakness RLE Deficits / Details: wind-swept appearance of LEs to L; able to reach ~0 knee extension, ~heel slide to 50 deg knee flexion with manual PT assist. DF lacking 10* LLE Deficits / Details: wind-swept appearance of LEs to L; able to reach ~0 knee extension, ~heel slide to 50 deg knee flexion with manual PT assist. DF lacking 10*    Cervical / Trunk Assessment Cervical / Trunk Assessment: Kyphotic;Other exceptions Cervical / Trunk Exceptions: R cervical rotation and lateral flexion preference, able to reach midline with cuing and manual PT assist  Communication   Communication: No difficulties  Cognition Arousal/Alertness: Awake/alert Behavior During Therapy: Flat affect Overall Cognitive Status: No family/caregiver present to determine baseline cognitive functioning Area of Impairment: Orientation;Memory;Following commands;Safety/judgement;Problem solving                 Orientation Level: Disoriented to;Situation;Time   Memory: Decreased short-term memory Following Commands: Follows one step commands with increased time Safety/Judgement: Decreased awareness of safety;Decreased awareness of deficits   Problem Solving: Difficulty sequencing;Requires verbal cues;Requires tactile cues;Slow processing;Decreased initiation General Comments: pt with very flat affect, poor understanding of timeline of events that led pt to this hospitalization. Pt states that his wife died in 03/27/2000, but died in 03/27/20 as he lost her ~1 year ago. Pt is a poor historian, unable to state mobility status PTA      General Comments       Exercises     Assessment/Plan    PT Assessment Patient needs continued PT services  PT Problem List Decreased strength;Decreased balance;Decreased activity tolerance;Decreased mobility;Decreased cognition;Decreased safety awareness;Decreased knowledge of use of DME;Decreased knowledge of precautions;Decreased range of motion;Decreased coordination;Impaired tone;Pain       PT Treatment Interventions DME instruction;Gait training;Functional mobility training;Therapeutic activities;Therapeutic exercise;Balance training;Patient/family education    PT Goals (Current goals can be found in the Care Plan section)  Acute Rehab PT Goals Patient Stated Goal: none explicitly stated, when PT suggested rehab pt states "we'll see" PT Goal Formulation: With patient Time For Goal Achievement: 02/28/21 Potential to Achieve Goals: Good    Frequency Min 2X/week   Barriers to discharge Decreased caregiver support      Co-evaluation               AM-PAC PT "6 Clicks" Mobility  Outcome Measure Help needed turning from your back to your side while in a flat bed without using bedrails?: A Lot Help needed moving from lying on your back to sitting on the side of a flat bed without using bedrails?: A Lot Help needed moving to and from a bed to a chair (including a wheelchair)?: Total Help needed standing up from a chair using your arms (e.g., wheelchair or bedside chair)?: Total Help needed to walk in hospital room?: Total Help needed climbing 3-5 steps with a railing? : Total 6 Click Score: 8    End of Session  Activity Tolerance: Patient limited by fatigue Patient left: in bed;with call bell/phone within reach;with bed alarm set Nurse Communication: Mobility status PT Visit Diagnosis: History of falling (Z91.81);Other abnormalities of gait and mobility (R26.89);Muscle weakness (generalized) (M62.81)    Time: 1222-4114 PT Time Calculation (min) (ACUTE ONLY): 30 min   Charges:   PT  Evaluation $PT Eval Low Complexity: 1 Low PT Treatments $Therapeutic Activity: 8-22 mins      Lennart Gladish S, PT Acute Rehabilitation Services Pager [phone]  Office [phone]   Roxine Caddy E Ruffin Pyo 02/14/2021, 5:10 PM

## 2021-02-14 NOTE — Progress Notes (Signed)
SLP Cancellation Note  Patient Details Name: Clinton [name] MRN: [id number] DOB: 17-Sep-1941   Cancelled treatment:        Pt declined to work with therapist stating he was sleepy, could not get any sleep. Therapist unable to return this pm   [place] Siren 02/14/2021, 4:30 PM

## 2021-02-14 NOTE — Progress Notes (Signed)
PROGRESS NOTE    Clinton [name]   OMB:[id number]  DOB: 1941/01/04  PCP: [name], NP    DOA: 02/10/2021 LOS: 4   Brief Narrative   80 y.o.malewith medical history significant ofcoronary artery disease: GERD, Gilbert's syndrome, hypertension, spinal stenosis, osteoarthritis, who was recently admitted in February with AKI. Patient is back again with complaint of vomiting blood. He had nausea vomiting initially cleared but according to patient the last oneappears to be coffee-ground. This was not observed in the ER. Stool guaiac is currently pending. Hemodynamically he appears stable and hemoglobin remains stable. Patient however was noted to be dehydrated. He also was noted to be in acute kidney injury once again. He denied any fever or chills. He has apparently been living with his son and has been drinking water. No evidence of obstructive uropathy. Patient was in the nursing home after leaving the hospital last time but is now home where he went about a week ago. Since arriving home he has gradually worsened and has not been doing well in the last couple of days. Patient is being readmitted to the hospital therefore for further evaluation and treatment.   Assessment & Plan   Principal Problem:   AKI (acute kidney injury) ([place]) Active Problems:   CAD (coronary artery disease)   HTN (hypertension)   Hyperlipidemia   GERD (gastroesophageal reflux disease)   Acute lower UTI   Hematemesis   Bladder mass   Protein-calorie malnutrition, severe   Acute kidney injury POA, improving.  Likely due to postobstructive uropathy in setting of urinary retention.   --Urology consulted --Foley placed for decompression, renal function improving --Monitor renal function  E. coli UTI and secondary bacteremia -currently on Ancef, previously Rocephin.  Follow repeat cultures (no growth today).  Changing antibiotics as below.  Dysphagia ? Aspiration pneumonia -will broaden  Ancef to Unasyn. Evaluated by SLP on 3/8, dysphagia 3 diet recommended, thin liquids, home meds with pure.  Aspiration cautions.  Essential hypertension -normotensive soft BPs while antihypertensives are held.  Continue holding for now.  Chronic urinary retention with bilateral hydronephrosis -Case was discussed with urology.  CT abdomen pelvis without any masses seen.  Dr. Alinda Money placed Foley catheter decompression.  Patient to discharge with Foley and have outpatient urology follow-up.  DVT prophylaxis: heparin injection 5,000 Units Start: 02/10/21 2200   Diet:  Diet Orders (From admission, onward)    Start     Ordered   02/11/21 0759  DIET DYS 3 Room service appropriate? Yes; Fluid consistency: Thin  Diet effective now       Comments: Heart healthy  Question Answer Comment  Room service appropriate? Yes   Fluid consistency: Thin      02/11/21 0758            Code Status: Full Code    Subjective 02/14/21    Patient sleeping when seen this morning.  He will briefly denied any pain, nausea vomiting, chest pain shortness of breath or other complaints.  No acute events reported.   Disposition Plan & Communication   Status is: Inpatient  Remains inpatient appropriate because:IV treatments appropriate due to intensity of illness or inability to take PO   Dispo: The patient is from: Home              Anticipated d/c is to: SNF              Patient currently is not medically stable to d/c.   Difficult to place patient No  Family Communication: None at bedside, will attempt to call   Consults, Procedures, Significant Events   Consultants:   Urology  Procedures:   Foley catheter placed  Antimicrobials:  Anti-infectives (From admission, onward)   Start     Dose/Rate Route Frequency Ordered Stop   02/13/21 1500  ceFAZolin (ANCEF) IVPB 2g/100 mL premix        2 g 200 mL/hr over 30 Minutes Intravenous Every 8 hours 02/13/21 1414 02/17/21 0559   02/11/21 2300   cefTRIAXone (ROCEPHIN) 2 g in sodium chloride 0.9 % 100 mL IVPB  Status:  Discontinued        2 g 200 mL/hr over 30 Minutes Intravenous Every 24 hours 02/11/21 1420 02/13/21 1414   02/10/21 2300  cefTRIAXone (ROCEPHIN) 1 g in sodium chloride 0.9 % 100 mL IVPB  Status:  Discontinued        1 g 200 mL/hr over 30 Minutes Intravenous Every 24 hours 02/10/21 2214 02/11/21 1420   02/10/21 1845  piperacillin-tazobactam (ZOSYN) IVPB 3.375 g        3.375 g 100 mL/hr over 30 Minutes Intravenous  Once 02/10/21 1832 02/10/21 2023        Micro    Objective   Vitals:   02/13/21 0537 02/13/21 1402 02/13/21 2138 02/14/21 0456  BP: 108/61 99/64 93/64  116/64  Pulse: 75 68 74 72  Resp: 18  20 20   Temp: 98 F (36.7 C) 97.6 F (36.4 C) 98.1 F (36.7 C) 98 F (36.7 C)  TempSrc: Oral Oral Oral   SpO2: 94% 99% 96% 94%  Weight: 70.4 kg   70.2 kg  Height:        Intake/Output Summary (Last 24 hours) at 02/14/2021 1350 Last data filed at 02/14/2021 1010 Gross per 24 hour  Intake 1695.59 ml  Output 1950 ml  Net -254.41 ml   Filed Weights   02/11/21 0500 02/13/21 0537 02/14/21 0456  Weight: 67.9 kg 70.4 kg 70.2 kg    Physical Exam:  General exam: Sleeping comfortably, awoke to voice, no acute distress Respiratory system: CTAB, no wheezes, rales or rhonchi, normal respiratory effort. Cardiovascular system: normal S1/S2, RRR, no pedal edema.   Gastrointestinal system: soft, NT, ND, +bowel sounds. Genitourinary: Foley catheter in place Skin: dry, intact, normal temperature  Labs   Data Reviewed: I have personally reviewed following labs and imaging studies  CBC: Recent Labs  Lab 02/10/21 2000 02/11/21 0045 02/12/21 0155 02/13/21 0007 02/14/21 0203  WBC 8.8 7.7 7.4 8.7 6.3  HGB 15.5 13.1 11.8* 12.2* 11.1*  HCT 47.3 37.0* 35.5* 35.7* 32.0*  MCV 97.1 92.5 95.4 94.7 94.4  PLT 150 146* 109* 112* 086*   Basic Metabolic Panel: Recent Labs  Lab 02/10/21 1437 02/10/21 2000  02/11/21 0045 02/12/21 0155 02/13/21 0007 02/14/21 0203  NA 137  --  137 139 136 135  K 3.6  --  3.4* 3.1* 3.0* 3.3*  CL 102  --  104 98 95* 94*  CO2 18*  --  20* 29 30 32  GLUCOSE 98  --  92 84 124* 99  BUN 58*  --  62* 52* 40* 28*  CREATININE 2.71* 2.75* 2.58* 2.17* 1.61* 1.33*  CALCIUM 9.8  --  8.8* 8.3* 7.8* 7.4*  MG  --   --   --  1.7 2.6* 2.2  PHOS  --   --   --  1.9* 1.4* 1.8*   GFR: Estimated Creatinine Clearance: 44.7 mL/min (A) (by C-G formula based on  SCr of 1.33 mg/dL (H)). Liver Function Tests: Recent Labs  Lab 02/10/21 1437 02/11/21 0045 02/12/21 0155 02/13/21 0007 02/14/21 0203  AST 27 16 15 21 21   ALT 37 25 17 17 15   ALKPHOS 108 72 55 53 46  BILITOT 1.3* 1.0 0.8 0.7 0.6  PROT 6.4* 4.8* 4.7* 4.8* 4.4*  ALBUMIN 2.9* 2.1* 1.9* 1.9* 1.7*   No results for input(s): LIPASE, AMYLASE in the last 168 hours. No results for input(s): AMMONIA in the last 168 hours. Coagulation Profile: Recent Labs  Lab 02/10/21 1635  INR 1.1   Cardiac Enzymes: No results for input(s): CKTOTAL, CKMB, CKMBINDEX, TROPONINI in the last 168 hours. BNP (last 3 results) Recent Labs    02/17/20 1505  PROBNP 329.0*   HbA1C: No results for input(s): HGBA1C in the last 72 hours. CBG: No results for input(s): GLUCAP in the last 168 hours. Lipid Profile: No results for input(s): CHOL, HDL, LDLCALC, TRIG, CHOLHDL, LDLDIRECT in the last 72 hours. Thyroid Function Tests: No results for input(s): TSH, T4TOTAL, FREET4, T3FREE, THYROIDAB in the last 72 hours. Anemia Panel: No results for input(s): VITAMINB12, FOLATE, FERRITIN, TIBC, IRON, RETICCTPCT in the last 72 hours. Sepsis Labs: Recent Labs  Lab 02/10/21 1635 02/10/21 2000 02/11/21 0822  LATICACIDVEN 2.9* 3.0* 1.6    Recent Results (from the past 240 hour(s))  Resp Panel by RT-PCR (Flu A&B, Covid) Nasopharyngeal Swab     Status: None   Collection Time: 02/10/21  4:26 PM   Specimen: Nasopharyngeal Swab; Nasopharyngeal(NP)  swabs in vial transport medium  Result Value Ref Range Status   SARS Coronavirus 2 by RT PCR NEGATIVE NEGATIVE Final    Comment: (NOTE) SARS-CoV-2 target nucleic acids are NOT DETECTED.  The SARS-CoV-2 RNA is generally detectable in upper respiratory specimens during the acute phase of infection. The lowest concentration of SARS-CoV-2 viral copies this assay can detect is 138 copies/mL. A negative result does not preclude SARS-Cov-2 infection and should not be used as the sole basis for treatment or other patient management decisions. A negative result may occur with  improper specimen collection/handling, submission of specimen other than nasopharyngeal swab, presence of viral mutation(s) within the areas targeted by this assay, and inadequate number of viral copies(<138 copies/mL). A negative result must be combined with clinical observations, patient history, and epidemiological information. The expected result is Negative.  Fact Sheet for Patients:  EntrepreneurPulse.com.au  Fact Sheet for Healthcare Providers:  IncredibleEmployment.be  This test is no t yet approved or cleared by the [place] FDA and  has been authorized for detection and/or diagnosis of SARS-CoV-2 by FDA under an Emergency Use Authorization (EUA). This EUA will remain  in effect (meaning this test can be used) for the duration of the COVID-19 declaration under Section 564(b)(1) of the Act, 21 U.S.C.section 360bbb-3(b)(1), unless the authorization is terminated  or revoked sooner.       Influenza A by PCR NEGATIVE NEGATIVE Final   Influenza B by PCR NEGATIVE NEGATIVE Final    Comment: (NOTE) The Xpert Xpress SARS-CoV-2/FLU/RSV plus assay is intended as an aid in the diagnosis of influenza from Nasopharyngeal swab specimens and should not be used as a sole basis for treatment. Nasal washings and aspirates are unacceptable for Xpert Xpress  SARS-CoV-2/FLU/RSV testing.  Fact Sheet for Patients: EntrepreneurPulse.com.au  Fact Sheet for Healthcare Providers: IncredibleEmployment.be  This test is not yet approved or cleared by the [place] FDA and has been authorized for detection and/or diagnosis of SARS-CoV-2 by  FDA under an Emergency Use Authorization (EUA). This EUA will remain in effect (meaning this test can be used) for the duration of the COVID-19 declaration under Section 564(b)(1) of the Act, 21 U.S.C. section 360bbb-3(b)(1), unless the authorization is terminated or revoked.  Performed at [facility], [place] [address], [place], [place] [address]   Culture, blood (routine x 2)     Status: None (Preliminary result)   Collection Time: 02/10/21  4:35 PM   Specimen: BLOOD  Result Value Ref Range Status   Specimen Description BLOOD SITE NOT SPECIFIED  Final   Special Requests   Final    BOTTLES DRAWN AEROBIC AND ANAEROBIC Blood Culture adequate volume   Culture   Final    NO GROWTH 4 DAYS Performed at [facility], [address], [place], [place] [address]    Report Status PENDING  Incomplete  Culture, blood (routine x 2)     Status: Abnormal (Preliminary result)   Collection Time: 02/10/21  4:47 PM   Specimen: BLOOD  Result Value Ref Range Status   Specimen Description BLOOD SITE NOT SPECIFIED  Final   Special Requests   Final    BOTTLES DRAWN AEROBIC AND ANAEROBIC Blood Culture adequate volume   Culture  Setup Time   Final    GRAM NEGATIVE RODS ANAEROBIC BOTTLE ONLY CRITICAL RESULT CALLED TO, READ BACK BY AND VERIFIED WITH: C,PIERCE PHARMD @1403  02/11/21 EB Performed at [facility], [place] [address] [place], [place] [address]    Culture ESCHERICHIA COLI (A)  Final   Report Status PENDING  Incomplete   Organism ID, Bacteria ESCHERICHIA COLI  Final      Susceptibility   Escherichia coli - MIC*    AMPICILLIN 4 SENSITIVE Sensitive     CEFAZOLIN  <=4 SENSITIVE Sensitive     CEFEPIME <=0.12 SENSITIVE Sensitive     CEFTAZIDIME <=1 SENSITIVE Sensitive     CEFTRIAXONE <=0.25 SENSITIVE Sensitive     CIPROFLOXACIN <=0.25 SENSITIVE Sensitive     GENTAMICIN <=1 SENSITIVE Sensitive     IMIPENEM <=0.25 SENSITIVE Sensitive     TRIMETH/SULFA <=20 SENSITIVE Sensitive     AMPICILLIN/SULBACTAM 4 SENSITIVE Sensitive     PIP/TAZO <=4 SENSITIVE Sensitive     * ESCHERICHIA COLI  Blood Culture ID Panel (Reflexed)     Status: Abnormal   Collection Time: 02/10/21  4:47 PM  Result Value Ref Range Status   Enterococcus faecalis NOT DETECTED NOT DETECTED Final   Enterococcus Faecium NOT DETECTED NOT DETECTED Final   Listeria monocytogenes NOT DETECTED NOT DETECTED Final   Staphylococcus species NOT DETECTED NOT DETECTED Final   Staphylococcus aureus (BCID) NOT DETECTED NOT DETECTED Final   Staphylococcus epidermidis NOT DETECTED NOT DETECTED Final   Staphylococcus lugdunensis NOT DETECTED NOT DETECTED Final   Streptococcus species NOT DETECTED NOT DETECTED Final   Streptococcus agalactiae NOT DETECTED NOT DETECTED Final   Streptococcus pneumoniae NOT DETECTED NOT DETECTED Final   Streptococcus pyogenes NOT DETECTED NOT DETECTED Final   A.calcoaceticus-baumannii NOT DETECTED NOT DETECTED Final   Bacteroides fragilis NOT DETECTED NOT DETECTED Final   Enterobacterales DETECTED (A) NOT DETECTED Final    Comment: Enterobacterales represent a large order of gram negative bacteria, not a single organism. CRITICAL RESULT CALLED TO, READ BACK BY AND VERIFIED WITH: C,PIERCE PHARMD @1403  02/11/21 EB    Enterobacter cloacae complex NOT DETECTED NOT DETECTED Final   Escherichia coli DETECTED (A) NOT DETECTED Final    Comment: CRITICAL  RESULT CALLED TO, READ BACK BY AND VERIFIED WITH: C,PIERCE PHARMD @1403  02/11/21 EB    Klebsiella aerogenes NOT DETECTED NOT DETECTED Final   Klebsiella oxytoca NOT DETECTED NOT DETECTED Final   Klebsiella pneumoniae NOT DETECTED  NOT DETECTED Final   Proteus species NOT DETECTED NOT DETECTED Final   Salmonella species NOT DETECTED NOT DETECTED Final   Serratia marcescens NOT DETECTED NOT DETECTED Final   Haemophilus influenzae NOT DETECTED NOT DETECTED Final   Neisseria meningitidis NOT DETECTED NOT DETECTED Final   Pseudomonas aeruginosa NOT DETECTED NOT DETECTED Final   Stenotrophomonas maltophilia NOT DETECTED NOT DETECTED Final   Candida albicans NOT DETECTED NOT DETECTED Final   Candida auris NOT DETECTED NOT DETECTED Final   Candida glabrata NOT DETECTED NOT DETECTED Final   Candida krusei NOT DETECTED NOT DETECTED Final   Candida parapsilosis NOT DETECTED NOT DETECTED Final   Candida tropicalis NOT DETECTED NOT DETECTED Final   Cryptococcus neoformans/gattii NOT DETECTED NOT DETECTED Final   CTX-M ESBL NOT DETECTED NOT DETECTED Final   Carbapenem resistance IMP NOT DETECTED NOT DETECTED Final   Carbapenem resistance KPC NOT DETECTED NOT DETECTED Final   Carbapenem resistance NDM NOT DETECTED NOT DETECTED Final   Carbapenem resist OXA 48 LIKE NOT DETECTED NOT DETECTED Final   Carbapenem resistance VIM NOT DETECTED NOT DETECTED Final    Comment: Performed at [facility], [address] [place], [place] [address]  Urine culture     Status: Abnormal   Collection Time: 02/10/21  9:29 PM   Specimen: Urine, Random  Result Value Ref Range Status   Specimen Description URINE, RANDOM  Final   Special Requests   Final    NONE Performed at [facility], [place] [address] [place], [place] [address]    Culture >=100,000 COLONIES/mL ESCHERICHIA COLI (A)  Final   Report Status 02/13/2021 FINAL  Final   Organism ID, Bacteria ESCHERICHIA COLI (A)  Final      Susceptibility   Escherichia coli - MIC*    AMPICILLIN 8 SENSITIVE Sensitive     CEFAZOLIN <=4 SENSITIVE Sensitive     CEFEPIME <=0.12 SENSITIVE Sensitive     CEFTRIAXONE <=0.25 SENSITIVE Sensitive     CIPROFLOXACIN <=0.25 SENSITIVE Sensitive      GENTAMICIN <=1 SENSITIVE Sensitive     IMIPENEM <=0.25 SENSITIVE Sensitive     NITROFURANTOIN <=16 SENSITIVE Sensitive     TRIMETH/SULFA <=20 SENSITIVE Sensitive     AMPICILLIN/SULBACTAM 4 SENSITIVE Sensitive     PIP/TAZO <=4 SENSITIVE Sensitive     * >=100,000 COLONIES/mL ESCHERICHIA COLI  Culture, blood (Routine X 2) w Reflex to ID Panel     Status: None (Preliminary result)   Collection Time: 02/12/21 12:21 PM   Specimen: BLOOD LEFT HAND  Result Value Ref Range Status   Specimen Description BLOOD LEFT HAND  Final   Special Requests   Final    BOTTLES DRAWN AEROBIC AND ANAEROBIC Blood Culture adequate volume   Culture   Final    NO GROWTH 2 DAYS Performed at [facility], [address] [place], [place] [address]    Report Status PENDING  Incomplete  Culture, blood (Routine X 2) w Reflex to ID Panel     Status: None (Preliminary result)   Collection Time: 02/12/21 12:22 PM   Specimen: BLOOD RIGHT ARM  Result Value Ref Range Status   Specimen Description BLOOD RIGHT ARM  Final   Special Requests   Final  BOTTLES DRAWN AEROBIC AND ANAEROBIC Blood Culture adequate volume   Culture   Final    NO GROWTH 2 DAYS Performed at [facility], [place] [address] [place], [place] [address]    Report Status PENDING  Incomplete      Imaging Studies   No results found.   Medications   Scheduled Meds: . Chlorhexidine Gluconate Cloth  6 each Topical Daily  . feeding supplement  237 mL Oral TID BM  . heparin  5,000 Units Subcutaneous Q8H  . multivitamin with minerals  1 tablet Oral Daily  . pantoprazole  40 mg Oral Q0600   Continuous Infusions: .  ceFAZolin (ANCEF) IV 2 g (02/14/21 0525)  . potassium PHOSPHATE IVPB (in mmol) 20 mmol (02/14/21 1120)       LOS: 4 days    Time spent: 25 minutes with greater than 50% spent at bedside and in coordination of care.    Ezekiel Slocumb, DO [facility]  02/14/2021, 1:50 PM      If 7PM-7AM, please contact  night-coverage. How to contact the [facility] Attending or Consulting provider [place] or covering provider during after hours [place], for this patient?    1. Check the care team in [facility] and look for a) attending/consulting TR[facility] provider listed and b) the [facility] team listed 2. Log into [email or web address] and use [place]'s universal password to access. If you do not have the password, please contact the hospital operator. 3. Locate the [facility] provider you are looking for under [facility] and page to a number that you can be directly reached. 4. If you still have difficulty reaching the provider, please page the [facility] (Director on Call) for the Hospitalists listed on amion for assistance.

## 2021-02-14 NOTE — Progress Notes (Signed)
Pharmacy Antibiotic Note  Clinton [name] is a 80 y.o. male admitted on 02/10/2021.  Pharmacy has been consulted for Unasyn dosing for aspiration PNA. Patient also with e. Coli bacteremia - pansensitive. CT chest on 3/6 with bibasilar airspace opacities consist with acute infiltrate - possible aspiration. WBC wnl. Afebrile. CrCl 45 ml/min.   Plan: Start Unasyn 3g IV q8h Monitor renal function, length of therapy, and clinical progression  Height: 5\' 10"  (177.8 cm) Weight: 70.2 kg (154 lb 12.2 oz) IBW/kg (Calculated) : 73  Temp (24hrs), Avg:98.1 F (36.7 C), Min:98 F (36.7 C), Max:98.1 F (36.7 C)  Recent Labs  Lab 02/10/21 1635 02/10/21 2000 02/11/21 0045 02/11/21 0822 02/12/21 0155 02/13/21 0007 02/14/21 0203  WBC  --  8.8 7.7  --  7.4 8.7 6.3  CREATININE  --  2.75* 2.58*  --  2.17* 1.61* 1.33*  LATICACIDVEN 2.9* 3.0*  --  1.6  --   --   --     Estimated Creatinine Clearance: 44.7 mL/min (A) (by C-G formula based on SCr of 1.33 mg/dL (H)).    No Known Allergies  Antimicrobials this admission: CTX x1 3/5 >>3/7 Zosyn x1 3/5 Cefazolin 3/8 >>3/9  Unasyn 3/9 >>  Dose adjustments this admission: N/a  Microbiology results: 3/5 ucx: e. Coli 3/5 bcx: 1/4 e. Coli (pansensitive) 3/7 bcx: ngtd   Thank you for allowing pharmacy to be a part of this patient's care.  Cristela Felt, PharmD Clinical Pharmacist  02/14/2021 2:06 PM

## 2021-02-15 DIAGNOSIS — I10 Essential (primary) hypertension: Secondary | ICD-10-CM | POA: Diagnosis not present

## 2021-02-15 DIAGNOSIS — E43 Unspecified severe protein-calorie malnutrition: Secondary | ICD-10-CM | POA: Diagnosis not present

## 2021-02-15 DIAGNOSIS — N390 Urinary tract infection, site not specified: Secondary | ICD-10-CM | POA: Diagnosis not present

## 2021-02-15 DIAGNOSIS — N179 Acute kidney failure, unspecified: Secondary | ICD-10-CM | POA: Diagnosis not present

## 2021-02-15 LAB — COMPREHENSIVE METABOLIC PANEL
ALT: 11 U/L (ref 0–44)
AST: 22 U/L (ref 15–41)
Albumin: 1.9 g/dL — ABNORMAL LOW (ref 3.5–5.0)
Alkaline Phosphatase: 49 U/L (ref 38–126)
Anion gap: 8 (ref 5–15)
BUN: 21 mg/dL (ref 8–23)
CO2: 31 mmol/L (ref 22–32)
Calcium: 7.5 mg/dL — ABNORMAL LOW (ref 8.9–10.3)
Chloride: 96 mmol/L — ABNORMAL LOW (ref 98–111)
Creatinine, Ser: 1.26 mg/dL — ABNORMAL HIGH (ref 0.61–1.24)
GFR, Estimated: 58 mL/min — ABNORMAL LOW (ref >60)
Glucose, Bld: 84 mg/dL (ref 70–99)
Potassium: 3.6 mmol/L (ref 3.5–5.1)
Sodium: 135 mmol/L (ref 135–145)
Total Bilirubin: 0.9 mg/dL (ref 0.3–1.2)
Total Protein: 4.7 g/dL — ABNORMAL LOW (ref 6.5–8.1)

## 2021-02-15 LAB — CULTURE, BLOOD (ROUTINE X 2)
Culture: NO GROWTH
Special Requests: ADEQUATE

## 2021-02-15 LAB — PHOSPHORUS: Phosphorus: 2.3 mg/dL — ABNORMAL LOW (ref 2.5–4.6)

## 2021-02-15 LAB — MAGNESIUM: Magnesium: 2 mg/dL (ref 1.7–2.4)

## 2021-02-15 NOTE — Progress Notes (Signed)
Nutrition Follow-up  DOCUMENTATION CODES:   Severe malnutrition in context of acute illness/injury  INTERVENTION:   -Continue Ensure Enlive po TID, each supplement provides 350 kcal and 20 grams of protein -Continue MVI with minerals daily -Continue Magic cup TID with meals, each supplement provides 290 kcal and 9 grams of protein  NUTRITION DIAGNOSIS:   Severe Malnutrition related to acute illness (AKI) as evidenced by severe muscle depletion,severe fat depletion.  Ongoing  GOAL:   Patient will meet greater than or equal to 90% of their needs  Progressing   MONITOR:   PO intake,Supplement acceptance,Labs  REASON FOR ASSESSMENT:   Consult Assessment of nutrition requirement/status  ASSESSMENT:   81 yo male with a PMH of CAD, GERD, HTN, osteoarthritis, and Gilbert's syndrome presents with hematemesis with dx of AKI from UTI.  3/8- s/p BSE- downgraded to dysphagia 3 diet with thin liquids  Reviewed I/O's: -1.9 L x 24 hours and -18 L since admission  UOP: 2.3 L x 24 hours  Pt unavailable at time of visit.   Per SLP notes, pt is tolerating current diet texture well. Intake has improved; noted meal completion 25-75%. Pt is taking Ensure Enlive supplements per MAR.  Per MD notes, plan to d/c to SNF once medically stable.   Labs reviewed: Phos: 2.3.   Diet Order:   Diet Order            DIET DYS 3 Room service appropriate? Yes; Fluid consistency: Thin  Diet effective now                 EDUCATION NEEDS:   Education needs have been addressed  Skin:  Skin Assessment: Reviewed RN Assessment  Last BM:  02/12/21  Height:   Ht Readings from Last 1 Encounters:  02/10/21 5\' 10"  (1.778 m)    Weight:   Wt Readings from Last 1 Encounters:  02/14/21 70.2 kg    Ideal Body Weight:  75.5 kg  BMI:  Body mass index is 22.21 kg/m.  Estimated Nutritional Needs:   Kcal:  2100-2300  Protein:  105-120 grams  Fluid:  > 2 L    Loistine Chance, RD, LDN,  [place] Registered Dietitian II Certified Diabetes Care and Education Specialist Please refer to [facility] for RD and/or RD on-call/weekend/after hours pager

## 2021-02-15 NOTE — Progress Notes (Addendum)
PROGRESS NOTE    Clinton [name]   IRS:[id number]  DOB: [birth year]  PCP: [name], NP    DOA: 02/10/2021 LOS: 5   Brief Narrative   80 y.o.malewith medical history significant ofcoronary artery disease: GERD, Gilbert's syndrome, hypertension, spinal stenosis, osteoarthritis, who was recently admitted in February with AKI. Patient is back again with complaint of vomiting blood. He had nausea vomiting initially cleared but according to patient the last oneappears to be coffee-ground. This was not observed in the ER. Stool guaiac is currently pending. Hemodynamically he appears stable and hemoglobin remains stable. Patient however was noted to be dehydrated. He also was noted to be in acute kidney injury once again. He denied any fever or chills. He has apparently been living with his son and has been drinking water. No evidence of obstructive uropathy. Patient was in the nursing home after leaving the hospital last time but is now home where he went about a week ago. Since arriving home he has gradually worsened and has not been doing well in the last couple of days. Patient is being readmitted to the hospital therefore for further evaluation and treatment.   Assessment & Plan   Principal Problem:   AKI (acute kidney injury) ([place]) Active Problems:   CAD (coronary artery disease)   HTN (hypertension)   Hyperlipidemia   GERD (gastroesophageal reflux disease)   Acute lower UTI   Hematemesis   Bladder mass   Protein-calorie malnutrition, severe   Acute kidney injury POA, improving.  Likely due to postobstructive uropathy in setting of urinary retention.   --Urology consulted --Foley placed for decompression, renal function improving --Monitor renal function  E. coli UTI and secondary bacteremia -currently on Ancef, previously Rocephin.  Follow repeat cultures (no growth today).  Ancef was changed to Unasyn on 3/9.  Will discharge on Augmentin to complete  course.  Dysphagia ? Aspiration pneumonia -will broaden Ancef to Unasyn.  Suspect aspiration during emesis earlier this admission. Evaluated by SLP on 3/8, dysphagia 3 diet recommended, thin liquids, home meds with pure.  Aspiration cautions.\  Thrombocytopenia -suspected either sepsis or possibly antibiotic.  Monitor CBC.  Is on heparin for VTE prophylaxis, okay to continue for now.   Essential hypertension -normotensive soft BPs while antihypertensives are held.  Continue holding for now to avoid hypotension.  Chronic urinary retention with bilateral hydronephrosis -Case was discussed with urology.  CT abdomen pelvis without any masses seen.  Dr. Alinda Money placed Foley catheter for decompression.  Patient to discharge with Foley and have outpatient urology follow-up.  DVT prophylaxis: heparin injection 5,000 Units Start: 02/10/21 2200   Diet:  Diet Orders (From admission, onward)    Start     Ordered   02/11/21 0759  DIET DYS 3 Room service appropriate? Yes; Fluid consistency: Thin  Diet effective now       Comments: Heart healthy  Question Answer Comment  Room service appropriate? Yes   Fluid consistency: Thin      02/11/21 0758            Code Status: Full Code    Subjective 02/15/21    Patient awake, getting morning meds when seen.  SLP just evaluated patient said he did well other than a mild cough with a dry bite of muffin.  Patient denies acute complaints including fevers or chills, abdominal pain, nausea vomiting, chest pain shortness of breath.  No acute is reported.   Disposition Plan & Communication   Status is: Inpatient  Remains  inpatient appropriate because: Still placement pending, continue monitoring and replacing electrolytes.   Dispo: The patient is from: Home              Anticipated d/c is to: SNF              Patient currently is not medically stable to d/c.   Difficult to place patient yes  Family Communication: None at bedside, will attempt to  call   Consults, Procedures, Significant Events   Consultants:   Urology  Procedures:   Foley catheter placed  Antimicrobials:  Anti-infectives (From admission, onward)   Start     Dose/Rate Route Frequency Ordered Stop   02/14/21 1500  Ampicillin-Sulbactam (UNASYN) 3 g in sodium chloride 0.9 % 100 mL IVPB        3 g 200 mL/hr over 30 Minutes Intravenous Every 8 hours 02/14/21 1413     02/13/21 1500  ceFAZolin (ANCEF) IVPB 2g/100 mL premix  Status:  Discontinued        2 g 200 mL/hr over 30 Minutes Intravenous Every 8 hours 02/13/21 1414 02/14/21 1356   02/11/21 2300  cefTRIAXone (ROCEPHIN) 2 g in sodium chloride 0.9 % 100 mL IVPB  Status:  Discontinued        2 g 200 mL/hr over 30 Minutes Intravenous Every 24 hours 02/11/21 1420 02/13/21 1414   02/10/21 2300  cefTRIAXone (ROCEPHIN) 1 g in sodium chloride 0.9 % 100 mL IVPB  Status:  Discontinued        1 g 200 mL/hr over 30 Minutes Intravenous Every 24 hours 02/10/21 2214 02/11/21 1420   02/10/21 1845  piperacillin-tazobactam (ZOSYN) IVPB 3.375 g        3.375 g 100 mL/hr over 30 Minutes Intravenous  Once 02/10/21 1832 02/10/21 2023        Micro    Objective   Vitals:   02/14/21 0456 02/14/21 1615 02/14/21 2102 02/15/21 0500  BP: 116/64 103/65 (!) 144/75 119/65  Pulse: 72 96 75 71  Resp: 20 16 18 17   Temp: 98 F (36.7 C) (!) 97.3 F (36.3 C) 98 F (36.7 C) 97.8 F (36.6 C)  TempSrc:    Oral  SpO2: 94% 98% 95% 96%  Weight: 70.2 kg     Height:        Intake/Output Summary (Last 24 hours) at 02/15/2021 1602 Last data filed at 02/15/2021 0920 Gross per 24 hour  Intake 180 ml  Output 1750 ml  Net -1570 ml   Filed Weights   02/11/21 0500 02/13/21 0537 02/14/21 0456  Weight: 67.9 kg 70.4 kg 70.2 kg    Physical Exam:  General exam: Awake alert, no acute distress Respiratory system: CTAB, no wheezes, rales or rhonchi, normal respiratory effort. Cardiovascular system: normal S1/S2, RRR, no pedal edema.    Gastrointestinal system: soft, NT, ND, +bowel sounds. Genitourinary: Foley catheter in place   Labs   Data Reviewed: I have personally reviewed following labs and imaging studies  CBC: Recent Labs  Lab 02/10/21 2000 02/11/21 0045 02/12/21 0155 02/13/21 0007 02/14/21 0203  WBC 8.8 7.7 7.4 8.7 6.3  HGB 15.5 13.1 11.8* 12.2* 11.1*  HCT 47.3 37.0* 35.5* 35.7* 32.0*  MCV 97.1 92.5 95.4 94.7 94.4  PLT 150 146* 109* 112* 630*   Basic Metabolic Panel: Recent Labs  Lab 02/11/21 0045 02/12/21 0155 02/13/21 0007 02/14/21 0203 02/15/21 0059  NA 137 139 136 135 135  K 3.4* 3.1* 3.0* 3.3* 3.6  CL 104 98 95*  94* 96*  CO2 20* 29 30 32 31  GLUCOSE 92 84 124* 99 84  BUN 62* 52* 40* 28* 21  CREATININE 2.58* 2.17* 1.61* 1.33* 1.26*  CALCIUM 8.8* 8.3* 7.8* 7.4* 7.5*  MG  --  1.7 2.6* 2.2 2.0  PHOS  --  1.9* 1.4* 1.8* 2.3*   GFR: Estimated Creatinine Clearance: 47.2 mL/min (A) (by C-G formula based on SCr of 1.26 mg/dL (H)). Liver Function Tests: Recent Labs  Lab 02/11/21 0045 02/12/21 0155 02/13/21 0007 02/14/21 0203 02/15/21 0059  AST 16 15 21 21 22   ALT 25 17 17 15 11   ALKPHOS 72 55 53 46 49  BILITOT 1.0 0.8 0.7 0.6 0.9  PROT 4.8* 4.7* 4.8* 4.4* 4.7*  ALBUMIN 2.1* 1.9* 1.9* 1.7* 1.9*   No results for input(s): LIPASE, AMYLASE in the last 168 hours. No results for input(s): AMMONIA in the last 168 hours. Coagulation Profile: Recent Labs  Lab 02/10/21 1635  INR 1.1   Cardiac Enzymes: No results for input(s): CKTOTAL, CKMB, CKMBINDEX, TROPONINI in the last 168 hours. BNP (last 3 results) Recent Labs    02/17/20 1505  PROBNP 329.0*   HbA1C: No results for input(s): HGBA1C in the last 72 hours. CBG: No results for input(s): GLUCAP in the last 168 hours. Lipid Profile: No results for input(s): CHOL, HDL, LDLCALC, TRIG, CHOLHDL, LDLDIRECT in the last 72 hours. Thyroid Function Tests: No results for input(s): TSH, T4TOTAL, FREET4, T3FREE, THYROIDAB in the last  72 hours. Anemia Panel: No results for input(s): VITAMINB12, FOLATE, FERRITIN, TIBC, IRON, RETICCTPCT in the last 72 hours. Sepsis Labs: Recent Labs  Lab 02/10/21 1635 02/10/21 2000 02/11/21 0822  LATICACIDVEN 2.9* 3.0* 1.6    Recent Results (from the past 240 hour(s))  Resp Panel by RT-PCR (Flu A&B, Covid) Nasopharyngeal Swab     Status: None   Collection Time: 02/10/21  4:26 PM   Specimen: Nasopharyngeal Swab; Nasopharyngeal(NP) swabs in vial transport medium  Result Value Ref Range Status   SARS Coronavirus 2 by RT PCR NEGATIVE NEGATIVE Final    Comment: (NOTE) SARS-CoV-2 target nucleic acids are NOT DETECTED.  The SARS-CoV-2 RNA is generally detectable in upper respiratory specimens during the acute phase of infection. The lowest concentration of SARS-CoV-2 viral copies this assay can detect is 138 copies/mL. A negative result does not preclude SARS-Cov-2 infection and should not be used as the sole basis for treatment or other patient management decisions. A negative result may occur with  improper specimen collection/handling, submission of specimen other than nasopharyngeal swab, presence of viral mutation(s) within the areas targeted by this assay, and inadequate number of viral copies(<138 copies/mL). A negative result must be combined with clinical observations, patient history, and epidemiological information. The expected result is Negative.  Fact Sheet for Patients:  EntrepreneurPulse.com.au  Fact Sheet for Healthcare Providers:  IncredibleEmployment.be  This test is no t yet approved or cleared by the [place] FDA and  has been authorized for detection and/or diagnosis of SARS-CoV-2 by FDA under an Emergency Use Authorization (EUA). This EUA will remain  in effect (meaning this test can be used) for the duration of the COVID-19 declaration under Section 564(b)(1) of the Act, 21 U.S.C.section 360bbb-3(b)(1), unless the  authorization is terminated  or revoked sooner.       Influenza A by PCR NEGATIVE NEGATIVE Final   Influenza B by PCR NEGATIVE NEGATIVE Final    Comment: (NOTE) The Xpert Xpress SARS-CoV-2/FLU/RSV plus assay is intended as an aid  in the diagnosis of influenza from Nasopharyngeal swab specimens and should not be used as a sole basis for treatment. Nasal washings and aspirates are unacceptable for Xpert Xpress SARS-CoV-2/FLU/RSV testing.  Fact Sheet for Patients: EntrepreneurPulse.com.au  Fact Sheet for Healthcare Providers: IncredibleEmployment.be  This test is not yet approved or cleared by the [place] FDA and has been authorized for detection and/or diagnosis of SARS-CoV-2 by FDA under an Emergency Use Authorization (EUA). This EUA will remain in effect (meaning this test can be used) for the duration of the COVID-19 declaration under Section 564(b)(1) of the Act, 21 U.S.C. section 360bbb-3(b)(1), unless the authorization is terminated or revoked.  Performed at [facility], [place] [address] [place], [place] [address]   Culture, blood (routine x 2)     Status: None   Collection Time: 02/10/21  4:35 PM   Specimen: BLOOD  Result Value Ref Range Status   Specimen Description BLOOD SITE NOT SPECIFIED  Final   Special Requests   Final    BOTTLES DRAWN AEROBIC AND ANAEROBIC Blood Culture adequate volume   Culture   Final    NO GROWTH 5 DAYS Performed at [facility], [address], [place], [place] [address]    Report Status 02/15/2021 FINAL  Final  Culture, blood (routine x 2)     Status: Abnormal   Collection Time: 02/10/21  4:47 PM   Specimen: BLOOD  Result Value Ref Range Status   Specimen Description BLOOD SITE NOT SPECIFIED  Final   Special Requests   Final    BOTTLES DRAWN AEROBIC AND ANAEROBIC Blood Culture adequate volume   Culture  Setup Time   Final    GRAM NEGATIVE RODS ANAEROBIC BOTTLE ONLY CRITICAL  RESULT CALLED TO, READ BACK BY AND VERIFIED WITH: C,PIERCE PHARMD @1403  02/11/21 EB Performed at [facility], [place] [address] [place], [place] [address]    Culture ESCHERICHIA COLI (A)  Final   Report Status 02/14/2021 FINAL  Final   Organism ID, Bacteria ESCHERICHIA COLI  Final      Susceptibility   Escherichia coli - MIC*    AMPICILLIN 4 SENSITIVE Sensitive     CEFAZOLIN <=4 SENSITIVE Sensitive     CEFEPIME <=0.12 SENSITIVE Sensitive     CEFTAZIDIME <=1 SENSITIVE Sensitive     CEFTRIAXONE <=0.25 SENSITIVE Sensitive     CIPROFLOXACIN <=0.25 SENSITIVE Sensitive     GENTAMICIN <=1 SENSITIVE Sensitive     IMIPENEM <=0.25 SENSITIVE Sensitive     TRIMETH/SULFA <=20 SENSITIVE Sensitive     AMPICILLIN/SULBACTAM 4 SENSITIVE Sensitive     PIP/TAZO <=4 SENSITIVE Sensitive     * ESCHERICHIA COLI  Blood Culture ID Panel (Reflexed)     Status: Abnormal   Collection Time: 02/10/21  4:47 PM  Result Value Ref Range Status   Enterococcus faecalis NOT DETECTED NOT DETECTED Final   Enterococcus Faecium NOT DETECTED NOT DETECTED Final   Listeria monocytogenes NOT DETECTED NOT DETECTED Final   Staphylococcus species NOT DETECTED NOT DETECTED Final   Staphylococcus aureus (BCID) NOT DETECTED NOT DETECTED Final   Staphylococcus epidermidis NOT DETECTED NOT DETECTED Final   Staphylococcus lugdunensis NOT DETECTED NOT DETECTED Final   Streptococcus species NOT DETECTED NOT DETECTED Final   Streptococcus agalactiae NOT DETECTED NOT DETECTED Final   Streptococcus pneumoniae NOT DETECTED NOT DETECTED Final   Streptococcus pyogenes NOT DETECTED NOT DETECTED Final   A.calcoaceticus-baumannii NOT DETECTED NOT DETECTED Final   Bacteroides fragilis NOT DETECTED NOT  DETECTED Final   Enterobacterales DETECTED (A) NOT DETECTED Final    Comment: Enterobacterales represent a large order of gram negative bacteria, not a single organism. CRITICAL RESULT CALLED TO, READ BACK BY AND VERIFIED WITH: C,PIERCE  PHARMD @1403  02/11/21 EB    Enterobacter cloacae complex NOT DETECTED NOT DETECTED Final   Escherichia coli DETECTED (A) NOT DETECTED Final    Comment: CRITICAL RESULT CALLED TO, READ BACK BY AND VERIFIED WITH: C,PIERCE PHARMD @1403  02/11/21 EB    Klebsiella aerogenes NOT DETECTED NOT DETECTED Final   Klebsiella oxytoca NOT DETECTED NOT DETECTED Final   Klebsiella pneumoniae NOT DETECTED NOT DETECTED Final   Proteus species NOT DETECTED NOT DETECTED Final   Salmonella species NOT DETECTED NOT DETECTED Final   Serratia marcescens NOT DETECTED NOT DETECTED Final   Haemophilus influenzae NOT DETECTED NOT DETECTED Final   Neisseria meningitidis NOT DETECTED NOT DETECTED Final   Pseudomonas aeruginosa NOT DETECTED NOT DETECTED Final   Stenotrophomonas maltophilia NOT DETECTED NOT DETECTED Final   Candida albicans NOT DETECTED NOT DETECTED Final   Candida auris NOT DETECTED NOT DETECTED Final   Candida glabrata NOT DETECTED NOT DETECTED Final   Candida krusei NOT DETECTED NOT DETECTED Final   Candida parapsilosis NOT DETECTED NOT DETECTED Final   Candida tropicalis NOT DETECTED NOT DETECTED Final   Cryptococcus neoformans/gattii NOT DETECTED NOT DETECTED Final   CTX-M ESBL NOT DETECTED NOT DETECTED Final   Carbapenem resistance IMP NOT DETECTED NOT DETECTED Final   Carbapenem resistance KPC NOT DETECTED NOT DETECTED Final   Carbapenem resistance NDM NOT DETECTED NOT DETECTED Final   Carbapenem resist OXA 48 LIKE NOT DETECTED NOT DETECTED Final   Carbapenem resistance VIM NOT DETECTED NOT DETECTED Final    Comment: Performed at [facility], [address], [place], [place] [address]  Urine culture     Status: Abnormal   Collection Time: 02/10/21  9:29 PM   Specimen: Urine, Random  Result Value Ref Range Status   Specimen Description URINE, RANDOM  Final   Special Requests   Final    NONE Performed at [facility], [place] [address] [place], [place] [address]    Culture  >=100,000 COLONIES/mL ESCHERICHIA COLI (A)  Final   Report Status 02/13/2021 FINAL  Final   Organism ID, Bacteria ESCHERICHIA COLI (A)  Final      Susceptibility   Escherichia coli - MIC*    AMPICILLIN 8 SENSITIVE Sensitive     CEFAZOLIN <=4 SENSITIVE Sensitive     CEFEPIME <=0.12 SENSITIVE Sensitive     CEFTRIAXONE <=0.25 SENSITIVE Sensitive     CIPROFLOXACIN <=0.25 SENSITIVE Sensitive     GENTAMICIN <=1 SENSITIVE Sensitive     IMIPENEM <=0.25 SENSITIVE Sensitive     NITROFURANTOIN <=16 SENSITIVE Sensitive     TRIMETH/SULFA <=20 SENSITIVE Sensitive     AMPICILLIN/SULBACTAM 4 SENSITIVE Sensitive     PIP/TAZO <=4 SENSITIVE Sensitive     * >=100,000 COLONIES/mL ESCHERICHIA COLI  Culture, blood (Routine X 2) w Reflex to ID Panel     Status: None (Preliminary result)   Collection Time: 02/12/21 12:21 PM   Specimen: BLOOD LEFT HAND  Result Value Ref Range Status   Specimen Description BLOOD LEFT HAND  Final   Special Requests   Final    BOTTLES DRAWN AEROBIC AND ANAEROBIC Blood Culture adequate volume   Culture   Final    NO GROWTH 3 DAYS Performed at [facility], [address], [place], [place] [address]    Report Status PENDING  Incomplete  Culture, blood (Routine X 2) w Reflex to ID Panel     Status: None (Preliminary result)   Collection Time: 02/12/21 12:22 PM   Specimen: BLOOD RIGHT ARM  Result Value Ref Range Status   Specimen Description BLOOD RIGHT ARM  Final   Special Requests   Final    BOTTLES DRAWN AEROBIC AND ANAEROBIC Blood Culture adequate volume   Culture   Final    NO GROWTH 3 DAYS Performed at [facility], [place] [address], [place], [place] [address]    Report Status PENDING  Incomplete      Imaging Studies   No results found.   Medications   Scheduled Meds: . Chlorhexidine Gluconate Cloth  6 each Topical Daily  . feeding supplement  237 mL Oral TID BM  . heparin  5,000 Units Subcutaneous Q8H  . multivitamin with minerals  1 tablet  Oral Daily  . pantoprazole  40 mg Oral Q0600   Continuous Infusions: . ampicillin-sulbactam (UNASYN) IV 3 g (02/15/21 0915)       LOS: 5 days    Time spent: 25 minutes with greater than 50% spent at bedside and in coordination of care.    Ezekiel Slocumb, DO [facility]  02/15/2021, 4:02 PM      If 7PM-7AM, please contact night-coverage. How to contact the [facility] Attending or Consulting provider [place] or covering provider during after hours [place], for this patient?    1. Check the care team in [facility] and look for a) attending/consulting [facility] provider listed and b) the [facility] team listed 2. Log into [email or web address] and use [place]'s universal password to access. If you do not have the password, please contact the hospital operator. 3. Locate the [facility] provider you are looking for under [facility] and page to a number that you can be directly reached. 4. If you still have difficulty reaching the provider, please page the [facility] (Director on Call) for the Hospitalists listed on amion for assistance.

## 2021-02-15 NOTE — Progress Notes (Signed)
  Speech Language Pathology Treatment: Dysphagia  Patient Details Name: Clinton [name] MRN: [id number] DOB: Sep 12, 1941 Today's Date: 02/15/2021 Time: [id number] SLP Time Calculation (min) (ACUTE ONLY): 10 min  Assessment / Plan / Recommendation Clinical Impression  SLP assessed during part of mechanical soft and thin liquid breakfast meal, nurse tech assisted with feeding. Positioned pt upright in bed. Nursing denies difficulty with current diet. Pt with prolonged mastication of softer textures but with extended time and multiple swallows able to adequately clear oral cavity. One instance of dry coughing exhibited with solids, as pt reports small piece of muffin caused cough. Pt consumed thin liquids via straw use without overt s/sx of aspiration. Continue current diet with safe swallowing precautions. No further ST needs identified.    HPI HPI: 80 y.o. male with medical history significant of coronary artery disease: GERD, Gilbert's syndrome, hypertension, spinal stenosis, osteoarthritis, who was recently admitted in February with AKI.  Patient is back again with complaint of vomiting blood.  He had nausea vomiting initially cleared but according to patient the last one appears to be coffee-ground. CT of chest with bibasilar airspace opacities most consistent with acute infiltrate. Possibility of aspiration would deserve consideration given the lower lobe predominance.      SLP Plan  Discharge SLP treatment due to (comment)       Recommendations  Diet recommendations: Dysphagia 3 (mechanical soft);Thin liquid Liquids provided via: Straw Medication Administration: Whole meds with puree Supervision: Staff to assist with self feeding;Full supervision/cueing for compensatory strategies Compensations: Slow rate;Minimize environmental distractions;Small sips/bites;Multiple dry swallows after each bite/sip Postural Changes and/or Swallow Maneuvers: Seated upright 90 degrees;Upright 30-60 min  after meal                Oral Care Recommendations: Oral care BID Follow up Recommendations: Skilled Nursing facility;24 hour supervision/assistance SLP Visit Diagnosis: Dysphagia, oral phase (R13.11);Dysphagia, unspecified (R13.10) Plan: Discharge SLP treatment due to (comment)       [place], CCC-SLP Acute Rehabilitation Services   02/15/2021, 9:09 AM

## 2021-02-16 DIAGNOSIS — R338 Other retention of urine: Secondary | ICD-10-CM | POA: Diagnosis not present

## 2021-02-16 DIAGNOSIS — A419 Sepsis, unspecified organism: Secondary | ICD-10-CM

## 2021-02-16 DIAGNOSIS — B9620 Unspecified Escherichia coli [E. coli] as the cause of diseases classified elsewhere: Secondary | ICD-10-CM

## 2021-02-16 DIAGNOSIS — Z7189 Other specified counseling: Secondary | ICD-10-CM | POA: Diagnosis not present

## 2021-02-16 DIAGNOSIS — N390 Urinary tract infection, site not specified: Secondary | ICD-10-CM | POA: Diagnosis not present

## 2021-02-16 DIAGNOSIS — D696 Thrombocytopenia, unspecified: Secondary | ICD-10-CM

## 2021-02-16 DIAGNOSIS — R5381 Other malaise: Secondary | ICD-10-CM

## 2021-02-16 DIAGNOSIS — N179 Acute kidney failure, unspecified: Secondary | ICD-10-CM | POA: Diagnosis not present

## 2021-02-16 DIAGNOSIS — R7881 Bacteremia: Secondary | ICD-10-CM

## 2021-02-16 DIAGNOSIS — Z515 Encounter for palliative care: Secondary | ICD-10-CM | POA: Diagnosis not present

## 2021-02-16 DIAGNOSIS — R6520 Severe sepsis without septic shock: Secondary | ICD-10-CM

## 2021-02-16 LAB — BASIC METABOLIC PANEL
Anion gap: 6 (ref 5–15)
BUN: 17 mg/dL (ref 8–23)
CO2: 29 mmol/L (ref 22–32)
Calcium: 7.5 mg/dL — ABNORMAL LOW (ref 8.9–10.3)
Chloride: 101 mmol/L (ref 98–111)
Creatinine, Ser: 1.19 mg/dL (ref 0.61–1.24)
GFR, Estimated: >60 mL/min (ref >60)
Glucose, Bld: 74 mg/dL (ref 70–99)
Potassium: 3.5 mmol/L (ref 3.5–5.1)
Sodium: 136 mmol/L (ref 135–145)

## 2021-02-16 LAB — MAGNESIUM: Magnesium: 2 mg/dL (ref 1.7–2.4)

## 2021-02-16 LAB — PHOSPHORUS: Phosphorus: 2.2 mg/dL — ABNORMAL LOW (ref 2.5–4.6)

## 2021-02-16 NOTE — [place] FL2 (Signed)
[place] MEDICAID FL2 LEVEL OF CARE SCREENING TOOL     IDENTIFICATION  Patient Name: Clinton [name] Birthdate: 1941/06/18 Sex: male Admission Date (Current Location): 02/10/2021  [facility] and [place] Number:  Herbalist and Address:  [facility], [place] [address], [place], [place] [address]      Provider Number: [id number]  Attending Physician Name and Address:  Mercy Riding, MD  Relative Name and Phone Number:  [place], Sison [phone])   [phone] [facility] Phone)    Current Level of Care: Hospital Recommended Level of Care: [place] Prior Approval Number: [id number]  Date Approved/Denied:   PASRR Number:    Discharge Plan: SNF    Current Diagnoses: Patient Active Problem List   Diagnosis Date Noted  . Protein-calorie malnutrition, severe 02/12/2021  . Bladder mass   . Acute lower UTI 02/10/2021  . Hematemesis 02/10/2021  . Thrombocytopenia ([place]) 01/25/2021  . Acute CHF (congestive heart failure) ([place]) 01/17/2021  . Hypertensive urgency 01/17/2021  . Bradycardia 01/17/2021  . Fall 01/17/2021  . Near syncope 01/17/2021  . AKI (acute kidney injury) ([place])   . Rotator cuff tear arthropathy of both shoulders 01/25/2016  . Degenerative arthritis of knee, bilateral 01/25/2016  . Status post lumbar spinal fusion 11/29/2014  . GERD (gastroesophageal reflux disease) 11/25/2014  . Spinal stenosis 11/24/2014  . CAD (coronary artery disease) 11/15/2014  . HTN (hypertension) 11/15/2014  . Hyperlipidemia 11/15/2014    Orientation RESPIRATION BLADDER Height & Weight     Self,Time,Situation,Place  Normal Incontinent Weight: 154 lb 12.2 oz (70.2 kg) Height:  5\' 10"  (177.8 cm)  BEHAVIORAL SYMPTOMS/MOOD NEUROLOGICAL BOWEL NUTRITION STATUS      Incontinent  (See discharge summary)  AMBULATORY STATUS COMMUNICATION OF NEEDS Skin   Extensive Assist Verbally                         Personal Care Assistance Level of Assistance   Bathing,Feeding,Dressing Bathing Assistance: Maximum assistance Feeding assistance: Limited assistance Dressing Assistance: Maximum assistance     Functional Limitations Info  Sight,Hearing,Speech Sight Info: Adequate Hearing Info: Impaired Speech Info: Adequate    SPECIAL CARE FACTORS FREQUENCY  PT (By licensed PT),OT (By licensed OT)     PT Frequency: 5x a week OT Frequency: 5x  aweek            Contractures Contractures Info: Not present    Additional Factors Info  Code Status,Allergies Code Status Info: Full Allergies Info: NKA           Current Medications (02/16/2021):  This is the current hospital active medication list Current Facility-Administered Medications  Medication Dose Route Frequency Provider Last Rate Last Admin  . acetaminophen (TYLENOL) tablet 650 mg  650 mg Oral Q6H PRN Elwyn Reach, MD   650 mg at 02/15/21 0130   Or  . acetaminophen (TYLENOL) suppository 650 mg  650 mg Rectal Q6H PRN Gala Romney L, MD      . Ampicillin-Sulbactam (UNASYN) 3 g in sodium chloride 0.9 % 100 mL IVPB  3 g Intravenous Q8H Henri Medal, RPH 200 mL/hr at 02/16/21 0738 3 g at 02/16/21 0738  . calcium carbonate (dosed in mg elemental calcium) suspension 500 mg of elemental calcium  500 mg of elemental calcium Oral Q6H PRN Elwyn Reach, MD      . camphor-menthol (SARNA) lotion 1 application  1 application Topical P8K PRN Elwyn Reach, MD  And  . hydrOXYzine (ATARAX/VISTARIL) tablet 25 mg  25 mg Oral Q8H PRN Elwyn Reach, MD      . Chlorhexidine Gluconate Cloth 2 % PADS 6 each  6 each Topical Daily Murlean Iba, MD   6 each at 02/16/21 0959  . docusate sodium (ENEMEEZ) enema 283 mg  1 enema Rectal PRN Gala Romney L, MD      . feeding supplement (ENSURE ENLIVE / ENSURE PLUS) liquid 237 mL  237 mL Oral TID BM Johnson, Clanford L, MD   237 mL at 02/15/21 1953  . heparin injection 5,000 Units  5,000 Units Subcutaneous Q8H Elwyn Reach,  MD   5,000 Units at 02/16/21 1336  . multivitamin with minerals tablet 1 tablet  1 tablet Oral Daily Wynetta Emery, Clanford L, MD   1 tablet at 02/16/21 0958  . ondansetron (ZOFRAN) tablet 4 mg  4 mg Oral Q6H PRN Elwyn Reach, MD       Or  . ondansetron (ZOFRAN) injection 4 mg  4 mg Intravenous Q6H PRN Gala Romney L, MD      . pantoprazole (PROTONIX) EC tablet 40 mg  40 mg Oral Q0600 Johnson, Clanford L, MD   40 mg at 02/16/21 0551  . sorbitol 70 % solution 30 mL  30 mL Oral PRN Gala Romney L, MD      . zolpidem (AMBIEN) tablet 5 mg  5 mg Oral QHS PRN Elwyn Reach, MD         Discharge Medications: Please see discharge summary for a list of discharge medications.  Relevant Imaging Results:  Relevant Lab Results:   Additional Information SSN [place], [place]

## 2021-02-16 NOTE — Progress Notes (Signed)
Physical Therapy Treatment Patient Details Name: Clinton [name] MRN: [id number] DOB: 02-Aug-1941 Today's Date: 02/16/2021    History of Present Illness Pt is 80 yo male presents to ED on 3/5 with coffee ground emesis, abdominal pain, UTI, aspiration PNA. Renal US shows large posterior bladder mass and mild L hydronephrosis, per urology suspect due to debri from severe infection and not a bladder mass.. Pt with recent admission 01/2021 for AKI with d/c to SNF,  falls at home. PMH includes coronary artery disease, GERD, Gilbert's syndrome, hypertension, spinal stenosis, osteoarthritis.    PT Comments    Pt remains very weak and requiring max x 2 transfer to EOB.  He had posterior and L lean that improved with a variety of facilitation techniques (see balance).  Pt was too weak and fatigued to attempt standing.  Pt stating "I won't go to another nursing home."  Tried to discuss with pt that he is requiring total A at this time and would be very difficult for him and family at home.     Follow Up Recommendations  SNF;Supervision/Assistance - 24 hour     Equipment Recommendations  Rolling walker with 5" wheels;3in1 (PT);Wheelchair (measurements PT);Wheelchair cushion (measurements PT);Hospital bed (hoyer)    Recommendations for Other Services       Precautions / Restrictions Precautions Precautions: Fall Precaution Comments: Fall prior to admission    Mobility  Bed Mobility Overal bed mobility: Needs Assistance Bed Mobility: Rolling;Sidelying to Sit;Sit to Sidelying Rolling: Max assist;+2 for physical assistance Sidelying to sit: Max assist;+2 for physical assistance     Sit to sidelying: +2 for physical assistance;Max assist General bed mobility comments: Pt with very littly ability to assist.  He minimally assisted with sliding legs to EOB but then required max x 2 for legs and trunks.  Max x 2 back to bed.  Bed pad to facilitate all    Transfers                  General transfer comment: unable - too weak  Ambulation/Gait                 Stairs             Wheelchair Mobility    Modified Rankin (Stroke Patients Only)       Balance Overall balance assessment: Needs assistance;History of Falls Sitting-balance support: Feet supported;Bilateral upper extremity supported Sitting balance-Leahy Scale: Poor Sitting balance - Comments: Pt initially with L and posterior lean.  Worked on leaning forward to target, placing hands on knee to improve posterior lean.  Then provided visual cues and assist for L lean with some ability to correct.  Had pt lean onto R elbow with assist and this further improved L lean.  THen worked on R trunk rotation and reaching across body and that offered best improvement in L lean.  Sat EOB ~10 mins then he stated exhausted and wanted to return to supine. Postural control: Left lateral lean;Posterior lean   Standing balance-Leahy Scale: Zero                              Cognition Arousal/Alertness: Awake/alert Behavior During Therapy: Flat affect Overall Cognitive Status: No family/caregiver present to determine baseline cognitive functioning Area of Impairment: Following commands;Safety/judgement;Problem solving                 Orientation Level: Disoriented to;Situation;Time       Safety/Judgement: Decreased  awareness of deficits;Decreased awareness of safety   Problem Solving: Requires verbal cues;Requires tactile cues General Comments: Pt with decreased awareness and insight into deficits.  He feels he can go home at discharge and refuses SNF.      Exercises General Exercises - Lower Extremity Ankle Circles/Pumps: AAROM;Both;10 reps;Supine Long Arc Quad: AAROM;Both;10 reps;Seated (lacked terminal ext) Heel Slides: AAROM;Both;10 reps;Seated Other Exercises Other Exercises: Neck rotation and lateral flexion to R gentle stretch    General Comments General comments (skin  integrity, edema, etc.): VSs      Pertinent Vitals/Pain Pain Assessment: Faces Faces Pain Scale: Hurts a little bit Pain Location: generalized--stiff Pain Descriptors / Indicators: Grimacing;Sore Pain Intervention(s): Limited activity within patient's tolerance;Monitored during session    Home Living                      Prior Function            PT Goals (current goals can now be found in the care plan section) Acute Rehab PT Goals Patient Stated Goal: I won't go to another nursing home PT Goal Formulation: With patient Time For Goal Achievement: 02/28/21 Potential to Achieve Goals: Good Progress towards PT goals: Not progressing toward goals - comment (very weak)    Frequency    Min 2X/week      PT Plan Current plan remains appropriate    Co-evaluation              AM-PAC PT "6 Clicks" Mobility   Outcome Measure  Help needed turning from your back to your side while in a flat bed without using bedrails?: Total Help needed moving from lying on your back to sitting on the side of a flat bed without using bedrails?: Total Help needed moving to and from a bed to a chair (including a wheelchair)?: Total Help needed standing up from a chair using your arms (e.g., wheelchair or bedside chair)?: Total Help needed to walk in hospital room?: Total Help needed climbing 3-5 steps with a railing? : Total 6 Click Score: 6    End of Session Equipment Utilized During Treatment: Gait belt Activity Tolerance: Patient limited by fatigue Patient left: in bed;with call bell/phone within reach;with bed alarm set Nurse Communication: Mobility status PT Visit Diagnosis: History of falling (Z91.81);Other abnormalities of gait and mobility (R26.89);Muscle weakness (generalized) (M62.81)     Time: 6948-5462 PT Time Calculation (min) (ACUTE ONLY): 26 min  Charges:  $Therapeutic Activity: 8-22 mins $Neuromuscular Re-education: 8-22 mins                     Abran Richard,  PT Acute Rehab Services Pager [phone] [facility] 02/16/2021, 6:18 PM

## 2021-02-16 NOTE — TOC Initial Note (Signed)
Transition of Care [facility]) - Initial/Assessment Note    Patient Details  Name: Clinton [name] MRN: [id number] Date of Birth: 06-26-1941  Transition of Care [facility]) CM/SW Contact:    Emeterio Reeve, [place] Phone Number: 02/16/2021, 12:02 PM  Clinical Narrative:                  CSW spoke to pts son [name] on phone. [name] states pt was living at home alone PTA. [name] reports pt required some assistance with ADL's and mobility. Pt was recently at [facility], discharged and came to hospital same day. CSW explained that since pt has recently at SNF that he may be in copay days. [name] stated he understands. CSW also requested [name] and family to think about plan B.   [name] also shared that he is healthcare POA and there are restrictions in place for him to be able to make decisions for his dad. [name] stated that a MD would have to say pt does not have capacity. CSW shared with MD.   Expected Discharge Plan: [place] Barriers to Discharge: Continued Medical Work up,Insurance Authorization   Patient Goals and CMS Choice Patient states their goals for this hospitalization and ongoing recovery are:: to get better CMS Medicare.gov Compare Post Acute Care list provided to:: Patient Choice offered to / list presented to : Adult Children  Expected Discharge Plan and Services Expected Discharge Plan: [place]       Living arrangements for the past 2 months: Apartment                                      Prior Living Arrangements/Services Living arrangements for the past 2 months: Apartment Lives with:: Self Patient language and need for interpreter reviewed:: Yes Do you feel safe going back to the place where you live?: Yes      Need for Family Participation in Patient Care: Yes (Comment) Care giver support system in place?: Yes (comment)   Criminal Activity/Legal Involvement Pertinent to Current Situation/Hospitalization: No - Comment as  needed  Activities of Daily Living Home Assistive Devices/Equipment: Wheelchair ADL Screening (condition at time of admission) Patient's cognitive ability adequate to safely complete daily activities?: No Is the patient deaf or have difficulty hearing?: No Does the patient have difficulty seeing, even when wearing glasses/contacts?: No Does the patient have difficulty concentrating, remembering, or making decisions?: Yes Patient able to express need for assistance with ADLs?: Yes Does the patient have difficulty dressing or bathing?: Yes Independently performs ADLs?: No Communication: Independent Dressing (OT): Needs assistance Is this a change from baseline?: Pre-admission baseline Grooming: Needs assistance Feeding: Needs assistance Bathing: Needs assistance Toileting: Needs assistance In/Out Bed: Dependent Does the patient have difficulty walking or climbing stairs?: Yes Weakness of Legs: Both Weakness of Arms/Hands: Both  Permission Sought/Granted Permission sought to share information with : Family Chief Financial Officer Permission granted to share information with : Yes, Verbal Permission Granted     Permission granted to share info w AGENCY: SNF  Permission granted to share info w Relationship: Son, [name]     Emotional Assessment Appearance:: Appears stated age Attitude/Demeanor/Rapport: Unable to Assess Affect (typically observed): Unable to Assess Orientation: : Oriented to Self Alcohol / Substance Use: Not Applicable Psych Involvement: No (comment)  Admission diagnosis:  Hypoxemia [R09.02] Physical deconditioning [R53.81] AKI (acute kidney injury) ([place]) [N17.9] Acute kidney injury ([place]) [N17.9] Hypotensive  episode [I95.9] Sepsis, due to unspecified organism, unspecified whether acute organ dysfunction present [facility]) [A41.9] Patient Active Problem List   Diagnosis Date Noted  . Protein-calorie malnutrition, severe 02/12/2021  . Bladder mass    . Acute lower UTI 02/10/2021  . Hematemesis 02/10/2021  . Thrombocytopenia ([place]) 01/25/2021  . Acute CHF (congestive heart failure) ([place]) 01/17/2021  . Hypertensive urgency 01/17/2021  . Bradycardia 01/17/2021  . Fall 01/17/2021  . Near syncope 01/17/2021  . AKI (acute kidney injury) ([place])   . Rotator cuff tear arthropathy of both shoulders 01/25/2016  . Degenerative arthritis of knee, bilateral 01/25/2016  . Status post lumbar spinal fusion 11/29/2014  . GERD (gastroesophageal reflux disease) 11/25/2014  . Spinal stenosis 11/24/2014  . CAD (coronary artery disease) 11/15/2014  . HTN (hypertension) 11/15/2014  . Hyperlipidemia 11/15/2014   PCP:  [name], NP Pharmacy:   [facility] [place], [address] AT [address] [place] [address] Phone: [phone] Fax: [place] [address] [place], [place] - [address] [place] [address] [place] [address] Phone: [phone] Fax: [phone]     Social Determinants of Health (SDOH) Interventions    Readmission Risk Interventions No flowsheet data found.  Emeterio Reeve, Latanya Presser, [place] Social Worker [phone]

## 2021-02-16 NOTE — Progress Notes (Signed)
Palliative Medicine Inpatient Follow Up Note  Reason for consult:  Goals of Care  HPI:  Per intake H&P --> 80 y.o.malewith medical history significant ofcoronary artery disease: GERD, Gilbert's syndrome, hypertension, spinal stenosis, osteoarthritis, who was recently admitted in February with AKI. Patient is back again with complaint of vomiting blood. He had nausea vomiting initially cleared but according to patient the last oneappears to be coffee-ground.  Palliative care was asked to get involved to address goals of care.  Today's Discussion (02/16/2021):  *Please note that this is a verbal dictation therefore any spelling or grammatical errors are due to the "[employer] [employer]" system interpretation.  Chart reviewed. I met with Clinton [name] this morning. He shares with me that he is frustrated at the present situation with his son and daughter in law. I asked him to tell me more about what he meant by this. He states that he believes his son is trying to put him in a nursing home and he has been clear about never wanting this. We reviewed that he does have rehabilitation needs that may exceed what is able to be done at home. He endorses that he believes his daughter in law is not fond of him and this is what is driving this discussion. I asked him if he did go home who could provide the 24/7 support that he would need. Clinton [name] does not feel that he requires full care. I shared with him that at this time it seems that he does in order to remain safe.  We further discussed code status which Clinton [name] remains adamant that he wants to donate "10 of his organs". We reviewed that based upon his age and co-morbidities that he would likely not be deemed a transplant candidate. He stated understanding of this and then says he wants to provide his body to science. We discussed that he does not need to be full code to do this. He goes on to say that he "doesn't care" and wants "everything done" to preserve  his life.   Clinton [name] and I talked for some time thereafter as he requires ongoing emotional support in the setting of his declining health state.   Discussed the importance of continued conversation with family and their  medical providers regarding overall plan of care and treatment options, ensuring decisions are within the context of the patients values and GOCs.  Provided "Hard Choices for [employer]" booklet.   Questions and concerns addressed   Objective Assessment: Vital Signs Vitals:   02/15/21 1950 02/16/21 0452  BP: 122/61 100/70  Pulse: 68 66  Resp: 17 18  Temp: 97.7 F (36.5 C) 98.2 F (36.8 C)  SpO2: 98% 99%    Intake/Output Summary (Last 24 hours) at 02/16/2021 1108 Last data filed at 02/16/2021 0454 Gross per 24 hour  Intake 100 ml  Output 1200 ml  Net -1100 ml   Last Weight  Most recent update: 02/14/2021  5:35 AM   Weight  70.2 kg (154 lb 12.2 oz)           Gen:  Elderly caucasian M with flat affect HEENT: dry mucous membranes CV: Regular rate and rhythm  PULM: ON RA, clear to auscultation bilaterally  ABD: soft/nontender  EXT: (+) muscle wasting, (+) pedal edema Neuro: Alert and oriented x3   SUMMARY OF RECOMMENDATIONS Full Code  Patient has HCPOA and Living will on file - Pls review desire for natural death if he declines further  MSW to aid son in  identifying what to fill out for medicaid  Patient shares with me that he does not want to go to a nursing home but realizes he cannot care for himself at home  OP Palliative support will be needed on discharge  Ongoing incremental PMT support  Time Spent: 35 Greater than 50% of the time was spent in counseling and coordination of care ______________________________________________________________________________________ [place] Team Team Cell Phone: [phone] Please utilize secure chat with additional questions, if there is no response within  30 minutes please call the above phone number  Palliative Medicine Team providers are available by phone from 7am to 7pm daily and can be reached through the team cell phone.  Should this patient require assistance outside of these hours, please call the patient's attending physician.

## 2021-02-16 NOTE — Evaluation (Signed)
Occupational Therapy Evaluation Patient Details Name: Clinton [name] MRN: [id number] DOB: [birth year] Today's Date: 02/16/2021    History of Present Illness 80 yo male presents to ED on 3/5 with coffee ground emesis, abdominal pain, UTI, aspiration PNA. Renal US shows large posterior bladder mass and mild L hydronephrosis, per urology suspect due to debri from severe infection and not a bladder mass. Pt with recent SNF admission, d/c 3/1. Pt with recent admission 01/2021 for AKI, falls at home. PMH includes coronary artery disease, GERD, Gilbert's syndrome, hypertension, spinal stenosis, osteoarthritis.   Clinical Impression   This 80 yo male admitted with above presents to acute OT with living at home by himself with an aid 3 days/week for 2 hours/day. Per pt aid A'd him with bathing and dressing and he could walk with a RW all on his own. Currently he cannot even roll in bed by himself (Mod A), has limited use of arms (weaker proximally), and stiff LEs all affecting his ability to mobilized, do ADLs or even A with ADLs. He will continue to benefit form acute OT with follow up at SNF.    Follow Up Recommendations  SNF;Supervision/Assistance - 24 hour    Equipment Recommendations  Other (comment) (TBD next venue)       Precautions / Restrictions Precautions Precautions: Fall Restrictions Weight Bearing Restrictions: No      Mobility Bed Mobility Overal bed mobility: Needs Assistance Bed Mobility: Rolling Rolling: Mod assist         General bed mobility comments: Had to A pt with bending up knees, reaching with opposite UE to opposite rail (he would initiate but could not reach all the way),and rolling at hips           ADL either performed or assessed with clinical judgement   ADL Overall ADL's : Needs assistance/impaired Eating/Feeding: Total assistance Eating/Feeding Details (indicate cue type and reason): if trying to eat from tray table and not positioned well; he  has very little strength at his shoulders thus making it hard for him to reach up or forward to grab cups, utensils, or food off of tray table Grooming: Wash/dry face;Set up;Bed level   Upper Body Bathing: Maximal assistance;Bed level   Lower Body Bathing: Total assistance;Bed level   Upper Body Dressing : Total assistance;Bed level   Lower Body Dressing: Total assistance;Bed level                       Vision Patient Visual Report: No change from baseline              Pertinent Vitals/Pain Pain Assessment: Faces Faces Pain Scale: Hurts little more Pain Location: generalized--stiff Pain Descriptors / Indicators: Grimacing;Sore Pain Intervention(s): Limited activity within patient's tolerance;Monitored during session;Repositioned     Hand Dominance Left   Extremity/Trunk Assessment Upper Extremity Assessment Upper Extremity Assessment: RUE deficits/detail;LUE deficits/detail RUE Deficits / Details: Can move hand to mouth from lap, but cannot without A raise arm at shoulder as well as cannot hold arm in air when helped to rais arm to 90 degrees of flexion (does not drop fast but cannot maintain arm up) 3/5 elbow distally RUE Coordination: decreased gross motor LUE Deficits / Details: Can move hand to mouth from lap, but cannot without A raise arm at shoulder as well as cannot hold arm in air when helped to rais arm to 90 degrees of flexion (does not drop fast but cannot maintain arm up) 3/5 elbow distally LUE  Coordination: decreased gross motor           Communication Communication Communication:  (monotone, difficult to understand at times)   Cognition Arousal/Alertness: Awake/alert Behavior During Therapy: Flat affect Overall Cognitive Status: No family/caregiver present to determine baseline cognitive functioning Area of Impairment: Following commands;Safety/judgement;Problem solving                       Following Commands: Follows one step  commands with increased time Safety/Judgement: Decreased awareness of deficits   Problem Solving: Requires verbal cues;Requires tactile cues                Home Living Family/patient expects to be discharged to:: Private residence Living Arrangements: Alone Available Help at Discharge: Family;Personal care attendant;Available PRN/intermittently Type of Home: Apartment Home Access: Stairs to enter Entrance Stairs-Number of Steps: 1 (threshold)   Home Layout: One level     Bathroom Shower/Tub: Corporate investment banker: Standard     Home Equipment: Tub bench;Walker - 2 wheels;Hand held shower head (life alert)   Additional Comments: Aide comes 3 days/week for 2 hours      Prior Functioning/Environment Level of Independence: Needs assistance  Gait / Transfers Assistance Needed: Uses RW for mobility ADL's / Homemaking Assistance Needed: Pt reports he can bath himself but it would take too long so the aid helps him; he also reports he can dress himself but again it takes too long so the aid helps him. He reports he can ambulate on his own with RW            OT Problem List: Decreased strength;Decreased range of motion;Impaired balance (sitting and/or standing);Decreased cognition;Impaired UE functional use      OT Treatment/Interventions: Self-care/ADL training;Therapeutic exercise;DME and/or AE instruction;Patient/family education;Balance training    OT Goals(Current goals can be found in the care plan section) Acute Rehab OT Goals Patient Stated Goal: I won't go to another nursing home OT Goal Formulation: With patient Time For Goal Achievement: 03/02/21 Potential to Achieve Goals: Fair  OT Frequency: Min 2X/week   Barriers to D/C: Decreased caregiver support  Pt has limited Aide A; otherwise he is home alone          AM-PAC OT "6 Clicks" Daily Activity     Outcome Measure Help from another person eating meals?: Total Help from another person  taking care of personal grooming?: A Lot Help from another person toileting, which includes using toliet, bedpan, or urinal?: Total Help from another person bathing (including washing, rinsing, drying)?: Total Help from another person to put on and taking off regular upper body clothing?: Total Help from another person to put on and taking off regular lower body clothing?: Total 6 Click Score: 7   End of Session Nurse Communication:  (had a message about providing pt with feeding utensils/cup due to OA hands--hands determined not issue. Issues are weak shoulders, positioning of patient, and positioning of food for pt to access.)  Activity Tolerance: Patient tolerated treatment well Patient left: in bed;with call bell/phone within reach (do not see bed alarm on size wise bed)  OT Visit Diagnosis: Other abnormalities of gait and mobility (R26.89);Muscle weakness (generalized) (M62.81);Other symptoms and signs involving cognitive function                Time: 9373-4287 OT Time Calculation (min): 26 min Charges:  OT General Charges $OT Visit: 1 Visit OT Evaluation $OT Eval Moderate Complexity: 1 Mod OT Treatments $Self Care/Home Management :  8-22 mins  Golden Circle, OTR/L [facility] [employer] Pager [phone] Office [phone]     Almon Register 02/16/2021, 11:38 AM

## 2021-02-16 NOTE — Progress Notes (Signed)
PROGRESS NOTE  Clinton [name] RFF:[id number] DOB: Jan 02, 1941   PCP: [name], NP  Patient is from: Home  DOA: 02/10/2021 LOS: 6  Chief complaints: Hematemesis  Brief Narrative / Interim history: 80 year old male with PMH of CAD, HTN, spinal stenosis, osteoarthritis, Gilbert's syndrome, GERD, debility, chronic urinary tension with bilateral hydronephrosis and recent hospitalization in 01/2021 with AKI presenting with coffee-ground emesis, and admitted for AKI, E. coli bacteremia and UTI, as well as possible aspiration pneumonia.  Patient was treated with IV fluid with improvement in his AKI.  He was also treated for E. coli UTI and bacteremia with improvement in his symptoms.  However, he remains debilitated and therapy recommended SNF.  Of note, patient went home from SNF about a week prior to presentation.  Subjective: Seen and examined earlier this morning.  No major events overnight of this morning.  He has no complaints but doesn't want to go to SNF.  He says he would rather go home with home health first. He says he would go "somewhere" if that doesn't work.  He is fairly oriented but no poor insight into why he is in the hospital. He says "I have no dementia". He denies chest pain, dyspnea or GI symptoms.  Objective: Vitals:   02/15/21 0500 02/15/21 1950 02/16/21 0452 02/16/21 1259  BP: 119/65 122/61 100/70 108/77  Pulse: 71 68 66 92  Resp: 17 17 18 16   Temp: 97.8 F (36.6 C) 97.7 F (36.5 C) 98.2 F (36.8 C) 98.7 F (37.1 C)  TempSrc: Oral   Oral  SpO2: 96% 98% 99% 98%  Weight:      Height:        Intake/Output Summary (Last 24 hours) at 02/16/2021 1548 Last data filed at 02/16/2021 1300 Gross per 24 hour  Intake 437 ml  Output 1200 ml  Net -763 ml   Filed Weights   02/11/21 0500 02/13/21 0537 02/14/21 0456  Weight: 67.9 kg 70.4 kg 70.2 kg    Examination:  GENERAL: No apparent distress.  Nontoxic. HEENT: MMM.  Vision and hearing grossly intact.  NECK:  Supple.  No apparent JVD.  RESP:  No IWOB.  Fair aeration bilaterally. CVS:  RRR. Heart sounds normal.  ABD/GI/GU: BS+. Abd soft, NTND.  Indwelling Foley catheter MSK/EXT:  Moves extremities. No apparent deformity. No edema.  SKIN: no apparent skin lesion or wound NEURO: Awake and alert.  Oriented to self, place and time except date but not to why he is in the hospital.  No apparent focal neuro deficit but generalized weakness more pronounced in lower extremities. PSYCH: Calm. Normal affect.   Procedures:  None  Microbiology summarized: 3/5-COVID-19 and influenza PCR nonreactive 3/5-blood culture with pansensitive E. Coli 3/5-urine culture with pansensitive E. Coli  Assessment & Plan: Severe sepsis due to E. coli bacteremia and E. coli UTI: POA.  Met criteria with leukocytosis, tachypnea,, AKI, hypotension and lactic acidosis.  Blood and urine cultures with pansensitive E. Coli. -IV Zosyn on 3/5> IV ceftriaxone through 3/8> IV Ancef through 3/9> IV Unasyn  Dysphagia with possible aspiration pneumonia -Antibiotics broadened to IV Unasyn. -SLP recommended dysphagia 3 diet  Acute kidney injury with azotemia: Likely a combination of prerenal from dehydration and ATN from sepsis and postobstructive uropathy in the setting of urinary retention.  He is also on lisinopril at home.  Resolved. Recent Labs    01/18/21 0421 01/19/21 3570 01/20/21 1779 02/10/21 1437 02/10/21 2000 02/11/21 0045 02/12/21 0155 02/13/21 0007 02/14/21 0203 02/15/21 0059 02/16/21  0119  BUN 30* 26* 18 58*  --  62* 52* 40* 28* 21 17  CREATININE 1.74* 1.35* 1.19 2.71* 2.75* 2.58* 2.17* 1.61* 1.33* 1.26* 1.19  -Avoid nephrotoxic meds -Continue indwelling Foley catheter on discharge per urology recommendation -Outpatient follow-up with urology in 1 to 2 weeks  Chronic urine retention with bilateral hydronephrosis -Urology recommendation as above  Essential hypertension: Normotensive -Continue holding home  amlodipine and lisinopril  Thrombocytopenia: Likely due to sepsis.  Improving. Recent Labs  Lab 02/10/21 1437 02/10/21 2000 02/11/21 0045 02/12/21 0155 02/13/21 0007 02/14/21 0203  PLT 184 150 146* 109* 112* 119*   Debility/functional quadriplegia-generalized weakness, more pronounced in lower extremities -Therapy recommended SNF but patient is not interesting. However, family understandably worried about patient being at home on his own. He is also in   Goal of care-significant comorbidity as above. -Appreciate input by palliative-full code with full scope of care  Severe malnutrition Body mass index is 22.21 kg/m. Nutrition Problem: Severe Malnutrition Etiology: acute illness (AKI) Signs/Symptoms: severe muscle depletion,severe fat depletion Interventions: Ensure Enlive (each supplement provides 350kcal and 20 grams of protein),Magic cup,MVI Pressure Injury 01/19/21 Buttocks Posterior Unblanchable Redness bilateral Buttocks (Active)  01/19/21 1324  Location: Buttocks  Location Orientation: Posterior  Staging:   Wound Description (Comments): Unblanchable Redness bilateral Buttocks  Present on Admission: Yes   DVT prophylaxis:  heparin injection 5,000 Units Start: 02/10/21 2200  Code Status: Full code Family Communication: Updated patient's son over the phone. Level of care: Med-Surg Status is: Inpatient  Remains inpatient appropriate because:Unsafe d/c plan, IV treatments appropriate due to intensity of illness or inability to take PO and Inpatient level of care appropriate due to severity of illness   Dispo: The patient is from: Home              Anticipated d/c is to: SNF              Patient currently is not medically stable to d/c.   Difficult to place patient No       Consultants:  Urology   Sch Meds:  Scheduled Meds: . Chlorhexidine Gluconate Cloth  6 each Topical Daily  . feeding supplement  237 mL Oral TID BM  . heparin  5,000 Units Subcutaneous  Q8H  . multivitamin with minerals  1 tablet Oral Daily  . pantoprazole  40 mg Oral Q0600   Continuous Infusions: . ampicillin-sulbactam (UNASYN) IV 3 g (02/16/21 0738)   PRN Meds:.acetaminophen **OR** acetaminophen, calcium carbonate (dosed in mg elemental calcium), camphor-menthol **AND** hydrOXYzine, docusate sodium, ondansetron **OR** ondansetron (ZOFRAN) IV, sorbitol, zolpidem  Antimicrobials: Anti-infectives (From admission, onward)   Start     Dose/Rate Route Frequency Ordered Stop   02/14/21 1500  Ampicillin-Sulbactam (UNASYN) 3 g in sodium chloride 0.9 % 100 mL IVPB        3 g 200 mL/hr over 30 Minutes Intravenous Every 8 hours 02/14/21 1413     02/13/21 1500  ceFAZolin (ANCEF) IVPB 2g/100 mL premix  Status:  Discontinued        2 g 200 mL/hr over 30 Minutes Intravenous Every 8 hours 02/13/21 1414 02/14/21 1356   02/11/21 2300  cefTRIAXone (ROCEPHIN) 2 g in sodium chloride 0.9 % 100 mL IVPB  Status:  Discontinued        2 g 200 mL/hr over 30 Minutes Intravenous Every 24 hours 02/11/21 1420 02/13/21 1414   02/10/21 2300  cefTRIAXone (ROCEPHIN) 1 g in sodium chloride 0.9 % 100 mL IVPB  Status:  Discontinued        1 g 200 mL/hr over 30 Minutes Intravenous Every 24 hours 02/10/21 2214 02/11/21 1420   02/10/21 1845  piperacillin-tazobactam (ZOSYN) IVPB 3.375 g        3.375 g 100 mL/hr over 30 Minutes Intravenous  Once 02/10/21 1832 02/10/21 2023       I have personally reviewed the following labs and images: CBC: Recent Labs  Lab 02/10/21 2000 02/11/21 0045 02/12/21 0155 02/13/21 0007 02/14/21 0203  WBC 8.8 7.7 7.4 8.7 6.3  HGB 15.5 13.1 11.8* 12.2* 11.1*  HCT 47.3 37.0* 35.5* 35.7* 32.0*  MCV 97.1 92.5 95.4 94.7 94.4  PLT 150 146* 109* 112* 119*   BMP &GFR Recent Labs  Lab 02/12/21 0155 02/13/21 0007 02/14/21 0203 02/15/21 0059 02/16/21 0119  NA 139 136 135 135 136  K 3.1* 3.0* 3.3* 3.6 3.5  CL 98 95* 94* 96* 101  CO2 29 30 32 31 29  GLUCOSE 84 124* 99  84 74  BUN 52* 40* 28* 21 17  CREATININE 2.17* 1.61* 1.33* 1.26* 1.19  CALCIUM 8.3* 7.8* 7.4* 7.5* 7.5*  MG 1.7 2.6* 2.2 2.0 2.0  PHOS 1.9* 1.4* 1.8* 2.3* 2.2*   Estimated Creatinine Clearance: 50 mL/min (by C-G formula based on SCr of 1.19 mg/dL). Liver & Pancreas: Recent Labs  Lab 02/11/21 0045 02/12/21 0155 02/13/21 0007 02/14/21 0203 02/15/21 0059  AST 16 15 21 21 22   ALT 25 17 17 15 11   ALKPHOS 72 55 53 46 49  BILITOT 1.0 0.8 0.7 0.6 0.9  PROT 4.8* 4.7* 4.8* 4.4* 4.7*  ALBUMIN 2.1* 1.9* 1.9* 1.7* 1.9*   No results for input(s): LIPASE, AMYLASE in the last 168 hours. No results for input(s): AMMONIA in the last 168 hours. Diabetic: No results for input(s): HGBA1C in the last 72 hours. No results for input(s): GLUCAP in the last 168 hours. Cardiac Enzymes: No results for input(s): CKTOTAL, CKMB, CKMBINDEX, TROPONINI in the last 168 hours. No results for input(s): PROBNP in the last 8760 hours. Coagulation Profile: Recent Labs  Lab 02/10/21 1635  INR 1.1   Thyroid Function Tests: No results for input(s): TSH, T4TOTAL, FREET4, T3FREE, THYROIDAB in the last 72 hours. Lipid Profile: No results for input(s): CHOL, HDL, LDLCALC, TRIG, CHOLHDL, LDLDIRECT in the last 72 hours. Anemia Panel: No results for input(s): VITAMINB12, FOLATE, FERRITIN, TIBC, IRON, RETICCTPCT in the last 72 hours. Urine analysis:    Component Value Date/Time   COLORURINE AMBER (A) 02/10/2021 1814   APPEARANCEUR CLOUDY (A) 02/10/2021 1814   LABSPEC 1.010 02/10/2021 1814   PHURINE 6.0 02/10/2021 1814   GLUCOSEU NEGATIVE 02/10/2021 1814   HGBUR MODERATE (A) 02/10/2021 1814   BILIRUBINUR NEGATIVE 02/10/2021 1814   BILIRUBINUR n 10/12/2015 1553   KETONESUR NEGATIVE 02/10/2021 1814   PROTEINUR 100 (A) 02/10/2021 1814   UROBILINOGEN 0.2 10/12/2015 1553   UROBILINOGEN 0.2 11/11/2014 1524   NITRITE NEGATIVE 02/10/2021 1814   LEUKOCYTESUR LARGE (A) 02/10/2021 1814   Sepsis Labs: Invalid  input(s): PROCALCITONIN, [place]  Microbiology: Recent Results (from the past 240 hour(s))  Resp Panel by RT-PCR (Flu A&B, Covid) Nasopharyngeal Swab     Status: None   Collection Time: 02/10/21  4:26 PM   Specimen: Nasopharyngeal Swab; Nasopharyngeal(NP) swabs in vial transport medium  Result Value Ref Range Status   SARS Coronavirus 2 by RT PCR NEGATIVE NEGATIVE Final    Comment: (NOTE) SARS-CoV-2 target nucleic acids are NOT DETECTED.  The SARS-CoV-2  RNA is generally detectable in upper respiratory specimens during the acute phase of infection. The lowest concentration of SARS-CoV-2 viral copies this assay can detect is 138 copies/mL. A negative result does not preclude SARS-Cov-2 infection and should not be used as the sole basis for treatment or other patient management decisions. A negative result may occur with  improper specimen collection/handling, submission of specimen other than nasopharyngeal swab, presence of viral mutation(s) within the areas targeted by this assay, and inadequate number of viral copies(<138 copies/mL). A negative result must be combined with clinical observations, patient history, and epidemiological information. The expected result is Negative.  Fact Sheet for Patients:  EntrepreneurPulse.com.au  Fact Sheet for Healthcare Providers:  IncredibleEmployment.be  This test is no t yet approved or cleared by the [place] FDA and  has been authorized for detection and/or diagnosis of SARS-CoV-2 by FDA under an Emergency Use Authorization (EUA). This EUA will remain  in effect (meaning this test can be used) for the duration of the COVID-19 declaration under Section 564(b)(1) of the Act, 21 U.S.C.section 360bbb-3(b)(1), unless the authorization is terminated  or revoked sooner.       Influenza A by PCR NEGATIVE NEGATIVE Final   Influenza B by PCR NEGATIVE NEGATIVE Final    Comment: (NOTE) The Xpert  Xpress SARS-CoV-2/FLU/RSV plus assay is intended as an aid in the diagnosis of influenza from Nasopharyngeal swab specimens and should not be used as a sole basis for treatment. Nasal washings and aspirates are unacceptable for Xpert Xpress SARS-CoV-2/FLU/RSV testing.  Fact Sheet for Patients: EntrepreneurPulse.com.au  Fact Sheet for Healthcare Providers: IncredibleEmployment.be  This test is not yet approved or cleared by the [place] FDA and has been authorized for detection and/or diagnosis of SARS-CoV-2 by FDA under an Emergency Use Authorization (EUA). This EUA will remain in effect (meaning this test can be used) for the duration of the COVID-19 declaration under Section 564(b)(1) of the Act, 21 U.S.C. section 360bbb-3(b)(1), unless the authorization is terminated or revoked.  Performed at [facility], [place] [address] [place], [place] [address]   Culture, blood (routine x 2)     Status: None   Collection Time: 02/10/21  4:35 PM   Specimen: BLOOD  Result Value Ref Range Status   Specimen Description BLOOD SITE NOT SPECIFIED  Final   Special Requests   Final    BOTTLES DRAWN AEROBIC AND ANAEROBIC Blood Culture adequate volume   Culture   Final    NO GROWTH 5 DAYS Performed at [facility], [address], [place], [place] [address]    Report Status 02/15/2021 FINAL  Final  Culture, blood (routine x 2)     Status: Abnormal   Collection Time: 02/10/21  4:47 PM   Specimen: BLOOD  Result Value Ref Range Status   Specimen Description BLOOD SITE NOT SPECIFIED  Final   Special Requests   Final    BOTTLES DRAWN AEROBIC AND ANAEROBIC Blood Culture adequate volume   Culture  Setup Time   Final    GRAM NEGATIVE RODS ANAEROBIC BOTTLE ONLY CRITICAL RESULT CALLED TO, READ BACK BY AND VERIFIED WITH: C,PIERCE PHARMD @1403  02/11/21 EB Performed at [facility], [place] [address] [place], [place] [address]    Culture  ESCHERICHIA COLI (A)  Final   Report Status 02/14/2021 FINAL  Final   Organism ID, Bacteria ESCHERICHIA COLI  Final      Susceptibility   Escherichia coli - MIC*  AMPICILLIN 4 SENSITIVE Sensitive     CEFAZOLIN <=4 SENSITIVE Sensitive     CEFEPIME <=0.12 SENSITIVE Sensitive     CEFTAZIDIME <=1 SENSITIVE Sensitive     CEFTRIAXONE <=0.25 SENSITIVE Sensitive     CIPROFLOXACIN <=0.25 SENSITIVE Sensitive     GENTAMICIN <=1 SENSITIVE Sensitive     IMIPENEM <=0.25 SENSITIVE Sensitive     TRIMETH/SULFA <=20 SENSITIVE Sensitive     AMPICILLIN/SULBACTAM 4 SENSITIVE Sensitive     PIP/TAZO <=4 SENSITIVE Sensitive     * ESCHERICHIA COLI  Blood Culture ID Panel (Reflexed)     Status: Abnormal   Collection Time: 02/10/21  4:47 PM  Result Value Ref Range Status   Enterococcus faecalis NOT DETECTED NOT DETECTED Final   Enterococcus Faecium NOT DETECTED NOT DETECTED Final   Listeria monocytogenes NOT DETECTED NOT DETECTED Final   Staphylococcus species NOT DETECTED NOT DETECTED Final   Staphylococcus aureus (BCID) NOT DETECTED NOT DETECTED Final   Staphylococcus epidermidis NOT DETECTED NOT DETECTED Final   Staphylococcus lugdunensis NOT DETECTED NOT DETECTED Final   Streptococcus species NOT DETECTED NOT DETECTED Final   Streptococcus agalactiae NOT DETECTED NOT DETECTED Final   Streptococcus pneumoniae NOT DETECTED NOT DETECTED Final   Streptococcus pyogenes NOT DETECTED NOT DETECTED Final   A.calcoaceticus-baumannii NOT DETECTED NOT DETECTED Final   Bacteroides fragilis NOT DETECTED NOT DETECTED Final   Enterobacterales DETECTED (A) NOT DETECTED Final    Comment: Enterobacterales represent a large order of gram negative bacteria, not a single organism. CRITICAL RESULT CALLED TO, READ BACK BY AND VERIFIED WITH: C,PIERCE PHARMD @1403  02/11/21 EB    Enterobacter cloacae complex NOT DETECTED NOT DETECTED Final   Escherichia coli DETECTED (A) NOT DETECTED Final    Comment: CRITICAL RESULT CALLED  TO, READ BACK BY AND VERIFIED WITH: C,PIERCE PHARMD @1403  02/11/21 EB    Klebsiella aerogenes NOT DETECTED NOT DETECTED Final   Klebsiella oxytoca NOT DETECTED NOT DETECTED Final   Klebsiella pneumoniae NOT DETECTED NOT DETECTED Final   Proteus species NOT DETECTED NOT DETECTED Final   Salmonella species NOT DETECTED NOT DETECTED Final   Serratia marcescens NOT DETECTED NOT DETECTED Final   Haemophilus influenzae NOT DETECTED NOT DETECTED Final   Neisseria meningitidis NOT DETECTED NOT DETECTED Final   Pseudomonas aeruginosa NOT DETECTED NOT DETECTED Final   Stenotrophomonas maltophilia NOT DETECTED NOT DETECTED Final   Candida albicans NOT DETECTED NOT DETECTED Final   Candida auris NOT DETECTED NOT DETECTED Final   Candida glabrata NOT DETECTED NOT DETECTED Final   Candida krusei NOT DETECTED NOT DETECTED Final   Candida parapsilosis NOT DETECTED NOT DETECTED Final   Candida tropicalis NOT DETECTED NOT DETECTED Final   Cryptococcus neoformans/gattii NOT DETECTED NOT DETECTED Final   CTX-M ESBL NOT DETECTED NOT DETECTED Final   Carbapenem resistance IMP NOT DETECTED NOT DETECTED Final   Carbapenem resistance KPC NOT DETECTED NOT DETECTED Final   Carbapenem resistance NDM NOT DETECTED NOT DETECTED Final   Carbapenem resist OXA 48 LIKE NOT DETECTED NOT DETECTED Final   Carbapenem resistance VIM NOT DETECTED NOT DETECTED Final    Comment: Performed at [facility], [address], [place],  [address]  Urine culture     Status: Abnormal   Collection Time: 02/10/21  9:29 PM   Specimen: Urine, Random  Result Value Ref Range Status   Specimen Description URINE, RANDOM  Final   Special Requests   Final    NONE Performed at [facility], [address] [place], [place] [address]    Culture >=100,000 COLONIES/mL ESCHERICHIA COLI (A)  Final   Report Status 02/13/2021 FINAL  Final   Organism ID, Bacteria ESCHERICHIA COLI (A)  Final      Susceptibility   Escherichia coli  - MIC*    AMPICILLIN 8 SENSITIVE Sensitive     CEFAZOLIN <=4 SENSITIVE Sensitive     CEFEPIME <=0.12 SENSITIVE Sensitive     CEFTRIAXONE <=0.25 SENSITIVE Sensitive     CIPROFLOXACIN <=0.25 SENSITIVE Sensitive     GENTAMICIN <=1 SENSITIVE Sensitive     IMIPENEM <=0.25 SENSITIVE Sensitive     NITROFURANTOIN <=16 SENSITIVE Sensitive     TRIMETH/SULFA <=20 SENSITIVE Sensitive     AMPICILLIN/SULBACTAM 4 SENSITIVE Sensitive     PIP/TAZO <=4 SENSITIVE Sensitive     * >=100,000 COLONIES/mL ESCHERICHIA COLI  Culture, blood (Routine X 2) w Reflex to ID Panel     Status: None (Preliminary result)   Collection Time: 02/12/21 12:21 PM   Specimen: BLOOD LEFT HAND  Result Value Ref Range Status   Specimen Description BLOOD LEFT HAND  Final   Special Requests   Final    BOTTLES DRAWN AEROBIC AND ANAEROBIC Blood Culture adequate volume   Culture   Final    NO GROWTH 4 DAYS Performed at [facility], [address], [place], [place] [address]    Report Status PENDING  Incomplete  Culture, blood (Routine X 2) w Reflex to ID Panel     Status: None (Preliminary result)   Collection Time: 02/12/21 12:22 PM   Specimen: BLOOD RIGHT ARM  Result Value Ref Range Status   Specimen Description BLOOD RIGHT ARM  Final   Special Requests   Final    BOTTLES DRAWN AEROBIC AND ANAEROBIC Blood Culture adequate volume   Culture   Final    NO GROWTH 4 DAYS Performed at [facility], [place] [address], [place], [place] [address]    Report Status PENDING  Incomplete    Radiology Studies: No results found.    Clinton [name]  If 7PM-7AM, please contact night-coverage [email or web address] 02/16/2021, 3:48 PM

## 2021-02-17 DIAGNOSIS — N179 Acute kidney failure, unspecified: Secondary | ICD-10-CM | POA: Diagnosis not present

## 2021-02-17 DIAGNOSIS — R5381 Other malaise: Secondary | ICD-10-CM | POA: Diagnosis not present

## 2021-02-17 DIAGNOSIS — R338 Other retention of urine: Secondary | ICD-10-CM | POA: Diagnosis not present

## 2021-02-17 DIAGNOSIS — N390 Urinary tract infection, site not specified: Secondary | ICD-10-CM | POA: Diagnosis not present

## 2021-02-17 LAB — RENAL FUNCTION PANEL
Albumin: 1.8 g/dL — ABNORMAL LOW (ref 3.5–5.0)
Anion gap: 9 (ref 5–15)
BUN: 14 mg/dL (ref 8–23)
CO2: 25 mmol/L (ref 22–32)
Calcium: 7.6 mg/dL — ABNORMAL LOW (ref 8.9–10.3)
Chloride: 102 mmol/L (ref 98–111)
Creatinine, Ser: 1.16 mg/dL (ref 0.61–1.24)
GFR, Estimated: >60 mL/min (ref >60)
Glucose, Bld: 71 mg/dL (ref 70–99)
Phosphorus: 2.2 mg/dL — ABNORMAL LOW (ref 2.5–4.6)
Potassium: 3.3 mmol/L — ABNORMAL LOW (ref 3.5–5.1)
Sodium: 136 mmol/L (ref 135–145)

## 2021-02-17 LAB — CULTURE, BLOOD (ROUTINE X 2)
Culture: NO GROWTH
Culture: NO GROWTH
Special Requests: ADEQUATE
Special Requests: ADEQUATE

## 2021-02-17 LAB — CBC
HCT: 33.5 % — ABNORMAL LOW (ref 39.0–52.0)
Hemoglobin: 11.6 g/dL — ABNORMAL LOW (ref 13.0–17.0)
MCH: 32.8 pg (ref 26.0–34.0)
MCHC: 34.6 g/dL (ref 30.0–36.0)
MCV: 94.6 fL (ref 80.0–100.0)
Platelets: 175 10*3/uL (ref 150–400)
RBC: 3.54 MIL/uL — ABNORMAL LOW (ref 4.22–5.81)
RDW: 12.8 % (ref 11.5–15.5)
WBC: 8.6 10*3/uL (ref 4.0–10.5)
nRBC: 0 % (ref 0.0–0.2)

## 2021-02-17 LAB — MAGNESIUM: Magnesium: 2 mg/dL (ref 1.7–2.4)

## 2021-02-17 MED ORDER — POTASSIUM CHLORIDE CRYS ER 20 MEQ PO TBCR
40.0000 meq | EXTENDED_RELEASE_TABLET | ORAL | Status: AC
  Administered 2021-02-17 (×2): 40 meq via ORAL
  Filled 2021-02-17 (×2): qty 2

## 2021-02-17 NOTE — Progress Notes (Signed)
Pharmacy Antibiotic Note  Clinton [name] is a 80 y.o. male admitted on 02/10/2021.  Pharmacy has been consulted for Unasyn dosing for aspiration PNA. Patient also with e. Coli bacteremia - pansensitive. CT chest on 3/6 with bibasilar airspace opacities consist with acute infiltrate - possible aspiration. WBC wnl. Afebrile. CrCl 52 ml/min. Scr has been improving but is still on the appropriate dose. Will continue for now.  Plan: Continue Unasyn 3g IV q8h Monitor renal function, length of therapy, and clinical progression  Height: 5\' 10"  (177.8 cm) Weight: 71.7 kg (158 lb) IBW/kg (Calculated) : 73  Temp (24hrs), Avg:98.4 F (36.9 C), Min:98.1 F (36.7 C), Max:98.7 F (37.1 C)  Recent Labs  Lab 02/10/21 1635 02/10/21 2000 02/11/21 0045 02/11/21 9449 02/12/21 0155 02/13/21 0007 02/14/21 0203 02/15/21 0059 02/16/21 0119 02/17/21 0119  WBC  --  8.8 7.7  --  7.4 8.7 6.3  --   --  8.6  CREATININE  --  2.75* 2.58*  --  2.17* 1.61* 1.33* 1.26* 1.19 1.16  LATICACIDVEN 2.9* 3.0*  --  1.6  --   --   --   --   --   --     Estimated Creatinine Clearance: 52.4 mL/min (by C-G formula based on SCr of 1.16 mg/dL).    No Known Allergies  Antimicrobials this admission: CTX x1 3/5 >>3/7 Zosyn x1 3/5 Cefazolin 3/8 >>3/9  Unasyn 3/9 >>  Dose adjustments this admission: N/a  Microbiology results: 3/5 ucx: e. Coli 3/5 bcx: 1/4 e. Coli (pansensitive) 3/7 bcx: ngtd   Thank you for allowing pharmacy to be a part of this patient's care.  Alfonse Spruce, PharmD PGY2 ID Pharmacy Resident Phone between 7 am - 3:30 pm: [phone]  Please check AMION for all [facility] phone numbers After 10:00 PM, call [facility] [phone]   02/17/2021 12:40 PM

## 2021-02-17 NOTE — Progress Notes (Signed)
PROGRESS NOTE  Clinton [name] NWG:[id number] DOB: 1941/10/01   PCP: [name], NP  Patient is from: Home  DOA: 02/10/2021 LOS: 7  Chief complaints: Hematemesis  Brief Narrative / Interim history: 80 year old male with PMH of CAD, HTN, spinal stenosis, osteoarthritis, Gilbert's syndrome, GERD, debility, chronic urinary tension with bilateral hydronephrosis and recent hospitalization in 01/2021 with AKI presenting with coffee-ground emesis, and admitted for AKI, E. coli bacteremia and UTI, as well as possible aspiration pneumonia.  Patient was treated with IV fluid with improvement in his AKI.  He was also treated for E. coli UTI and bacteremia with improvement in his symptoms.  However, he remains debilitated and therapy recommended SNF.   Subjective: Seen and examined earlier this morning.  No major events overnight of this morning.  No complaints.  He wishes to go home. He was angry and said "none of your business" when asked how he is going to take care of himself. He quickly calms down and return to normal tone when asked the purpose of the question, which is to make sure he has safe disposition. He says he did not like [facility]. He is open to a different SNF if "they work with me".   Objective: Vitals:   02/16/21 1259 02/16/21 2054 02/17/21 0500 02/17/21 0512  BP: 108/77 137/76  114/68  Pulse: 92 64  63  Resp: 16 16  16   Temp: 98.7 F (37.1 C) 98.3 F (36.8 C)  98.1 F (36.7 C)  TempSrc: Oral Oral  Oral  SpO2: 98% 98%  98%  Weight:   71.7 kg   Height:        Intake/Output Summary (Last 24 hours) at 02/17/2021 1417 Last data filed at 02/17/2021 0838 Gross per 24 hour  Intake 440 ml  Output 1200 ml  Net -760 ml   Filed Weights   02/13/21 0537 02/14/21 0456 02/17/21 0500  Weight: 70.4 kg 70.2 kg 71.7 kg    Examination:  GENERAL: No apparent distress.  Nontoxic. HEENT: MMM.  Vision and hearing grossly intact.  NECK: Supple.  No apparent JVD.  RESP: On RA.   No IWOB.  Fair aeration bilaterally. CVS:  RRR. Heart sounds normal.  ABD/GI/GU: BS+. Abd soft, NTND.  MSK/EXT:  Moves extremities. No apparent deformity. No edema.  SKIN: no apparent skin lesion or wound NEURO: Awake, alert and oriented to self, place, situation and time but not to date.  No apparent focal neuro deficit other than generalized weakness, more pronounced in lower extremities. PSYCH: Labile mood? Angry but calms down quickly.   Procedures:  None  Microbiology summarized: 3/5-COVID-19 and influenza PCR nonreactive 3/5-blood culture with pansensitive E. Coli 3/5-urine culture with pansensitive E. Coli  Assessment & Plan: Severe sepsis due to E. coli bacteremia and E. coli UTI: POA.  Met criteria with leukocytosis, tachypnea,, AKI, hypotension and lactic acidosis.  Blood & urine cultures with pansensitive E. Coli. -IV Zosyn on 3/5> IV ceftriaxone through 3/8> IV Ancef through 3/9> IV Unasyn>3/13  Dysphagia with possible aspiration pneumonia -Antibiotics broadened to IV Unasyn. -SLP recommended dysphagia 3 diet  Acute kidney injury with azotemia: Likely a combination of prerenal from dehydration and ATN from sepsis and postobstructive uropathy in the setting of urinary retention.  He is also on lisinopril at home.  Resolved. Recent Labs    01/19/21 0316 01/20/21 0436 02/10/21 1437 02/10/21 2000 02/11/21 0045 02/12/21 0155 02/13/21 0007 02/14/21 0203 02/15/21 0059 02/16/21 0119 02/17/21 0119  BUN 26* 18 58*  --  62* 52* 40* 28* 21 17 14   CREATININE 1.35* 1.19 2.71* 2.75* 2.58* 2.17* 1.61* 1.33* 1.26* 1.19 1.16  -Avoid nephrotoxic meds -Continue indwelling Foley catheter on discharge per urology recommendation -Outpatient follow-up with urology in 1 to 2 weeks  Chronic urine retention with bilateral hydronephrosis -Urology recommendation as above  Essential hypertension: Normotensive -Continue holding home amlodipine and lisinopril  Hypokalemia:  K3.3. -Replenish and recheck  Thrombocytopenia: Likely due to sepsis.  Resolved. Recent Labs  Lab 02/10/21 1437 02/10/21 2000 02/11/21 0045 02/12/21 0155 02/13/21 0007 02/14/21 0203 02/17/21 0119  PLT 184 150 146* 109* 112* 119* 175   Debility/functional quadriplegia-generalized weakness, more pronounced in lower extremities -Therapy recommended SNF.  TOC working on this.  Goal of care-significant comorbidity as above. -Appreciate input by palliative-full code with full scope of care  Severe malnutrition Body mass index is 22.67 kg/m. Nutrition Problem: Severe Malnutrition Etiology: acute illness (AKI) Signs/Symptoms: severe muscle depletion,severe fat depletion Interventions: Ensure Enlive (each supplement provides 350kcal and 20 grams of protein),Magic cup,MVI Pressure Injury 01/19/21 Buttocks Posterior Unblanchable Redness bilateral Buttocks (Active)  01/19/21 1324  Location: Buttocks  Location Orientation: Posterior  Staging:   Wound Description (Comments): Unblanchable Redness bilateral Buttocks  Present on Admission: Yes   DVT prophylaxis:  heparin injection 5,000 Units Start: 02/10/21 2200  Code Status: Full code Family Communication: Patient and RN.  No family member at bedside. Level of care: Med-Surg Status is: Inpatient  Remains inpatient appropriate because:Unsafe d/c plan   Dispo: The patient is from: Home              Anticipated d/c is to: SNF              Patient currently is medically stable to d/c.   Difficult to place patient No       Consultants:  Urology-signed off Palliative medicine   Sch Meds:  Scheduled Meds: . Chlorhexidine Gluconate Cloth  6 each Topical Daily  . feeding supplement  237 mL Oral TID BM  . heparin  5,000 Units Subcutaneous Q8H  . multivitamin with minerals  1 tablet Oral Daily  . pantoprazole  40 mg Oral Q0600   Continuous Infusions: . ampicillin-sulbactam (UNASYN) IV 3 g (02/17/21 0858)   PRN  Meds:.acetaminophen **OR** acetaminophen, calcium carbonate (dosed in mg elemental calcium), camphor-menthol **AND** hydrOXYzine, docusate sodium, ondansetron **OR** ondansetron (ZOFRAN) IV, sorbitol, zolpidem  Antimicrobials: Anti-infectives (From admission, onward)   Start     Dose/Rate Route Frequency Ordered Stop   02/14/21 1500  Ampicillin-Sulbactam (UNASYN) 3 g in sodium chloride 0.9 % 100 mL IVPB        3 g 200 mL/hr over 30 Minutes Intravenous Every 8 hours 02/14/21 1413     02/13/21 1500  ceFAZolin (ANCEF) IVPB 2g/100 mL premix  Status:  Discontinued        2 g 200 mL/hr over 30 Minutes Intravenous Every 8 hours 02/13/21 1414 02/14/21 1356   02/11/21 2300  cefTRIAXone (ROCEPHIN) 2 g in sodium chloride 0.9 % 100 mL IVPB  Status:  Discontinued        2 g 200 mL/hr over 30 Minutes Intravenous Every 24 hours 02/11/21 1420 02/13/21 1414   02/10/21 2300  cefTRIAXone (ROCEPHIN) 1 g in sodium chloride 0.9 % 100 mL IVPB  Status:  Discontinued        1 g 200 mL/hr over 30 Minutes Intravenous Every 24 hours 02/10/21 2214 02/11/21 1420   02/10/21 1845  piperacillin-tazobactam (ZOSYN) IVPB 3.375 g  3.375 g 100 mL/hr over 30 Minutes Intravenous  Once 02/10/21 1832 02/10/21 2023       I have personally reviewed the following labs and images: CBC: Recent Labs  Lab 02/11/21 0045 02/12/21 0155 02/13/21 0007 02/14/21 0203 02/17/21 0119  WBC 7.7 7.4 8.7 6.3 8.6  HGB 13.1 11.8* 12.2* 11.1* 11.6*  HCT 37.0* 35.5* 35.7* 32.0* 33.5*  MCV 92.5 95.4 94.7 94.4 94.6  PLT 146* 109* 112* 119* 175   BMP &GFR Recent Labs  Lab 02/13/21 0007 02/14/21 0203 02/15/21 0059 02/16/21 0119 02/17/21 0119  NA 136 135 135 136 136  K 3.0* 3.3* 3.6 3.5 3.3*  CL 95* 94* 96* 101 102  CO2 30 32 31 29 25   GLUCOSE 124* 99 84 74 71  BUN 40* 28* 21 17 14   CREATININE 1.61* 1.33* 1.26* 1.19 1.16  CALCIUM 7.8* 7.4* 7.5* 7.5* 7.6*  MG 2.6* 2.2 2.0 2.0 2.0  PHOS 1.4* 1.8* 2.3* 2.2* 2.2*   Estimated  Creatinine Clearance: 52.4 mL/min (by C-G formula based on SCr of 1.16 mg/dL). Liver & Pancreas: Recent Labs  Lab 02/11/21 0045 02/12/21 0155 02/13/21 0007 02/14/21 0203 02/15/21 0059 02/17/21 0119  AST 16 15 21 21 22   --   ALT 25 17 17 15 11   --   ALKPHOS 72 55 53 46 49  --   BILITOT 1.0 0.8 0.7 0.6 0.9  --   PROT 4.8* 4.7* 4.8* 4.4* 4.7*  --   ALBUMIN 2.1* 1.9* 1.9* 1.7* 1.9* 1.8*   No results for input(s): LIPASE, AMYLASE in the last 168 hours. No results for input(s): AMMONIA in the last 168 hours. Diabetic: No results for input(s): HGBA1C in the last 72 hours. No results for input(s): GLUCAP in the last 168 hours. Cardiac Enzymes: No results for input(s): CKTOTAL, CKMB, CKMBINDEX, TROPONINI in the last 168 hours. No results for input(s): PROBNP in the last 8760 hours. Coagulation Profile: Recent Labs  Lab 02/10/21 1635  INR 1.1   Thyroid Function Tests: No results for input(s): TSH, T4TOTAL, FREET4, T3FREE, THYROIDAB in the last 72 hours. Lipid Profile: No results for input(s): CHOL, HDL, LDLCALC, TRIG, CHOLHDL, LDLDIRECT in the last 72 hours. Anemia Panel: No results for input(s): VITAMINB12, FOLATE, FERRITIN, TIBC, IRON, RETICCTPCT in the last 72 hours. Urine analysis:    Component Value Date/Time   COLORURINE AMBER (A) 02/10/2021 1814   APPEARANCEUR CLOUDY (A) 02/10/2021 1814   LABSPEC 1.010 02/10/2021 1814   PHURINE 6.0 02/10/2021 1814   GLUCOSEU NEGATIVE 02/10/2021 1814   HGBUR MODERATE (A) 02/10/2021 1814   BILIRUBINUR NEGATIVE 02/10/2021 1814   BILIRUBINUR n 10/12/2015 1553   KETONESUR NEGATIVE 02/10/2021 1814   PROTEINUR 100 (A) 02/10/2021 1814   UROBILINOGEN 0.2 10/12/2015 1553   UROBILINOGEN 0.2 11/11/2014 1524   NITRITE NEGATIVE 02/10/2021 1814   LEUKOCYTESUR LARGE (A) 02/10/2021 1814   Sepsis Labs: Invalid input(s): PROCALCITONIN, [place]  Microbiology: Recent Results (from the past 240 hour(s))  Resp Panel by RT-PCR (Flu A&B, Covid)  Nasopharyngeal Swab     Status: None   Collection Time: 02/10/21  4:26 PM   Specimen: Nasopharyngeal Swab; Nasopharyngeal(NP) swabs in vial transport medium  Result Value Ref Range Status   SARS Coronavirus 2 by RT PCR NEGATIVE NEGATIVE Final    Comment: (NOTE) SARS-CoV-2 target nucleic acids are NOT DETECTED.  The SARS-CoV-2 RNA is generally detectable in upper respiratory specimens during the acute phase of infection. The lowest concentration of SARS-CoV-2 viral copies this assay can  detect is 138 copies/mL. A negative result does not preclude SARS-Cov-2 infection and should not be used as the sole basis for treatment or other patient management decisions. A negative result may occur with  improper specimen collection/handling, submission of specimen other than nasopharyngeal swab, presence of viral mutation(s) within the areas targeted by this assay, and inadequate number of viral copies(<138 copies/mL). A negative result must be combined with clinical observations, patient history, and epidemiological information. The expected result is Negative.  Fact Sheet for Patients:  EntrepreneurPulse.com.au  Fact Sheet for Healthcare Providers:  IncredibleEmployment.be  This test is no t yet approved or cleared by the [place] FDA and  has been authorized for detection and/or diagnosis of SARS-CoV-2 by FDA under an Emergency Use Authorization (EUA). This EUA will remain  in effect (meaning this test can be used) for the duration of the COVID-19 declaration under Section 564(b)(1) of the Act, 21 U.S.C.section 360bbb-3(b)(1), unless the authorization is terminated  or revoked sooner.       Influenza A by PCR NEGATIVE NEGATIVE Final   Influenza B by PCR NEGATIVE NEGATIVE Final    Comment: (NOTE) The Xpert Xpress SARS-CoV-2/FLU/RSV plus assay is intended as an aid in the diagnosis of influenza from Nasopharyngeal swab specimens and should not be  used as a sole basis for treatment. Nasal washings and aspirates are unacceptable for Xpert Xpress SARS-CoV-2/FLU/RSV testing.  Fact Sheet for Patients: EntrepreneurPulse.com.au  Fact Sheet for Healthcare Providers: IncredibleEmployment.be  This test is not yet approved or cleared by the [place] FDA and has been authorized for detection and/or diagnosis of SARS-CoV-2 by FDA under an Emergency Use Authorization (EUA). This EUA will remain in effect (meaning this test can be used) for the duration of the COVID-19 declaration under Section 564(b)(1) of the Act, 21 U.S.C. section 360bbb-3(b)(1), unless the authorization is terminated or revoked.  Performed at [facility], [place] [address], [place], [place] [address]   Culture, blood (routine x 2)     Status: None   Collection Time: 02/10/21  4:35 PM   Specimen: BLOOD  Result Value Ref Range Status   Specimen Description BLOOD SITE NOT SPECIFIED  Final   Special Requests   Final    BOTTLES DRAWN AEROBIC AND ANAEROBIC Blood Culture adequate volume   Culture   Final    NO GROWTH 5 DAYS Performed at [facility], [address], [place], [place] [address]    Report Status 02/15/2021 FINAL  Final  Culture, blood (routine x 2)     Status: Abnormal   Collection Time: 02/10/21  4:47 PM   Specimen: BLOOD  Result Value Ref Range Status   Specimen Description BLOOD SITE NOT SPECIFIED  Final   Special Requests   Final    BOTTLES DRAWN AEROBIC AND ANAEROBIC Blood Culture adequate volume   Culture  Setup Time   Final    GRAM NEGATIVE RODS ANAEROBIC BOTTLE ONLY CRITICAL RESULT CALLED TO, READ BACK BY AND VERIFIED WITH: C,PIERCE PHARMD @1403  02/11/21 EB Performed at [facility], [place] [address] [place], [place] [address]    Culture ESCHERICHIA COLI (A)  Final   Report Status 02/14/2021 FINAL  Final   Organism ID, Bacteria ESCHERICHIA COLI  Final      Susceptibility   Escherichia  coli - MIC*    AMPICILLIN 4 SENSITIVE Sensitive     CEFAZOLIN <=4 SENSITIVE Sensitive     CEFEPIME <=0.12 SENSITIVE Sensitive  CEFTAZIDIME <=1 SENSITIVE Sensitive     CEFTRIAXONE <=0.25 SENSITIVE Sensitive     CIPROFLOXACIN <=0.25 SENSITIVE Sensitive     GENTAMICIN <=1 SENSITIVE Sensitive     IMIPENEM <=0.25 SENSITIVE Sensitive     TRIMETH/SULFA <=20 SENSITIVE Sensitive     AMPICILLIN/SULBACTAM 4 SENSITIVE Sensitive     PIP/TAZO <=4 SENSITIVE Sensitive     * ESCHERICHIA COLI  Blood Culture ID Panel (Reflexed)     Status: Abnormal   Collection Time: 02/10/21  4:47 PM  Result Value Ref Range Status   Enterococcus faecalis NOT DETECTED NOT DETECTED Final   Enterococcus Faecium NOT DETECTED NOT DETECTED Final   Listeria monocytogenes NOT DETECTED NOT DETECTED Final   Staphylococcus species NOT DETECTED NOT DETECTED Final   Staphylococcus aureus (BCID) NOT DETECTED NOT DETECTED Final   Staphylococcus epidermidis NOT DETECTED NOT DETECTED Final   Staphylococcus lugdunensis NOT DETECTED NOT DETECTED Final   Streptococcus species NOT DETECTED NOT DETECTED Final   Streptococcus agalactiae NOT DETECTED NOT DETECTED Final   Streptococcus pneumoniae NOT DETECTED NOT DETECTED Final   Streptococcus pyogenes NOT DETECTED NOT DETECTED Final   A.calcoaceticus-baumannii NOT DETECTED NOT DETECTED Final   Bacteroides fragilis NOT DETECTED NOT DETECTED Final   Enterobacterales DETECTED (A) NOT DETECTED Final    Comment: Enterobacterales represent a large order of gram negative bacteria, not a single organism. CRITICAL RESULT CALLED TO, READ BACK BY AND VERIFIED WITH: C,PIERCE PHARMD @1403  02/11/21 EB    Enterobacter cloacae complex NOT DETECTED NOT DETECTED Final   Escherichia coli DETECTED (A) NOT DETECTED Final    Comment: CRITICAL RESULT CALLED TO, READ BACK BY AND VERIFIED WITH: C,PIERCE PHARMD @1403  02/11/21 EB    Klebsiella aerogenes NOT DETECTED NOT DETECTED Final   Klebsiella oxytoca NOT  DETECTED NOT DETECTED Final   Klebsiella pneumoniae NOT DETECTED NOT DETECTED Final   Proteus species NOT DETECTED NOT DETECTED Final   Salmonella species NOT DETECTED NOT DETECTED Final   Serratia marcescens NOT DETECTED NOT DETECTED Final   Haemophilus influenzae NOT DETECTED NOT DETECTED Final   Neisseria meningitidis NOT DETECTED NOT DETECTED Final   Pseudomonas aeruginosa NOT DETECTED NOT DETECTED Final   Stenotrophomonas maltophilia NOT DETECTED NOT DETECTED Final   Candida albicans NOT DETECTED NOT DETECTED Final   Candida auris NOT DETECTED NOT DETECTED Final   Candida glabrata NOT DETECTED NOT DETECTED Final   Candida krusei NOT DETECTED NOT DETECTED Final   Candida parapsilosis NOT DETECTED NOT DETECTED Final   Candida tropicalis NOT DETECTED NOT DETECTED Final   Cryptococcus neoformans/gattii NOT DETECTED NOT DETECTED Final   CTX-M ESBL NOT DETECTED NOT DETECTED Final   Carbapenem resistance IMP NOT DETECTED NOT DETECTED Final   Carbapenem resistance KPC NOT DETECTED NOT DETECTED Final   Carbapenem resistance NDM NOT DETECTED NOT DETECTED Final   Carbapenem resist OXA 48 LIKE NOT DETECTED NOT DETECTED Final   Carbapenem resistance VIM NOT DETECTED NOT DETECTED Final    Comment: Performed at [facility], [address] [place], [place] [address]  Urine culture     Status: Abnormal   Collection Time: 02/10/21  9:29 PM   Specimen: Urine, Random  Result Value Ref Range Status   Specimen Description URINE, RANDOM  Final   Special Requests   Final    NONE Performed at [facility], [place] [address], [place], [place] [address]    Culture >=100,000 COLONIES/mL ESCHERICHIA COLI (A)  Final   Report Status 02/13/2021 FINAL  Final   Organism ID, Bacteria ESCHERICHIA COLI (A)  Final      Susceptibility   Escherichia coli - MIC*    AMPICILLIN 8 SENSITIVE Sensitive     CEFAZOLIN <=4 SENSITIVE Sensitive     CEFEPIME <=0.12 SENSITIVE Sensitive     CEFTRIAXONE <=0.25  SENSITIVE Sensitive     CIPROFLOXACIN <=0.25 SENSITIVE Sensitive     GENTAMICIN <=1 SENSITIVE Sensitive     IMIPENEM <=0.25 SENSITIVE Sensitive     NITROFURANTOIN <=16 SENSITIVE Sensitive     TRIMETH/SULFA <=20 SENSITIVE Sensitive     AMPICILLIN/SULBACTAM 4 SENSITIVE Sensitive     PIP/TAZO <=4 SENSITIVE Sensitive     * >=100,000 COLONIES/mL ESCHERICHIA COLI  Culture, blood (Routine X 2) w Reflex to ID Panel     Status: None (Preliminary result)   Collection Time: 02/12/21 12:21 PM   Specimen: BLOOD LEFT HAND  Result Value Ref Range Status   Specimen Description BLOOD LEFT HAND  Final   Special Requests   Final    BOTTLES DRAWN AEROBIC AND ANAEROBIC Blood Culture adequate volume   Culture   Final    NO GROWTH 4 DAYS Performed at [facility], [address] [place], [place] [address]    Report Status PENDING  Incomplete  Culture, blood (Routine X 2) w Reflex to ID Panel     Status: None (Preliminary result)   Collection Time: 02/12/21 12:22 PM   Specimen: BLOOD RIGHT ARM  Result Value Ref Range Status   Specimen Description BLOOD RIGHT ARM  Final   Special Requests   Final    BOTTLES DRAWN AEROBIC AND ANAEROBIC Blood Culture adequate volume   Culture   Final    NO GROWTH 4 DAYS Performed at [facility], [place] [address] [place], [place] [address]    Report Status PENDING  Incomplete    Radiology Studies: No results found.    Faren Florence T. [place]  If 7PM-7AM, please contact night-coverage [email or web address] 02/17/2021, 2:17 PM

## 2021-02-17 NOTE — TOC Progression Note (Signed)
Transition of Care [facility]) - Progression Note    Patient Details  Name: Clinton [name] MRN: [id number] Date of Birth: [birth year]  Transition of Care [facility]) CM/SW [place], [place] Phone Number: 02/17/2021, 1:08 PM  Clinical Narrative:   CSW contacted [name], patient's son, to discuss bed offers. Per [name], there was an argument that occurred yesterday between him and his dad where his dad verbally revoked him as HCPOA and made his brother, Wille Glaser, his HCPOA. [name] said the attorney was involved and that his dad was allowed to verbally do that. CSW asked about how that was possible given that the patient has not been documented as fully oriented, but [name] said that the lawyer believed he was of sound mind during the argument. [name] indicated that he is not able to make decisions anymore, per his father's wishes as of yesterday afternoon, and that CSW will need to contact his brother, Wille Glaser.  CSW spoke with Joe via phone, who confirmed he was present for the argument yesterday and doesn't understand his dad's anger at his brother. Per Glo Herring has gone above and beyond in caring for their dad, and he doesn't understand why his dad is doing this. Joe is in agreement that the patient needs SNF, as he can't go home because he can't stand up or walk. CSW provided bed offers to Joe, who reviewed them and chose [facility]. CSW explained to Wille Glaser that the patient is likely in his copay days, so he will get billed for that. Joe asked about what would happen if the patient were to go home, and CSW advised that an APS report would be warranted as that is not a safe discharge plan, and discussed what that would include. Joe appreciative of information. Wille Glaser will be going back to [place] tomorrow, but he will be available via phone for needs. CSW to follow.    Expected Discharge Plan: Skilled Nursing Facility Barriers to Discharge: Continued Medical Work up,Insurance  Authorization  Expected Discharge Plan and Services Expected Discharge Plan: [place] arrangements for the past 2 months: Apartment                                       Social Determinants of Health (SDOH) Interventions    Readmission Risk Interventions No flowsheet data found.

## 2021-02-18 DIAGNOSIS — R338 Other retention of urine: Secondary | ICD-10-CM | POA: Diagnosis not present

## 2021-02-18 DIAGNOSIS — R5381 Other malaise: Secondary | ICD-10-CM | POA: Diagnosis not present

## 2021-02-18 DIAGNOSIS — N390 Urinary tract infection, site not specified: Secondary | ICD-10-CM | POA: Diagnosis not present

## 2021-02-18 DIAGNOSIS — N179 Acute kidney failure, unspecified: Secondary | ICD-10-CM | POA: Diagnosis not present

## 2021-02-18 DIAGNOSIS — Z7189 Other specified counseling: Secondary | ICD-10-CM | POA: Diagnosis not present

## 2021-02-18 DIAGNOSIS — Z515 Encounter for palliative care: Secondary | ICD-10-CM | POA: Diagnosis not present

## 2021-02-18 LAB — RENAL FUNCTION PANEL
Albumin: 1.8 g/dL — ABNORMAL LOW (ref 3.5–5.0)
Anion gap: 7 (ref 5–15)
BUN: 12 mg/dL (ref 8–23)
CO2: 23 mmol/L (ref 22–32)
Calcium: 7.8 mg/dL — ABNORMAL LOW (ref 8.9–10.3)
Chloride: 107 mmol/L (ref 98–111)
Creatinine, Ser: 1.17 mg/dL (ref 0.61–1.24)
GFR, Estimated: >60 mL/min (ref >60)
Glucose, Bld: 70 mg/dL (ref 70–99)
Phosphorus: 2.1 mg/dL — ABNORMAL LOW (ref 2.5–4.6)
Potassium: 4.2 mmol/L (ref 3.5–5.1)
Sodium: 137 mmol/L (ref 135–145)

## 2021-02-18 LAB — CBC
HCT: 35 % — ABNORMAL LOW (ref 39.0–52.0)
Hemoglobin: 11.6 g/dL — ABNORMAL LOW (ref 13.0–17.0)
MCH: 31.7 pg (ref 26.0–34.0)
MCHC: 33.1 g/dL (ref 30.0–36.0)
MCV: 95.6 fL (ref 80.0–100.0)
Platelets: 206 10*3/uL (ref 150–400)
RBC: 3.66 MIL/uL — ABNORMAL LOW (ref 4.22–5.81)
RDW: 13 % (ref 11.5–15.5)
WBC: 8.5 10*3/uL (ref 4.0–10.5)
nRBC: 0 % (ref 0.0–0.2)

## 2021-02-18 LAB — MAGNESIUM: Magnesium: 1.9 mg/dL (ref 1.7–2.4)

## 2021-02-18 MED ORDER — MAGNESIUM SULFATE 2 GM/50ML IV SOLN
2.0000 g | Freq: Once | INTRAVENOUS | Status: AC
  Administered 2021-02-18: 2 g via INTRAVENOUS
  Filled 2021-02-18: qty 50

## 2021-02-18 MED ORDER — K PHOS MONO-SOD PHOS DI & MONO 155-852-130 MG PO TABS
250.0000 mg | ORAL_TABLET | Freq: Three times a day (TID) | ORAL | Status: AC
  Administered 2021-02-18 – 2021-02-19 (×3): 250 mg via ORAL
  Filled 2021-02-18 (×3): qty 1

## 2021-02-18 NOTE — Progress Notes (Signed)
PROGRESS NOTE  Clinton [name] MGQ:[id number] DOB: 1941-07-15   PCP: [name], NP  Patient is from: Home  DOA: 02/10/2021 LOS: 8  Chief complaints: Hematemesis  Brief Narrative / Interim history: 80 year old male with PMH of CAD, HTN, spinal stenosis, osteoarthritis, Gilbert's syndrome, GERD, debility, chronic urinary tension with bilateral hydronephrosis and recent hospitalization in 01/2021 with AKI presenting with coffee-ground emesis, and admitted for AKI, E. coli bacteremia and UTI, as well as possible aspiration pneumonia.  Patient was treated with IV fluid with improvement in his AKI.  He was also treated for E. coli UTI and bacteremia with improvement in his symptoms.  However, he remains debilitated and therapy recommended SNF.  TOC working on this but he could be in his copay days since he recently left SNF.   Subjective: Seen and examined earlier this morning.  No major events overnight or this morning.  Reports some knee pain from not getting out of the bed.  He is willing to go to [facility] but curious about the cost.  He says his Social Security income is only $1800/month.   Objective: Vitals:   02/17/21 1526 02/17/21 2025 02/18/21 0500 02/18/21 1415  BP: 116/69 (!) 141/66 124/63 122/65  Pulse: 70 67 66 68  Resp: 16 20 18 18   Temp: (!) 97.5 F (36.4 C) 98.2 F (36.8 C)  97.9 F (36.6 C)  TempSrc: Oral Oral Oral Axillary  SpO2: 100% 99% 97% 99%  Weight:      Height:        Intake/Output Summary (Last 24 hours) at 02/18/2021 1524 Last data filed at 02/18/2021 1200 Gross per 24 hour  Intake 550 ml  Output 750 ml  Net -200 ml   Filed Weights   02/13/21 0537 02/14/21 0456 02/17/21 0500  Weight: 70.4 kg 70.2 kg 71.7 kg    Examination:  GENERAL: No apparent distress.  Nontoxic. HEENT: MMM.  Vision and hearing grossly intact.  NECK: Supple.  No apparent JVD.  RESP: On RA.  No IWOB.  Fair aeration bilaterally. CVS:  RRR. Heart sounds normal.   ABD/GI/GU: BS+. Abd soft, NTND.  Indwelling Foley catheter MSK/EXT:  Moves extremities. No apparent deformity. No edema.  SKIN: no apparent skin lesion or wound NEURO: Awake, alert and oriented appropriately.  No apparent focal neuro deficit other than generalized weakness, more pronounced in LEs. PSYCH: Calm. Normal affect.   Procedures:  None  Microbiology summarized: 3/5-COVID-19 and influenza PCR nonreactive 3/5-blood culture with pansensitive E. Coli 3/5-urine culture with pansensitive E. Coli  Assessment & Plan: Severe sepsis due to E. coli bacteremia and E. coli UTI: POA.  Met criteria with leukocytosis, tachypnea,, AKI, hypotension and lactic acidosis.  Blood & urine cultures with pansensitive E. Coli. -IV Zosyn on 3/5> IV ceftriaxone through 3/8> IV Ancef through 3/9> IV Unasyn>3/13  Dysphagia with possible aspiration pneumonia -Antibiotics as above. -SLP recommended dysphagia 3 diet  AKI with azotemia: Likely a combination of prerenal from dehydration and ATN from sepsis and postobstructive uropathy in the setting of urinary retention.  He is also on lisinopril at home.  Resolved. Recent Labs    01/20/21 0436 02/10/21 1437 02/10/21 2000 02/11/21 0045 02/12/21 0155 02/13/21 0007 02/14/21 0203 02/15/21 0059 02/16/21 0119 02/17/21 0119 02/18/21 0330  BUN 18 58*  --  62* 52* 40* 28* 21 17 14 12   CREATININE 1.19 2.71* 2.75* 2.58* 2.17* 1.61* 1.33* 1.26* 1.19 1.16 1.17  -Avoid nephrotoxic meds -Continue indwelling Foley catheter on discharge per urology  recommendation -Outpatient follow-up with urology in 1 to 2 weeks  Chronic urine retention with bilateral hydronephrosis -Urology recommendation as above  Essential hypertension: Normotensive -Continue holding home amlodipine and lisinopril  Hypokalemia: Resolved  Thrombocytopenia: Likely due to sepsis.  Resolved. Recent Labs  Lab 02/12/21 0155 02/13/21 0007 02/14/21 0203 02/17/21 0119 02/18/21 0330   PLT 109* 112* 119* 175 206   Debility/functional quadriplegia-generalized weakness, more pronounced in lower extremities -Therapy recommended SNF.  TOC working on this but he could be in his copay days since he recently left SNF.  Goal of care-significant comorbidity as above. -Appreciate input by palliative-full code with full scope of care -Patient changed HCPOA from [place] to [facility]  Severe malnutrition Body mass index is 22.67 kg/m. Nutrition Problem: Severe Malnutrition Etiology: acute illness (AKI) Signs/Symptoms: severe muscle depletion,severe fat depletion Interventions: Ensure Enlive (each supplement provides 350kcal and 20 grams of protein),Magic cup,MVI Pressure Injury 01/19/21 Buttocks Posterior Unblanchable Redness bilateral Buttocks (Active)  01/19/21 1324  Location: Buttocks  Location Orientation: Posterior  Staging:   Wound Description (Comments): Unblanchable Redness bilateral Buttocks  Present on Admission: Yes   DVT prophylaxis:  heparin injection 5,000 Units Start: 02/10/21 2200  Code Status: Full code Family Communication: Patient and RN.  No family member at bedside. Level of care: Med-Surg Status is: Inpatient  Remains inpatient appropriate because:Unsafe d/c plan   Dispo: The patient is from: Home              Anticipated d/c is to: SNF              Patient currently is medically stable to d/c.   Difficult to place patient No       Consultants:  Urology-signed off Palliative medicine   Sch Meds:  Scheduled Meds: . Chlorhexidine Gluconate Cloth  6 each Topical Daily  . feeding supplement  237 mL Oral TID BM  . heparin  5,000 Units Subcutaneous Q8H  . multivitamin with minerals  1 tablet Oral Daily  . pantoprazole  40 mg Oral Q0600  . phosphorus  250 mg Oral Q8H   Continuous Infusions:  PRN Meds:.acetaminophen **OR** acetaminophen, calcium carbonate (dosed in mg elemental calcium), camphor-menthol **AND** hydrOXYzine, docusate  sodium, ondansetron **OR** ondansetron (ZOFRAN) IV, sorbitol, zolpidem  Antimicrobials: Anti-infectives (From admission, onward)   Start     Dose/Rate Route Frequency Ordered Stop   02/14/21 1500  Ampicillin-Sulbactam (UNASYN) 3 g in sodium chloride 0.9 % 100 mL IVPB  Status:  Discontinued        3 g 200 mL/hr over 30 Minutes Intravenous Every 8 hours 02/14/21 1413 02/18/21 1522   02/13/21 1500  ceFAZolin (ANCEF) IVPB 2g/100 mL premix  Status:  Discontinued        2 g 200 mL/hr over 30 Minutes Intravenous Every 8 hours 02/13/21 1414 02/14/21 1356   02/11/21 2300  cefTRIAXone (ROCEPHIN) 2 g in sodium chloride 0.9 % 100 mL IVPB  Status:  Discontinued        2 g 200 mL/hr over 30 Minutes Intravenous Every 24 hours 02/11/21 1420 02/13/21 1414   02/10/21 2300  cefTRIAXone (ROCEPHIN) 1 g in sodium chloride 0.9 % 100 mL IVPB  Status:  Discontinued        1 g 200 mL/hr over 30 Minutes Intravenous Every 24 hours 02/10/21 2214 02/11/21 1420   02/10/21 1845  piperacillin-tazobactam (ZOSYN) IVPB 3.375 g        3.375 g 100 mL/hr over 30 Minutes Intravenous  Once 02/10/21 1832  02/10/21 2023       I have personally reviewed the following labs and images: CBC: Recent Labs  Lab 02/12/21 0155 02/13/21 0007 02/14/21 0203 02/17/21 0119 02/18/21 0330  WBC 7.4 8.7 6.3 8.6 8.5  HGB 11.8* 12.2* 11.1* 11.6* 11.6*  HCT 35.5* 35.7* 32.0* 33.5* 35.0*  MCV 95.4 94.7 94.4 94.6 95.6  PLT 109* 112* 119* 175 206   BMP &GFR Recent Labs  Lab 02/14/21 0203 02/15/21 0059 02/16/21 0119 02/17/21 0119 02/18/21 0330  NA 135 135 136 136 137  K 3.3* 3.6 3.5 3.3* 4.2  CL 94* 96* 101 102 107  CO2 32 31 29 25 23   GLUCOSE 99 84 74 71 70  BUN 28* 21 17 14 12   CREATININE 1.33* 1.26* 1.19 1.16 1.17  CALCIUM 7.4* 7.5* 7.5* 7.6* 7.8*  MG 2.2 2.0 2.0 2.0 1.9  PHOS 1.8* 2.3* 2.2* 2.2* 2.1*   Estimated Creatinine Clearance: 51.9 mL/min (by C-G formula based on SCr of 1.17 mg/dL). Liver & Pancreas: Recent Labs   Lab 02/12/21 0155 02/13/21 0007 02/14/21 0203 02/15/21 0059 02/17/21 0119 02/18/21 0330  AST 15 21 21 22   --   --   ALT 17 17 15 11   --   --   ALKPHOS 55 53 46 49  --   --   BILITOT 0.8 0.7 0.6 0.9  --   --   PROT 4.7* 4.8* 4.4* 4.7*  --   --   ALBUMIN 1.9* 1.9* 1.7* 1.9* 1.8* 1.8*   No results for input(s): LIPASE, AMYLASE in the last 168 hours. No results for input(s): AMMONIA in the last 168 hours. Diabetic: No results for input(s): HGBA1C in the last 72 hours. No results for input(s): GLUCAP in the last 168 hours. Cardiac Enzymes: No results for input(s): CKTOTAL, CKMB, CKMBINDEX, TROPONINI in the last 168 hours. No results for input(s): PROBNP in the last 8760 hours. Coagulation Profile: No results for input(s): INR, PROTIME in the last 168 hours. Thyroid Function Tests: No results for input(s): TSH, T4TOTAL, FREET4, T3FREE, THYROIDAB in the last 72 hours. Lipid Profile: No results for input(s): CHOL, HDL, LDLCALC, TRIG, CHOLHDL, LDLDIRECT in the last 72 hours. Anemia Panel: No results for input(s): VITAMINB12, FOLATE, FERRITIN, TIBC, IRON, RETICCTPCT in the last 72 hours. Urine analysis:    Component Value Date/Time   COLORURINE AMBER (A) 02/10/2021 1814   APPEARANCEUR CLOUDY (A) 02/10/2021 1814   LABSPEC 1.010 02/10/2021 1814   PHURINE 6.0 02/10/2021 1814   GLUCOSEU NEGATIVE 02/10/2021 1814   HGBUR MODERATE (A) 02/10/2021 1814   BILIRUBINUR NEGATIVE 02/10/2021 1814   BILIRUBINUR n 10/12/2015 1553   KETONESUR NEGATIVE 02/10/2021 1814   PROTEINUR 100 (A) 02/10/2021 1814   UROBILINOGEN 0.2 10/12/2015 1553   UROBILINOGEN 0.2 11/11/2014 1524   NITRITE NEGATIVE 02/10/2021 1814   LEUKOCYTESUR LARGE (A) 02/10/2021 1814   Sepsis Labs: Invalid input(s): PROCALCITONIN, [place]  Microbiology: Recent Results (from the past 240 hour(s))  Resp Panel by RT-PCR (Flu A&B, Covid) Nasopharyngeal Swab     Status: None   Collection Time: 02/10/21  4:26 PM   Specimen:  Nasopharyngeal Swab; Nasopharyngeal(NP) swabs in vial transport medium  Result Value Ref Range Status   SARS Coronavirus 2 by RT PCR NEGATIVE NEGATIVE Final    Comment: (NOTE) SARS-CoV-2 target nucleic acids are NOT DETECTED.  The SARS-CoV-2 RNA is generally detectable in upper respiratory specimens during the acute phase of infection. The lowest concentration of SARS-CoV-2 viral copies this assay can detect is  138 copies/mL. A negative result does not preclude SARS-Cov-2 infection and should not be used as the sole basis for treatment or other patient management decisions. A negative result may occur with  improper specimen collection/handling, submission of specimen other than nasopharyngeal swab, presence of viral mutation(s) within the areas targeted by this assay, and inadequate number of viral copies(<138 copies/mL). A negative result must be combined with clinical observations, patient history, and epidemiological information. The expected result is Negative.  Fact Sheet for Patients:  EntrepreneurPulse.com.au  Fact Sheet for Healthcare Providers:  IncredibleEmployment.be  This test is no t yet approved or cleared by the [place] FDA and  has been authorized for detection and/or diagnosis of SARS-CoV-2 by FDA under an Emergency Use Authorization (EUA). This EUA will remain  in effect (meaning this test can be used) for the duration of the COVID-19 declaration under Section 564(b)(1) of the Act, 21 U.S.C.section 360bbb-3(b)(1), unless the authorization is terminated  or revoked sooner.       Influenza A by PCR NEGATIVE NEGATIVE Final   Influenza B by PCR NEGATIVE NEGATIVE Final    Comment: (NOTE) The Xpert Xpress SARS-CoV-2/FLU/RSV plus assay is intended as an aid in the diagnosis of influenza from Nasopharyngeal swab specimens and should not be used as a sole basis for treatment. Nasal washings and aspirates are unacceptable for  Xpert Xpress SARS-CoV-2/FLU/RSV testing.  Fact Sheet for Patients: EntrepreneurPulse.com.au  Fact Sheet for Healthcare Providers: IncredibleEmployment.be  This test is not yet approved or cleared by the [place] FDA and has been authorized for detection and/or diagnosis of SARS-CoV-2 by FDA under an Emergency Use Authorization (EUA). This EUA will remain in effect (meaning this test can be used) for the duration of the COVID-19 declaration under Section 564(b)(1) of the Act, 21 U.S.C. section 360bbb-3(b)(1), unless the authorization is terminated or revoked.  Performed at [facility], [place] [address] [place], [place] [address]   Culture, blood (routine x 2)     Status: None   Collection Time: 02/10/21  4:35 PM   Specimen: BLOOD  Result Value Ref Range Status   Specimen Description BLOOD SITE NOT SPECIFIED  Final   Special Requests   Final    BOTTLES DRAWN AEROBIC AND ANAEROBIC Blood Culture adequate volume   Culture   Final    NO GROWTH 5 DAYS Performed at [facility], [address] [place], [place] [address]    Report Status 02/15/2021 FINAL  Final  Culture, blood (routine x 2)     Status: Abnormal   Collection Time: 02/10/21  4:47 PM   Specimen: BLOOD  Result Value Ref Range Status   Specimen Description BLOOD SITE NOT SPECIFIED  Final   Special Requests   Final    BOTTLES DRAWN AEROBIC AND ANAEROBIC Blood Culture adequate volume   Culture  Setup Time   Final    GRAM NEGATIVE RODS ANAEROBIC BOTTLE ONLY CRITICAL RESULT CALLED TO, READ BACK BY AND VERIFIED WITH: C,PIERCE PHARMD @1403  02/11/21 EB Performed at [facility], [place] [address], [place], [place] [address]    Culture ESCHERICHIA COLI (A)  Final   Report Status 02/14/2021 FINAL  Final   Organism ID, Bacteria ESCHERICHIA COLI  Final      Susceptibility   Escherichia coli - MIC*    AMPICILLIN 4 SENSITIVE Sensitive     CEFAZOLIN <=4 SENSITIVE Sensitive      CEFEPIME <=0.12 SENSITIVE Sensitive     CEFTAZIDIME <=1  SENSITIVE Sensitive     CEFTRIAXONE <=0.25 SENSITIVE Sensitive     CIPROFLOXACIN <=0.25 SENSITIVE Sensitive     GENTAMICIN <=1 SENSITIVE Sensitive     IMIPENEM <=0.25 SENSITIVE Sensitive     TRIMETH/SULFA <=20 SENSITIVE Sensitive     AMPICILLIN/SULBACTAM 4 SENSITIVE Sensitive     PIP/TAZO <=4 SENSITIVE Sensitive     * ESCHERICHIA COLI  Blood Culture ID Panel (Reflexed)     Status: Abnormal   Collection Time: 02/10/21  4:47 PM  Result Value Ref Range Status   Enterococcus faecalis NOT DETECTED NOT DETECTED Final   Enterococcus Faecium NOT DETECTED NOT DETECTED Final   Listeria monocytogenes NOT DETECTED NOT DETECTED Final   Staphylococcus species NOT DETECTED NOT DETECTED Final   Staphylococcus aureus (BCID) NOT DETECTED NOT DETECTED Final   Staphylococcus epidermidis NOT DETECTED NOT DETECTED Final   Staphylococcus lugdunensis NOT DETECTED NOT DETECTED Final   Streptococcus species NOT DETECTED NOT DETECTED Final   Streptococcus agalactiae NOT DETECTED NOT DETECTED Final   Streptococcus pneumoniae NOT DETECTED NOT DETECTED Final   Streptococcus pyogenes NOT DETECTED NOT DETECTED Final   A.calcoaceticus-baumannii NOT DETECTED NOT DETECTED Final   Bacteroides fragilis NOT DETECTED NOT DETECTED Final   Enterobacterales DETECTED (A) NOT DETECTED Final    Comment: Enterobacterales represent a large order of gram negative bacteria, not a single organism. CRITICAL RESULT CALLED TO, READ BACK BY AND VERIFIED WITH: C,PIERCE PHARMD @1403  02/11/21 EB    Enterobacter cloacae complex NOT DETECTED NOT DETECTED Final   Escherichia coli DETECTED (A) NOT DETECTED Final    Comment: CRITICAL RESULT CALLED TO, READ BACK BY AND VERIFIED WITH: C,PIERCE PHARMD @1403  02/11/21 EB    Klebsiella aerogenes NOT DETECTED NOT DETECTED Final   Klebsiella oxytoca NOT DETECTED NOT DETECTED Final   Klebsiella pneumoniae NOT DETECTED NOT DETECTED Final    Proteus species NOT DETECTED NOT DETECTED Final   Salmonella species NOT DETECTED NOT DETECTED Final   Serratia marcescens NOT DETECTED NOT DETECTED Final   Haemophilus influenzae NOT DETECTED NOT DETECTED Final   Neisseria meningitidis NOT DETECTED NOT DETECTED Final   Pseudomonas aeruginosa NOT DETECTED NOT DETECTED Final   Stenotrophomonas maltophilia NOT DETECTED NOT DETECTED Final   Candida albicans NOT DETECTED NOT DETECTED Final   Candida auris NOT DETECTED NOT DETECTED Final   Candida glabrata NOT DETECTED NOT DETECTED Final   Candida krusei NOT DETECTED NOT DETECTED Final   Candida parapsilosis NOT DETECTED NOT DETECTED Final   Candida tropicalis NOT DETECTED NOT DETECTED Final   Cryptococcus neoformans/gattii NOT DETECTED NOT DETECTED Final   CTX-M ESBL NOT DETECTED NOT DETECTED Final   Carbapenem resistance IMP NOT DETECTED NOT DETECTED Final   Carbapenem resistance KPC NOT DETECTED NOT DETECTED Final   Carbapenem resistance NDM NOT DETECTED NOT DETECTED Final   Carbapenem resist OXA 48 LIKE NOT DETECTED NOT DETECTED Final   Carbapenem resistance VIM NOT DETECTED NOT DETECTED Final    Comment: Performed at [facility], [address] [place], [place] [address]  Urine culture     Status: Abnormal   Collection Time: 02/10/21  9:29 PM   Specimen: Urine, Random  Result Value Ref Range Status   Specimen Description URINE, RANDOM  Final   Special Requests   Final    NONE Performed at [facility], [place] [address] [place], [place] [address]    Culture >=100,000 COLONIES/mL ESCHERICHIA COLI (A)  Final   Report Status 02/13/2021 FINAL  Final  Organism ID, Bacteria ESCHERICHIA COLI (A)  Final      Susceptibility   Escherichia coli - MIC*    AMPICILLIN 8 SENSITIVE Sensitive     CEFAZOLIN <=4 SENSITIVE Sensitive     CEFEPIME <=0.12 SENSITIVE Sensitive     CEFTRIAXONE <=0.25 SENSITIVE Sensitive     CIPROFLOXACIN <=0.25 SENSITIVE Sensitive     GENTAMICIN <=1  SENSITIVE Sensitive     IMIPENEM <=0.25 SENSITIVE Sensitive     NITROFURANTOIN <=16 SENSITIVE Sensitive     TRIMETH/SULFA <=20 SENSITIVE Sensitive     AMPICILLIN/SULBACTAM 4 SENSITIVE Sensitive     PIP/TAZO <=4 SENSITIVE Sensitive     * >=100,000 COLONIES/mL ESCHERICHIA COLI  Culture, blood (Routine X 2) w Reflex to ID Panel     Status: None   Collection Time: 02/12/21 12:21 PM   Specimen: BLOOD LEFT HAND  Result Value Ref Range Status   Specimen Description BLOOD LEFT HAND  Final   Special Requests   Final    BOTTLES DRAWN AEROBIC AND ANAEROBIC Blood Culture adequate volume   Culture   Final    NO GROWTH 5 DAYS Performed at [facility], [address], [place], [place] [address]    Report Status 02/17/2021 FINAL  Final  Culture, blood (Routine X 2) w Reflex to ID Panel     Status: None   Collection Time: 02/12/21 12:22 PM   Specimen: BLOOD RIGHT ARM  Result Value Ref Range Status   Specimen Description BLOOD RIGHT ARM  Final   Special Requests   Final    BOTTLES DRAWN AEROBIC AND ANAEROBIC Blood Culture adequate volume   Culture   Final    NO GROWTH 5 DAYS Performed at [facility], [place] [address] [place], [place] [address]    Report Status 02/17/2021 FINAL  Final    Radiology Studies: No results found.    Kaidynce Pfister T. [place]  If 7PM-7AM, please contact night-coverage [email or web address] 02/18/2021, 3:24 PM

## 2021-02-18 NOTE — Progress Notes (Addendum)
Palliative Medicine Inpatient Follow Up Note  Reason for consult:  Goals of Care  HPI:  Per intake H&P --> 80 y.o.malewith medical history significant ofcoronary artery disease: GERD, Gilbert's syndrome, hypertension, spinal stenosis, osteoarthritis, who was recently admitted in February with AKI. Patient is back again with complaint of vomiting blood. He had nausea vomiting initially cleared but according to patient the last oneappears to be coffee-ground.  Palliative care was asked to get involved to address goals of care.  Today's Discussion (02/18/2021):  *Please note that this is a verbal dictation therefore any spelling or grammatical errors are due to the "[place] [facility]" system interpretation.  Chart reviewed.   I met with Clinton [name] at bedside this morning. He had not remembered meeting me on two prior occassions nor recalled my name. I was able to reiterate who I am and share with him the ideals of Palliative Care.   Clinton [name] and I talked about the reality that he will now need placement at a facility. He is at this point in agreement with this as he again, realizes he is unable to care fo rhis complex medical needs at home.   [name] goes on to lament that his relationship with his son and daughter in law is poor. He states that he has been kept from his grandchildren and would never understand why. [name] expresses that he worked hard to put his son through school and cannot comprehend why he will not care for him now. I took the time to listen to him and offered therapeutic silence as a form of emotional support.   [name] expresses that his goals are to write memoir. He would also like to live another eight year but realizes that this will likely not happen. We reviewed that he is already exceeding many odds by having lived [age] years. He does acknowledge this as truth.   We complete a review of how [name] was raised and discussed political and social topics such as  racism and discrimination. He states that his mother was a wonderful person who worked very hard to provide him a middle class upbringing. He expresses life regrets. I allowed him he time to express these complex emotions.   We concluded by Clinton [name] being in a place where he accepts that he will need a safer long term living situation.  Questions and concerns addressed   Objective Assessment: Vital Signs Vitals:   02/17/21 2025 02/18/21 0500  BP: (!) 141/66 124/63  Pulse: 67 66  Resp: 20 18  Temp: 98.2 F (36.8 C)   SpO2: 99% 97%    Intake/Output Summary (Last 24 hours) at 02/18/2021 0800 Last data filed at 02/18/2021 0503 Gross per 24 hour  Intake 220 ml  Output 1150 ml  Net -930 ml   Last Weight  Most recent update: 02/17/2021  6:14 AM   Weight  71.7 kg (158 lb)           Gen:  Elderly caucasian M with flat affect HEENT: dry mucous membranes CV: Regular rate and rhythm  PULM: ON RA, clear to auscultation bilaterally  ABD: soft/nontender  EXT: (+) muscle wasting, (+) pedal edema Neuro: Alert and oriented x3   SUMMARY OF RECOMMENDATIONS Full Code  Patient has HCPOA and Living will on file - Pls review desire for natural death if he declines further  MSW to aid son in identifying what to fill out for medicaid  Patient now accepts that he will need to go to nursing home  OP Palliative support will be needed on discharge  Ongoing incremental PMT support  Time Spent: 35 Greater than 50% of the time was spent in counseling and coordination of care ______________________________________________________________________________________ [place] Team Team Cell Phone: [phone] Please utilize secure chat with additional questions, if there is no response within 30 minutes please call the above phone number  Palliative Medicine Team providers are available by phone from 7am to 7pm daily and can be reached through the team  cell phone.  Should this patient require assistance outside of these hours, please call the patient's attending physician.

## 2021-02-19 DIAGNOSIS — N179 Acute kidney failure, unspecified: Secondary | ICD-10-CM | POA: Diagnosis not present

## 2021-02-19 DIAGNOSIS — I10 Essential (primary) hypertension: Secondary | ICD-10-CM | POA: Diagnosis not present

## 2021-02-19 DIAGNOSIS — N390 Urinary tract infection, site not specified: Secondary | ICD-10-CM | POA: Diagnosis not present

## 2021-02-19 DIAGNOSIS — E43 Unspecified severe protein-calorie malnutrition: Secondary | ICD-10-CM | POA: Diagnosis not present

## 2021-02-19 LAB — SARS CORONAVIRUS 2 (TAT 6-24 HRS): SARS Coronavirus 2: NEGATIVE

## 2021-02-19 NOTE — Progress Notes (Signed)
PROGRESS NOTE    Clinton [name]   OTL:[id number]  DOB: 11-Jul-1941  PCP: [name], NP    DOA: 02/10/2021 LOS: 65   Brief Narrative   80 year old male with PMH of CAD, HTN, spinal stenosis, osteoarthritis, Gilbert's syndrome, GERD, debility, chronic urinary tension with bilateral hydronephrosis and recent hospitalization in 01/2021 with AKI presenting with coffee-ground emesis, and admitted for AKI, E. coli bacteremia and UTI, as well as possible aspiration pneumonia.  Patient was treated with IV fluid with improvement in his AKI.  He was also treated for E. coli UTI and bacteremia with improvement in his symptoms.  However, he remains debilitated and therapy recommended SNF.  TOC working on this but he could be in his copay days since he recently left SNF.   Assessment & Plan   Principal Problem:   AKI (acute kidney injury) ([place]) Active Problems:   CAD (coronary artery disease)   HTN (hypertension)   Hyperlipidemia   GERD (gastroesophageal reflux disease)   Acute lower UTI   Hematemesis   Bladder mass   Protein-calorie malnutrition, severe   Acute kidney injury POA, improving.  Likely due to postobstructive uropathy in setting of urinary retention.  Also on lisinopril at home. --Urology consulted --Foley placed for decompression, renal function improving --Monitor renal function  Severe Sepsis due to E. coli UTI and secondary bacteremia Met criteria with leukocytosis, tachypnea,, AKI, hypotension and lactic acidosis.  Blood & urine cultures with pansensitive E. Coli. -IV Zosyn on 3/5> IV ceftriaxone through 3/8> IV Ancef through 3/9> IV Unasyn>3/13  Dysphagia ? Aspiration pneumonia -will broaden Ancef to Unasyn.  Suspect aspiration during emesis earlier this admission. Evaluated by SLP on 3/8, dysphagia 3 diet recommended, thin liquids, home meds with pure.  Aspiration cautions.\  Thrombocytopenia -suspected either sepsis or possibly antibiotic.  Monitor CBC.  Is on  heparin for VTE prophylaxis, okay to continue for now.   Essential hypertension -normotensive soft BPs while antihypertensives are held.  Continue holding for now to avoid hypotension.  Chronic urinary retention with bilateral hydronephrosis -Case was discussed with urology.  CT abdomen pelvis without any masses seen.  Dr. Alinda Money placed Foley catheter for decompression.  Patient to discharge with Foley and have outpatient urology follow-up.  Thrombocytopenia - Likely due to sepsis.  Resolved.  Debility/functional quadriplegia-generalized weakness, more pronounced in lower extremities -Therapy recommended SNF.  TOC working on this but he could be in his copay days since he recently left SNF.  Goal of care - significant comorbidity as above. -Palliative care consulted -Patient changed HCPOA from [name] to [facility]  Severe malnutrition Body mass index is 22.67 kg/m. Nutrition Problem: Severe Malnutrition Etiology: acute illness (AKI) Signs/Symptoms: severe muscle depletion,severe fat depletion Interventions: Ensure Enlive (each supplement provides 350kcal and 20 grams of protein),Magic cup,MVI Pressure Injury 01/19/21 Buttocks Posterior Unblanchable Redness bilateral Buttocks (Active)  01/19/21 1324  Location: Buttocks  Location Orientation: Posterior  Staging:   Wound Description (Comments): Unblanchable Redness bilateral Buttocks  Present on Admission: Yes     DVT prophylaxis: heparin injection 5,000 Units Start: 02/10/21 2200   Diet:  Diet Orders (From admission, onward)    Start     Ordered   02/11/21 0759  DIET DYS 3 Room service appropriate? Yes; Fluid consistency: Thin  Diet effective now       Comments: Heart healthy  Question Answer Comment  Room service appropriate? Yes   Fluid consistency: Thin      02/11/21 0758  Code Status: Full Code    Subjective 02/19/21    Patient sleeping but woke easily when seen today.  Denies pain, fever, chills, n/v,  sob, cp or any other acute complaints.     Disposition Plan & Communication   Status is: Inpatient  Remains inpatient appropriate because: Still placement pending, continue monitoring and replacing electrolytes.   Dispo: The patient is from: Home              Anticipated d/c is to: SNF              Patient currently is not medically stable to d/c.   Difficult to place patient yes  Family Communication: None at bedside, will attempt to call   Consults, Procedures, Significant Events   Consultants:   Urology  Procedures:   Foley catheter placed  Antimicrobials:  Anti-infectives (From admission, onward)   Start     Dose/Rate Route Frequency Ordered Stop   02/14/21 1500  Ampicillin-Sulbactam (UNASYN) 3 g in sodium chloride 0.9 % 100 mL IVPB  Status:  Discontinued        3 g 200 mL/hr over 30 Minutes Intravenous Every 8 hours 02/14/21 1413 02/18/21 1522   02/13/21 1500  ceFAZolin (ANCEF) IVPB 2g/100 mL premix  Status:  Discontinued        2 g 200 mL/hr over 30 Minutes Intravenous Every 8 hours 02/13/21 1414 02/14/21 1356   02/11/21 2300  cefTRIAXone (ROCEPHIN) 2 g in sodium chloride 0.9 % 100 mL IVPB  Status:  Discontinued        2 g 200 mL/hr over 30 Minutes Intravenous Every 24 hours 02/11/21 1420 02/13/21 1414   02/10/21 2300  cefTRIAXone (ROCEPHIN) 1 g in sodium chloride 0.9 % 100 mL IVPB  Status:  Discontinued        1 g 200 mL/hr over 30 Minutes Intravenous Every 24 hours 02/10/21 2214 02/11/21 1420   02/10/21 1845  piperacillin-tazobactam (ZOSYN) IVPB 3.375 g        3.375 g 100 mL/hr over 30 Minutes Intravenous  Once 02/10/21 1832 02/10/21 2023        Micro    Objective   Vitals:   02/18/21 1415 02/18/21 2220 02/19/21 0532 02/19/21 1425  BP: 122/65 (!) 138/93 (!) 153/81 120/68  Pulse: 68 79 83 73  Resp: 18 20 (!) 22 17  Temp: 97.9 F (36.6 C) 98 F (36.7 C) 98.3 F (36.8 C) 98.6 F (37 C)  TempSrc: Axillary Oral Oral   SpO2: 99% 99% 99% 98%   Weight:      Height:        Intake/Output Summary (Last 24 hours) at 02/19/2021 1841 Last data filed at 02/19/2021 1759 Gross per 24 hour  Intake 390 ml  Output 1625 ml  Net -1235 ml   Filed Weights   02/13/21 0537 02/14/21 0456 02/17/21 0500  Weight: 70.4 kg 70.2 kg 71.7 kg    Physical Exam:  General exam: Awake alert, no acute distress Respiratory system: CTAB, no wheezes, rales or rhonchi, normal respiratory effort. Cardiovascular system: normal S1/S2, RRR, no pedal edema, 2+radial pulses Gastrointestinal system: soft, NT, ND, +bowel sounds. Genitourinary: Foley catheter in place   Labs   Data Reviewed: I have personally reviewed following labs and imaging studies  CBC: Recent Labs  Lab 02/13/21 0007 02/14/21 0203 02/17/21 0119 02/18/21 0330  WBC 8.7 6.3 8.6 8.5  HGB 12.2* 11.1* 11.6* 11.6*  HCT 35.7* 32.0* 33.5* 35.0*  MCV 94.7 94.4 94.6  95.6  PLT 112* 119* 175 209   Basic Metabolic Panel: Recent Labs  Lab 02/14/21 0203 02/15/21 0059 02/16/21 0119 02/17/21 0119 02/18/21 0330  NA 135 135 136 136 137  K 3.3* 3.6 3.5 3.3* 4.2  CL 94* 96* 101 102 107  CO2 32 31 29 25 23   GLUCOSE 99 84 74 71 70  BUN 28* 21 17 14 12   CREATININE 1.33* 1.26* 1.19 1.16 1.17  CALCIUM 7.4* 7.5* 7.5* 7.6* 7.8*  MG 2.2 2.0 2.0 2.0 1.9  PHOS 1.8* 2.3* 2.2* 2.2* 2.1*   GFR: Estimated Creatinine Clearance: 51.9 mL/min (by C-G formula based on SCr of 1.17 mg/dL). Liver Function Tests: Recent Labs  Lab 02/13/21 0007 02/14/21 0203 02/15/21 0059 02/17/21 0119 02/18/21 0330  AST 21 21 22   --   --   ALT 17 15 11   --   --   ALKPHOS 53 46 49  --   --   BILITOT 0.7 0.6 0.9  --   --   PROT 4.8* 4.4* 4.7*  --   --   ALBUMIN 1.9* 1.7* 1.9* 1.8* 1.8*   No results for input(s): LIPASE, AMYLASE in the last 168 hours. No results for input(s): AMMONIA in the last 168 hours. Coagulation Profile: No results for input(s): INR, PROTIME in the last 168 hours. Cardiac Enzymes: No  results for input(s): CKTOTAL, CKMB, CKMBINDEX, TROPONINI in the last 168 hours. BNP (last 3 results) No results for input(s): PROBNP in the last 8760 hours. HbA1C: No results for input(s): HGBA1C in the last 72 hours. CBG: No results for input(s): GLUCAP in the last 168 hours. Lipid Profile: No results for input(s): CHOL, HDL, LDLCALC, TRIG, CHOLHDL, LDLDIRECT in the last 72 hours. Thyroid Function Tests: No results for input(s): TSH, T4TOTAL, FREET4, T3FREE, THYROIDAB in the last 72 hours. Anemia Panel: No results for input(s): VITAMINB12, FOLATE, FERRITIN, TIBC, IRON, RETICCTPCT in the last 72 hours. Sepsis Labs: No results for input(s): PROCALCITON, LATICACIDVEN in the last 168 hours.  Recent Results (from the past 240 hour(s))  Resp Panel by RT-PCR (Flu A&B, Covid) Nasopharyngeal Swab     Status: None   Collection Time: 02/10/21  4:26 PM   Specimen: Nasopharyngeal Swab; Nasopharyngeal(NP) swabs in vial transport medium  Result Value Ref Range Status   SARS Coronavirus 2 by RT PCR NEGATIVE NEGATIVE Final    Comment: (NOTE) SARS-CoV-2 target nucleic acids are NOT DETECTED.  The SARS-CoV-2 RNA is generally detectable in upper respiratory specimens during the acute phase of infection. The lowest concentration of SARS-CoV-2 viral copies this assay can detect is 138 copies/mL. A negative result does not preclude SARS-Cov-2 infection and should not be used as the sole basis for treatment or other patient management decisions. A negative result may occur with  improper specimen collection/handling, submission of specimen other than nasopharyngeal swab, presence of viral mutation(s) within the areas targeted by this assay, and inadequate number of viral copies(<138 copies/mL). A negative result must be combined with clinical observations, patient history, and epidemiological information. The expected result is Negative.  Fact Sheet for Patients:   EntrepreneurPulse.com.au  Fact Sheet for Healthcare Providers:  IncredibleEmployment.be  This test is no t yet approved or cleared by the [place] FDA and  has been authorized for detection and/or diagnosis of SARS-CoV-2 by FDA under an Emergency Use Authorization (EUA). This EUA will remain  in effect (meaning this test can be used) for the duration of the COVID-19 declaration under Section 564(b)(1) of the  Act, 21 U.S.C.section 360bbb-3(b)(1), unless the authorization is terminated  or revoked sooner.       Influenza A by PCR NEGATIVE NEGATIVE Final   Influenza B by PCR NEGATIVE NEGATIVE Final    Comment: (NOTE) The Xpert Xpress SARS-CoV-2/FLU/RSV plus assay is intended as an aid in the diagnosis of influenza from Nasopharyngeal swab specimens and should not be used as a sole basis for treatment. Nasal washings and aspirates are unacceptable for Xpert Xpress SARS-CoV-2/FLU/RSV testing.  Fact Sheet for Patients: EntrepreneurPulse.com.au  Fact Sheet for Healthcare Providers: IncredibleEmployment.be  This test is not yet approved or cleared by the [place] FDA and has been authorized for detection and/or diagnosis of SARS-CoV-2 by FDA under an Emergency Use Authorization (EUA). This EUA will remain in effect (meaning this test can be used) for the duration of the COVID-19 declaration under Section 564(b)(1) of the Act, 21 U.S.C. section 360bbb-3(b)(1), unless the authorization is terminated or revoked.  Performed at [facility], [place] [address], [place], [place] [address]   Culture, blood (routine x 2)     Status: None   Collection Time: 02/10/21  4:35 PM   Specimen: BLOOD  Result Value Ref Range Status   Specimen Description BLOOD SITE NOT SPECIFIED  Final   Special Requests   Final    BOTTLES DRAWN AEROBIC AND ANAEROBIC Blood Culture adequate volume   Culture   Final    NO GROWTH  5 DAYS Performed at [facility], [address], [place], [place] [address]    Report Status 02/15/2021 FINAL  Final  Culture, blood (routine x 2)     Status: Abnormal   Collection Time: 02/10/21  4:47 PM   Specimen: BLOOD  Result Value Ref Range Status   Specimen Description BLOOD SITE NOT SPECIFIED  Final   Special Requests   Final    BOTTLES DRAWN AEROBIC AND ANAEROBIC Blood Culture adequate volume   Culture  Setup Time   Final    GRAM NEGATIVE RODS ANAEROBIC BOTTLE ONLY CRITICAL RESULT CALLED TO, READ BACK BY AND VERIFIED WITH: C,PIERCE PHARMD @1403  02/11/21 EB Performed at [facility], [place] [address], [place], [place] [address]    Culture ESCHERICHIA COLI (A)  Final   Report Status 02/14/2021 FINAL  Final   Organism ID, Bacteria ESCHERICHIA COLI  Final      Susceptibility   Escherichia coli - MIC*    AMPICILLIN 4 SENSITIVE Sensitive     CEFAZOLIN <=4 SENSITIVE Sensitive     CEFEPIME <=0.12 SENSITIVE Sensitive     CEFTAZIDIME <=1 SENSITIVE Sensitive     CEFTRIAXONE <=0.25 SENSITIVE Sensitive     CIPROFLOXACIN <=0.25 SENSITIVE Sensitive     GENTAMICIN <=1 SENSITIVE Sensitive     IMIPENEM <=0.25 SENSITIVE Sensitive     TRIMETH/SULFA <=20 SENSITIVE Sensitive     AMPICILLIN/SULBACTAM 4 SENSITIVE Sensitive     PIP/TAZO <=4 SENSITIVE Sensitive     * ESCHERICHIA COLI  Blood Culture ID Panel (Reflexed)     Status: Abnormal   Collection Time: 02/10/21  4:47 PM  Result Value Ref Range Status   Enterococcus faecalis NOT DETECTED NOT DETECTED Final   Enterococcus Faecium NOT DETECTED NOT DETECTED Final   Listeria monocytogenes NOT DETECTED NOT DETECTED Final   Staphylococcus species NOT DETECTED NOT DETECTED Final   Staphylococcus aureus (BCID) NOT DETECTED NOT DETECTED Final   Staphylococcus epidermidis NOT DETECTED NOT DETECTED Final   Staphylococcus lugdunensis NOT DETECTED NOT DETECTED Final  Streptococcus species NOT DETECTED NOT DETECTED Final   Streptococcus  agalactiae NOT DETECTED NOT DETECTED Final   Streptococcus pneumoniae NOT DETECTED NOT DETECTED Final   Streptococcus pyogenes NOT DETECTED NOT DETECTED Final   A.calcoaceticus-baumannii NOT DETECTED NOT DETECTED Final   Bacteroides fragilis NOT DETECTED NOT DETECTED Final   Enterobacterales DETECTED (A) NOT DETECTED Final    Comment: Enterobacterales represent a large order of gram negative bacteria, not a single organism. CRITICAL RESULT CALLED TO, READ BACK BY AND VERIFIED WITH: C,PIERCE PHARMD @1403  02/11/21 EB    Enterobacter cloacae complex NOT DETECTED NOT DETECTED Final   Escherichia coli DETECTED (A) NOT DETECTED Final    Comment: CRITICAL RESULT CALLED TO, READ BACK BY AND VERIFIED WITH: C,PIERCE PHARMD @1403  02/11/21 EB    Klebsiella aerogenes NOT DETECTED NOT DETECTED Final   Klebsiella oxytoca NOT DETECTED NOT DETECTED Final   Klebsiella pneumoniae NOT DETECTED NOT DETECTED Final   Proteus species NOT DETECTED NOT DETECTED Final   Salmonella species NOT DETECTED NOT DETECTED Final   Serratia marcescens NOT DETECTED NOT DETECTED Final   Haemophilus influenzae NOT DETECTED NOT DETECTED Final   Neisseria meningitidis NOT DETECTED NOT DETECTED Final   Pseudomonas aeruginosa NOT DETECTED NOT DETECTED Final   Stenotrophomonas maltophilia NOT DETECTED NOT DETECTED Final   Candida albicans NOT DETECTED NOT DETECTED Final   Candida auris NOT DETECTED NOT DETECTED Final   Candida glabrata NOT DETECTED NOT DETECTED Final   Candida krusei NOT DETECTED NOT DETECTED Final   Candida parapsilosis NOT DETECTED NOT DETECTED Final   Candida tropicalis NOT DETECTED NOT DETECTED Final   Cryptococcus neoformans/gattii NOT DETECTED NOT DETECTED Final   CTX-M ESBL NOT DETECTED NOT DETECTED Final   Carbapenem resistance IMP NOT DETECTED NOT DETECTED Final   Carbapenem resistance KPC NOT DETECTED NOT DETECTED Final   Carbapenem resistance NDM NOT DETECTED NOT DETECTED Final   Carbapenem resist OXA  48 LIKE NOT DETECTED NOT DETECTED Final   Carbapenem resistance VIM NOT DETECTED NOT DETECTED Final    Comment: Performed at [facility], [address] [place], [place] [address]  Urine culture     Status: Abnormal   Collection Time: 02/10/21  9:29 PM   Specimen: Urine, Random  Result Value Ref Range Status   Specimen Description URINE, RANDOM  Final   Special Requests   Final    NONE Performed at [facility], [place] [address], [place], [place] [address]    Culture >=100,000 COLONIES/mL ESCHERICHIA COLI (A)  Final   Report Status 02/13/2021 FINAL  Final   Organism ID, Bacteria ESCHERICHIA COLI (A)  Final      Susceptibility   Escherichia coli - MIC*    AMPICILLIN 8 SENSITIVE Sensitive     CEFAZOLIN <=4 SENSITIVE Sensitive     CEFEPIME <=0.12 SENSITIVE Sensitive     CEFTRIAXONE <=0.25 SENSITIVE Sensitive     CIPROFLOXACIN <=0.25 SENSITIVE Sensitive     GENTAMICIN <=1 SENSITIVE Sensitive     IMIPENEM <=0.25 SENSITIVE Sensitive     NITROFURANTOIN <=16 SENSITIVE Sensitive     TRIMETH/SULFA <=20 SENSITIVE Sensitive     AMPICILLIN/SULBACTAM 4 SENSITIVE Sensitive     PIP/TAZO <=4 SENSITIVE Sensitive     * >=100,000 COLONIES/mL ESCHERICHIA COLI  Culture, blood (Routine X 2) w Reflex to ID Panel     Status: None   Collection Time: 02/12/21 12:21 PM   Specimen: BLOOD LEFT HAND  Result Value Ref Range Status   Specimen  Description BLOOD LEFT HAND  Final   Special Requests   Final    BOTTLES DRAWN AEROBIC AND ANAEROBIC Blood Culture adequate volume   Culture   Final    NO GROWTH 5 DAYS Performed at [facility], [address] [place], [place] [address]    Report Status 02/17/2021 FINAL  Final  Culture, blood (Routine X 2) w Reflex to ID Panel     Status: None   Collection Time: 02/12/21 12:22 PM   Specimen: BLOOD RIGHT ARM  Result Value Ref Range Status   Specimen Description BLOOD RIGHT ARM  Final   Special Requests   Final    BOTTLES DRAWN AEROBIC AND  ANAEROBIC Blood Culture adequate volume   Culture   Final    NO GROWTH 5 DAYS Performed at [facility], [place] [address], [place], [place] [address]    Report Status 02/17/2021 FINAL  Final  SARS CORONAVIRUS 2 (TAT 6-24 HRS) Nasopharyngeal Nasopharyngeal Swab     Status: None   Collection Time: 02/19/21 11:35 AM   Specimen: Nasopharyngeal Swab  Result Value Ref Range Status   SARS Coronavirus 2 NEGATIVE NEGATIVE Final    Comment: (NOTE) SARS-CoV-2 target nucleic acids are NOT DETECTED.  The SARS-CoV-2 RNA is generally detectable in upper and lower respiratory specimens during the acute phase of infection. Negative results do not preclude SARS-CoV-2 infection, do not rule out co-infections with other pathogens, and should not be used as the sole basis for treatment or other patient management decisions. Negative results must be combined with clinical observations, patient history, and epidemiological information. The expected result is Negative.  Fact Sheet for Patients: SugarRoll.be  Fact Sheet for Healthcare Providers: https://www.woods-mathews.com/  This test is not yet approved or cleared by the [place] FDA and  has been authorized for detection and/or diagnosis of SARS-CoV-2 by FDA under an Emergency Use Authorization (EUA). This EUA will remain  in effect (meaning this test can be used) for the duration of the COVID-19 declaration under Se ction 564(b)(1) of the Act, 21 U.S.C. section 360bbb-3(b)(1), unless the authorization is terminated or revoked sooner.  Performed at [facility], [place] [address], [place], [place] [address]       Imaging Studies   No results found.   Medications   Scheduled Meds: . Chlorhexidine Gluconate Cloth  6 each Topical Daily  . feeding supplement  237 mL Oral TID BM  . heparin  5,000 Units Subcutaneous Q8H  . multivitamin with minerals  1 tablet Oral Daily  . pantoprazole   40 mg Oral Q0600   Continuous Infusions:      LOS: 9 days    Time spent: 25 minutes with greater than 50% spent at bedside and in coordination of care.    Ezekiel Slocumb, DO [facility]  02/19/2021, 6:41 PM      If 7PM-7AM, please contact night-coverage. How to contact the [facility] Attending or Consulting provider [place] or covering provider during after hours [place], for this patient?    1. Check the care team in [facility] and look for a) attending/consulting [facility] provider listed and b) the [facility] team listed 2. Log into [email or web address] and use 's universal password to access. If you do not have the password, please contact the hospital operator. 3. Locate the [facility] provider you are looking for under [facility] and page to a number that you can be directly reached. 4. If you still have  difficulty reaching the provider, please page the [facility] (Director on Call) for the Hospitalists listed on amion for assistance.

## 2021-02-20 MED ORDER — KETOROLAC TROMETHAMINE 15 MG/ML IJ SOLN
30.0000 mg | Freq: Four times a day (QID) | INTRAMUSCULAR | Status: DC | PRN
  Administered 2021-02-20: 30 mg via INTRAVENOUS
  Filled 2021-02-20: qty 2

## 2021-02-20 MED ORDER — KETOROLAC TROMETHAMINE 15 MG/ML IJ SOLN
15.0000 mg | Freq: Four times a day (QID) | INTRAMUSCULAR | Status: DC | PRN
Start: 2021-02-20 — End: 2021-02-21

## 2021-02-20 NOTE — Progress Notes (Signed)
Occupational Therapy Treatment Patient Details Name: Stefanos Haynesworth MRN: [id number] DOB: 04-25-1941 Today's Date: 02/20/2021    History of present illness Pt is 80 yo male presents to ED on 3/5 with coffee ground emesis, abdominal pain, UTI, aspiration PNA. Renal US shows large posterior bladder mass and mild L hydronephrosis, per urology suspect due to debri from severe infection and not a bladder mass.. Pt with recent admission 01/2021 for AKI with d/c to SNF,  falls at home. PMH includes coronary artery disease, GERD, Gilbert's syndrome, hypertension, spinal stenosis, osteoarthritis.   OT comments  This 80 yo male seen today to focus on adaptive equipment to help him self feed. Pt was not interested at all to be able to drink or feed himself on his own since adaptive equipment is different from at norm. We will continue to work with him on other ADLs and precursors to ADLs instead.  Follow Up Recommendations  SNF;Supervision/Assistance - 24 hour    Equipment Recommendations  Other (comment) (TBD next venue)       Precautions / Restrictions Precautions Precautions: Fall Restrictions Weight Bearing Restrictions: No       Mobility Bed Mobility Overal bed mobility: Needs Assistance Bed Mobility: Rolling Rolling: Max assist                     ADL either performed or assessed with clinical judgement   ADL Overall ADL's : Needs assistance/impaired   Eating/Feeding Details (indicate cue type and reason): If you hand pt a cup or utensil with food in/on it he can feed himself. He just cannot access the food from a bedside tray level. Tried to set up with with some adaptive cup holders and long straws to allow him to be able to drink on his own without having to ask for help eveytime he wanted something to drink. He was not open to any of it due to it was too different from the normal and he said he was okay with someone feeding him and asking for help when he needed to them  to help.                                         Vision Patient Visual Report: No change from baseline            Cognition Arousal/Alertness: Awake/alert Behavior During Therapy: Flat affect Overall Cognitive Status: No family/caregiver present to determine baseline cognitive functioning                                 General Comments: Pt with decreased awareness how not having something between his feet and knees can cause pressure sores (since he tends to lay on his side and his legs are always together. He did agree to let me put a folded over bed pad between his feet, but would not let the RN put anything at his knees.                   Pertinent Vitals/ Pain       Pain Assessment: Faces Faces Pain Scale: Hurts little more Pain Location: LEs with movement (but wanted them moved) Pain Descriptors / Indicators: Grimacing;Guarding;Sore Pain Intervention(s): Limited activity within patient's tolerance;Monitored during session;Repositioned         Frequency  Min 2X/week  Progress Toward Goals  OT Goals(current goals can now be found in the care plan section)  Progress towards OT goals: Not progressing toward goals - comment (Not open to adaptive strategies for self feeding)  Acute Rehab OT Goals Patient Stated Goal: To be fed OT Goal Formulation: With patient Time For Goal Achievement: 03/02/21 Potential to Achieve Goals: Fair ADL Goals Pt/caregiver will Perform Home Exercise Program: (P) Increased ROM;Increased strength;Both right and left upper extremity;With minimal assist (AAROM for shoulders mainly)  Plan Discharge plan remains appropriate       AM-PAC OT "6 Clicks" Daily Activity     Outcome Measure   Help from another person eating meals?: Total Help from another person taking care of personal grooming?: Total Help from another person toileting, which includes using toliet, bedpan, or urinal?: Total Help from  another person bathing (including washing, rinsing, drying)?: Total Help from another person to put on and taking off regular upper body clothing?: Total Help from another person to put on and taking off regular lower body clothing?: Total 6 Click Score: 6    End of Session    OT Visit Diagnosis: Unsteadiness on feet (R26.81);Other abnormalities of gait and mobility (R26.89);Muscle weakness (generalized) (M62.81);Pain;Other symptoms and signs involving cognitive function Pain - Right/Left:  (both) Pain - part of body:  (legs)   Activity Tolerance Patient tolerated treatment well   Patient Left in bed;with call bell/phone within reach;with bed alarm set   Nurse Communication  (recommended to his RN that maybe to try the yellow eggcrate foam she can get from the OR for between his feet and knees.)        Time: 2836-6294 OT Time Calculation (min): 26 min  Charges: OT General Charges $OT Visit: 1 Visit OT Treatments $Self Care/Home Management : 23-37 mins  Golden Circle, OTR/L Acute [employer] Pager [phone] Office [phone]      Almon Register 02/20/2021, 10:17 AM

## 2021-02-20 NOTE — Progress Notes (Signed)
Administered PRN Toradol 30 mg Inj per new order and noted it effective.

## 2021-02-20 NOTE — TOC Progression Note (Signed)
Transition of Care [facility]) - Progression Note    Patient Details  Name: Clinton [name] MRN: [id number] Date of Birth: Aug 19, 1941  Transition of Care [facility]) CM/SW [place], [place] Phone Number: 02/20/2021, 3:53 PM  Clinical Narrative:     CSW started insurance auth. Reference number [id number].   CSW will follow.  Expected Discharge Plan: Skilled Nursing Facility Barriers to Discharge: Continued Medical Work up,Insurance Authorization  Expected Discharge Plan and Services Expected Discharge Plan: [place] arrangements for the past 2 months: Apartment                                       Social Determinants of Health (SDOH) Interventions    Readmission Risk Interventions No flowsheet data found.  Emeterio Reeve, Latanya Presser, [place] Social Worker [phone]

## 2021-02-20 NOTE — Progress Notes (Signed)
Physical Therapy Treatment Patient Details Name: Clinton [name] MRN: [id number] DOB: Nov 30, 1941 Today's Date: 02/20/2021    History of Present Illness Pt is 80 yo male presents to ED on 3/5 with coffee ground emesis, abdominal pain, UTI, aspiration PNA. Renal US shows large posterior bladder mass and mild L hydronephrosis, per urology suspect due to debri from severe infection and not a bladder mass.. Pt with recent admission 01/2021 for AKI with d/c to SNF,  falls at home. PMH includes coronary artery disease, GERD, Gilbert's syndrome, hypertension, spinal stenosis, osteoarthritis.    PT Comments    Pt continues to present with limited active ROM to bilat shoulders, L lateral cervical side bending contracture, minimal active bilat LE movement, unable to achieve bilat knee extension and is very deconditioned limiting his ability to contribute to transfers and causing inability to stand at this time. Pt remains maxAx2 for all transfers and would require a hoyer lift to transfer pt OOB to chair. Pt continues to be appropriate for SNF upon d/c to allow for maximal functional recovery. Acute PT to cont to follow.     Follow Up Recommendations  SNF;Supervision/Assistance - 24 hour     Equipment Recommendations  Other (comment) (TBD at next venue)    Recommendations for Other Services       Precautions / Restrictions Precautions Precautions: Fall Precaution Comments: L cervical side bending contracture, bilat knee flexion contractures Restrictions Weight Bearing Restrictions: No    Mobility  Bed Mobility Overal bed mobility: Needs Assistance Bed Mobility: Rolling;Sidelying to Sit;Sit to Supine Rolling: Max assist Sidelying to sit: Max assist;+2 for physical assistance   Sit to supine: Max assist;HOB elevated   General bed mobility comments: pt attempted to reach across body with R UE for L handrail however unable to reach far enough due to limited shoulder range of motion, pt  unable to push up with L UE requiring maxA for LE managmenet on/off bed and trunk elevation    Transfers                 General transfer comment: unable --to weak  Ambulation/Gait             General Gait Details: unable   Stairs             Wheelchair Mobility    Modified Rankin (Stroke Patients Only)       Balance Overall balance assessment: Needs assistance;History of Falls Sitting-balance support: Feet supported;Bilateral upper extremity supported Sitting balance-Leahy Scale: Poor Sitting balance - Comments: pt wtih posterior and L lateral lean/bend, pt unable to achieve midline posture without max verbal and tactile cues, pt tolerated 10 minutes EOB Postural control: Left lateral lean;Posterior lean                                  Cognition Arousal/Alertness: Awake/alert Behavior During Therapy: Flat affect Overall Cognitive Status: No family/caregiver present to determine baseline cognitive functioning                                 General Comments: pt able to follow commands without delay, pt with improved problem solving and awareness of capabilities, pt converstant but flat affect, did state it was february but able to correct      Exercises Other Exercises Other Exercises: passive stretching of LEs into knee extension in sitting, x5  reps x 30 sec, attempted DF stretch however pt unable to tolerate Other Exercises: worked on cervical R side bending and rotation AA and passive as pt with L lateral bending contracture    General Comments General comments (skin integrity, edema, etc.): pt with dressings on feet to cover sores      Pertinent Vitals/Pain Pain Assessment: Faces Faces Pain Scale: Hurts even more Pain Location: bilat knees with extension ROM Pain Descriptors / Indicators: Grimacing;Guarding;Sore Pain Intervention(s): Limited activity within patient's tolerance    Home Living                       Prior Function            PT Goals (current goals can now be found in the care plan section) Acute Rehab PT Goals Patient Stated Goal: go home Progress towards PT goals: Progressing toward goals    Frequency    Min 2X/week      PT Plan Current plan remains appropriate    Co-evaluation              AM-PAC PT "6 Clicks" Mobility   Outcome Measure  Help needed turning from your back to your side while in a flat bed without using bedrails?: Total Help needed moving from lying on your back to sitting on the side of a flat bed without using bedrails?: Total Help needed moving to and from a bed to a chair (including a wheelchair)?: Total Help needed standing up from a chair using your arms (e.g., wheelchair or bedside chair)?: Total Help needed to walk in hospital room?: Total Help needed climbing 3-5 steps with a railing? : Total 6 Click Score: 6    End of Session   Activity Tolerance: Patient tolerated treatment well Patient left: in bed;with call bell/phone within reach;with bed alarm set Nurse Communication: Mobility status PT Visit Diagnosis: History of falling (Z91.81);Other abnormalities of gait and mobility (R26.89);Muscle weakness (generalized) (M62.81)     Time: 8887-5797 PT Time Calculation (min) (ACUTE ONLY): 28 min  Charges:  $Therapeutic Exercise: 8-22 mins $Therapeutic Activity: 8-22 mins                     Kittie Plater, PT, DPT Acute Rehabilitation Services Pager #: [phone] Office #: [phone]    Berline Lopes 02/20/2021, 11:45 AM

## 2021-02-20 NOTE — Progress Notes (Signed)
On-call [facility] was made aware of patient's c/o right knee pain at 8/10 which could be attributed to arthritis but patient does not want any opioids but wants a stronger pain med than tylenol.  Awaiting for Dr. Cyd Silence reply/order.  Patient was reposition on bed but refused any hot or cold pack.

## 2021-02-20 NOTE — Progress Notes (Signed)
PROGRESS NOTE    Clinton [name]   UYQ:[id number]  DOB: [birth year]  PCP: [name], NP    DOA: 02/10/2021 LOS: 59   Brief Narrative   80 year old male with PMH of CAD, HTN, spinal stenosis, osteoarthritis, Gilbert's syndrome, GERD, debility, chronic urinary tension with bilateral hydronephrosis and recent hospitalization in 01/2021 with AKI presenting with coffee-ground emesis, and admitted for AKI, E. coli bacteremia and UTI, as well as possible aspiration pneumonia.  Patient was treated with IV fluid with improvement in his AKI.  He was also treated for E. coli UTI and bacteremia with improvement in his symptoms.  However, he remains debilitated and therapy recommended SNF.  TOC working on this but he could be in his copay days since he recently left SNF.   Assessment & Plan   Principal Problem:   AKI (acute kidney injury) ([place]) Active Problems:   CAD (coronary artery disease)   HTN (hypertension)   Hyperlipidemia   GERD (gastroesophageal reflux disease)   Acute lower UTI   Hematemesis   Bladder mass   Protein-calorie malnutrition, severe   Acute kidney injury POA, improving.  Likely due to postobstructive uropathy in setting of urinary retention.  Also on lisinopril at home. --Urology consulted --Foley placed for decompression, renal function improving --Monitor renal function  Severe Sepsis due to E. coli UTI and secondary bacteremia Met criteria with leukocytosis, tachypnea,, AKI, hypotension and lactic acidosis.  Blood & urine cultures with pansensitive E. Coli. Completed antibiotics -IV Zosyn on 3/5> IV ceftriaxone through 3/8> IV Ancef through 3/9> IV Unasyn>3/13  Dysphagia ? Aspiration pneumonia  Completed antibiotics as above.  Suspect aspiration during emesis earlier this admission. Evaluated by SLP on 3/8, dysphagia 3 diet recommended, thin liquids, home meds with pure.  Aspiration cautions.  Thrombocytopenia - Resolved.  suspected either sepsis or  possibly antibiotic.  Monitor CBC.   Essential hypertension -normotensive soft BPs while antihypertensives are held.  Continue holding for now to avoid hypotension.  Labile BP today.  Chronic urinary retention with bilateral hydronephrosis -Case was discussed with urology.  CT abdomen pelvis without any masses seen.  Dr. Alinda Money placed Foley catheter for decompression.  Patient to discharge with Foley and have outpatient urology follow-up.  Debility/functional quadriplegia-generalized weakness, more pronounced in lower extremities -Therapy recommended SNF.  TOC working on this but he could be in his copay days since he recently left SNF.  Goal of care - significant comorbidity as above. -Palliative care consulted -Patient changed HCPOA from [name] to [facility]  Severe malnutrition Body mass index is 22.67 kg/m. Nutrition Problem: Severe Malnutrition Etiology: acute illness (AKI) Signs/Symptoms: severe muscle depletion,severe fat depletion Interventions: Ensure Enlive (each supplement provides 350kcal and 20 grams of protein),Magic cup,MVI Pressure Injury 01/19/21 Buttocks Posterior Unblanchable Redness bilateral Buttocks (Active)  01/19/21 1324  Location: Buttocks  Location Orientation: Posterior  Staging:   Wound Description (Comments): Unblanchable Redness bilateral Buttocks  Present on Admission: Yes     DVT prophylaxis: heparin injection 5,000 Units Start: 02/10/21 2200   Diet:  Diet Orders (From admission, onward)    Start     Ordered   02/11/21 0759  DIET DYS 3 Room service appropriate? Yes; Fluid consistency: Thin  Diet effective now       Comments: Heart healthy  Question Answer Comment  Room service appropriate? Yes   Fluid consistency: Thin      02/11/21 0758            Code Status: Full Code  Subjective 02/20/21    Patient sleeping when seen today.  Woke briefly and denied having any acute complaints.  No acute events reported.  Awaiting PT re-evaluation  in order to obtain SNF.   Disposition Plan & Communication   Status is: Inpatient  Remains inpatient appropriate because: Still placement pending, continue monitoring and replacing electrolytes.   Dispo: The patient is from: Home              Anticipated d/c is to: SNF              Patient currently is medically stable for d/c.   Difficult to place patient yes  Family Communication: None at bedside, will attempt to call   Consults, Procedures, Significant Events   Consultants:   Urology  Procedures:   Foley catheter placed  Antimicrobials:  Anti-infectives (From admission, onward)   Start     Dose/Rate Route Frequency Ordered Stop   02/14/21 1500  Ampicillin-Sulbactam (UNASYN) 3 g in sodium chloride 0.9 % 100 mL IVPB  Status:  Discontinued        3 g 200 mL/hr over 30 Minutes Intravenous Every 8 hours 02/14/21 1413 02/18/21 1522   02/13/21 1500  ceFAZolin (ANCEF) IVPB 2g/100 mL premix  Status:  Discontinued        2 g 200 mL/hr over 30 Minutes Intravenous Every 8 hours 02/13/21 1414 02/14/21 1356   02/11/21 2300  cefTRIAXone (ROCEPHIN) 2 g in sodium chloride 0.9 % 100 mL IVPB  Status:  Discontinued        2 g 200 mL/hr over 30 Minutes Intravenous Every 24 hours 02/11/21 1420 02/13/21 1414   02/10/21 2300  cefTRIAXone (ROCEPHIN) 1 g in sodium chloride 0.9 % 100 mL IVPB  Status:  Discontinued        1 g 200 mL/hr over 30 Minutes Intravenous Every 24 hours 02/10/21 2214 02/11/21 1420   02/10/21 1845  piperacillin-tazobactam (ZOSYN) IVPB 3.375 g        3.375 g 100 mL/hr over 30 Minutes Intravenous  Once 02/10/21 1832 02/10/21 2023        Micro    Objective   Vitals:   02/19/21 2300 02/20/21 0500 02/20/21 0538 02/20/21 1407  BP: 139/88  (!) 161/78 (!) 104/57  Pulse: 92  72 67  Resp: 19  20   Temp: 97.6 F (36.4 C)  (!) 97.4 F (36.3 C) 98 F (36.7 C)  TempSrc: Oral     SpO2: 99%  98% 100%  Weight:  73.5 kg    Height:        Intake/Output Summary (Last  24 hours) at 02/20/2021 2011 Last data filed at 02/20/2021 1840 Gross per 24 hour  Intake 590 ml  Output 500 ml  Net 90 ml   Filed Weights   02/14/21 0456 02/17/21 0500 02/20/21 0500  Weight: 70.2 kg 71.7 kg 73.5 kg    Physical Exam:  General exam: Awake alert, no acute distress Respiratory system: CTAB, no wheezes, rales or rhonchi, normal respiratory effort. Cardiovascular system: normal S1/S2, RRR, no pedal edema, 2+radial pulses Gastrointestinal system: soft, NT, ND, +bowel sounds. Genitourinary: Foley catheter in place   Labs   Data Reviewed: I have personally reviewed following labs and imaging studies  CBC: Recent Labs  Lab 02/14/21 0203 02/17/21 0119 02/18/21 0330  WBC 6.3 8.6 8.5  HGB 11.1* 11.6* 11.6*  HCT 32.0* 33.5* 35.0*  MCV 94.4 94.6 95.6  PLT 119* 175 794   Basic Metabolic Panel:  Recent Labs  Lab 02/14/21 0203 02/15/21 0059 02/16/21 0119 02/17/21 0119 02/18/21 0330  NA 135 135 136 136 137  K 3.3* 3.6 3.5 3.3* 4.2  CL 94* 96* 101 102 107  CO2 32 31 29 25 23   GLUCOSE 99 84 74 71 70  BUN 28* 21 17 14 12   CREATININE 1.33* 1.26* 1.19 1.16 1.17  CALCIUM 7.4* 7.5* 7.5* 7.6* 7.8*  MG 2.2 2.0 2.0 2.0 1.9  PHOS 1.8* 2.3* 2.2* 2.2* 2.1*   GFR: Estimated Creatinine Clearance: 52.9 mL/min (by C-G formula based on SCr of 1.17 mg/dL). Liver Function Tests: Recent Labs  Lab 02/14/21 0203 02/15/21 0059 02/17/21 0119 02/18/21 0330  AST 21 22  --   --   ALT 15 11  --   --   ALKPHOS 46 49  --   --   BILITOT 0.6 0.9  --   --   PROT 4.4* 4.7*  --   --   ALBUMIN 1.7* 1.9* 1.8* 1.8*   No results for input(s): LIPASE, AMYLASE in the last 168 hours. No results for input(s): AMMONIA in the last 168 hours. Coagulation Profile: No results for input(s): INR, PROTIME in the last 168 hours. Cardiac Enzymes: No results for input(s): CKTOTAL, CKMB, CKMBINDEX, TROPONINI in the last 168 hours. BNP (last 3 results) No results for input(s): PROBNP in the last  8760 hours. HbA1C: No results for input(s): HGBA1C in the last 72 hours. CBG: No results for input(s): GLUCAP in the last 168 hours. Lipid Profile: No results for input(s): CHOL, HDL, LDLCALC, TRIG, CHOLHDL, LDLDIRECT in the last 72 hours. Thyroid Function Tests: No results for input(s): TSH, T4TOTAL, FREET4, T3FREE, THYROIDAB in the last 72 hours. Anemia Panel: No results for input(s): VITAMINB12, FOLATE, FERRITIN, TIBC, IRON, RETICCTPCT in the last 72 hours. Sepsis Labs: No results for input(s): PROCALCITON, LATICACIDVEN in the last 168 hours.  Recent Results (from the past 240 hour(s))  Urine culture     Status: Abnormal   Collection Time: 02/10/21  9:29 PM   Specimen: Urine, Random  Result Value Ref Range Status   Specimen Description URINE, RANDOM  Final   Special Requests   Final    NONE Performed at [facility], [address], [place], [place] [address]    Culture >=100,000 COLONIES/mL ESCHERICHIA COLI (A)  Final   Report Status 02/13/2021 FINAL  Final   Organism ID, Bacteria ESCHERICHIA COLI (A)  Final      Susceptibility   Escherichia coli - MIC*    AMPICILLIN 8 SENSITIVE Sensitive     CEFAZOLIN <=4 SENSITIVE Sensitive     CEFEPIME <=0.12 SENSITIVE Sensitive     CEFTRIAXONE <=0.25 SENSITIVE Sensitive     CIPROFLOXACIN <=0.25 SENSITIVE Sensitive     GENTAMICIN <=1 SENSITIVE Sensitive     IMIPENEM <=0.25 SENSITIVE Sensitive     NITROFURANTOIN <=16 SENSITIVE Sensitive     TRIMETH/SULFA <=20 SENSITIVE Sensitive     AMPICILLIN/SULBACTAM 4 SENSITIVE Sensitive     PIP/TAZO <=4 SENSITIVE Sensitive     * >=100,000 COLONIES/mL ESCHERICHIA COLI  Culture, blood (Routine X 2) w Reflex to ID Panel     Status: None   Collection Time: 02/12/21 12:21 PM   Specimen: BLOOD LEFT HAND  Result Value Ref Range Status   Specimen Description BLOOD LEFT HAND  Final   Special Requests   Final    BOTTLES DRAWN AEROBIC AND ANAEROBIC Blood Culture adequate volume   Culture   Final  NO GROWTH 5 DAYS Performed at [facility], [place] [address] [place], [place] [address]    Report Status 02/17/2021 FINAL  Final  Culture, blood (Routine X 2) w Reflex to ID Panel     Status: None   Collection Time: 02/12/21 12:22 PM   Specimen: BLOOD RIGHT ARM  Result Value Ref Range Status   Specimen Description BLOOD RIGHT ARM  Final   Special Requests   Final    BOTTLES DRAWN AEROBIC AND ANAEROBIC Blood Culture adequate volume   Culture   Final    NO GROWTH 5 DAYS Performed at [facility], [place] [address] [place], [place] [address]    Report Status 02/17/2021 FINAL  Final  SARS CORONAVIRUS 2 (TAT 6-24 HRS) Nasopharyngeal Nasopharyngeal Swab     Status: None   Collection Time: 02/19/21 11:35 AM   Specimen: Nasopharyngeal Swab  Result Value Ref Range Status   SARS Coronavirus 2 NEGATIVE NEGATIVE Final    Comment: (NOTE) SARS-CoV-2 target nucleic acids are NOT DETECTED.  The SARS-CoV-2 RNA is generally detectable in upper and lower respiratory specimens during the acute phase of infection. Negative results do not preclude SARS-CoV-2 infection, do not rule out co-infections with other pathogens, and should not be used as the sole basis for treatment or other patient management decisions. Negative results must be combined with clinical observations, patient history, and epidemiological information. The expected result is Negative.  Fact Sheet for Patients: SugarRoll.be  Fact Sheet for Healthcare Providers: https://www.woods-mathews.com/  This test is not yet approved or cleared by the [place] FDA and  has been authorized for detection and/or diagnosis of SARS-CoV-2 by FDA under an Emergency Use Authorization (EUA). This EUA will remain  in effect (meaning this test can be used) for the duration of the COVID-19 declaration under Se ction 564(b)(1) of the Act, 21 U.S.C. section 360bbb-3(b)(1), unless the  authorization is terminated or revoked sooner.  Performed at [facility], [place] [address] [place], [place] [address]       Imaging Studies   No results found.   Medications   Scheduled Meds: . Chlorhexidine Gluconate Cloth  6 each Topical Daily  . feeding supplement  237 mL Oral TID BM  . heparin  5,000 Units Subcutaneous Q8H  . multivitamin with minerals  1 tablet Oral Daily  . pantoprazole  40 mg Oral Q0600   Continuous Infusions:      LOS: 10 days    Time spent: 20 minutes    Ezekiel Slocumb, DO [facility]  02/20/2021, 8:11 PM      If 7PM-7AM, please contact night-coverage. How to contact the [facility] Attending or Consulting provider [place] or covering provider during after hours [place], for this patient?    1. Check the care team in [facility] and look for a) attending/consulting [facility] provider listed and b) the [facility] team listed 2. Log into [email or web address] and use [place]'s universal password to access. If you do not have the password, please contact the hospital operator. 3. Locate the [facility] provider you are looking for under [facility] and page to a number that you can be directly reached. 4. If you still have difficulty reaching the provider, please page the [facility] (Director on Call) for the Hospitalists listed on amion for assistance.

## 2021-02-21 LAB — BASIC METABOLIC PANEL
Anion gap: 6 (ref 5–15)
BUN: 15 mg/dL (ref 8–23)
CO2: 23 mmol/L (ref 22–32)
Calcium: 7.7 mg/dL — ABNORMAL LOW (ref 8.9–10.3)
Chloride: 106 mmol/L (ref 98–111)
Creatinine, Ser: 1.24 mg/dL (ref 0.61–1.24)
GFR, Estimated: 59 mL/min — ABNORMAL LOW (ref >60)
Glucose, Bld: 90 mg/dL (ref 70–99)
Potassium: 3.9 mmol/L (ref 3.5–5.1)
Sodium: 135 mmol/L (ref 135–145)

## 2021-02-21 MED ORDER — METOPROLOL TARTRATE 25 MG PO TABS: 12.5000 mg | ORAL_TABLET | Freq: Two times a day (BID) | ORAL | 11 refills | Status: DC

## 2021-02-21 MED ORDER — SENNOSIDES-DOCUSATE SODIUM 8.6-50 MG PO TABS: 1.0000 | ORAL_TABLET | Freq: Every day | ORAL | Status: AC

## 2021-02-21 MED ORDER — ENSURE ENLIVE PO LIQD: 237.0000 mL | Freq: Three times a day (TID) | ORAL | 12 refills | Status: DC

## 2021-02-21 MED ORDER — ADULT MULTIVITAMIN W/MINERALS CH: 1.0000 | ORAL_TABLET | Freq: Every day | ORAL | Status: DC

## 2021-02-21 NOTE — Discharge Summary (Signed)
Discharge Summary  Clinton [name] JME:[id number] DOB: 1941/10/13  PCP: [name], NP  Admit date: 02/10/2021 Discharge date: 02/21/2021  Time spent: 60mns, more than 50% time spent on coordination of care.   Recommendations for Outpatient Follow-up:  1. F/u with SNF MD  for hospital discharge follow up, repeat cbc/bmp at follow up 2. F/u with urology Dr BAlinda Moneyin one week for urodynamic study and foley management 3. Palliative care to continue follow patient at SNF  Discharge Diagnoses:  Active Hospital Problems   Diagnosis Date Noted  . [place]) [place]   . Protein-calorie malnutrition, severe 02/12/2021  . Bladder mass   . Acute lower UTI 02/10/2021  . Hematemesis 02/10/2021  . GERD (gastroesophageal reflux disease) 11/25/2014  . CAD (coronary artery disease) 11/15/2014  . HTN (hypertension) 11/15/2014  . Hyperlipidemia 11/15/2014    Resolved Hospital Problems  No resolved problems to display.    Discharge Condition: stable  Diet recommendation: heart healthy/ dysphagia diet 3/aspiration precaution Diet recommendations: Dysphagia 3 (mechanical soft);Thin liquid Liquids provided via: Straw Medication Administration: Whole meds with puree Supervision: Staff to assist with self feeding;Full supervision/cueing for compensatory strategies Compensations: Slow rate;Minimize environmental distractions;Small sips/bites;Multiple dry swallows after each bite/sip Postural Changes and/or Swallow Maneuvers: Seated upright 90 degrees;Upright 30-60 min after meal Oral Care Recommendations: Oral care BID    Filed Weights   02/17/21 0500 02/20/21 0500 02/21/21 0500  Weight: 71.7 kg 73.5 kg 71.7 kg    History of present illness: ( per admitting MD Dr GJonelle Sidle HPI: Clinton [name] a 80y.o. male with medical history significant of coronary artery disease: GERD, Gilbert's syndrome, hypertension, spinal stenosis, osteoarthritis, who was recently admitted in  February with AKI.  Patient is back again with complaint of vomiting blood.  He had nausea vomiting initially cleared but according to patient the last one appears to be coffee-ground.  This was not observed in the ER.  Stool guaiac is currently pending.  Hemodynamically he appears stable and hemoglobin remains stable.  Patient however was noted to be dehydrated.  He also was noted to be in acute kidney injury once again.  He denied any fever or chills.  He has apparently been living with his son and has been drinking water.  No evidence of obstructive uropathy.  Patient was in the nursing home after leaving the hospital last time but is now home where he went about a week ago.  Since arriving home he has gradually worsened and has not been doing well in the last couple of days.  Patient is being readmitted to the hospital therefore for further evaluation and treatment..  ED Course: Temperature is 98.6 blood pressure 86/69 with a pulse 99 respirate 23 oxygen sats 90% on room air.  Chemistry showed glucose 98 BUN is 58 and creatinine 2.71.  His baseline was 1.19.  Albumin is 2.9.  Total bilirubin 1.3.  Lactic acid 3.0 BNP 437.  CBC entirely within normal.  COVID-19 screen is negative.  Urinalysis showed cloudy urine with moderate hemoglobin.  Has large leukocytes.  WBC more than 50 and many bacteria.  Chest x-ray showed no acute findings.  Patient is being admitted with significant dehydration, UTI as well as AKI.  Hospital Course:  Principal Problem:   [place]) [place] Active Problems:   CAD (coronary artery disease)   HTN (hypertension)   Hyperlipidemia   GERD (gastroesophageal reflux disease)   Acute lower UTI   Hematemesis   Bladder  mass   Protein-calorie malnutrition, severe   Severe Sepsis due to E. coli UTI and secondary bacteremia Met criteria with leukocytosis, tachypnea,, AKI, hypotension and lactic acidosis. Blood &urine cultures with pansensitive E. Coli. Completed  antibiotics -IV Zosyn on 3/5> IV ceftriaxone through 3/8> IV Ancef through 3/9> IV Unasyn>3/13  Urinary retention with bilateral hydronephrosis, AKI -CT chest abdomen pelvis done on admission showed bilateral hydronephrosis and debris within bladder -BUN 58, creatinine 2.71 on presentation -Urology consulted , Foley catheter placed, finished antibiotic treatment,  -BUN 15/creatinine 1.24 at time of discharge -Urology recommend leave Foley catheter in, outpatient urodynamic evaluation before catheter removal   Dysphagia -CT chest on admission showed "Bibasilar airspace opacities most consistent with acute infiltrate. Possibility of aspiration would deserve consideration given the lower lobe predominance. ? Aspiration pneumonia  Completed antibiotics as above.  Suspect aspiration during emesis earlier this admission. Evaluated by SLP on 3/8, dysphagia 3 diet recommended, thin liquids, home meds with pure.  Aspiration cautions. Incentive spirometer  Thrombocytopenia - Resolved.  suspected either sepsis or possibly antibiotic.  Monitor CBC.   Essential hypertension /h/o CAD- normotensive soft BPs while antihypertensives are held.     Discontinue lisinopril and Norvasc, Lopressor resume at lower dose with holding parameters Continue low dose asa Denies chest pain   Debility/functional quadriplegia/goals of care -generalized weakness, more pronounced in lower extremities -Palliative care consulted, detail please refer notes from March 13 -Therapy recommended SNF. TOC working on this but he could be in his copay days since he recently left SNF. -MSW to aid son in identifying what to fill out for medicaid -Patient has HCPOA and Living will on file - Pls review desire for natural death if he declines further -Patient does not appear to have decided on healthcare power of attorney, initially he changed from [place] to [place], then he changed his mind again -recommend continue palliative  care at skilled nursing facility  Severe malnutrition Body mass index is 22.67 kg/m. Nutrition Problem: Severe Malnutrition Etiology: acute illness (AKI) Signs/Symptoms: severe muscle depletion,severe fat depletion Interventions: Ensure Enlive (each supplement provides 350kcal and 20 grams of protein),Magic cup,MVI  Sacral pressure ulcer, present on admission:    Wound / Incision (Open or Dehisced) 02/10/21 Other (Comment) Sacrum (Active)  Date First Assessed/Time First Assessed: 02/10/21 2145   Wound Type: Other (Comment)  Location: Sacrum  Present on Admission: Yes    Assessments 02/10/2021 10:30 PM 02/20/2021  9:07 PM  Dressing Type - Foam - Lift dressing to assess site every shift  Dressing Changed New -  Dressing Status None -  Dressing Change Frequency - PRN  Site / Wound Assessment - Granulation tissue  Treatment Cleansed -     No Linked orders to display     Procedures:  foley placement  Consultations:  Urology  Palliative care  Discharge Exam: BP (!) 102/57 (BP Location: Right Arm)   Pulse (!) 59   Temp (!) 97.2 F (36.2 C)   Resp 19   Ht 5' 10"  (1.778 m)   Wt 71.7 kg   SpO2 99%   BMI 22.67 kg/m   General: Frail, chronically ill-appearing, NAD Cardiovascular: RRR Respiratory: CTABL Extremities: Limited range of motion upper extremities, not able to lift lower extremity against gravity Skin: Chronic scattered ecchymosis  Discharge Instructions You were cared for by a hospitalist during your hospital stay. If you have any questions about your discharge medications or the care you received while you were in the hospital after you are discharged,  you can call the unit and asked to speak with the hospitalist on call if the hospitalist that took care of you is not available. Once you are discharged, your primary care physician will handle any further medical issues. Please note that NO REFILLS for any discharge medications will be authorized once you are  discharged, as it is imperative that you return to your primary care physician (or establish a relationship with a primary care physician if you do not have one) for your aftercare needs so that they can reassess your need for medications and monitor your lab values.  Discharge Instructions    Diet - low sodium heart healthy   Complete by: As directed    Dysphagia 3 diet, aspiration precaution   Discharge wound care:   Complete by: As directed    Pressure off loading measures, skin care q shift   Increase activity slowly   Complete by: As directed      Allergies as of 02/21/2021   No Known Allergies     Medication List    STOP taking these medications   amLODipine 10 MG tablet Commonly known as: NORVASC   lisinopril 20 MG tablet Commonly known as: ZESTRIL     TAKE these medications   aspirin 81 MG chewable tablet Chew 1 tablet (81 mg total) by mouth daily.   calcium carbonate 750 MG chewable tablet Commonly known as: TUMS EX Chew 1 tablet (750 mg total) by mouth 3 (three) times daily. What changed:   when to take this  reasons to take this   feeding supplement Liqd Take 237 mLs by mouth 3 (three) times daily between meals.   metoprolol tartrate 25 MG tablet Commonly known as: LOPRESSOR Take 0.5 tablets (12.5 mg total) by mouth 2 (two) times daily. Hold if sbp less than 100 or heart rate less than 60 What changed:   how much to take  additional instructions   multivitamin with minerals Tabs tablet Take 1 tablet by mouth daily. Start taking on: February 22, 2021   senna-docusate 8.6-50 MG tablet Commonly known as: Senokot-S Take 1 tablet by mouth at bedtime.            Discharge Care Instructions  (From admission, onward)         Start     Ordered   02/21/21 0000  Discharge wound care:       Comments: Pressure off loading measures, skin care q shift   02/21/21 1005         No Known Allergies  Follow-up Information    [name], NP Follow  up.   Specialty: Family Medicine Contact information: [place] [address] [phone]        [name], MD .   Specialty: Cardiology Contact information: [phone] [address] [place] [address] [phone]        Raynelle Bring, MD Follow up in 1 week(s).   Specialty: Urology Why: urodynamic evaluation , foley management  Contact information: [place] [address] [phone]                The results of significant diagnostics from this hospitalization (including imaging, microbiology, ancillary and laboratory) are listed below for reference.    Significant Diagnostic Studies: DG Chest 1 View  Result Date: 02/10/2021 CLINICAL DATA:  Hypotension. EXAM: CHEST  1 VIEW COMPARISON:  January 17, 2021 FINDINGS: Prominent skin fold is seen over the right chest. The  heart, hila, mediastinum, lungs, and pleura are unremarkable. IMPRESSION: No active disease. Electronically Signed   By: Dorise Bullion III M.D   On: 02/10/2021 16:00   US RENAL  Result Date: 02/11/2021 CLINICAL DATA:  Acute renal failure EXAM: RENAL / URINARY TRACT ULTRASOUND COMPLETE COMPARISON:  None. FINDINGS: Right Kidney: Renal measurements: 10.4 x 4.3 x 5.4 cm = volume: 126 mL. Increased cortical echogenicity. No hydronephrosis. Left Kidney: Renal measurements: 10.8 x 5.3 x 5.5 cm = volume: 164.1 mL. Mild hydronephrosis. Increased cortical echogenicity. Bladder: There is a masslike region with internal blood flow in the posterior bladder measuring 15.2 x 4.1 x 12.6 cm. Other: None. IMPRESSION: 1. Large mass in the posterior bladder measuring 15.2 x 4.1 x 12.6 cm with internal blood flow. Recommend urologic consultation. 2. Mild left hydronephrosis. 3. Increased cortical echogenicity in the kidneys bilaterally suggesting medical renal disease. Electronically Signed   By: Dorise Bullion III M.D   On: 02/11/2021 10:01   CT CHEST ABDOMEN PELVIS WO  CONTRAST  Result Date: 02/11/2021 CLINICAL DATA:  Acute renal injury and findings suspicious for bladder mass on recent ultrasound EXAM: CT CHEST, ABDOMEN AND PELVIS WITHOUT CONTRAST TECHNIQUE: Multidetector CT imaging of the chest, abdomen and pelvis was performed following the standard protocol without IV contrast. COMPARISON:  Ultrasound from earlier in the same day. FINDINGS: CT CHEST FINDINGS Cardiovascular: Somewhat limited due to lack of IV contrast. Atherosclerotic calcifications of the thoracic aorta are noted. No aneurysmal dilatation is noted. No cardiac enlargement is seen. Heavy coronary calcifications are noted. No enlargement of the pulmonary artery is seen. Mediastinum/Nodes: Thoracic inlet is within normal limits. No sizable hilar or mediastinal adenopathy is noted. Small sliding-type hiatal hernia is noted. The esophagus is otherwise within normal limits. Lungs/Pleura: Lungs are well aerated bilaterally. Bibasilar airspace opacity is noted consistent with acute infiltrate. Possibility of underlying aspiration would deserve consideration. Small right-sided pleural effusion is noted. No definitive parenchymal nodules are seen. Musculoskeletal: Degenerative changes of the thoracic spine are seen. CT ABDOMEN PELVIS FINDINGS Hepatobiliary: No focal liver abnormality is seen. No gallstones, gallbladder wall thickening, or biliary dilatation. Pancreas: Unremarkable. No pancreatic ductal dilatation or surrounding inflammatory changes. Spleen: Normal in size without focal abnormality. Adrenals/Urinary Tract: Adrenal glands are within normal limits. Kidneys demonstrate fullness of the collecting systems and ureters bilaterally consistent with a significantly distended bladder. No calculi are identified. Focal hyperdensity is noted within the midportion of the left kidney measuring 15 mm likely representing a hyperdense cyst this was not well appreciated on recent ultrasound. Bladder is significantly  distended. Some mild dependent density is seen which would correspond with that seen on prior ultrasound examination likely representing debris. No discrete mass lesion is noted. Stomach/Bowel: Diverticular change of the colon is noted without evidence of diverticulitis. No obstructive or inflammatory changes of the colon are seen. The appendix is not well visualized. No inflammatory changes are seen. The small bowel and stomach are within normal limits aside from the previously mentioned hiatal hernia. Vascular/Lymphatic: Aortic atherosclerosis. No enlarged abdominal or pelvic lymph nodes. Reproductive: Prostate is unremarkable. Other: No abdominal wall hernia or abnormality. No abdominopelvic ascites. Musculoskeletal: Postsurgical and degenerative changes of the lumbar spine are noted. Anterolisthesis of L4 with respect L5 is noted IMPRESSION: CT of the chest: Bibasilar airspace opacities most consistent with acute infiltrate. Possibility of aspiration would deserve consideration given the lower lobe predominance. CT of the abdomen and pelvis: Significant distention of the bladder with subsequent bilateral hydronephrosis. No obstructing calculi  are noted. No discrete bladder mass is seen although some dependent density is noted likely representing debris. Diverticulosis without diverticulitis. Hyperdense lesion within the left kidney likely representing a proteinaceous or hemorrhagic cyst. Electronically Signed   By: Inez Catalina M.D.   On: 02/11/2021 19:24    Microbiology: Recent Results (from the past 240 hour(s))  Culture, blood (Routine X 2) w Reflex to ID Panel     Status: None   Collection Time: 02/12/21 12:21 PM   Specimen: BLOOD LEFT HAND  Result Value Ref Range Status   Specimen Description BLOOD LEFT HAND  Final   Special Requests   Final    BOTTLES DRAWN AEROBIC AND ANAEROBIC Blood Culture adequate volume   Culture   Final    NO GROWTH 5 DAYS Performed at [facility], [place]  [address] [place], [place] [address]    Report Status 02/17/2021 FINAL  Final  Culture, blood (Routine X 2) w Reflex to ID Panel     Status: None   Collection Time: 02/12/21 12:22 PM   Specimen: BLOOD RIGHT ARM  Result Value Ref Range Status   Specimen Description BLOOD RIGHT ARM  Final   Special Requests   Final    BOTTLES DRAWN AEROBIC AND ANAEROBIC Blood Culture adequate volume   Culture   Final    NO GROWTH 5 DAYS Performed at [facility], [place] [address], [place], [place] [address]    Report Status 02/17/2021 FINAL  Final  SARS CORONAVIRUS 2 (TAT 6-24 HRS) Nasopharyngeal Nasopharyngeal Swab     Status: None   Collection Time: 02/19/21 11:35 AM   Specimen: Nasopharyngeal Swab  Result Value Ref Range Status   SARS Coronavirus 2 NEGATIVE NEGATIVE Final    Comment: (NOTE) SARS-CoV-2 target nucleic acids are NOT DETECTED.  The SARS-CoV-2 RNA is generally detectable in upper and lower respiratory specimens during the acute phase of infection. Negative results do not preclude SARS-CoV-2 infection, do not rule out co-infections with other pathogens, and should not be used as the sole basis for treatment or other patient management decisions. Negative results must be combined with clinical observations, patient history, and epidemiological information. The expected result is Negative.  Fact Sheet for Patients: SugarRoll.be  Fact Sheet for Healthcare Providers: https://www.woods-mathews.com/  This test is not yet approved or cleared by the [place] FDA and  has been authorized for detection and/or diagnosis of SARS-CoV-2 by FDA under an Emergency Use Authorization (EUA). This EUA will remain  in effect (meaning this test can be used) for the duration of the COVID-19 declaration under Se ction 564(b)(1) of the Act, 21 U.S.C. section 360bbb-3(b)(1), unless the authorization is terminated or revoked sooner.  Performed at [facility], [place] [address] [place], [place] [address]      Labs: Basic Metabolic Panel: Recent Labs  Lab 02/15/21 0059 02/16/21 0119 02/17/21 0119 02/18/21 0330 02/21/21 0140  NA 135 136 136 137 135  K 3.6 3.5 3.3* 4.2 3.9  CL 96* 101 102 107 106  CO2 31 29 25 23 23   GLUCOSE 84 74 71 70 90  BUN 21 17 14 12 15   CREATININE 1.26* 1.19 1.16 1.17 1.24  CALCIUM 7.5* 7.5* 7.6* 7.8* 7.7*  MG 2.0 2.0 2.0 1.9  --   PHOS 2.3* 2.2* 2.2* 2.1*  --    Liver Function Tests: Recent Labs  Lab 02/15/21 0059 02/17/21 0119 02/18/21 0330  AST 22  --   --  ALT 11  --   --   ALKPHOS 49  --   --   BILITOT 0.9  --   --   PROT 4.7*  --   --   ALBUMIN 1.9* 1.8* 1.8*   No results for input(s): LIPASE, AMYLASE in the last 168 hours. No results for input(s): AMMONIA in the last 168 hours. CBC: Recent Labs  Lab 02/17/21 0119 02/18/21 0330  WBC 8.6 8.5  HGB 11.6* 11.6*  HCT 33.5* 35.0*  MCV 94.6 95.6  PLT 175 206   Cardiac Enzymes: No results for input(s): CKTOTAL, CKMB, CKMBINDEX, TROPONINI in the last 168 hours. BNP: BNP (last 3 results) Recent Labs    01/17/21 0856 02/10/21 2000  BNP 267.4* 437.2*    ProBNP (last 3 results) No results for input(s): PROBNP in the last 8760 hours.  CBG: No results for input(s): GLUCAP in the last 168 hours.     Signed:  Florencia Reasons MD, PhD, FACP  [facility] 02/21/2021, 11:41 AM

## 2021-02-21 NOTE — TOC Transition Note (Signed)
Transition of Care [facility]) - CM/SW Discharge Note   Patient Details  Name: Clinton [name] MRN: [id number] Date of Birth: 07-29-1941  [facility] CM/SW Contact:  Emeterio Reeve, [place] Phone Number: 02/21/2021, 12:12 PM   Clinical Narrative:     Pt will discharge to [facility] via [facility]. Pt is covid negative. Pts insurance Josem Kaufmann has been received. Pts son Wille Glaser has been notified. [facility] transportation has been requested.   Nurse to call report to [phone].   Final next level of care: Skilled Nursing Facility Barriers to Discharge: Barriers Resolved   Patient Goals and CMS Choice Patient states their goals for this hospitalization and ongoing recovery are:: to get better CMS Medicare.gov Compare Post Acute Care list provided to:: Patient Choice offered to / list presented to : Adult Children  Discharge Placement              Patient [facility] bed at: [facility] Patient to be transferred to facility by: [facility] Name of family member notified: Lurline Hare, Son Patient and family notified of of transfer: 02/21/21  Discharge Plan and Services                                     Social Determinants of Health (SDOH) Interventions     Readmission Risk Interventions No flowsheet data found.   Emeterio Reeve, Latanya Presser, [place] Social Worker [phone]

## 2021-03-02 ENCOUNTER — Non-Acute Institutional Stay: Payer: Self-pay | Admitting: Hospice

## 2021-03-02 ENCOUNTER — Other Ambulatory Visit: Payer: Self-pay

## 2021-03-02 DIAGNOSIS — Z515 Encounter for palliative care: Secondary | ICD-10-CM

## 2021-03-02 DIAGNOSIS — M170 Bilateral primary osteoarthritis of knee: Secondary | ICD-10-CM

## 2021-03-02 NOTE — Progress Notes (Signed)
PATIENT NAME: Clinton [name] [id number] [place] [id number] [phone] (home)  DOB: Mar 20, 1941 MRN: [id number]  PRIMARY CARE PROVIDER:    [name], NP,  [place] [address] [phone]  REFERRING PROVIDER:   [name], NP [address] [place],  [place] [address] [phone]  RESPONSIBLE PARTY: Self Emergency contact: son - Spiro Ausborn [phone]  CHIEF COMPLAIN: Initial palliative care visit/Pain in bil knees  Visit is to build trust and highlight Palliative Medicine as specialized medical care for people living with serious illness, aimed at facilitating better quality of life through symptoms relief, assisting with advance care plan and establishing goals of care. Patient endorsed palliative service. Discussion on the difference between Palliative and Hospice care.  Visit consisted of counseling and education dealing with the complex and emotionally intense issues of symptom management and palliative care in the setting of serious and potentially life-threatening illness. Palliative care team will continue to support patient, patient's family, and medical team.  RECOMMENDATIONS/PLAN:   Advance Care Planning: Our advance care planning conversation included a discussion about  the value and importance of advance care planning, exploration of goals of care in the event of a sudden injury or illness, identification and preparation of a healthcare agent and review and updating or creation of an advance directive document. Provided general support, encouragement and ample emotional support.  CODE STATUS: Extensive discussion on ramifications and implications of code status. Patient affirmed he is a Do Not Resuscitate  GOALS OF CARE: Goals of care include to maximize quality of life and symptom management.  I spent  46 minutes providing this initial consultation. More than 50% of the time in this consultation was  spent on counseling patient and coordinating communication. -------------------------------------------------------------------------------------------------------------------------------------- Symptom Management/Plan:  Bil knee pain: Continue with Tylenol and Tramadol as ordered. Continue activities as tolerated. PT/OT is ongoing Patient is bedbound, generalized weakness, more pronounced in lower extremities. Continue Metpprolol for HTN.  Palliative will continue to monitor for symptom management/decline and make recommendations as needed. Return 6 weeks or prn. Encouraged to call provider sooner with any concerns.   PPS: 30%  Family /Caregiver/Community Supports: Patient in SNF for ongoing care    HISTORY OF PRESENT ILLNESS:  Clinton [name] is a 80 y.o. male with multiple medical problems including pain in bilateral knees related to osteoarthritis, chronic, which impairs his actvities of daily living.  it is worse and aggravated with movement , helped with medications. Two Hospitalizations in  the past 2 months; last: Admit date: 02/10/2021 Discharge date: 02/21/2021 for for physical deconditioning. History of HTN, functional quadriplegia, protein caloric malnutrition, coronary artery disease: GERD, Gilbert's syndrome, hypertension, spinal stenosis.  History obtained from review of EMR, discussion with patient/family.   Review and summarization of Epic records shows history from other than patient. Rest of 10 point ROS asked and negative.  Palliative Care was asked to follow this patient by consultation request ofJordan Blattenberger, NP to help address complex decision making in the context of advance care planning and goals of care clarification.    HOSPICE ELIGIBILITY/DIAGNOSIS: TBD  PAST MEDICAL HISTORY:  Past Medical History:  Diagnosis Date  . Anxiety   . Arthritis   . Coronary artery disease    have an appointment with Dr Johnsie Cancel 11/15/14  . Erectile dysfunction   . Fall  01/17/2021  . Frozen shoulder   . GERD (gastroesophageal reflux disease)   . Guillain Barr syndrome [facility]) 2009   Following  the H1 N1 flu vaccine  . History of elevated lipids   . Hypertension   . Osteoarthritis   . Spinal stenosis 11/24/2014     SOCIAL HX:  Social History   Tobacco Use  . Smoking status: Former Smoker    Packs/day: 0.50    Years: 10.00    Pack years: 5.00    Types: Cigarettes    Quit date: 11/11/1974    Years since quitting: 46.3  . Smokeless tobacco: Never Used  . Tobacco comment: age 58 yrs  Substance Use Topics  . Alcohol use: Yes    Comment: occasionally     FAMILY HX:  Family History  Problem Relation Age of Onset  . Heart Problems Father   . Heart disease Father   . Sudden death Father     Review of lab tests/diagnostics   Results for REIN, POPOV (MRN [id number]) as of 03/02/2021 15:30  Ref. Range 02/21/2021 69:62  BASIC METABOLIC PANEL Unknown Rpt (A)  Sodium Latest Ref Range: 135 - 145 mmol/L 135  Potassium Latest Ref Range: 3.5 - 5.1 mmol/L 3.9  Chloride Latest Ref Range: 98 - 111 mmol/L 106  CO2 Latest Ref Range: 22 - 32 mmol/L 23  Glucose Latest Ref Range: 70 - 99 mg/dL 90  BUN Latest Ref Range: 8 - 23 mg/dL 15  Creatinine Latest Ref Range: 0.61 - 1.24 mg/dL 1.24  Calcium Latest Ref Range: 8.9 - 10.3 mg/dL 7.7 (L)  Anion gap Latest Ref Range: 5 - 15  6  GFR, Estimated Latest Ref Range: >60 mL/min 59 (L)   ALLERGIES: No Known Allergies    PERTINENT MEDICATIONS:  Outpatient Encounter Medications as of 03/02/2021  Medication Sig  . aspirin 81 MG chewable tablet Chew 1 tablet (81 mg total) by mouth daily.  . calcium carbonate (TUMS EX) 750 MG chewable tablet Chew 1 tablet (750 mg total) by mouth 3 (three) times daily. (Patient taking differently: Chew 1 tablet by mouth 3 (three) times daily as needed for heartburn (acid reflux).)  . feeding supplement (ENSURE ENLIVE / ENSURE PLUS) LIQD Take 237 mLs by mouth 3 (three) times daily  between meals.  . metoprolol tartrate (LOPRESSOR) 25 MG tablet Take 0.5 tablets (12.5 mg total) by mouth 2 (two) times daily. Hold if sbp less than 100 or heart rate less than 60  . Multiple Vitamin (MULTIVITAMIN WITH MINERALS) TABS tablet Take 1 tablet by mouth daily.  Marland Kitchen senna-docusate (SENOKOT-S) 8.6-50 MG tablet Take 1 tablet by mouth at bedtime.   No facility-administered encounter medications on file as of 03/02/2021.   ROS  General: NAD EYES: No vision changes ENMT:no xerostomia Cardiovascular: No chest pain Pulmonary: No cough, SOB  Abdomen:no constipation or diarrhea GU: No dysuria or urinary frequency MSK:   ROM limitations, no falls reported; endorsed pain in bilateral knees Skin: No rashes or wounds Neurological: weakness Psych:  positive mood    PHYSICAL EXAM  Height: 5 feet 10 inches    Weight: 159.3 Ibs Constitutional: In no acute distress Cardiovascular: regular rate and rhythm; no edema in BLE Pulmonary: no cough, no increased work of breathing, normal respiratory effort Abdomen: soft, non tender, positive bowel sounds in all quadrants GU:  no suprapubic tenderness, Foley cath in place Eyes: Normal lids, no discharge, sclera anicteric ENMT: Moist mucous membranes Musculoskeletal:  Non ambulatory, bedbound Skin:  dry skin,warm without cyanosis Psych: non-anxious affect Neurological: Weakness but otherwise non focal Heme/lymph/immuno: no bruises, no bleeding  Thank you for the  opportunity to participate in the care of Steffen Hase Please call our office at [phone] if we can be of additional assistance.  Note: Portions of this note were generated with Lobbyist. Dictation errors may occur despite best attempts at proofreading.  Teodoro Spray, NP

## 2021-03-05 ENCOUNTER — Other Ambulatory Visit: Payer: Self-pay | Admitting: Adult Health

## 2021-03-05 DIAGNOSIS — I10 Essential (primary) hypertension: Secondary | ICD-10-CM

## 2021-03-16 ENCOUNTER — Other Ambulatory Visit: Payer: Self-pay

## 2021-03-16 ENCOUNTER — Non-Acute Institutional Stay: Payer: Medicare Other | Admitting: Hospice

## 2021-03-16 DIAGNOSIS — M170 Bilateral primary osteoarthritis of knee: Secondary | ICD-10-CM

## 2021-03-16 DIAGNOSIS — Z515 Encounter for palliative care: Secondary | ICD-10-CM

## 2021-03-16 NOTE — Progress Notes (Signed)
[facility] Consult Note Telephone: [phone]  Fax: [phone]  PATIENT NAME: Clinton [name] DOB: [birth year] MRN: [id number]  PRIMARY CARE PROVIDER:   [name], NP [name], NP [place],  [place] [address]  REFERRING PROVIDER: Martinique Miller, NP  RESPONSIBLE PARTY: Self Emergency contact: son - Clinton [name] [phone]   Visit is to build trust and highlight Palliative Medicine as specialized medical care for people living with serious illness, aimed at facilitating better quality of life through symptoms relief, assisting with advance care plan and establishing goals of care.  NP called to Clinton [name] to update on visit; not able to leave voicemail because voice mailbox is full.  CHIEF COMPLAINT: Palliative follow up visit/pain in bilateral knees  RECOMMENDATIONS/PLAN:   1. Advance Care Planning/Code Status: Patient is a DO NOT RESUSCITATE  2. Goals of Care: Goals of care include to maximize quality of life and symptom management.  Visit consisted of counseling and education dealing with the complex and emotionally intense issues of symptom management and palliative care in the setting of serious and potentially life-threatening illness. Palliative care team will continue to support patient, patient's family, and medical team.  3. Symptom management/Plan:   Bilateral knee pain has improved.  Continue PT/OT.  Continue scheduled Tylenol and tramadol for pain.  Gradual dose reduction as tolerated.  Weakness: More pronounced in lower extremities.  Continue PT/OT.  Patient on Levaquin for bilateral basilar pneumonia.  Take as ordered to completion. Palliative will continue to monitor for symptom management/decline and make recommendations as needed. Return 2 months or prn. Encouraged to call provider sooner with any concerns.   HISTORY OF PRESENT ILLNESS: Follow-up visit for Clinton [name], a 80  y.o. male with multiple medical problems including pain in bilateral knees related to osteoarthritis, chronic,  intermittent which impairs his actvities of daily living.  Patient reports ongoing PT OT and his pain medications have been helpful.  He denies pain during visit.  History of HTN, functional quadriplegia, protein caloric malnutrition, coronary artery disease: GERD, Gilbert's syndrome, hypertension, spinal stenosis.  History obtained from review of EMR, discussion with primary nursing staff, patient/family. All 10 point systems reviewed and are negative except as documented in history of present illness above  Review and summarization of Epic records shows history from other than patient.   Palliative Care was asked to follow this patient by consultation request of Martinique Miller, NP to help address complex decision making in the context of advance care planning and goals of care clarification.   CODE STATUS: DNR  PPS: 30%  HOSPICE ELIGIBILITY/DIAGNOSIS: TBD  PAST MEDICAL HISTORY:  Past Medical History:  Diagnosis Date  . Anxiety   . Arthritis   . Coronary artery disease    have an appointment with Dr Johnsie Cancel 11/15/14  . Erectile dysfunction   . Fall 01/17/2021  . Frozen shoulder   . GERD (gastroesophageal reflux disease)   . Guillain Barr syndrome ([place]) 2009   Following the H1 N1 flu vaccine  . History of elevated lipids   . Hypertension   . Osteoarthritis   . Spinal stenosis 11/24/2014     SOCIAL HX: @SOCX  Patient lives at SNF for ongoing care   FAMILY HX:  Family History  Problem Relation Age of Onset  . Heart Problems Father   . Heart disease Father   . Sudden death Father     Review lab tests/diagnostics REA NITROGEN (BUN) 14 mg/dL 7-25  Final        CREATININE 1.01 mg/dL 0.70-1.18  Final    For patients >76 years of age, the reference limit for Creatinine is approximately 13% higher for people identified as African-American.       eGFR NON-AFR.  AMERICAN 70 mL/min/1.52m > OR = 60  Final        eGFR AFRICAN AMERICAN 82 mL/min/1.719m> OR = 60  Final        BUN/CREATININE RATIO NOT APPLICABLE (calc) 6-6-33Final        SODIUM 139 mmol/L 135-146  Final        POTASSIUM 4.3 mmol/L 3.5-5.3  Final        CHLORIDE 108 mmol/L 98-110  Final        CARBON DIOXIDE 25 mmol/L 20-32  Final        CALCIUM 8.2 mg/dL 8.6-10.3 L Final        PROTEIN, TOTAL 4.8 g/dL 6.1-8.1 L Final        ALBUMIN 2.5 g/dL 3.6-5.1 L Final        GLOBULIN 2.3 g/dL (calc) 1.9-3.7  Final        ALBUMIN/GLOBULIN RATIO 1.1 (calc) 1.0-2.5  Final        BILIRUBIN, TOTAL 0.3 mg/dL 0.2-1.2  Final        ALKALINE PHOSPHATASE 51 U/L 35-144  Final        AST 13 U/L 10-35  Final        ALT 10 U/L 9-46  Final     CBC (INCLUDES DIFF/PLT)     WHITE BLOOD CELL COUNT 4.8 Thousand/uL 3.8-10.8  Final        RED BLOOD CELL COUNT 3.24 Million/uL 4.20-5.80 L Final        HEMOGLOBIN 10.3 g/dL 13.2-17.1 L Final        HEMATOCRIT 31.2 % 38.5-50.0 L Final        MCV 96.3 fL 80.0-100.0  Final        MCH 31.8 pg 27.0-33.0  Final        MCHC 33.0 g/dL 32.0-36.0  Final        RDW 13.0 % 11.0-15.0  Final        PLATELET COUNT 310 Thousand/uL 140-400  Final        MPV 9.9 fL 7.5-12.5  Final        ABSOLUTE NEUTROPHILS 3029 cells/uL 1500-7800  Final        ABSOLUTE LYMPHOCYTES 1200 cells/uL [phone]  Final        ABSOLUTE MONOCYTES 360 cells/uL 200-950  Final        ABSOLUTE EOSINOPHILS 182 cells/uL 15-500  Final        ABSOLUTE BASOPHILS 29 cells/uL 0-200  Final        NEUTROPHILS 63.1 %   Final        LYMPHOCYTES 25.0 %   Final        MONOCYTES 7.5 %   Final        EOSINOPHILS 3.8 %   Final        BASOPHILS 0.6 %   Final    Chest x-ray results of for 04/2021 shows bilateral basilar pneumonia-currently treated with Levaquin. ALLERGIES: No Known Allergies    PERTINENT MEDICATIONS:   Outpatient Encounter Medications as of 03/16/2021  Medication Sig  . aspirin 81 MG chewable tablet Chew 1 tablet (81 mg total) by mouth daily.  . calcium carbonate (TUMS EX) 750 MG chewable  tablet Chew 1 tablet (750 mg total) by mouth 3 (three) times daily. (Patient taking differently: Chew 1 tablet by mouth 3 (three) times daily as needed for heartburn (acid reflux).)  . feeding supplement (ENSURE ENLIVE / ENSURE PLUS) LIQD Take 237 mLs by mouth 3 (three) times daily between meals.  . metoprolol tartrate (LOPRESSOR) 25 MG tablet Take 0.5 tablets (12.5 mg total) by mouth 2 (two) times daily. Hold if sbp less than 100 or heart rate less than 60  . Multiple Vitamin (MULTIVITAMIN WITH MINERALS) TABS tablet Take 1 tablet by mouth daily.  Marland Kitchen senna-docusate (SENOKOT-S) 8.6-50 MG tablet Take 1 tablet by mouth at bedtime.   No facility-administered encounter medications on file as of 03/16/2021.     PHYSICAL EXAM  General: In no acute distress,  Cardiovascular: regular rate and rhythm; no edema in BLE Pulmonary: no cough during visit, mild Rales in bilateral bases, no increased work of breathing, normal respiratory effort Abdomen: soft, non tender, positive bowel sounds in all quadrants GU:  no suprapubic tenderness Eyes: Normal lids, no discharge, sclera anicteric ENMT: Moist mucous membranes Musculoskeletal: Weakness, no redness or warmth to bilateral knees Skin: no rash to visible skin, warm without cyanosis Psych: non-anxious affect Neurological: Weakness but otherwise non focal Heme/lymph/immuno: no bruises, no bleeding  I spent 50 minutes providing this consultation; time includes spent with patient/family, chart review and documentation. More than 50% of the time in this consultation was spent on coordinating communication   Thank you for the opportunity to participate in the care of Clinton [name] Please call our office at [phone] if we can be of additional assistance.  Note:  Portions of this note were generated with Lobbyist. Dictation errors may occur despite best attempts at proofreading.  Teodoro Spray, NP

## 2021-05-29 ENCOUNTER — Non-Acute Institutional Stay: Payer: Medicare Other | Admitting: Hospice

## 2021-05-29 ENCOUNTER — Other Ambulatory Visit: Payer: Self-pay

## 2021-05-29 DIAGNOSIS — Z515 Encounter for palliative care: Secondary | ICD-10-CM

## 2021-05-29 DIAGNOSIS — M170 Bilateral primary osteoarthritis of knee: Secondary | ICD-10-CM

## 2021-05-29 DIAGNOSIS — R531 Weakness: Secondary | ICD-10-CM

## 2021-05-29 NOTE — Progress Notes (Signed)
[facility] Consult Note Telephone: [phone]  Fax: [phone]  PATIENT NAME: Clinton [name] DOB: 25-Aug-1941 MRN: [id number]  PRIMARY CARE PROVIDER:   [name], NP [name], NP [address] [place],  [place] [address] REFERRING PROVIDER: Martinique Miller, NP   RESPONSIBLE PARTY: Self Emergency contact: son - Tramar Brueckner [phone]  RESPONSIBLE PARTY:   Contact Information     Name Relation Home Work Mobile   Jerald, [place]   [phone]   Erich, [place] [phone]         Visit is to build trust and highlight Palliative Medicine as specialized medical care for people living with serious illness, aimed at facilitating better quality of life through symptoms relief, assisting with advance care planning and complex medical decision making. This is a follow up visit.    RECOMMENDATIONS/PLAN:    Advance Care Planning/Code Status: Patient is a DO NOT RESUSCITATE   Goals of Care: Goals of care include to maximize quality of life and symptom management.   Visit consisted of counseling and education dealing with the complex and emotionally intense issues of symptom management and palliative care in the setting of serious and potentially life-threatening illness. Palliative care team will continue to support patient, patient's family, and medical team.   3. Symptom management/Plan:  Osteoarthritis related bilateral knee pain comes and goes but better than before.  Continue PT/OT.  Continue scheduled Tylenol and tramadol for pain.   Weakness: Continue PT/OT for strengthening Bilateral basilar pneumonia.  Patient completed Levaquin. Reports no cough, chest pain or malaise. He reports feeling ok with no complaint.  Palliative will continue to monitor for symptom management/decline and make recommendations as needed. Return 2 months or prn. Encouraged to call provider sooner with any concerns.  CHIEF  COMPLAINT: Palliative follow up visit HISTORY OF PRESENT ILLNESS: Follow-up visit for Clinton [name], a 80 y.o. male with multiple medical problems including pain in bilateral knees related to osteoarthritis- intermittent, a little improved. History of HTN, functional quadriplegia, protein caloric malnutrition, coronary artery disease: GERD, Gilbert's syndrome, hypertension, spinal stenosis.  History obtained from review of EMR, discussion with primary nursing staff, patient/family. All 10 point systems reviewed and are negative except as documented in history of present illness above   Review and summarization of Epic records shows history from other than patient.   Palliative Care was asked to follow this patient by consultation request of Martinique Miller, NP to help address complex decision making in the context of advance care planning and goals of care clarification  PHYSICAL EXAM  General: In no acute distress, appropriately dressed Cardiovascular: regular rate and rhythm; no edema in BLE Pulmonary: no cough, no increased work of breathing, normal respiratory effort Abdomen: soft, non tender, no guarding, positive bowel sounds in all quadrants GU:  no suprapubic tenderness Eyes: Normal lids, no discharge ENMT: Moist mucous membranes Musculoskeletal:  weakness, sarcopenia Skin: no rash to visible skin, warm without cyanosis,  Psych: non-anxious affect Neurological: Weakness but otherwise non focal Heme/lymph/immuno: no bruises, no bleeding  PERTINENT MEDICATIONS:  Outpatient Encounter Medications as of 05/29/2021  Medication Sig   aspirin 81 MG chewable tablet Chew 1 tablet (81 mg total) by mouth daily.   calcium carbonate (TUMS EX) 750 MG chewable tablet Chew 1 tablet (750 mg total) by mouth 3 (three) times daily. (Patient taking differently: Chew 1 tablet by mouth 3 (three) times daily as needed for heartburn (acid reflux).)   feeding supplement (ENSURE  ENLIVE / ENSURE PLUS) LIQD  Take 237 mLs by mouth 3 (three) times daily between meals.   metoprolol tartrate (LOPRESSOR) 25 MG tablet Take 0.5 tablets (12.5 mg total) by mouth 2 (two) times daily. Hold if sbp less than 100 or heart rate less than 60   Multiple Vitamin (MULTIVITAMIN WITH MINERALS) TABS tablet Take 1 tablet by mouth daily.   senna-docusate (SENOKOT-S) 8.6-50 MG tablet Take 1 tablet by mouth at bedtime.   No facility-administered encounter medications on file as of 05/29/2021.    HOSPICE ELIGIBILITY/DIAGNOSIS: TBD  PAST MEDICAL HISTORY:  Past Medical History:  Diagnosis Date   Anxiety    Arthritis    Coronary artery disease    have an appointment with Dr Johnsie Cancel 11/15/14   Erectile dysfunction    Fall 01/17/2021   Frozen shoulder    GERD (gastroesophageal reflux disease)    Guillain Barr syndrome ([facility]) 2009   Following the H1 N1 flu vaccine   History of elevated lipids    Hypertension    Osteoarthritis    Spinal stenosis 11/24/2014     ALLERGIES: No Known Allergies    I spent 50 minutes providing this consultation; this includes time spent with patient/family, chart review and documentation. More than 50% of the time in this consultation was spent on counseling and coordinating communication   Thank you for the opportunity to participate in the care of Clinton [name] Please call our office at [phone] if we can be of additional assistance.  Note: Portions of this note were generated with Lobbyist. Dictation errors may occur despite best attempts at proofreading.  Teodoro Spray, NP

## 2021-06-29 ENCOUNTER — Other Ambulatory Visit: Payer: Self-pay

## 2021-06-29 ENCOUNTER — Non-Acute Institutional Stay: Payer: Medicare Other | Admitting: Hospice

## 2021-06-29 DIAGNOSIS — M170 Bilateral primary osteoarthritis of knee: Secondary | ICD-10-CM

## 2021-06-29 DIAGNOSIS — Z515 Encounter for palliative care: Secondary | ICD-10-CM

## 2021-06-29 DIAGNOSIS — K5901 Slow transit constipation: Secondary | ICD-10-CM

## 2021-06-29 NOTE — Progress Notes (Signed)
[facility] Consult Note Telephone: [phone]  Fax: [phone]  PATIENT NAME: Clinton [name] DOB: 07/21/1941 MRN: [id number]  PRIMARY CARE PROVIDER:   [name], NP Clinton Peng, NP [facility] [address] [place],  [place] [address]   REFERRING PROVIDER: Martinique Miller, NP   RESPONSIBLE PARTY: Self Emergency contact: son - Clinton [name] [phone] Contact Information     Name Relation Home Work Mobile   Clinton, [name]   [phone]   Clinton, [name] [phone]         Visit is to build trust and highlight Palliative Medicine as specialized medical care for people living with serious illness, aimed at facilitating better quality of life through symptoms relief, assisting with advance care planning and complex medical decision making. This is a follow up visit.  RECOMMENDATIONS/PLAN:   Advance Care Planning/Code Status:Patient is a Full code  Goals of Care: Goals of care include to maximize quality of life and symptom management.  Visit consisted of counseling and education dealing with the complex and emotionally intense issues of symptom management and palliative care in the setting of serious and potentially life-threatening illness. Palliative care team will continue to support patient, patient's family, and medical team.  Symptom management/Plan:  Bil Knee pain: Managed with Tylenol and Tramadol. HTN: Managed with metoprolol.  Systolic BP in the [id number] with occasional outlier. Constipation: Senna S; effective.  Follow-up appointment with [facility] for urodynamic study and transrectal ultrasound.  Follow up: Palliative care will continue to follow for complex medical decision making, advance care planning, and clarification of goals. Return 6 weeks or prn. Encouraged to call provider sooner with any concerns.  CHIEF COMPLAINT: Palliative follow up  HISTORY OF PRESENT ILLNESS:  Clinton [name] a 80  y.o. male with multiple medical problems including bilateral knees related to osteoarthritis,  HTN, functional quadriplegia, protein caloric malnutrition, coronary artery disease, GERD, Gilbert's syndrome, hypertension, spinal stenosis.patient reports his pain is well managed with current regimen; denies cough/respiratory distress, no pain/discomfort.  He is bedbound max assist for transfers.  History obtained from review of EMR, discussion with primary team, family and/or patient. Records reviewed and summarized above. All 10 point systems reviewed and are negative except as documented in history of present illness above  Review and summarization of Epic records shows history from other than patient.   Palliative Care was asked to follow this patient o help address complex decision making in the context of advance care planning and goals of care clarification.   PHYSICAL EXAM  General: In no acute distress, appropriately dressed Cardiovascular: regular rate and rhythm; no edema in BLE Pulmonary: no cough, no increased work of breathing, normal respiratory effort Abdomen: soft, non tender, no guarding, positive bowel sounds in all quadrants GU:  no suprapubic tenderness, Foley cath in place Eyes: Normal lids, no discharge ENMT: Moist mucous membranes Musculoskeletal:  weakness, sarcopenia Skin: no rash to visible skin, warm without cyanosis,  Psych: non-anxious affect Neurological: Weakness but otherwise non focal Heme/lymph/immuno: no bruises, no bleeding  PERTINENT MEDICATIONS:  Outpatient Encounter Medications as of 06/29/2021  Medication Sig   aspirin 81 MG chewable tablet Chew 1 tablet (81 mg total) by mouth daily.   calcium carbonate (TUMS EX) 750 MG chewable tablet Chew 1 tablet (750 mg total) by mouth 3 (three) times daily. (Patient taking differently: Chew 1 tablet by mouth 3 (three) times daily as needed for heartburn (acid reflux).)   feeding supplement (ENSURE ENLIVE / ENSURE  PLUS) LIQD Take 237 mLs by mouth 3 (three) times daily between meals.   metoprolol tartrate (LOPRESSOR) 25 MG tablet Take 0.5 tablets (12.5 mg total) by mouth 2 (two) times daily. Hold if sbp less than 100 or heart rate less than 60   Multiple Vitamin (MULTIVITAMIN WITH MINERALS) TABS tablet Take 1 tablet by mouth daily.   senna-docusate (SENOKOT-S) 8.6-50 MG tablet Take 1 tablet by mouth at bedtime.   No facility-administered encounter medications on file as of 06/29/2021.    HOSPICE ELIGIBILITY/DIAGNOSIS: TBD  PAST MEDICAL HISTORY:  Past Medical History:  Diagnosis Date   Anxiety    Arthritis    Coronary artery disease    have an appointment with Clinton [name] 11/15/14   Erectile dysfunction    Fall 01/17/2021   Frozen shoulder    GERD (gastroesophageal reflux disease)    Guillain Barr syndrome ([place]) 2009   Following the H1 N1 flu vaccine   History of elevated lipids    Hypertension    Osteoarthritis    Spinal stenosis 11/24/2014     ALLERGIES: No Known Allergies    I spent  40 minutes providing this consultation; this includes time spent with patient/family, chart review and documentation. More than 50% of the time in this consultation was spent on counseling and coordinating communication   Thank you for the opportunity to participate in the care of Clinton [name] Please call our office at [phone] if we can be of additional assistance.  Note: Portions of this note were generated with Lobby[employer]. Dictation errors may occur despite best attempts at proofreading.  Clinton Spray, NP

## 2021-09-17 ENCOUNTER — Emergency Department (HOSPITAL_COMMUNITY): Payer: Medicare Other

## 2021-09-17 ENCOUNTER — Emergency Department (HOSPITAL_COMMUNITY)
Admission: EM | Admit: 2021-09-17 | Discharge: 2021-09-17 | Disposition: A | Discharge status: Home / Self Care | Payer: Medicare Other | Attending: Emergency Medicine | Admitting: Emergency Medicine

## 2021-09-17 ENCOUNTER — Encounter (HOSPITAL_COMMUNITY): Payer: Self-pay | Admitting: *Deleted

## 2021-09-17 ENCOUNTER — Other Ambulatory Visit: Payer: Self-pay

## 2021-09-17 DIAGNOSIS — R55 Syncope and collapse: Secondary | ICD-10-CM | POA: Diagnosis present

## 2021-09-17 DIAGNOSIS — I509 Heart failure, unspecified: Secondary | ICD-10-CM | POA: Insufficient documentation

## 2021-09-17 DIAGNOSIS — I110 Hypertensive heart disease with heart failure: Secondary | ICD-10-CM | POA: Insufficient documentation

## 2021-09-17 DIAGNOSIS — Z20822 Contact with and (suspected) exposure to covid-19: Secondary | ICD-10-CM | POA: Insufficient documentation

## 2021-09-17 DIAGNOSIS — R531 Weakness: Secondary | ICD-10-CM | POA: Insufficient documentation

## 2021-09-17 DIAGNOSIS — Z7982 Long term (current) use of aspirin: Secondary | ICD-10-CM | POA: Insufficient documentation

## 2021-09-17 DIAGNOSIS — Z87891 Personal history of nicotine dependence: Secondary | ICD-10-CM | POA: Insufficient documentation

## 2021-09-17 DIAGNOSIS — R001 Bradycardia, unspecified: Secondary | ICD-10-CM | POA: Insufficient documentation

## 2021-09-17 DIAGNOSIS — I2510 Atherosclerotic heart disease of native coronary artery without angina pectoris: Secondary | ICD-10-CM | POA: Insufficient documentation

## 2021-09-17 LAB — URINALYSIS, ROUTINE W REFLEX MICROSCOPIC
Bilirubin Urine: NEGATIVE
Glucose, UA: NEGATIVE mg/dL
Ketones, ur: NEGATIVE mg/dL
Nitrite: POSITIVE — AB
Protein, ur: 100 mg/dL — AB
RBC / HPF: >50 RBC/hpf — ABNORMAL HIGH (ref 0–5)
Specific Gravity, Urine: 1.014 (ref 1.005–1.030)
WBC, UA: >50 WBC/hpf — ABNORMAL HIGH (ref 0–5)
pH: 8 (ref 5.0–8.0)

## 2021-09-17 LAB — BASIC METABOLIC PANEL
Anion gap: 7 (ref 5–15)
BUN: 31 mg/dL — ABNORMAL HIGH (ref 8–23)
CO2: 26 mmol/L (ref 22–32)
Calcium: 9.3 mg/dL (ref 8.9–10.3)
Chloride: 105 mmol/L (ref 98–111)
Creatinine, Ser: 1.05 mg/dL (ref 0.61–1.24)
GFR, Estimated: >60 mL/min (ref >60)
Glucose, Bld: 99 mg/dL (ref 70–99)
Potassium: 4.2 mmol/L (ref 3.5–5.1)
Sodium: 138 mmol/L (ref 135–145)

## 2021-09-17 LAB — RESP PANEL BY RT-PCR (FLU A&B, COVID) ARPGX2
Influenza A by PCR: NEGATIVE
Influenza B by PCR: NEGATIVE
SARS Coronavirus 2 by RT PCR: NEGATIVE

## 2021-09-17 LAB — HEPATIC FUNCTION PANEL
ALT: 16 U/L (ref 0–44)
AST: 22 U/L (ref 15–41)
Albumin: 3.3 g/dL — ABNORMAL LOW (ref 3.5–5.0)
Alkaline Phosphatase: 52 U/L (ref 38–126)
Bilirubin, Direct: 0.1 mg/dL (ref 0.0–0.2)
Indirect Bilirubin: 0.6 mg/dL (ref 0.3–0.9)
Total Bilirubin: 0.7 mg/dL (ref 0.3–1.2)
Total Protein: 6.8 g/dL (ref 6.5–8.1)

## 2021-09-17 LAB — CBC
HCT: 39 % (ref 39.0–52.0)
Hemoglobin: 12.6 g/dL — ABNORMAL LOW (ref 13.0–17.0)
MCH: 32.1 pg (ref 26.0–34.0)
MCHC: 32.3 g/dL (ref 30.0–36.0)
MCV: 99.2 fL (ref 80.0–100.0)
Platelets: 261 10*3/uL (ref 150–400)
RBC: 3.93 MIL/uL — ABNORMAL LOW (ref 4.22–5.81)
RDW: 13.5 % (ref 11.5–15.5)
WBC: 9.3 10*3/uL (ref 4.0–10.5)
nRBC: 0 % (ref 0.0–0.2)

## 2021-09-17 LAB — MAGNESIUM: Magnesium: 2.2 mg/dL (ref 1.7–2.4)

## 2021-09-17 LAB — TROPONIN I (HIGH SENSITIVITY)
Troponin I (High Sensitivity): 6 ng/L (ref <18)
Troponin I (High Sensitivity): 6 ng/L (ref <18)

## 2021-09-17 LAB — CBG MONITORING, ED: Glucose-Capillary: 89 mg/dL (ref 70–99)

## 2021-09-17 MED ORDER — CEPHALEXIN 500 MG PO CAPS: 500.0000 mg | ORAL_CAPSULE | Freq: Four times a day (QID) | ORAL | 0 refills | Status: DC

## 2021-09-17 MED ORDER — SODIUM CHLORIDE 0.9 % IV SOLN
1.0000 g | Freq: Once | INTRAVENOUS | Status: AC
  Administered 2021-09-17: 1 g via INTRAVENOUS
  Filled 2021-09-17: qty 10

## 2021-09-17 NOTE — Discharge Instructions (Signed)
Stop taking your metoprolol.  Call the number below to schedule a follow-up appointment with a cardiologist.  Additionally, there is a prescription attached for treatment of a urine infection.  There is a separate number below to call to follow-up with urology for plans regarding your indwelling Foley catheter.  Please return to emergency department for any return of concerning symptoms.

## 2021-09-17 NOTE — ED Provider Notes (Signed)
[facility] DEPT Provider Note   CSN: [id number] Arrival date & time: 09/17/21  1518     History No chief complaint on file.   Clinton [name] is a 80 y.o. male.  HPI Patient presents for syncopal episode.  This was witnessed at the lobby of [facility], where he resides.  He was reportedly in a wheelchair and slumped over and went unresponsive.  He states that, at the time, he was drinking his third cup of coffee for the day.  He endorses some associated dizziness with syncopal episode.  Per EMS report, he was initially bradycardic and hypotensive when they arrived on scene.  Blood pressure subsequently normalized.  Medical history notable for functional quadriplegia, malnutrition, hypertension, dysphagia, and hospitalization for sepsis in March.  Patient is not certain if he has had a stroke in the past.  At baseline, he is bedbound.  He has a chronic indwelling Foley catheter.  He has baseline global weakness.  He states that he has been leaning more to the left side and that this has been noticed by his nursing facility staff recently.  He denies any current acute physical symptoms.    Past Medical History:  Diagnosis Date   Anxiety    Arthritis    Coronary artery disease    have an appointment with Dr Johnsie Cancel 11/15/14   Erectile dysfunction    Fall 01/17/2021   Frozen shoulder    GERD (gastroesophageal reflux disease)    Guillain Barr syndrome ([place]) 2009   Following the H1 N1 flu vaccine   History of elevated lipids    Hypertension    Osteoarthritis    Spinal stenosis 11/24/2014    Patient Active Problem List   Diagnosis Date Noted   Protein-calorie malnutrition, severe 02/12/2021   Bladder mass    Acute lower UTI 02/10/2021   Hematemesis 02/10/2021   Thrombocytopenia ([place]) 01/25/2021   Acute CHF (congestive heart failure) ([place]) 01/17/2021   Hypertensive urgency 01/17/2021   Bradycardia 01/17/2021   Fall 01/17/2021   Near  syncope 01/17/2021   AKI (acute kidney injury) ([place])    Rotator cuff tear arthropathy of both shoulders 01/25/2016   Degenerative arthritis of knee, bilateral 01/25/2016   Status post lumbar spinal fusion 11/29/2014   GERD (gastroesophageal reflux disease) 11/25/2014   Spinal stenosis 11/24/2014   CAD (coronary artery disease) 11/15/2014   HTN (hypertension) 11/15/2014   Hyperlipidemia 11/15/2014    Past Surgical History:  Procedure Laterality Date   CARDIAC SURGERY     2 stents placed    CORONARY ANGIOPLASTY     mid and proximal LAD stent 09/[facility]   posterior lumbar fusion L4-5  11-24-14       Family History  Problem Relation Age of Onset   Heart Problems Father    Heart disease Father    Sudden death Father     Social History   Tobacco Use   Smoking status: Former    Packs/day: 0.50    Years: 10.00    Pack years: 5.00    Types: Cigarettes    Quit date: 11/11/1974    Years since quitting: 46.8   Smokeless tobacco: Never   Tobacco comments:    age 76 yrs  Vaping Use   Vaping Use: Never used  Substance Use Topics   Alcohol use: Yes    Comment: occasionally   Drug use: No    Home Medications Prior to Admission medications   Medication  Sig Start Date End Date Taking? Authorizing Provider  cephALEXin (KEFLEX) 500 MG capsule Take 1 capsule (500 mg total) by mouth 4 (four) times daily. 09/17/21  Yes Godfrey Pick, MD  aspirin 81 MG chewable tablet Chew 1 tablet (81 mg total) by mouth daily. 02/06/21   Medina-Vargas, Monina C, NP  calcium carbonate (TUMS EX) 750 MG chewable tablet Chew 1 tablet (750 mg total) by mouth 3 (three) times daily. Patient taking differently: Chew 1 tablet by mouth 3 (three) times daily as needed for heartburn (acid reflux). 02/06/21   Medina-Vargas, Monina C, NP  feeding supplement (ENSURE ENLIVE / ENSURE PLUS) LIQD Take 237 mLs by mouth 3 (three) times daily between meals. 02/21/21   Florencia Reasons, MD  Multiple Vitamin  (MULTIVITAMIN WITH MINERALS) TABS tablet Take 1 tablet by mouth daily. 02/22/21   Florencia Reasons, MD  senna-docusate (SENOKOT-S) 8.6-50 MG tablet Take 1 tablet by mouth at bedtime. 02/21/21   Florencia Reasons, MD    Allergies    Patient has no known allergies.  Review of Systems   Review of Systems  Constitutional:  Negative for chills, fatigue and fever.  HENT:  Positive for drooling (Chronic). Negative for congestion, ear pain and sore throat.   Eyes:  Negative for pain and visual disturbance.  Respiratory:  Negative for cough, chest tightness, shortness of breath and wheezing.   Cardiovascular:  Negative for chest pain, palpitations and leg swelling.  Gastrointestinal:  Negative for abdominal pain, diarrhea, nausea and vomiting.  Genitourinary:  Positive for difficulty urinating (Chronic indwelling Foley catheter). Negative for dysuria, flank pain and hematuria.  Musculoskeletal:  Negative for arthralgias, back pain, joint swelling and neck pain.  Skin:  Negative for color change and rash.  Neurological:  Positive for dizziness (Resolved), syncope and weakness (Generalized, chronic). Negative for seizures, speech difficulty, numbness and headaches.  Psychiatric/Behavioral:  Negative for confusion and decreased concentration.   All other systems reviewed and are negative.  Physical Exam Updated Vital Signs BP 117/88   Pulse (!) 52   Temp 97.9 F (36.6 C) (Oral)   Resp 16   Ht 5\' 10"  (1.778 m)   Wt 72 kg   SpO2 100%   BMI 22.78 kg/m   Physical Exam Vitals and nursing note reviewed.  Constitutional:      General: He is not in acute distress.    Appearance: He is well-developed. He is ill-appearing (Chronically). He is not toxic-appearing or diaphoretic.  HENT:     Head: Normocephalic and atraumatic.     Right Ear: External ear normal.     Left Ear: External ear normal.     Nose: Nose normal.     Mouth/Throat:     Mouth: Mucous membranes are moist.  Eyes:     Extraocular Movements:  Extraocular movements intact.     Conjunctiva/sclera: Conjunctivae normal.  Neck:     Comments: Slightly oriented to the left Cardiovascular:     Rate and Rhythm: Regular rhythm. Bradycardia present.     Heart sounds: No murmur heard. Pulmonary:     Effort: Pulmonary effort is normal. No respiratory distress.     Breath sounds: Normal breath sounds. No wheezing or rales.  Chest:     Chest wall: No tenderness.  Abdominal:     Palpations: Abdomen is soft.     Tenderness: There is no abdominal tenderness. There is no right CVA tenderness or left CVA tenderness.  Genitourinary:    Comments: Chronic indwelling Foley catheter in place  Musculoskeletal:     Cervical back: Neck supple. No tenderness.     Right lower leg: No edema.     Left lower leg: No edema.     Comments: Limited range of motion globally due to chronic weakness  Skin:    General: Skin is warm and dry.     Coloration: Skin is not jaundiced or pale.  Neurological:     Mental Status: He is alert. Mental status is at baseline.     Comments: Patient has left-sided facial droop which he says is chronic.  Left-sided drooling is present which she states is chronic as well.  He has global weakness which she says is baseline for him.  Psychiatric:        Mood and Affect: Mood normal.        Behavior: Behavior normal.    ED Results / Procedures / Treatments   Labs (all labs ordered are listed, but only abnormal results are displayed) Labs Reviewed  BASIC METABOLIC PANEL - Abnormal; Notable for the following components:      Result Value   BUN 31 (*)    All other components within normal limits  CBC - Abnormal; Notable for the following components:   RBC 3.93 (*)    Hemoglobin 12.6 (*)    All other components within normal limits  URINALYSIS, ROUTINE W REFLEX MICROSCOPIC - Abnormal; Notable for the following components:   APPearance CLOUDY (*)    Hgb urine dipstick SMALL (*)    Protein, ur 100 (*)    Nitrite POSITIVE  (*)    Leukocytes,Ua LARGE (*)    RBC / HPF >50 (*)    WBC, UA >50 (*)    Bacteria, UA MANY (*)    All other components within normal limits  HEPATIC FUNCTION PANEL - Abnormal; Notable for the following components:   Albumin 3.3 (*)    All other components within normal limits  RESP PANEL BY RT-PCR (FLU A&B, COVID) ARPGX2  URINE CULTURE  MAGNESIUM  CBG MONITORING, ED  TROPONIN I (HIGH SENSITIVITY)  TROPONIN I (HIGH SENSITIVITY)    EKG None  Radiology CT HEAD WO CONTRAST  Result Date: 09/17/2021 CLINICAL DATA:  Neuro deficit, acute, stroke suspected EXAM: CT HEAD WITHOUT CONTRAST TECHNIQUE: Contiguous axial images were obtained from the base of the skull through the vertex without intravenous contrast. COMPARISON:  01/17/2021 FINDINGS: Brain: There is no acute intracranial hemorrhage, mass effect, or edema. Gray-white differentiation is preserved. There is no extra-axial fluid collection. Stable prominence of the ventricles and sulci reflecting parenchymal volume loss. Unchanged probable small pontine infarct. Stable patchy low-density in the supratentorial white matter probably reflecting chronic microvascular ischemic changes. Vascular: No hyperdense vessel. There is intracranial atherosclerotic calcification at the skull base. Focal calcification is again identified within the right sylvian fissure likely along a MCA branch. Skull: Calvarium is unremarkable. Sinuses/Orbits: No acute finding. Other: None. IMPRESSION: No acute intracranial abnormality. Electronically Signed   By: Macy Mis M.D.   On: 09/17/2021 16:52   DG Chest Port 1 View  Result Date: 09/17/2021 CLINICAL DATA:  Syncope EXAM: PORTABLE CHEST 1 VIEW COMPARISON:  February 10, 2021 FINDINGS: Rotated. No new consolidation or edema. No pleural effusion. Stable cardiomediastinal contours. Dextrocurvature thoracic spine. IMPRESSION: No acute process in the chest. Electronically Signed   By: Macy Mis M.D.   On: 09/17/2021  16:46    Procedures Procedures   Medications Ordered in ED Medications  cefTRIAXone (ROCEPHIN) 1 g in sodium  chloride 0.9 % 100 mL IVPB (0 g Intravenous Stopped 09/17/21 2027)    ED Course  I have reviewed the triage vital signs and the nursing notes.  Pertinent labs & imaging results that were available during my care of the patient were reviewed by me and considered in my medical decision making (see chart for details).    MDM Rules/Calculators/A&P                         Patient presents after a witnessed syncopal episode at his nursing facility.  At the time, he did not suffer a fall.  He was seated in a wheelchair at the time.  At baseline, patient has very little mobility.  On arrival in the ED, vital signs are notable for heart rate in the 50s.  On exam, he is chronically ill-appearing.  He has a left-sided facial droop and his neck orients to the left.  He says this is normal for him.  Drool is noted in his beard which he says is baseline for him.  He denies any current areas of discomfort.  He denies any current physical symptoms.  He does state that he remembers syncopal episode and states that he felt dizzy at the time.  EMS reported bradycardia and hypotension which is resolved during transit. I Attempted to call patient's skilled nursing facility, [facility].  I was transferred to staff that knows the patient but no answer of phone call was able to be obtained. EKG shows sinus bradycardia.  Patient is on a 12.5 mg twice daily dose of metoprolol.  On chart review, patient appears to typically have a heart rate in the 60s or 70s.  In the ED, patient was kept on bedside cardiac monitor.  Heart rate remained in the 50s and even got as low as the 40s.  He did maintain a sinus rhythm throughout these times of bradycardia.  Lab work was notable for UTI.  Urine sample was obtained after new Foley was placed.  Patient states that, prior to today, Foley was last switched out 4  weeks ago.  On chart review, it seems that he had his Foley placed during a hospitalization earlier in the year.  I was not able to see any urology follow-up appointments.  Patient would likely benefit from urology follow-up to assess for the need to maintain a chronic indwelling Foley catheter.  Patient was informed of his low heart rate.  I asked the patient if he would want placement of a pacemaker if heart doctors were to recommend it.  He stated that he would not want that.  Patient stated that he does not want invasive procedures done to him.  I spoke with cardiology regarding his bradycardia in the setting of a recent syncopal episode.  Per their recommendation, patient to discontinue metoprolol and to follow-up with cardiology.  Patient was given ceftriaxone in the ED and prescribed continue antibiotics for treatment of UTI.  He was advised to follow-up with both cardiology and urology.  Return precautions were given.  Patient was discharged in stable condition.   Final Clinical Impression(s) / ED Diagnoses Final diagnoses:  Syncope, unspecified syncope type    Rx / DC Orders ED Discharge Orders          Ordered    cephALEXin (KEFLEX) 500 MG capsule  4 times daily        09/17/21 2102  Godfrey Pick, MD 09/18/21 1250

## 2021-09-17 NOTE — ED Notes (Signed)
Handoff attempted, but no one from the facility picked up the phone.

## 2021-09-17 NOTE — ED Triage Notes (Signed)
BIB EMS from [facility], pt was in a wheelchair in the lobby, slumped over and went unresponsive. Rolled him back to room and placed in bed, pt woke up. Initially brady w/ EMS in the 40s and hypotensive, now BP in 110s. Denies SOB, CP. Alert and oriented. Does not remember event.   20 G L Wrist

## 2021-09-18 LAB — URINE CULTURE

## 2021-09-27 ENCOUNTER — Non-Acute Institutional Stay: Payer: Medicare Other | Admitting: Hospice

## 2021-09-27 ENCOUNTER — Other Ambulatory Visit: Payer: Self-pay

## 2021-09-27 DIAGNOSIS — N390 Urinary tract infection, site not specified: Secondary | ICD-10-CM

## 2021-09-27 DIAGNOSIS — M170 Bilateral primary osteoarthritis of knee: Secondary | ICD-10-CM

## 2021-09-27 DIAGNOSIS — R55 Syncope and collapse: Secondary | ICD-10-CM

## 2021-09-27 DIAGNOSIS — Z515 Encounter for palliative care: Secondary | ICD-10-CM

## 2021-09-27 NOTE — Progress Notes (Signed)
[facility] Consult Note Telephone: [phone]  Fax: [phone]  PATIENT NAME: Clinton [name] DOB: [birth year] MRN: [id number]  PRIMARY CARE PROVIDER:   [name], NP [name], NP [place],  [place] [address]  REFERRING PROVIDER: [name], NP [name], NP [place],  [place] [address]  RESPONSIBLE PARTY:   Contact Information     Name Relation Home Work Mobile   Arshia, Rondon   [phone]   Nadeem, Romanoski [phone]         Visit is to build trust and highlight Palliative Medicine as specialized medical care for people living with serious illness, aimed at facilitating better quality of life through symptoms relief, assisting with advance care planning and complex medical decision making. This is a follow up visit.  RECOMMENDATIONS/PLAN:   Advance Care Planning/Code Status:  Goals of Care: Goals of care include to maximize quality of life and symptom management.  Visit consisted of counseling and education dealing with the complex and emotionally intense issues of symptom management and palliative care in the setting of serious and potentially life-threatening illness. Palliative care team will continue to support patient, patient's family, and medical team.  Symptom management/Plan:  Syncope: Seen in the ED 09/17/2021 for syncope.  No syncope since then.  He denies dizziness.  Monitor closely. UTI: Treated with cephalexin.  Ensure adequate hydration. Bilateral knee pain: Managed with as needed Tylenol and tramadol.  Effective Follow up: Palliative care will continue to follow for complex medical decision making, advance care planning, and clarification of goals. Return 6 weeks or prn. Encouraged to call provider sooner with any concerns.  CHIEF COMPLAINT: Palliative follow up  HISTORY OF PRESENT ILLNESS:  Clinton [name] a 80 y.o. male with multiple  medical problems including Bil knee osteoarthritis,  HTN, functional quadriplegia, protein caloric malnutrition, coronary artery disease, GERD, Gilbert's syndrome, hypertension, spinal stenosis.  Patient recently seen in the ED for syncope; he denies dizziness or any syncope since then.  He just completed antibiotics for UTI, denies urinary symptoms//pain/discomfort.  History obtained from review of EMR, discussion with primary team, family and/or patient. Records reviewed and summarized above. All 10 point systems reviewed and are negative except as documented in history of present illness above  Review and summarization of Epic records shows history from other than patient.   Palliative Care was asked to follow this patient o help address complex decision making in the context of advance care planning and goals of care clarification.   PHYSICAL EXAM  General: In no acute distress, appropriately dressed Cardiovascular: regular rate and rhythm; no edema in BLE Pulmonary: no cough, no increased work of breathing, normal respiratory effort Abdomen: soft, non tender, no guarding, positive bowel sounds in all quadrants GU:  no suprapubic tenderness, Foley cath in place Eyes: Normal lids, no discharge ENMT: Moist mucous membranes Musculoskeletal:  weakness, sarcopenia Skin: no rash to visible skin, warm without cyanosis,  Psych: non-anxious affect Neurological: Weakness but otherwise non focal Heme/lymph/immuno: no bruises, no bleeding  PERTINENT MEDICATIONS:  Outpatient Encounter Medications as of 09/27/2021  Medication Sig   aspirin 81 MG chewable tablet Chew 1 tablet (81 mg total) by mouth daily.   calcium carbonate (TUMS EX) 750 MG chewable tablet Chew 1 tablet (750 mg total) by mouth 3 (three) times daily. (Patient taking differently: Chew 1 tablet by mouth 3 (three) times daily as needed for heartburn (acid reflux).)   cephALEXin (KEFLEX)  500 MG capsule Take 1 capsule (500 mg total) by  mouth 4 (four) times daily.   feeding supplement (ENSURE ENLIVE / ENSURE PLUS) LIQD Take 237 mLs by mouth 3 (three) times daily between meals.   Multiple Vitamin (MULTIVITAMIN WITH MINERALS) TABS tablet Take 1 tablet by mouth daily.   senna-docusate (SENOKOT-S) 8.6-50 MG tablet Take 1 tablet by mouth at bedtime.   No facility-administered encounter medications on file as of 09/27/2021.    HOSPICE ELIGIBILITY/DIAGNOSIS: TBD  PAST MEDICAL HISTORY:  Past Medical History:  Diagnosis Date   Anxiety    Arthritis    Coronary artery disease    have an appointment with Dr Johnsie Cancel 11/15/14   Erectile dysfunction    Fall 01/17/2021   Frozen shoulder    GERD (gastroesophageal reflux disease)    Guillain Barr syndrome ([place]) 2009   Following the H1 N1 flu vaccine   History of elevated lipids    Hypertension    Osteoarthritis    Spinal stenosis 11/24/2014      ALLERGIES: No Known Allergies    I spent 35 minutes providing this consultation; this includes time spent with patient/family, chart review and documentation. More than 50% of the time in this consultation was spent on counseling and coordinating communication   Thank you for the opportunity to participate in the care of Clinton [name] Please call our office at [phone] if we can be of additional assistance.  Note: Portions of this note were generated with Lobbyist. Dictation errors may occur despite best attempts at proofreading.  Teodoro Spray, NP

## 2021-11-23 ENCOUNTER — Emergency Department (HOSPITAL_COMMUNITY): Payer: Medicare Other

## 2021-11-23 ENCOUNTER — Inpatient Hospital Stay (HOSPITAL_COMMUNITY): Payer: Medicare Other

## 2021-11-23 ENCOUNTER — Non-Acute Institutional Stay: Payer: Medicare Other | Admitting: Hospice

## 2021-11-23 ENCOUNTER — Other Ambulatory Visit: Payer: Self-pay

## 2021-11-23 ENCOUNTER — Inpatient Hospital Stay (HOSPITAL_COMMUNITY)
Admission: EM | Admit: 2021-11-23 | Discharge: 2021-11-30 | DRG: 698 | Disposition: A | Discharge status: Skilled Nursing Facility | Payer: Medicare Other | Attending: Internal Medicine | Admitting: Internal Medicine

## 2021-11-23 DIAGNOSIS — Z87891 Personal history of nicotine dependence: Secondary | ICD-10-CM

## 2021-11-23 DIAGNOSIS — E873 Alkalosis: Secondary | ICD-10-CM | POA: Diagnosis present

## 2021-11-23 DIAGNOSIS — E875 Hyperkalemia: Secondary | ICD-10-CM | POA: Diagnosis present

## 2021-11-23 DIAGNOSIS — A419 Sepsis, unspecified organism: Secondary | ICD-10-CM | POA: Diagnosis present

## 2021-11-23 DIAGNOSIS — T83511A Infection and inflammatory reaction due to indwelling urethral catheter, initial encounter: Principal | ICD-10-CM | POA: Diagnosis present

## 2021-11-23 DIAGNOSIS — F419 Anxiety disorder, unspecified: Secondary | ICD-10-CM | POA: Diagnosis present

## 2021-11-23 DIAGNOSIS — E162 Hypoglycemia, unspecified: Secondary | ICD-10-CM | POA: Diagnosis present

## 2021-11-23 DIAGNOSIS — M7989 Other specified soft tissue disorders: Secondary | ICD-10-CM | POA: Diagnosis not present

## 2021-11-23 DIAGNOSIS — A4159 Other Gram-negative sepsis: Secondary | ICD-10-CM | POA: Diagnosis present

## 2021-11-23 DIAGNOSIS — E785 Hyperlipidemia, unspecified: Secondary | ICD-10-CM | POA: Diagnosis present

## 2021-11-23 DIAGNOSIS — Z20822 Contact with and (suspected) exposure to covid-19: Secondary | ICD-10-CM | POA: Diagnosis present

## 2021-11-23 DIAGNOSIS — Z79899 Other long term (current) drug therapy: Secondary | ICD-10-CM

## 2021-11-23 DIAGNOSIS — L8990 Pressure ulcer of unspecified site, unspecified stage: Secondary | ICD-10-CM | POA: Insufficient documentation

## 2021-11-23 DIAGNOSIS — Z4659 Encounter for fitting and adjustment of other gastrointestinal appliance and device: Secondary | ICD-10-CM

## 2021-11-23 DIAGNOSIS — R6521 Severe sepsis with septic shock: Secondary | ICD-10-CM | POA: Diagnosis present

## 2021-11-23 DIAGNOSIS — R6520 Severe sepsis without septic shock: Secondary | ICD-10-CM | POA: Diagnosis not present

## 2021-11-23 DIAGNOSIS — G9341 Metabolic encephalopathy: Secondary | ICD-10-CM | POA: Diagnosis present

## 2021-11-23 DIAGNOSIS — F0394 Unspecified dementia, unspecified severity, with anxiety: Secondary | ICD-10-CM | POA: Diagnosis present

## 2021-11-23 DIAGNOSIS — M1990 Unspecified osteoarthritis, unspecified site: Secondary | ICD-10-CM | POA: Diagnosis present

## 2021-11-23 DIAGNOSIS — B9689 Other specified bacterial agents as the cause of diseases classified elsewhere: Secondary | ICD-10-CM | POA: Diagnosis present

## 2021-11-23 DIAGNOSIS — N182 Chronic kidney disease, stage 2 (mild): Secondary | ICD-10-CM | POA: Diagnosis present

## 2021-11-23 DIAGNOSIS — Z8249 Family history of ischemic heart disease and other diseases of the circulatory system: Secondary | ICD-10-CM

## 2021-11-23 DIAGNOSIS — N179 Acute kidney failure, unspecified: Secondary | ICD-10-CM | POA: Diagnosis present

## 2021-11-23 DIAGNOSIS — Z6822 Body mass index (BMI) 22.0-22.9, adult: Secondary | ICD-10-CM

## 2021-11-23 DIAGNOSIS — R531 Weakness: Secondary | ICD-10-CM | POA: Diagnosis not present

## 2021-11-23 DIAGNOSIS — I2510 Atherosclerotic heart disease of native coronary artery without angina pectoris: Secondary | ICD-10-CM | POA: Diagnosis present

## 2021-11-23 DIAGNOSIS — Z981 Arthrodesis status: Secondary | ICD-10-CM

## 2021-11-23 DIAGNOSIS — M4800 Spinal stenosis, site unspecified: Secondary | ICD-10-CM | POA: Diagnosis present

## 2021-11-23 DIAGNOSIS — E876 Hypokalemia: Secondary | ICD-10-CM | POA: Diagnosis not present

## 2021-11-23 DIAGNOSIS — I129 Hypertensive chronic kidney disease with stage 1 through stage 4 chronic kidney disease, or unspecified chronic kidney disease: Secondary | ICD-10-CM | POA: Diagnosis present

## 2021-11-23 DIAGNOSIS — E43 Unspecified severe protein-calorie malnutrition: Secondary | ICD-10-CM | POA: Diagnosis present

## 2021-11-23 DIAGNOSIS — K219 Gastro-esophageal reflux disease without esophagitis: Secondary | ICD-10-CM | POA: Diagnosis present

## 2021-11-23 DIAGNOSIS — L8991 Pressure ulcer of unspecified site, stage 1: Secondary | ICD-10-CM | POA: Diagnosis present

## 2021-11-23 DIAGNOSIS — B964 Proteus (mirabilis) (morganii) as the cause of diseases classified elsewhere: Secondary | ICD-10-CM | POA: Diagnosis present

## 2021-11-23 DIAGNOSIS — M1710 Unilateral primary osteoarthritis, unspecified knee: Secondary | ICD-10-CM | POA: Diagnosis present

## 2021-11-23 DIAGNOSIS — R059 Cough, unspecified: Secondary | ICD-10-CM

## 2021-11-23 DIAGNOSIS — J9601 Acute respiratory failure with hypoxia: Secondary | ICD-10-CM | POA: Diagnosis present

## 2021-11-23 DIAGNOSIS — Y846 Urinary catheterization as the cause of abnormal reaction of the patient, or of later complication, without mention of misadventure at the time of the procedure: Secondary | ICD-10-CM | POA: Diagnosis present

## 2021-11-23 DIAGNOSIS — Z7982 Long term (current) use of aspirin: Secondary | ICD-10-CM

## 2021-11-23 DIAGNOSIS — Z515 Encounter for palliative care: Secondary | ICD-10-CM

## 2021-11-23 DIAGNOSIS — Z955 Presence of coronary angioplasty implant and graft: Secondary | ICD-10-CM

## 2021-11-23 LAB — BASIC METABOLIC PANEL
Anion gap: 17 — ABNORMAL HIGH (ref 5–15)
BUN: 79 mg/dL — ABNORMAL HIGH (ref 8–23)
CO2: 18 mmol/L — ABNORMAL LOW (ref 22–32)
Calcium: 8.8 mg/dL — ABNORMAL LOW (ref 8.9–10.3)
Chloride: 105 mmol/L (ref 98–111)
Creatinine, Ser: 3.61 mg/dL — ABNORMAL HIGH (ref 0.61–1.24)
GFR, Estimated: 16 mL/min — ABNORMAL LOW (ref >60)
Glucose, Bld: 61 mg/dL — ABNORMAL LOW (ref 70–99)
Potassium: 4.4 mmol/L (ref 3.5–5.1)
Sodium: 140 mmol/L (ref 135–145)

## 2021-11-23 LAB — URINALYSIS, ROUTINE W REFLEX MICROSCOPIC
Bilirubin Urine: NEGATIVE
Glucose, UA: NEGATIVE mg/dL
Ketones, ur: NEGATIVE mg/dL
Nitrite: POSITIVE — AB
Protein, ur: >300 mg/dL — AB
Specific Gravity, Urine: 1.02 (ref 1.005–1.030)
pH: 8.5 — ABNORMAL HIGH (ref 5.0–8.0)

## 2021-11-23 LAB — URINALYSIS, MICROSCOPIC (REFLEX)
RBC / HPF: >50 RBC/hpf (ref 0–5)
Squamous Epithelial / HPF: NONE SEEN (ref 0–5)
WBC, UA: >50 WBC/hpf (ref 0–5)

## 2021-11-23 LAB — COMPREHENSIVE METABOLIC PANEL
ALT: 14 U/L (ref 0–44)
AST: 34 U/L (ref 15–41)
Albumin: 3.3 g/dL — ABNORMAL LOW (ref 3.5–5.0)
Alkaline Phosphatase: 124 U/L (ref 38–126)
Anion gap: 18 — ABNORMAL HIGH (ref 5–15)
BUN: 82 mg/dL — ABNORMAL HIGH (ref 8–23)
CO2: 14 mmol/L — ABNORMAL LOW (ref 22–32)
Calcium: 9.4 mg/dL (ref 8.9–10.3)
Chloride: 109 mmol/L (ref 98–111)
Creatinine, Ser: 4.24 mg/dL — ABNORMAL HIGH (ref 0.61–1.24)
GFR, Estimated: 14 mL/min — ABNORMAL LOW (ref >60)
Glucose, Bld: 104 mg/dL — ABNORMAL HIGH (ref 70–99)
Potassium: 5.3 mmol/L — ABNORMAL HIGH (ref 3.5–5.1)
Sodium: 141 mmol/L (ref 135–145)
Total Bilirubin: 1 mg/dL (ref 0.3–1.2)
Total Protein: 7.2 g/dL (ref 6.5–8.1)

## 2021-11-23 LAB — I-STAT ARTERIAL BLOOD GAS, ED
Acid-base deficit: 7 mmol/L — ABNORMAL HIGH (ref 0.0–2.0)
Bicarbonate: 18.1 mmol/L — ABNORMAL LOW (ref 20.0–28.0)
Calcium, Ion: 1.23 mmol/L (ref 1.15–1.40)
HCT: 39 % (ref 39.0–52.0)
Hemoglobin: 13.3 g/dL (ref 13.0–17.0)
O2 Saturation: 100 %
Patient temperature: 104
Potassium: 4.2 mmol/L (ref 3.5–5.1)
Sodium: 140 mmol/L (ref 135–145)
TCO2: 19 mmol/L — ABNORMAL LOW (ref 22–32)
pCO2 arterial: 41 mmHg (ref 32.0–48.0)
pH, Arterial: 7.267 — ABNORMAL LOW (ref 7.350–7.450)
pO2, Arterial: 581 mmHg — ABNORMAL HIGH (ref 83.0–108.0)

## 2021-11-23 LAB — CBC WITH DIFFERENTIAL/PLATELET
Abs Immature Granulocytes: 0.04 10*3/uL (ref 0.00–0.07)
Basophils Absolute: 0 10*3/uL (ref 0.0–0.1)
Basophils Relative: 0 %
Eosinophils Absolute: 0 10*3/uL (ref 0.0–0.5)
Eosinophils Relative: 0 %
HCT: 51.2 % (ref 39.0–52.0)
Hemoglobin: 16.6 g/dL (ref 13.0–17.0)
Immature Granulocytes: 0 %
Lymphocytes Relative: 3 %
Lymphs Abs: 0.4 10*3/uL — ABNORMAL LOW (ref 0.7–4.0)
MCH: 31.3 pg (ref 26.0–34.0)
MCHC: 32.4 g/dL (ref 30.0–36.0)
MCV: 96.4 fL (ref 80.0–100.0)
Monocytes Absolute: 0.3 10*3/uL (ref 0.1–1.0)
Monocytes Relative: 2 %
Neutro Abs: 13.1 10*3/uL — ABNORMAL HIGH (ref 1.7–7.7)
Neutrophils Relative %: 95 %
Platelets: 170 10*3/uL (ref 150–400)
RBC: 5.31 MIL/uL (ref 4.22–5.81)
RDW: 14.2 % (ref 11.5–15.5)
WBC: 13.9 10*3/uL — ABNORMAL HIGH (ref 4.0–10.5)
nRBC: 0 % (ref 0.0–0.2)

## 2021-11-23 LAB — CBC
HCT: 41.1 % (ref 39.0–52.0)
Hemoglobin: 13.1 g/dL (ref 13.0–17.0)
MCH: 31.7 pg (ref 26.0–34.0)
MCHC: 31.9 g/dL (ref 30.0–36.0)
MCV: 99.5 fL (ref 80.0–100.0)
Platelets: 127 10*3/uL — ABNORMAL LOW (ref 150–400)
RBC: 4.13 MIL/uL — ABNORMAL LOW (ref 4.22–5.81)
RDW: 14.1 % (ref 11.5–15.5)
WBC: 17 10*3/uL — ABNORMAL HIGH (ref 4.0–10.5)
nRBC: 0 % (ref 0.0–0.2)

## 2021-11-23 LAB — GLUCOSE, CAPILLARY
Glucose-Capillary: 43 mg/dL — CL (ref 70–99)
Glucose-Capillary: 98 mg/dL (ref 70–99)
Glucose-Capillary: 57 mg/dL — ABNORMAL LOW (ref 70–99)

## 2021-11-23 LAB — I-STAT CHEM 8, ED
BUN: 96 mg/dL — ABNORMAL HIGH (ref 8–23)
Calcium, Ion: 1.11 mmol/L — ABNORMAL LOW (ref 1.15–1.40)
Chloride: 113 mmol/L — ABNORMAL HIGH (ref 98–111)
Creatinine, Ser: 3.7 mg/dL — ABNORMAL HIGH (ref 0.61–1.24)
Glucose, Bld: 98 mg/dL (ref 70–99)
HCT: 51 % (ref 39.0–52.0)
Hemoglobin: 17.3 g/dL — ABNORMAL HIGH (ref 13.0–17.0)
Potassium: 5.4 mmol/L — ABNORMAL HIGH (ref 3.5–5.1)
Sodium: 141 mmol/L (ref 135–145)
TCO2: 17 mmol/L — ABNORMAL LOW (ref 22–32)

## 2021-11-23 LAB — RESP PANEL BY RT-PCR (FLU A&B, COVID) ARPGX2
Influenza A by PCR: NEGATIVE
Influenza B by PCR: NEGATIVE
SARS Coronavirus 2 by RT PCR: NEGATIVE

## 2021-11-23 LAB — PROTIME-INR
INR: 1.3 — ABNORMAL HIGH (ref 0.8–1.2)
Prothrombin Time: 16.6 seconds — ABNORMAL HIGH (ref 11.4–15.2)

## 2021-11-23 LAB — I-STAT VENOUS BLOOD GAS, ED
Acid-base deficit: 9 mmol/L — ABNORMAL HIGH (ref 0.0–2.0)
Bicarbonate: 16.1 mmol/L — ABNORMAL LOW (ref 20.0–28.0)
Calcium, Ion: 1.13 mmol/L — ABNORMAL LOW (ref 1.15–1.40)
HCT: 50 % (ref 39.0–52.0)
Hemoglobin: 17 g/dL (ref 13.0–17.0)
O2 Saturation: 91 %
Potassium: 5.4 mmol/L — ABNORMAL HIGH (ref 3.5–5.1)
Sodium: 142 mmol/L (ref 135–145)
TCO2: 17 mmol/L — ABNORMAL LOW (ref 22–32)
pCO2, Ven: 33.1 mmHg — ABNORMAL LOW (ref 44.0–60.0)
pH, Ven: 7.293 (ref 7.250–7.430)
pO2, Ven: 68 mmHg — ABNORMAL HIGH (ref 32.0–45.0)

## 2021-11-23 LAB — LACTIC ACID, PLASMA
Lactic Acid, Venous: 7.4 mmol/L (ref 0.5–1.9)
Lactic Acid, Venous: 6 mmol/L (ref 0.5–1.9)

## 2021-11-23 LAB — APTT: aPTT: 30 seconds (ref 24–36)

## 2021-11-23 MED ORDER — SODIUM BICARBONATE 8.4 % IV SOLN
100.0000 meq | Freq: Once | INTRAVENOUS | Status: AC
  Administered 2021-11-23: 100 meq via INTRAVENOUS
  Filled 2021-11-23: qty 50

## 2021-11-23 MED ORDER — PROPOFOL 1000 MG/100ML IV EMUL
INTRAVENOUS | Status: AC
  Filled 2021-11-23: qty 100

## 2021-11-23 MED ORDER — SODIUM CHLORIDE 0.9 % IV SOLN
2.0000 g | Freq: Once | INTRAVENOUS | Status: AC
  Administered 2021-11-23: 2 g via INTRAVENOUS
  Filled 2021-11-23: qty 2

## 2021-11-23 MED ORDER — FENTANYL 2500MCG IN NS 250ML (10MCG/ML) PREMIX INFUSION
25.0000 ug/h | INTRAVENOUS | Status: DC
  Administered 2021-11-23: 50 ug/h via INTRAVENOUS
  Filled 2021-11-23: qty 250

## 2021-11-23 MED ORDER — LACTATED RINGERS IV BOLUS (SEPSIS)
250.0000 mL | Freq: Once | INTRAVENOUS | Status: AC
  Administered 2021-11-23: 250 mL via INTRAVENOUS

## 2021-11-23 MED ORDER — CHLORHEXIDINE GLUCONATE 0.12% ORAL RINSE (MEDLINE KIT)
15.0000 mL | Freq: Two times a day (BID) | OROMUCOSAL | Status: DC
  Administered 2021-11-24 – 2021-11-25 (×3): 15 mL via OROMUCOSAL

## 2021-11-23 MED ORDER — PROPOFOL 1000 MG/100ML IV EMUL
0.0000 ug/kg/min | INTRAVENOUS | Status: DC
  Administered 2021-11-23: 5 ug/kg/min via INTRAVENOUS
  Filled 2021-11-23: qty 100

## 2021-11-23 MED ORDER — ETOMIDATE 2 MG/ML IV SOLN
INTRAVENOUS | Status: DC | PRN
  Administered 2021-11-23: 20 mg via INTRAVENOUS

## 2021-11-23 MED ORDER — FENTANYL CITRATE PF 50 MCG/ML IJ SOSY: 25.0000 ug | PREFILLED_SYRINGE | INTRAMUSCULAR | Status: DC | PRN

## 2021-11-23 MED ORDER — FENTANYL CITRATE PF 50 MCG/ML IJ SOSY
25.0000 ug | PREFILLED_SYRINGE | INTRAMUSCULAR | Status: DC | PRN
  Administered 2021-11-23: 50 ug via INTRAVENOUS
  Filled 2021-11-23: qty 1

## 2021-11-23 MED ORDER — LACTATED RINGERS IV BOLUS
1000.0000 mL | Freq: Once | INTRAVENOUS | Status: AC
  Administered 2021-11-23: 1000 mL via INTRAVENOUS

## 2021-11-23 MED ORDER — SODIUM CHLORIDE 0.9 % IV SOLN
2.0000 g | INTRAVENOUS | Status: DC
  Administered 2021-11-24: 2 g via INTRAVENOUS
  Filled 2021-11-23: qty 2

## 2021-11-23 MED ORDER — PANTOPRAZOLE SODIUM 40 MG IV SOLR
40.0000 mg | Freq: Every day | INTRAVENOUS | Status: DC
  Administered 2021-11-23 – 2021-11-24 (×2): 40 mg via INTRAVENOUS
  Filled 2021-11-23 (×2): qty 40

## 2021-11-23 MED ORDER — FENTANYL CITRATE PF 50 MCG/ML IJ SOSY
50.0000 ug | PREFILLED_SYRINGE | Freq: Once | INTRAMUSCULAR | Status: AC
  Administered 2021-11-23: 50 ug via INTRAVENOUS
  Filled 2021-11-23: qty 1

## 2021-11-23 MED ORDER — DEXTROSE 50 % IV SOLN: 12.5000 g | INTRAVENOUS | Status: AC

## 2021-11-23 MED ORDER — DEXTROSE IN LACTATED RINGERS 5 % IV SOLN: INTRAVENOUS | Status: DC

## 2021-11-23 MED ORDER — NOREPINEPHRINE 4 MG/250ML-% IV SOLN
2.0000 ug/min | INTRAVENOUS | Status: DC
  Administered 2021-11-23: 2 ug/min via INTRAVENOUS
  Administered 2021-11-24: 5 ug/min via INTRAVENOUS
  Filled 2021-11-23 (×2): qty 250

## 2021-11-23 MED ORDER — VANCOMYCIN HCL 1500 MG/300ML IV SOLN
1500.0000 mg | Freq: Once | INTRAVENOUS | Status: AC
  Administered 2021-11-23: 1500 mg via INTRAVENOUS
  Filled 2021-11-23: qty 300

## 2021-11-23 MED ORDER — POLYETHYLENE GLYCOL 3350 17 G PO PACK: 17.0000 g | PACK | Freq: Every day | ORAL | Status: DC

## 2021-11-23 MED ORDER — LACTATED RINGERS IV SOLN: INTRAVENOUS | Status: DC

## 2021-11-23 MED ORDER — ORAL CARE MOUTH RINSE
15.0000 mL | OROMUCOSAL | Status: DC
  Administered 2021-11-23 – 2021-11-25 (×18): 15 mL via OROMUCOSAL

## 2021-11-23 MED ORDER — PROPOFOL 1000 MG/100ML IV EMUL
5.0000 ug/kg/min | INTRAVENOUS | Status: DC
  Filled 2021-11-23: qty 100

## 2021-11-23 MED ORDER — LACTATED RINGERS IV BOLUS (SEPSIS)
1000.0000 mL | Freq: Once | INTRAVENOUS | Status: AC
  Administered 2021-11-23: 1000 mL via INTRAVENOUS

## 2021-11-23 MED ORDER — POLYETHYLENE GLYCOL 3350 17 G PO PACK: 17.0000 g | PACK | Freq: Every day | ORAL | Status: DC | PRN

## 2021-11-23 MED ORDER — LACTATED RINGERS IV SOLN
INTRAVENOUS | Status: DC
Start: 2021-11-24 — End: 2021-11-24

## 2021-11-23 MED ORDER — FENTANYL BOLUS VIA INFUSION
25.0000 ug | INTRAVENOUS | Status: DC | PRN
  Filled 2021-11-23: qty 25

## 2021-11-23 MED ORDER — ROCURONIUM BROMIDE 50 MG/5ML IV SOLN
INTRAVENOUS | Status: DC | PRN
  Administered 2021-11-23: 80 mg via INTRAVENOUS

## 2021-11-23 MED ORDER — ACETAMINOPHEN 160 MG/5ML PO SOLN: 650.0000 mg | ORAL | Status: DC | PRN

## 2021-11-23 MED ORDER — ONDANSETRON HCL 4 MG/2ML IJ SOLN: 4.0000 mg | Freq: Four times a day (QID) | INTRAMUSCULAR | Status: DC | PRN

## 2021-11-23 MED ORDER — DEXTROSE 10 % IV SOLN: INTRAVENOUS | Status: DC

## 2021-11-23 MED ORDER — SODIUM CHLORIDE 0.9 % IV SOLN
250.0000 mL | INTRAVENOUS | Status: DC
  Administered 2021-11-26: 07:00:00 250 mL via INTRAVENOUS

## 2021-11-23 MED ORDER — DEXTROSE 50 % IV SOLN
INTRAVENOUS | Status: AC
  Administered 2021-11-23: 25 g via INTRAVENOUS
  Filled 2021-11-23: qty 50

## 2021-11-23 MED ORDER — FENTANYL CITRATE PF 50 MCG/ML IJ SOSY
PREFILLED_SYRINGE | INTRAMUSCULAR | Status: AC
  Filled 2021-11-23: qty 1

## 2021-11-23 MED ORDER — DEXTROSE 50 % IV SOLN: 25.0000 g | INTRAVENOUS | Status: AC

## 2021-11-23 MED ORDER — DOCUSATE SODIUM 50 MG/5ML PO LIQD
100.0000 mg | Freq: Two times a day (BID) | ORAL | Status: DC
  Administered 2021-11-23 – 2021-11-25 (×4): 100 mg
  Filled 2021-11-23 (×4): qty 10

## 2021-11-23 MED ORDER — DEXMEDETOMIDINE HCL IN NACL 400 MCG/100ML IV SOLN
0.4000 ug/kg/h | INTRAVENOUS | Status: DC
  Administered 2021-11-23: 0.4 ug/kg/h via INTRAVENOUS
  Filled 2021-11-23: qty 100

## 2021-11-23 MED ORDER — DOCUSATE SODIUM 100 MG PO CAPS: 100.0000 mg | ORAL_CAPSULE | Freq: Two times a day (BID) | ORAL | Status: DC | PRN

## 2021-11-23 MED ORDER — METRONIDAZOLE 500 MG/100ML IV SOLN
500.0000 mg | Freq: Once | INTRAVENOUS | Status: AC
  Administered 2021-11-23: 500 mg via INTRAVENOUS
  Filled 2021-11-23: qty 100

## 2021-11-23 MED ORDER — HEPARIN SODIUM (PORCINE) 5000 UNIT/ML IJ SOLN
5000.0000 [IU] | Freq: Three times a day (TID) | INTRAMUSCULAR | Status: DC
  Administered 2021-11-23 – 2021-11-30 (×20): 5000 [IU] via SUBCUTANEOUS
  Filled 2021-11-23 (×20): qty 1

## 2021-11-23 MED ORDER — VANCOMYCIN VARIABLE DOSE PER UNSTABLE RENAL FUNCTION (PHARMACIST DOSING): Status: DC

## 2021-11-23 MED ORDER — DEXTROSE 50 % IV SOLN
INTRAVENOUS | Status: AC
  Administered 2021-11-23: 12.5 g via INTRAVENOUS
  Filled 2021-11-23: qty 50

## 2021-11-23 NOTE — ED Triage Notes (Addendum)
Pt came from [facility] via ems due to ams. Pt was found unconscious and cyanotic. Pt was being bagged upon arrival to ED.

## 2021-11-23 NOTE — Progress Notes (Signed)
Pharmacy Antibiotic Note  Clinton [name] is a 80 y.o. male admitted on 11/23/2021 with sepsis.  Pharmacy has been consulted for cefepime and vancomycin dosing. Patient presenting with AMS and being bagged by EMS on arrival. Patient intubated in the ED.  Patient's serum creatinine is 3.7 which is significantly above baseline. WBC pending; LA pending; T 104 F  Plan: Flagyl per MD Cefepime 2g q24hr Vancomycin 1500 mg once, subsequent dosing as indicated per random vancomycin level until renal function stable and/or improved, at which time scheduled dosing can be considered Trend WBC, fever, renal function, and clinical course Follow-up cultures and de-escalate antibiotics as appropriate.  Height: 5\' 10"  (177.8 cm) IBW/kg (Calculated) : 73  Temp (24hrs), Avg:104.2 F (40.1 C), Min:104.2 F (40.1 C), Max:104.2 F (40.1 C)  Recent Labs  Lab 11/23/21 1538  CREATININE 3.70*    CrCl cannot be calculated (Unknown ideal weight.).    No Known Allergies  Antimicrobials this admission: metronidazole 12/16 >>  vancomycin 12/16 >>  cefepime 12/16 >>   Microbiology results: Pending  Thank you for allowing pharmacy to be a part of this patients care.  Lorelei Pont, PharmD, BCPS 11/23/2021 3:53 PM ED Clinical Pharmacist -  [phone]

## 2021-11-23 NOTE — Progress Notes (Signed)
Patient transported from ED Room 22 to 7L89 with no complications noted.

## 2021-11-23 NOTE — Progress Notes (Signed)
[facility] Consult Note Telephone: [phone]  Fax: [phone]  PATIENT NAME: Clinton [name] DOB: Jan 09, 1941 MRN: [id number]  PRIMARY CARE PROVIDER:   [name], NP [name], NP [place],  [place] [address]  REFERRING PROVIDER: Martinique Miller, NP  RESPONSIBLE PARTY:  Self Contact Information     Name Relation Home Work Mobile   Akaash, Vandewater   [phone]   Seydina, Holliman [phone]         Visit is to build trust and highlight Palliative Medicine as specialized medical care for people living with serious illness, aimed at facilitating better quality of life through symptoms relief, assisting with advance care planning and complex medical decision making. This is a follow up visit.  RECOMMENDATIONS/PLAN:   Advance Care Planning/Code Status: Patient is a DO NOT RESUSCITATE  Goals of Care: Goals of care include to maximize quality of life and symptom management.  Palliative care team will continue to support patient, patient's family, and medical team.  Symptom management/Plan:  Cough: Chest x-ray, CBC BMP UA and CNS were ordered; treat with appropriate antibiotic if indicated.  Continue Mucinex and cetirizine as ordered. Syncope: Seen in the ED 09/17/2021 for syncope.  No syncope since then.  Bilateral knee pain: Managed with as needed Tylenol and tramadol.  Effective Follow up: Palliative care will continue to follow for complex medical decision making, advance care planning, and clarification of goals. Return 6 weeks or prn. Encouraged to call provider sooner with any concerns.  CHIEF COMPLAINT: Palliative follow up  HISTORY OF PRESENT ILLNESS:  Clinton [name] a 80 y.o. male with multiple medical problems including ongoing cough and general malaise, afebrile,but sleeping more than before, increased weakness. History of Bil knee osteoarthritis,  HTN, functional quadriplegia,  protein caloric malnutrition, coronary artery disease, GERD, Gilbert's syndrome, hypertension, spinal stenosis, syncope.  History obtained from review of EMR, discussion with primary team, family and/or patient. Records reviewed and summarized above. All 10 point systems reviewed and are negative except as documented in history of present illness above  Review and summarization of Epic records shows history from other than patient.   Palliative Care was asked to follow this patient o help address complex decision making in the context of advance care planning and goals of care clarification.   PHYSICAL EXAM  General: lethargic appropriately dressed Cardiovascular: regular rate and rhythm; no edema in BLE Pulmonary:cough, no increased work of breathing, scattered rhonchi Abdomen: soft, non tender, no guarding, positive bowel sounds in all quadrants Eyes: Normal lids, no discharge ENMT: Moist mucous membranes Musculoskeletal:  weakness, sarcopenia Skin: no rash to visible skin, warm without cyanosis,  Psych: non-anxious affect Neurological: Weakness but otherwise non focal Heme/lymph/immuno: no bruises, no bleeding  PERTINENT MEDICATIONS:  Outpatient Encounter Medications as of 11/23/2021  Medication Sig   aspirin 81 MG chewable tablet Chew 1 tablet (81 mg total) by mouth daily.   calcium carbonate (TUMS EX) 750 MG chewable tablet Chew 1 tablet (750 mg total) by mouth 3 (three) times daily. (Patient taking differently: Chew 1 tablet by mouth 3 (three) times daily as needed for heartburn (acid reflux).)   cephALEXin (KEFLEX) 500 MG capsule Take 1 capsule (500 mg total) by mouth 4 (four) times daily.   feeding supplement (ENSURE ENLIVE / ENSURE PLUS) LIQD Take 237 mLs by mouth 3 (three) times daily between meals.   Multiple Vitamin (MULTIVITAMIN WITH MINERALS) TABS tablet Take 1 tablet by mouth daily.  senna-docusate (SENOKOT-S) 8.6-50 MG tablet Take 1 tablet by mouth at bedtime.   No  facility-administered encounter medications on file as of 11/23/2021.    HOSPICE ELIGIBILITY/DIAGNOSIS: TBD  PAST MEDICAL HISTORY:  Past Medical History:  Diagnosis Date   Anxiety    Arthritis    Coronary artery disease    have an appointment with Dr Johnsie Cancel 11/15/14   Erectile dysfunction    Fall 01/17/2021   Frozen shoulder    GERD (gastroesophageal reflux disease)    Guillain Barr syndrome ([place]) 2009   Following the H1 N1 flu vaccine   History of elevated lipids    Hypertension    Osteoarthritis    Spinal stenosis 11/24/2014      ALLERGIES: No Known Allergies    I spent 50 minutes providing this consultation; this includes time spent with patient/family, chart review and documentation. More than 50% of the time in this consultation was spent on counseling and coordinating communication   Thank you for the opportunity to participate in the care of Clinton [name] Please call our office at [phone] if we can be of additional assistance.  Note: Portions of this note were generated with Lobbyist. Dictation errors may occur despite best attempts at proofreading.  Teodoro Spray, NP

## 2021-11-23 NOTE — Progress Notes (Addendum)
eLink Physician-Brief Progress Note Patient Name: Clinton [name] DOB: 10/31/1941 MRN: [id number]   Date of Service  11/23/2021  HPI/Events of Note  Pt noted to be hypoglycemic Pt hypotensive  eICU Interventions  Changed from LR to D5LR Changed from propofol to precedex; and will continue fent gtt Started periph levophed      Intervention Category Evaluation Type: New Patient Evaluation  Tilden Dome 11/23/2021, 8:55 PM

## 2021-11-23 NOTE — ED Notes (Signed)
Lenor Derrick, MD notified about starting antibiotics without cultures. MD stated she still wanted cultures after antibiotics.

## 2021-11-23 NOTE — ED Notes (Signed)
RN paused propofol due to BP. Last BP was 95/64

## 2021-11-23 NOTE — Progress Notes (Signed)
Called son Wille Glaser-- left message Called son Petet- left message.  Julian Hy, DO 11/23/21 5:45 PM [facility]

## 2021-11-23 NOTE — ED Notes (Signed)
RN updated [facility], where pt is from.

## 2021-11-23 NOTE — H&P (Addendum)
NAME:  Clinton [name], MRN:  [id number], DOB:  [birth year], LOS: 0 ADMISSION DATE:  11/23/2021, CONSULTATION DATE:  12/16 REFERRING MD:  Barry Dienes, CHIEF COMPLAINT:  respiratory failure   History of Present Illness:  Clinton [name] is a 80 y/o gentleman with a history of CAD, GERD, HTN who presented from [facility] with altered mental status and fever. When EMS found him he was nonresponsive and cyanotic. They BVM him until he arrived at the ED where he was intubated. He has a DNR form at bedside that limits his resuscitation only by not wanting chest compressions, but he was ok with other forms of support. He was seen about 2 hours prior to arrival by his outpatient Palliative Care team who noted symptoms of weakness, sleeping more, cough, malaise. He had labs ordered at that time. No family at bedside to provide additional history. Per ER nurse he has a chronic indwelling foley, but this has been changed in the ED.  He was started on flagyl, vanc, cefepime in the ED, but unfortunately blood cultures were not drawn prior to antibiotic administration.  Pertinent  Medical History  GERD HTN CAD Knee OA Spinal stenosis Syncope-- ED visit 09/2021  Significant Hospital Events: Including procedures, antibiotic start and stop dates in addition to other pertinent events   12/16 admission, intubated  Interim History / Subjective:    Objective   Blood pressure 95/64, pulse 99, temperature (!) 104.2 F (40.1 C), temperature source Rectal, resp. rate 16, height 5\' 10"  (1.778 m), SpO2 100 %.    Vent Mode: PRVC FiO2 (%):  [40 %-100 %] 40 % Set Rate:  [18 bmp] 18 bmp Vt Set:  [580 mL] 580 mL PEEP:  [5 cmH20] 5 cmH20 Plateau Pressure:  [16 cmH20] 16 cmH20   Intake/Output Summary (Last 24 hours) at 11/23/2021 1802 Last data filed at 11/23/2021 1718 Gross per 24 hour  Intake 2198.51 ml  Output --  Net 2198.51 ml   There were no vitals filed for this visit.  Examination: General:  Critically ill appearing man lying in bed in NAD HENT: [place]/AT, eyes anicteric, ETT in place. Dried bilious secretions on his face. Lungs: CTAB, minimal clear secretions from ETT Cardiovascular: S1S2, RRR Abdomen: soft, NT Extremities: minimal muscle mass, no cyanosis or clubbing Neuro: RASS -5 (sedation off about 10 min), starting to move mouth spontaneously, weak but present tracheal cough. No withdrawal from pain in extremities.  GU: foley with thick purulent urine (new foley), normal external genitalia Derm: pallor, feet and toe cyanosis, no diffuse mottling  LA 7.4 WBC 13.9 UA large  # leukocytes, nitrite positive  CT head personally reviewed> chronic microvascular changes, no acute changes.   CXR personally reviewed> ETT 4cm above carina, bibasilar atelectasis, no infiltrates  covid, flu negative  Resolved Hospital Problem list     Assessment & Plan:  Septic shock due to UTI with AKI, encephalopathy, severe lactic acidosis -foley changed at admission and urine culture sent from new foley> follow until finalized -fluid bolus, giving additional volume -con't cefepime and vanc; stop flagyl -renal US to assess for hydronephrosis -maintain MAP >65; pressors as needed -- per his advanced directives this is what he would have wanted  Acute metabolic encephalopathy due to sepsis -supportive care -avoid deliriogenic meds as able  Lactic acidosis due to sepsis -more fluids -recheck within 3 hrs -maintain MAP >65  Acute respiratory failure with hypoxia due to sepsis -LTVV, 4-8cc/kg IBW with goal Pplat<30 and DP<15 -rate increased to  accommodate for metabolic acidosis; repeat ABG in AM -PAD protocol, fentanyl PRN for sedation -VAP prevention protocol  Hyperkalemia due to AKI Acute metabolic acidosis AKI due to sepsis Chronic foley-- unclear reasoning -renal US -renally dose meds, avoid nephrotoxic meds -strict I/Os -con't foley -repeat BMP this evening to assess for  improvement in hyperkalemia with acidosis treatment.  HTN -hold antihypertensives; home meds unknown. Pharmacy looking for med list from NH.  Sons not available by phone, but messages have been left to let them know their father is in the hospital.   Best Practice (right click and "Reselect all SmartList Selections" daily)   Diet/type: NPO DVT prophylaxis: prophylactic heparin  GI prophylaxis: PPI Lines: N/A Foley:  Yes, and it is still needed Code Status:  limited Last date of multidisciplinary goals of care discussion [ unable to contact family 12/16]  Labs   CBC: Recent Labs  Lab 11/23/21 1530 11/23/21 1538 11/23/21 1638  WBC 13.9*  --   --   NEUTROABS 13.1*  --   --   HGB 16.6 17.0   17.3* 13.3  HCT 51.2 50.0   51.0 39.0  MCV 96.4  --   --   PLT 170  --   --     Basic Metabolic Panel: Recent Labs  Lab 11/23/21 1530 11/23/21 1538 11/23/21 1638  NA 141 142   141 140  K 5.3* 5.4*   5.4* 4.2  CL 109 113*  --   CO2 14*  --   --   GLUCOSE 104* 98  --   BUN 82* 96*  --   CREATININE 4.24* 3.70*  --   CALCIUM 9.4  --   --    GFR: CrCl cannot be calculated (Unknown ideal weight.). Recent Labs  Lab 11/23/21 1530  WBC 13.9*  LATICACIDVEN 7.4*    Liver Function Tests: Recent Labs  Lab 11/23/21 1530  AST 34  ALT 14  ALKPHOS 124  BILITOT 1.0  PROT 7.2  ALBUMIN 3.3*   No results for input(s): LIPASE, AMYLASE in the last 168 hours. No results for input(s): AMMONIA in the last 168 hours.  ABG    Component Value Date/Time   PHART 7.267 (L) 11/23/2021 1638   PCO2ART 41.0 11/23/2021 1638   PO2ART 581 (H) 11/23/2021 1638   HCO3 18.1 (L) 11/23/2021 1638   TCO2 19 (L) 11/23/2021 1638   ACIDBASEDEF 7.0 (H) 11/23/2021 1638   O2SAT 100.0 11/23/2021 1638     Coagulation Profile: Recent Labs  Lab 11/23/21 1530  INR 1.3*    Cardiac Enzymes: No results for input(s): CKTOTAL, CKMB, CKMBINDEX, TROPONINI in the last 168 hours.  HbA1C: Hgb A1c MFr Bld   Date/Time Value Ref Range Status  04/16/2018 02:49 PM 5.6 4.6 - 6.5 % Final    Comment:    Glycemic Control Guidelines for People with Diabetes:Non Diabetic:  <6%Goal of Therapy: <7%Additional Action Suggested:  >8%   10/12/2015 02:04 PM 5.4 4.6 - 6.5 % Final    Comment:    Glycemic Control Guidelines for People with Diabetes:Non Diabetic:  <6%Goal of Therapy: <7%Additional Action Suggested:  >8%     CBG: No results for input(s): GLUCAP in the last 168 hours.  Review of Systems:   Unable to be obtained due to intubated, sedated.  Past Medical History:  He,  has a past medical history of Anxiety, Arthritis, Coronary artery disease, Erectile dysfunction, Fall (01/17/2021), Frozen shoulder, GERD (gastroesophageal reflux disease), Guillain Barr syndrome ([place]) (2009), History of  elevated lipids, Hypertension, Osteoarthritis, and Spinal stenosis (11/24/2014).   Surgical History:   Past Surgical History:  Procedure Laterality Date   CARDIAC SURGERY     2 stents placed    CORONARY ANGIOPLASTY     mid and proximal LAD stent 09/1999 [facility])   posterior lumbar fusion L4-5  11-24-14     Social History:   reports that he quit smoking about 47 years ago. His smoking use included cigarettes. He has a 5.00 pack-year smoking history. He has never used smokeless tobacco. He reports current alcohol use. He reports that he does not use drugs.   Family History:  His family history includes Heart Problems in his father; Heart disease in his father; Sudden death in his father.   Allergies No Known Allergies   Home Medications  Prior to Admission medications   Medication Sig Start Date End Date Taking? Authorizing Provider  aspirin 81 MG chewable tablet Chew 1 tablet (81 mg total) by mouth daily. 02/06/21   Medina-Vargas, Monina C, NP  calcium carbonate (TUMS EX) 750 MG chewable tablet Chew 1 tablet (750 mg total) by mouth 3 (three) times daily. Patient taking differently: Chew 1  tablet by mouth 3 (three) times daily as needed for heartburn (acid reflux). 02/06/21   Medina-Vargas, Monina C, NP  cephALEXin (KEFLEX) 500 MG capsule Take 1 capsule (500 mg total) by mouth 4 (four) times daily. 09/17/21   Godfrey Pick, MD  feeding supplement (ENSURE ENLIVE / ENSURE PLUS) LIQD Take 237 mLs by mouth 3 (three) times daily between meals. 02/21/21   Florencia Reasons, MD  Multiple Vitamin (MULTIVITAMIN WITH MINERALS) TABS tablet Take 1 tablet by mouth daily. 02/22/21   Florencia Reasons, MD  senna-docusate (SENOKOT-S) 8.6-50 MG tablet Take 1 tablet by mouth at bedtime. 02/21/21   Florencia Reasons, MD     Critical care time: [address] min     Julian Hy, DO 11/23/21 6:29 PM [facility]

## 2021-11-23 NOTE — Progress Notes (Signed)
An USGPIV (ultrasound guided PIV) has been placed for short-term vasopressor infusion. A correctly placed ivWatch must be used when administering Vasopressors. Should this treatment be needed beyond 72 hours, central line access should be obtained.  It will be the responsibility of the bedside nurse to follow best practice to prevent extravasations.   ?

## 2021-11-23 NOTE — ED Provider Notes (Signed)
[facility] EMERGENCY DEPARTMENT Provider Note   CSN: [id number] Arrival date & time: 11/23/21  1512     History Chief Complaint  Patient presents with   Altered Mental Status    Clinton [name] is a 80 y.o. male.  The history is provided by medical records and the EMS personnel.  Altered Mental Status Presenting symptoms: unresponsiveness   Severity:  Severe Most recent episode:  Today Episode history:  Single Duration: Last know well noon today (3 hours prior to arrival) Timing:  Constant Progression:  Unchanged Chronicity:  New Context: dementia and nursing home resident   Associated symptoms: fever       Past Medical History:  Diagnosis Date   Anxiety    Arthritis    Coronary artery disease    have an appointment with Dr Johnsie Cancel 11/15/14   Erectile dysfunction    Fall 01/17/2021   Frozen shoulder    GERD (gastroesophageal reflux disease)    Guillain Barr syndrome ([place]) 2009   Following the H1 N1 flu vaccine   History of elevated lipids    Hypertension    Osteoarthritis    Spinal stenosis 11/24/2014    Patient Active Problem List   Diagnosis Date Noted   Severe sepsis with acute organ dysfunction ([place]) 11/23/2021   Protein-calorie malnutrition, severe 02/12/2021   Bladder mass    Acute lower UTI 02/10/2021   Hematemesis 02/10/2021   Thrombocytopenia ([place]) 01/25/2021   Acute CHF (congestive heart failure) ([place]) 01/17/2021   Hypertensive urgency 01/17/2021   Bradycardia 01/17/2021   Fall 01/17/2021   Near syncope 01/17/2021   AKI (acute kidney injury) ([place])    Rotator cuff tear arthropathy of both shoulders 01/25/2016   Degenerative arthritis of knee, bilateral 01/25/2016   Status post lumbar spinal fusion 11/29/2014   GERD (gastroesophageal reflux disease) 11/25/2014   Spinal stenosis 11/24/2014   CAD (coronary artery disease) 11/15/2014   HTN (hypertension) 11/15/2014   Hyperlipidemia 11/15/2014    Past Surgical History:   Procedure Laterality Date   CARDIAC SURGERY     2 stents placed    CORONARY ANGIOPLASTY     mid and proximal LAD stent 09/1999 [facility])   posterior lumbar fusion L4-5  11-24-14       Family History  Problem Relation Age of Onset   Heart Problems Father    Heart disease Father    Sudden death Father     Social History   Tobacco Use   Smoking status: Former    Packs/day: 0.50    Years: 10.00    Pack years: 5.00    Types: Cigarettes    Quit date: 11/11/1974    Years since quitting: 47.0   Smokeless tobacco: Never   Tobacco comments:    age 14 yrs  Vaping Use   Vaping Use: Never used  Substance Use Topics   Alcohol use: Yes    Comment: occasionally   Drug use: No    Home Medications Prior to Admission medications   Medication Sig Start Date End Date Taking? Authorizing Provider  aspirin 81 MG chewable tablet Chew 1 tablet (81 mg total) by mouth daily. 02/06/21   Medina-Vargas, Monina C, NP  calcium carbonate (TUMS EX) 750 MG chewable tablet Chew 1 tablet (750 mg total) by mouth 3 (three) times daily. Patient taking differently: Chew 1 tablet by mouth 3 (three) times daily as needed for heartburn (acid reflux). 02/06/21   Medina-Vargas, Monina C, NP  cephALEXin (KEFLEX)  500 MG capsule Take 1 capsule (500 mg total) by mouth 4 (four) times daily. 09/17/21   Godfrey Pick, MD  feeding supplement (ENSURE ENLIVE / ENSURE PLUS) LIQD Take 237 mLs by mouth 3 (three) times daily between meals. 02/21/21   Florencia Reasons, MD  Multiple Vitamin (MULTIVITAMIN WITH MINERALS) TABS tablet Take 1 tablet by mouth daily. 02/22/21   Florencia Reasons, MD  senna-docusate (SENOKOT-S) 8.6-50 MG tablet Take 1 tablet by mouth at bedtime. 02/21/21   Florencia Reasons, MD    Allergies    Patient has no known allergies.  Review of Systems   Review of Systems  Unable to perform ROS: Patient unresponsive  Constitutional:  Positive for fever.   Physical Exam Updated Vital Signs BP 93/68    Pulse 79    Temp 98.4 F  (36.9 C) (Axillary)    Resp (!) 28    Ht _0  (1.778 m)    Wt 71.4 kg    SpO2 100%    BMI 22.59 kg/m   Physical Exam Vitals and nursing note reviewed.  Constitutional:      General: He is in acute distress.     Appearance: He is well-developed. He is ill-appearing.  HENT:     Head: Normocephalic and atraumatic.     Right Ear: External ear normal.     Left Ear: External ear normal.     Nose: Nose normal. No congestion.     Mouth/Throat:     Mouth: Mucous membranes are dry.     Pharynx: Oropharynx is clear. No posterior oropharyngeal erythema.  Eyes:     Extraocular Movements: Extraocular movements intact.     Conjunctiva/sclera: Conjunctivae normal.     Pupils: Pupils are equal, round, and reactive to light.  Cardiovascular:     Rate and Rhythm: Regular rhythm. Tachycardia present.     Pulses: Normal pulses.     Heart sounds: No murmur heard. Pulmonary:     Effort: Respiratory distress present.     Breath sounds: No rhonchi or rales.     Comments: Spontaneous respirations, requiring assisted ventilation with BVM. Abdominal:     General: Abdomen is flat.     Palpations: Abdomen is soft.     Tenderness: There is no abdominal tenderness. There is no guarding or rebound.  Genitourinary:    Comments: Chronic foley cath in place w/ dark sediment in catheter tubing Musculoskeletal:        General: No swelling, tenderness or deformity.     Cervical back: Normal range of motion and neck supple. No rigidity.  Skin:    General: Skin is warm and dry.     Capillary Refill: Capillary refill takes less than 2 seconds.     Comments: Warm and diaphoretic.  Mottled lower extremities. Purple discoloration of bilateral feet.  Faint DP and PT pulses.  Neurological:     Mental Status: He is unresponsive.     GCS: GCS eye subscore is 1. GCS verbal subscore is 1. GCS motor subscore is 1.     Comments: Corneal and gag reflexes intact.    ED Results / Procedures / Treatments   Labs (all labs  ordered are listed, but only abnormal results are displayed) Labs Reviewed  LACTIC ACID, PLASMA - Abnormal; Notable for the following components:      Result Value   Lactic Acid, Venous 7.4 (*)    All other components within normal limits  LACTIC ACID, PLASMA - Abnormal; Notable for the following components:  Lactic Acid, Venous 6.0 (*)    All other components within normal limits  COMPREHENSIVE METABOLIC PANEL - Abnormal; Notable for the following components:   Potassium 5.3 (*)    CO2 14 (*)    Glucose, Bld 104 (*)    BUN 82 (*)    Creatinine, Ser 4.24 (*)    Albumin 3.3 (*)    GFR, Estimated 14 (*)    Anion gap 18 (*)    All other components within normal limits  CBC WITH DIFFERENTIAL/PLATELET - Abnormal; Notable for the following components:   WBC 13.9 (*)    Neutro Abs 13.1 (*)    Lymphs Abs 0.4 (*)    All other components within normal limits  PROTIME-INR - Abnormal; Notable for the following components:   Prothrombin Time 16.6 (*)    INR 1.3 (*)    All other components within normal limits  URINALYSIS, ROUTINE W REFLEX MICROSCOPIC - Abnormal; Notable for the following components:   APPearance HAZY (*)    pH 8.5 (*)    Hgb urine dipstick LARGE (*)    Protein, ur >300 (*)    Nitrite POSITIVE (*)    Leukocytes,Ua LARGE (*)    All other components within normal limits  URINALYSIS, MICROSCOPIC (REFLEX) - Abnormal; Notable for the following components:   Bacteria, UA MANY (*)    All other components within normal limits  BASIC METABOLIC PANEL - Abnormal; Notable for the following components:   CO2 18 (*)    Glucose, Bld 61 (*)    BUN 79 (*)    Creatinine, Ser 3.61 (*)    Calcium 8.8 (*)    GFR, Estimated 16 (*)    Anion gap 17 (*)    All other components within normal limits  CBC - Abnormal; Notable for the following components:   WBC 17.0 (*)    RBC 4.13 (*)    Platelets 127 (*)    All other components within normal limits  GLUCOSE, CAPILLARY - Abnormal;  Notable for the following components:   Glucose-Capillary 43 (*)    All other components within normal limits  GLUCOSE, CAPILLARY - Abnormal; Notable for the following components:   Glucose-Capillary 57 (*)    All other components within normal limits  I-STAT CHEM 8, ED - Abnormal; Notable for the following components:   Potassium 5.4 (*)    Chloride 113 (*)    BUN 96 (*)    Creatinine, Ser 3.70 (*)    Calcium, Ion 1.11 (*)    TCO2 17 (*)    Hemoglobin 17.3 (*)    All other components within normal limits  I-STAT VENOUS BLOOD GAS, ED - Abnormal; Notable for the following components:   pCO2, Ven 33.1 (*)    pO2, Ven 68.0 (*)    Bicarbonate 16.1 (*)    TCO2 17 (*)    Acid-base deficit 9.0 (*)    Potassium 5.4 (*)    Calcium, Ion 1.13 (*)    All other components within normal limits  I-STAT ARTERIAL BLOOD GAS, ED - Abnormal; Notable for the following components:   pH, Arterial 7.267 (*)    pO2, Arterial 581 (*)    Bicarbonate 18.1 (*)    TCO2 19 (*)    Acid-base deficit 7.0 (*)    All other components within normal limits  RESP PANEL BY RT-PCR (FLU A&B, COVID) ARPGX2  CULTURE, BLOOD (ROUTINE X 2)  CULTURE, BLOOD (ROUTINE X 2)  URINE CULTURE  APTT  GLUCOSE, CAPILLARY  BASIC METABOLIC PANEL  CBC  BLOOD GAS, ARTERIAL  TRIGLYCERIDES    EKG None  Radiology CT HEAD WO CONTRAST (5MM)  Result Date: 11/23/2021 CLINICAL DATA:  Mental status change. EXAM: CT HEAD WITHOUT CONTRAST TECHNIQUE: Contiguous axial images were obtained from the base of the skull through the vertex without intravenous contrast. COMPARISON:  CT head dated September 17, 2021. FINDINGS: Brain: No evidence of acute infarction, hemorrhage, hydrocephalus, extra-axial collection or mass lesion/mass effect. Mild cerebral atrophy and chronic microvascular ischemic changes. Hypodensity in the pons, likely chronic infarct, unchanged. Vascular: Prominent atherosclerotic calcification of bilateral vertebral arteries as  well as carotid siphons. Skull: Normal. Negative for fracture or focal lesion. Sinuses/Orbits: No acute finding. Other: None. IMPRESSION: 1.  No acute intracranial abnormality. 2.  Stable chronic findings as above. Electronically Signed   By: Keane Police D.O.   On: 11/23/2021 17:53   US RENAL  Result Date: 11/23/2021 CLINICAL DATA:  Acute renal insufficiency EXAM: RENAL / URINARY TRACT ULTRASOUND COMPLETE COMPARISON:  February 11, 2021 FINDINGS: Right Kidney: Renal measurements: 11.1 x 5.2 x 5.3 cm = volume: 159.3 mL. The right renal pelvis is mildly prominent there is mild dilatation of lower pole calices not seen previously. Left Kidney: Renal measurements: 11.9 x 6.0 x 6.4 cm = volume: 238 mL. Echogenicity within normal limits. No mass or hydronephrosis visualized. Bladder: Decompressed and not well evaluated. Other: None. IMPRESSION: 1. Mild hydronephrosis in renal pelvis prominence on the right, not seen previously. This finding is otherwise age indeterminate. 2. The kidneys are otherwise unremarkable. 3. Decompressed bladder precluding evaluation. Electronically Signed   By: Dorise Bullion III M.D.   On: 11/23/2021 19:06   DG Chest Port 1 View  Result Date: 11/23/2021 CLINICAL DATA:  OG tube placement EXAM: CHEST  1 VIEW PORTABLE COMPARISON:  11/23/2021 FINDINGS: Endotracheal tube tip is about 2.7 cm superior to carina. Esophageal tube tip overlies distal stomach. Small left effusion with basilar airspace disease. Stable cardiomediastinal silhouette IMPRESSION: 1. Endotracheal tube tip about 2.7 cm superior to carina. 2. Esophageal tube tip overlies the distal stomach 3. Small left-sided pleural effusion with airspace disease at the left lung base which may reflect atelectasis or pneumonia. Electronically Signed   By: Donavan Foil M.D.   On: 11/23/2021 22:53   DG Chest Portable 1 View  Result Date: 11/23/2021 CLINICAL DATA:  Altered mental status.  Post intubation. EXAM: PORTABLE CHEST 1 VIEW  COMPARISON:  09/17/2021 FINDINGS: The patient is rotated to the left with grossly unchanged cardiomediastinal silhouette. Aortic atherosclerosis is noted. An endotracheal tube has been placed and terminates 3.5-4 cm above the carina. Lung volumes are low with minimal right and mild left basilar opacities. No sizable pleural effusion or pneumothorax is identified. No acute osseous abnormality is seen. IMPRESSION: Endotracheal tube as above. Low lung volumes with bibasilar atelectasis. Electronically Signed   By: Logan Bores M.D.   On: 11/23/2021 15:47    Procedures Date/Time: 11/23/2021 4:04 PM Performed by: Idamae Lusher, MD Pre-anesthesia Checklist: Patient identified, Emergency Drugs available, Suction available, Patient being monitored and Timeout performed Oxygen Delivery Method: Ambu bag Preoxygenation: Pre-oxygenation with 100% oxygen Induction Type: Rapid sequence Ventilation: Mask ventilation without difficulty Laryngoscope Size: Mac and 3 Grade View: Grade I Number of attempts: 1 Airway Equipment and Method: Rigid stylet Placement Confirmation: ETT inserted through vocal cords under direct vision, Positive ETCO2, CO2 detector and Breath sounds checked- equal and bilateral Secured at: 24 cm Tube secured with: ETT  holder Dental Injury: Teeth and Oropharynx as per pre-operative assessment  Difficulty Due To: Difficulty was anticipated      Medications Ordered in ED Medications  propofol (DIPRIVAN) 1000 MG/100ML infusion (  Not Given 11/23/21 1601)  vancomycin variable dose per unstable renal function (pharmacist dosing) (has no administration in time range)  ceFEPIme (MAXIPIME) 2 g in sodium chloride 0.9 % 100 mL IVPB (has no administration in time range)  polyethylene glycol (MIRALAX / GLYCOLAX) packet 17 g (has no administration in time range)  docusate (COLACE) 50 MG/5ML liquid 100 mg (100 mg Per Tube Given 11/23/21 2119)  heparin injection 5,000 Units (5,000 Units  Subcutaneous Given 11/23/21 2120)  ondansetron (ZOFRAN) injection 4 mg (has no administration in time range)  pantoprazole (PROTONIX) injection 40 mg (40 mg Intravenous Given 11/23/21 2120)  chlorhexidine gluconate (MEDLINE KIT) (PERIDEX) 0.12 % solution 15 mL (0 mLs Mouth Rinse Hold 11/23/21 1941)  MEDLINE mouth rinse (15 mLs Mouth Rinse Given 11/23/21 2337)  fentaNYL (SUBLIMAZE) injection 25 mcg (has no administration in time range)  fentaNYL (SUBLIMAZE) injection 25-100 mcg (50 mcg Intravenous Given 11/23/21 1936)  acetaminophen (TYLENOL) 160 MG/5ML solution 650 mg (has no administration in time range)  dexmedetomidine (PRECEDEX) 400 MCG/100ML (4 mcg/mL) infusion (0.4 mcg/kg/hr  71.4 kg Intravenous New Bag/Given 11/23/21 2159)  0.9 %  sodium chloride infusion (has no administration in time range)  norepinephrine (LEVOPHED) 10m in 2575m(0.016 mg/mL) premix infusion (2 mcg/min Intravenous New Bag/Given 11/23/21 2151)  dextrose 10 % infusion (has no administration in time range)  lactated ringers infusion (has no administration in time range)  fentaNYL (SUBLIMAZE) 50 MCG/ML injection (  Given 11/23/21 1545)  lactated ringers bolus 1,000 mL (0 mLs Intravenous Stopped 11/23/21 1654)    And  lactated ringers bolus 1,000 mL (0 mLs Intravenous Stopped 11/23/21 1718)    And  lactated ringers bolus 250 mL (0 mLs Intravenous Stopped 11/23/21 1811)  ceFEPIme (MAXIPIME) 2 g in sodium chloride 0.9 % 100 mL IVPB (0 g Intravenous Stopped 11/23/21 1604)  metroNIDAZOLE (FLAGYL) IVPB 500 mg (0 mg Intravenous Stopped 11/23/21 1704)  vancomycin (VANCOREADY) IVPB 1500 mg/300 mL (0 mg Intravenous Stopped 11/23/21 1841)  fentaNYL (SUBLIMAZE) injection 50 mcg (50 mcg Intravenous Given 11/23/21 1608)  sodium bicarbonate injection 100 mEq (100 mEq Intravenous Given 11/23/21 1821)  lactated ringers bolus 1,000 mL (1,000 mLs Intravenous New Bag/Given 11/23/21 1842)  dextrose 50 % solution 25 g (25 g Intravenous  Given 11/23/21 2033)  dextrose 50 % solution 12.5 g (12.5 g Intravenous Given 11/23/21 2337)    ED Course  I have reviewed the triage vital signs and the nursing notes.  Pertinent labs & imaging results that were available during my care of the patient were reviewed by me and considered in my medical decision making (see chart for details).    MDM Rules/Calculators/A&P                          [age]ear old male presenting with altered mental status. Febrile and tachycardic on arrival.  Exam as above.  Sepsis protocol initiated.  Cultures pending.  He was given 30 cc/kg fluids.  Started on empiric broad-spectrum antibiotics. COVID/flu negative.  UA concerning for acute infection.  Likely urosepsis.  Lactic acid is elevated to 7.  White count elevated to 18.  ABG consistent with metabolic acidosis with pH of 7.2.  Chest x-ray showed no acute infiltrate.  CT head showed no acute intracranial  findings.  Patient's MOST form reviewed.  Indicates that the patient would be okay with mechanical ventilation, antibiotics, fluids, cardioversion.  He would not be okay with chest compressions if he were to become pulseless.  Discussed with critical care team.  Will admit for further evaluation and management of likely urosepsis.  He remains intubated, but not requiring pressors.  He was transferred out of the ED and guarded condition.    Final Clinical Impression(s) / ED Diagnoses Final diagnoses:  AKI (acute kidney injury) ([place])  Encounter for nasogastric (NG) tube placement    Rx / DC Orders ED Discharge Orders     None        Idamae Lusher, MD 11/24/21 0000    Malvin Johns, MD 11/25/21 [phone]

## 2021-11-24 DIAGNOSIS — L8990 Pressure ulcer of unspecified site, unspecified stage: Secondary | ICD-10-CM | POA: Insufficient documentation

## 2021-11-24 DIAGNOSIS — R6520 Severe sepsis without septic shock: Secondary | ICD-10-CM

## 2021-11-24 LAB — CBC
HCT: 32.1 % — ABNORMAL LOW (ref 39.0–52.0)
Hemoglobin: 11.1 g/dL — ABNORMAL LOW (ref 13.0–17.0)
MCH: 32.2 pg (ref 26.0–34.0)
MCHC: 34.6 g/dL (ref 30.0–36.0)
MCV: 93 fL (ref 80.0–100.0)
Platelets: 110 10*3/uL — ABNORMAL LOW (ref 150–400)
RBC: 3.45 MIL/uL — ABNORMAL LOW (ref 4.22–5.81)
RDW: 14 % (ref 11.5–15.5)
WBC: 14.6 10*3/uL — ABNORMAL HIGH (ref 4.0–10.5)
nRBC: 0 % (ref 0.0–0.2)

## 2021-11-24 LAB — GLUCOSE, CAPILLARY
Glucose-Capillary: 103 mg/dL — ABNORMAL HIGH (ref 70–99)
Glucose-Capillary: 109 mg/dL — ABNORMAL HIGH (ref 70–99)
Glucose-Capillary: 110 mg/dL — ABNORMAL HIGH (ref 70–99)
Glucose-Capillary: 114 mg/dL — ABNORMAL HIGH (ref 70–99)
Glucose-Capillary: 116 mg/dL — ABNORMAL HIGH (ref 70–99)
Glucose-Capillary: 129 mg/dL — ABNORMAL HIGH (ref 70–99)
Glucose-Capillary: 89 mg/dL (ref 70–99)

## 2021-11-24 LAB — BASIC METABOLIC PANEL
Anion gap: 12 (ref 5–15)
BUN: 81 mg/dL — ABNORMAL HIGH (ref 8–23)
CO2: 20 mmol/L — ABNORMAL LOW (ref 22–32)
Calcium: 8.1 mg/dL — ABNORMAL LOW (ref 8.9–10.3)
Chloride: 107 mmol/L (ref 98–111)
Creatinine, Ser: 3.1 mg/dL — ABNORMAL HIGH (ref 0.61–1.24)
GFR, Estimated: 20 mL/min — ABNORMAL LOW (ref >60)
Glucose, Bld: 118 mg/dL — ABNORMAL HIGH (ref 70–99)
Potassium: 3.8 mmol/L (ref 3.5–5.1)
Sodium: 139 mmol/L (ref 135–145)

## 2021-11-24 LAB — POCT I-STAT 7, (LYTES, BLD GAS, ICA,H+H)
Acid-base deficit: 2 mmol/L (ref 0.0–2.0)
Bicarbonate: 18.3 mmol/L — ABNORMAL LOW (ref 20.0–28.0)
Calcium, Ion: 1.16 mmol/L (ref 1.15–1.40)
HCT: 30 % — ABNORMAL LOW (ref 39.0–52.0)
Hemoglobin: 10.2 g/dL — ABNORMAL LOW (ref 13.0–17.0)
O2 Saturation: 99 %
Patient temperature: 98.5
Potassium: 3.6 mmol/L (ref 3.5–5.1)
Sodium: 139 mmol/L (ref 135–145)
TCO2: 19 mmol/L — ABNORMAL LOW (ref 22–32)
pCO2 arterial: 18.3 mmHg — CL (ref 32.0–48.0)
pH, Arterial: 7.607 (ref 7.350–7.450)
pO2, Arterial: 116 mmHg — ABNORMAL HIGH (ref 83.0–108.0)

## 2021-11-24 LAB — MAGNESIUM: Magnesium: 1.7 mg/dL (ref 1.7–2.4)

## 2021-11-24 LAB — TRIGLYCERIDES: Triglycerides: 44 mg/dL (ref <150)

## 2021-11-24 LAB — PHOSPHORUS: Phosphorus: 2.7 mg/dL (ref 2.5–4.6)

## 2021-11-24 LAB — MRSA NEXT GEN BY PCR, NASAL: MRSA by PCR Next Gen: NOT DETECTED

## 2021-11-24 MED ORDER — MAGNESIUM SULFATE 2 GM/50ML IV SOLN
2.0000 g | Freq: Once | INTRAVENOUS | Status: AC
  Administered 2021-11-24: 2 g via INTRAVENOUS
  Filled 2021-11-24: qty 50

## 2021-11-24 MED ORDER — VITAL 1.5 CAL PO LIQD
1000.0000 mL | ORAL | Status: DC
  Administered 2021-11-24: 1000 mL
  Filled 2021-11-24: qty 1000

## 2021-11-24 MED ORDER — VITAL HIGH PROTEIN PO LIQD: 1000.0000 mL | ORAL | Status: DC

## 2021-11-24 MED ORDER — POTASSIUM & SODIUM PHOSPHATES 280-160-250 MG PO PACK
2.0000 | PACK | Freq: Once | ORAL | Status: AC
  Administered 2021-11-24: 2
  Filled 2021-11-24: qty 1

## 2021-11-24 MED ORDER — PROSOURCE TF PO LIQD
45.0000 mL | Freq: Two times a day (BID) | ORAL | Status: DC
  Administered 2021-11-24 – 2021-11-25 (×3): 45 mL
  Filled 2021-11-24 (×3): qty 45

## 2021-11-24 MED ORDER — CHLORHEXIDINE GLUCONATE CLOTH 2 % EX PADS
6.0000 | MEDICATED_PAD | Freq: Every day | CUTANEOUS | Status: DC
  Administered 2021-11-24 – 2021-11-30 (×6): 6 via TOPICAL

## 2021-11-24 NOTE — Progress Notes (Signed)
Initial Nutrition Assessment  DOCUMENTATION CODES:   Not applicable  INTERVENTION:   Tube Feeding via OG:  Starting trickle TF today, Vital 1.5 at 20 ml/hr Goal rate: Vital 1.5 at 55 ml/hr Pro-Source TF 45 mL BID Goal rate provides 2060 kcals, 111 g of protein, 1003 mLL of free water  NUTRITION DIAGNOSIS:   Inadequate oral intake related to acute illness as evidenced by NPO status.  GOAL:   Patient will meet greater than or equal to 90% of their needs  MONITOR:   TF tolerance, Vent status, Weight trends, Labs  REASON FOR ASSESSMENT:   Ventilator, Consult Enteral/tube feeding initiation and management  ASSESSMENT:   80 yo male admitted from [facility] with AMS and fever with septic shock from UTI with AKI, required intubation on admission. PMH includes CAD, GERD, HTN  Pt remains on vent support OG tube in stomach per chest xray  Hypoglycemia requiring D10 infusion  No recent wt loss per wt encounters. Unable to obtain diet and weight history  Labs: reviewed; CBGs 109-129 Meds: D10 at 100 ml/hr, colace   Diet Order:   Diet Order             Diet NPO time specified  Diet effective now                   EDUCATION NEEDS:   Not appropriate for education at this time  Skin:  Skin Assessment: Reviewed RN Assessment  Last BM:  PTA  Height:   Ht Readings from Last 1 Encounters:  11/23/21 5\' 10"  (1.778 m)    Weight:   Wt Readings from Last 1 Encounters:  11/23/21 71.4 kg    BMI:  Body mass index is 22.59 kg/m.  Estimated Nutritional Needs:   Kcal:  2000-2200 kcals  Protein:  110-130 g  Fluid:  >/= 2 L   Kerman Passey MS, RDN, LDN, CNSC Registered Dietitian III Clinical Nutrition RD Pager and On-Call Pager Number Located in [place]

## 2021-11-24 NOTE — Progress Notes (Signed)
[facility] Progress Note Patient Name: Clinton [name] DOB: 05-Mar-1941 MRN: [id number]   Date of Service  11/24/2021  HPI/Events of Note  ABG - severe resp alkalosis  eICU Interventions  Lower RR to 18 from 28     Intervention Category Major Interventions: Acid-Base disturbance - evaluation and management  Anais Denslow V. Makarios Madlock 11/24/2021, 5:01 AM

## 2021-11-24 NOTE — Progress Notes (Signed)
NAME:  Clinton [name], MRN:  [id number], DOB:  Mar 09, 1941, LOS: 1 ADMISSION DATE:  11/23/2021, CONSULTATION DATE:  12/16 REFERRING MD:  Barry Dienes, CHIEF COMPLAINT:  respiratory failure   History of Present Illness:  Clinton [name] is a 80 y/o gentleman with a history of CAD, GERD, HTN who presented from [facility] with altered mental status and fever. When EMS found him he was nonresponsive and cyanotic. They BVM him until he arrived at the ED where he was intubated. He has a DNR form at bedside that limits his resuscitation only by not wanting chest compressions, but he was ok with other forms of support. He was seen about 2 hours prior to arrival by his outpatient Palliative Care team who noted symptoms of weakness, sleeping more, cough, malaise. He had labs ordered at that time. No family at bedside to provide additional history. Per ER nurse he has a chronic indwelling foley, but this has been changed in the ED.  He was started on flagyl, vanc, cefepime in the ED, but unfortunately blood cultures were not drawn prior to antibiotic administration.  Pertinent  Medical History  GERD HTN CAD Knee OA Spinal stenosis Syncope-- ED visit 09/2021  Significant Hospital Events: Including procedures, antibiotic start and stop dates in addition to other pertinent events   12/16 admission, intubated  Interim History / Subjective:  Admitted, Bps a bit better. Cr better UOP ok.  Objective   Blood pressure 125/65, pulse 84, temperature 98.6 F (37 C), temperature source Oral, resp. rate (!) 30, height 5\' 10"  (1.778 m), weight 71.4 kg, SpO2 100 %.    Vent Mode: PSV;CPAP FiO2 (%):  [40 %-100 %] 40 % Set Rate:  [18 bmp-28 bmp] 18 bmp Vt Set:  [580 mL] 580 mL PEEP:  [5 cmH20] 5 cmH20 Pressure Support:  [5 cmH20-10 cmH20] 5 cmH20 Plateau Pressure:  [16 cmH20-20 cmH20] 20 cmH20   Intake/Output Summary (Last 24 hours) at 11/24/2021 1301 Last data filed at 11/24/2021 1135 Gross per 24 hour   Intake 4462.09 ml  Output 2345 ml  Net 2117.09 ml    Filed Weights   11/23/21 2016  Weight: 71.4 kg    Examination: General: Critically ill appearing man lying in bed in NAD HENT: [place]/AT, eyes anicteric, ETT in place.  Lungs: CTAB, minimal clear secretions from ETT Cardiovascular: S1S2, RRR Abdomen: soft, NT Extremities: minimal muscle mass, no cyanosis or clubbing Neuro: follows commands GU: foley , normal external genitalia     Resolved Hospital Problem list     Assessment & Plan:  Septic shock due to UTI with AKI, encephalopathy, severe lactic acidosis -foley changed at admission and urine culture sent from new foley> follow until finalized (pan sensitive E coli in past) -con't cefepime and vanc -renal US with Mild hydronephrosis in renal pelvis prominence on the right -maintain MAP >65; low dose peripheral NE  Acute metabolic encephalopathy due to sepsis: Improved, follow commands 12/17 but still generally encephalopathic -supportive care -avoid deliriogenic meds as able  Lactic acidosis due to sepsis: trending down with fluids -maintain MAP >65  Acute respiratory failure with hypoxia due to sepsis -LTVV, 4-8cc/kg IBW with goal Pplat<30 and DP<15 -rate increased to accommodate for metabolic acidosis, alkalemic early AM 12/17 so rate decreased -PAD protocol, fentanyl PRN for sedation -VAP prevention protocol  Hyperkalemia due to AKI Acute metabolic acidosis AKI due to sepsis Chronic foley-- unclear reasoning -renal US as above -renally dose meds, avoid nephrotoxic meds -strict I/Os -con't foley -hyperK  improved, Cr improving, UOP ok  HTN -hold antihypertensives; home meds unknown. Pharmacy looking for med list from NH.   Best Practice (right click and "Reselect all SmartList Selections" daily)   Diet/type: tubefeeds, trickle DVT prophylaxis: prophylactic heparin  GI prophylaxis: PPI Lines: N/A Foley:  Yes, and it is still needed Code Status:   limited Last date of multidisciplinary goals of care discussion [ unable to contact family 12/16]  Labs   CBC: Recent Labs  Lab 11/23/21 1530 11/23/21 1538 11/23/21 1638 11/23/21 2000 11/24/21 0455 11/24/21 0612  WBC 13.9*  --   --  17.0*  --  14.6*  NEUTROABS 13.1*  --   --   --   --   --   HGB 16.6 17.0   17.3* 13.3 13.1 10.2* 11.1*  HCT 51.2 50.0   51.0 39.0 41.1 30.0* 32.1*  MCV 96.4  --   --  99.5  --  93.0  PLT 170  --   --  127*  --  110*     Basic Metabolic Panel: Recent Labs  Lab 11/23/21 1530 11/23/21 1538 11/23/21 1638 11/23/21 2000 11/24/21 0455 11/24/21 0612  NA 141 142   141 140 140 139 139  K 5.3* 5.4*   5.4* 4.2 4.4 3.6 3.8  CL 109 113*  --  105  --  107  CO2 14*  --   --  18*  --  20*  GLUCOSE 104* 98  --  61*  --  118*  BUN 82* 96*  --  79*  --  81*  CREATININE 4.24* 3.70*  --  3.61*  --  3.10*  CALCIUM 9.4  --   --  8.8*  --  8.1*  MG  --   --   --   --   --  1.7  PHOS  --   --   --   --   --  2.7    GFR: Estimated Creatinine Clearance: 19.5 mL/min (A) (by C-G formula based on SCr of 3.1 mg/dL (H)). Recent Labs  Lab 11/23/21 1530 11/23/21 1730 11/23/21 2000 11/24/21 0612  WBC 13.9*  --  17.0* 14.6*  LATICACIDVEN 7.4* 6.0*  --   --      Liver Function Tests: Recent Labs  Lab 11/23/21 1530  AST 34  ALT 14  ALKPHOS 124  BILITOT 1.0  PROT 7.2  ALBUMIN 3.3*    No results for input(s): LIPASE, AMYLASE in the last 168 hours. No results for input(s): AMMONIA in the last 168 hours.  ABG    Component Value Date/Time   PHART 7.607 (HH) 11/24/2021 0455   PCO2ART 18.3 (LL) 11/24/2021 0455   PO2ART 116 (H) 11/24/2021 0455   HCO3 18.3 (L) 11/24/2021 0455   TCO2 19 (L) 11/24/2021 0455   ACIDBASEDEF 2.0 11/24/2021 0455   O2SAT 99.0 11/24/2021 0455      Coagulation Profile: Recent Labs  Lab 11/23/21 1530  INR 1.3*     Cardiac Enzymes: No results for input(s): CKTOTAL, CKMB, CKMBINDEX, TROPONINI in the last 168  hours.  HbA1C: Hgb A1c MFr Bld  Date/Time Value Ref Range Status  04/16/2018 02:49 PM 5.6 4.6 - 6.5 % Final    Comment:    Glycemic Control Guidelines for People with Diabetes:Non Diabetic:  <6%Goal of Therapy: <7%Additional Action Suggested:  >8%   10/12/2015 02:04 PM 5.4 4.6 - 6.5 % Final    Comment:    Glycemic Control Guidelines for People with Diabetes:Non  Diabetic:  <6%Goal of Therapy: <7%Additional Action Suggested:  >8%     CBG: Recent Labs  Lab 11/23/21 2326 11/24/21 0004 11/24/21 0329 11/24/21 0736 11/24/21 1130  GLUCAP 57* 103* 114* 109* 129*    Review of Systems:   Unable to be obtained due to intubated, sedated.  Past Medical History:  He,  has a past medical history of Anxiety, Arthritis, Coronary artery disease, Erectile dysfunction, Fall (01/17/2021), Frozen shoulder, GERD (gastroesophageal reflux disease), Guillain Barr syndrome ([place]) (2009), History of elevated lipids, Hypertension, Osteoarthritis, and Spinal stenosis (11/24/2014).   Surgical History:   Past Surgical History:  Procedure Laterality Date   CARDIAC SURGERY     2 stents placed    CORONARY ANGIOPLASTY     mid and proximal LAD stent 09/1999 [facility])   posterior lumbar fusion L4-5  11-24-14     Social History:   reports that he quit smoking about 47 years ago. His smoking use included cigarettes. He has a 5.00 pack-year smoking history. He has never used smokeless tobacco. He reports current alcohol use. He reports that he does not use drugs.   Family History:  His family history includes Heart Problems in his father; Heart disease in his father; Sudden death in his father.   Allergies No Known Allergies   Home Medications  Prior to Admission medications   Medication Sig Start Date End Date Taking? Authorizing Provider  aspirin 81 MG chewable tablet Chew 1 tablet (81 mg total) by mouth daily. 02/06/21   Medina-Vargas, Monina C, NP  calcium carbonate (TUMS EX) 750 MG chewable  tablet Chew 1 tablet (750 mg total) by mouth 3 (three) times daily. Patient taking differently: Chew 1 tablet by mouth 3 (three) times daily as needed for heartburn (acid reflux). 02/06/21   Medina-Vargas, Monina C, NP  cephALEXin (KEFLEX) 500 MG capsule Take 1 capsule (500 mg total) by mouth 4 (four) times daily. 09/17/21   Godfrey Pick, MD  feeding supplement (ENSURE ENLIVE / ENSURE PLUS) LIQD Take 237 mLs by mouth 3 (three) times daily between meals. 02/21/21   Florencia Reasons, MD  Multiple Vitamin (MULTIVITAMIN WITH MINERALS) TABS tablet Take 1 tablet by mouth daily. 02/22/21   Florencia Reasons, MD  senna-docusate (SENOKOT-S) 8.6-50 MG tablet Take 1 tablet by mouth at bedtime. 02/21/21   Florencia Reasons, MD     Critical care time:    CRITICAL CARE Performed by: Lanier Clam   Total critical care time: 32 minutes  Critical care time was exclusive of separately billable procedures and treating other patients.  Critical care was necessary to treat or prevent imminent or life-threatening deterioration.  Critical care was time spent personally by me on the following activities: development of treatment plan with patient and/or surrogate as well as nursing, discussions with consultants, evaluation of patient's response to treatment, examination of patient, obtaining history from patient or surrogate, ordering and performing treatments and interventions, ordering and review of laboratory studies, ordering and review of radiographic studies, pulse oximetry and re-evaluation of patient's condition.   Lanier Clam, MD 11/24/21 1:01 PM [facility]

## 2021-11-24 NOTE — Plan of Care (Signed)
°  Problem: Education: Goal: Knowledge of General Education information will improve Description: Including pain rating scale, medication(s)/side effects and non-pharmacologic comfort measures Outcome: Progressing   Problem: Health Behavior/Discharge Planning: Goal: Ability to manage health-related needs will improve Outcome: Progressing   Problem: Clinical Measurements: Goal: Ability to maintain clinical measurements within normal limits will improve Outcome: Progressing Goal: Will remain free from infection Outcome: Progressing Goal: Diagnostic test results will improve Outcome: Progressing Goal: Respiratory complications will improve Outcome: Progressing Goal: Cardiovascular complication will be avoided Outcome: Progressing   Problem: Nutrition: Goal: Adequate nutrition will be maintained Outcome: Progressing   Problem: Activity: Goal: Risk for activity intolerance will decrease Outcome: Progressing   Problem: Coping: Goal: Level of anxiety will decrease Outcome: Progressing   Problem: Elimination: Goal: Will not experience complications related to bowel motility Outcome: Progressing Goal: Will not experience complications related to urinary retention Outcome: Progressing   Problem: Pain Managment: Goal: General experience of comfort will improve Outcome: Progressing   Problem: Safety: Goal: Ability to remain free from injury will improve Outcome: Progressing   Problem: Skin Integrity: Goal: Risk for impaired skin integrity will decrease Outcome: Progressing   Problem: Respiratory: Goal: Ability to maintain a clear airway and adequate ventilation will improve Outcome: Progressing   Problem: Role Relationship: Goal: Method of communication will improve Outcome: Progressing

## 2021-11-25 ENCOUNTER — Inpatient Hospital Stay (HOSPITAL_COMMUNITY): Payer: Medicare Other

## 2021-11-25 LAB — GLUCOSE, CAPILLARY
Glucose-Capillary: 99 mg/dL (ref 70–99)
Glucose-Capillary: 113 mg/dL — ABNORMAL HIGH (ref 70–99)
Glucose-Capillary: 83 mg/dL (ref 70–99)
Glucose-Capillary: 67 mg/dL — ABNORMAL LOW (ref 70–99)
Glucose-Capillary: 77 mg/dL (ref 70–99)
Glucose-Capillary: 77 mg/dL (ref 70–99)

## 2021-11-25 LAB — BASIC METABOLIC PANEL
Anion gap: 8 (ref 5–15)
BUN: 67 mg/dL — ABNORMAL HIGH (ref 8–23)
CO2: 19 mmol/L — ABNORMAL LOW (ref 22–32)
Calcium: 7.8 mg/dL — ABNORMAL LOW (ref 8.9–10.3)
Chloride: 107 mmol/L (ref 98–111)
Creatinine, Ser: 2.03 mg/dL — ABNORMAL HIGH (ref 0.61–1.24)
GFR, Estimated: 33 mL/min — ABNORMAL LOW (ref >60)
Glucose, Bld: 106 mg/dL — ABNORMAL HIGH (ref 70–99)
Potassium: 3.5 mmol/L (ref 3.5–5.1)
Sodium: 134 mmol/L — ABNORMAL LOW (ref 135–145)

## 2021-11-25 LAB — VANCOMYCIN, RANDOM: Vancomycin Rm: 12

## 2021-11-25 LAB — MAGNESIUM: Magnesium: 2.1 mg/dL (ref 1.7–2.4)

## 2021-11-25 LAB — PHOSPHORUS: Phosphorus: 2.6 mg/dL (ref 2.5–4.6)

## 2021-11-25 MED ORDER — ORAL CARE MOUTH RINSE
15.0000 mL | Freq: Two times a day (BID) | OROMUCOSAL | Status: DC
  Administered 2021-11-25 – 2021-11-30 (×10): 15 mL via OROMUCOSAL

## 2021-11-25 MED ORDER — DEXTROSE 50 % IV SOLN
INTRAVENOUS | Status: AC
  Administered 2021-11-25: 16:00:00 12.5 g via INTRAVENOUS
  Filled 2021-11-25: qty 50

## 2021-11-25 MED ORDER — PANTOPRAZOLE SODIUM 40 MG PO TBEC
40.0000 mg | DELAYED_RELEASE_TABLET | Freq: Every day | ORAL | Status: DC
  Administered 2021-11-25: 23:00:00 40 mg via ORAL
  Filled 2021-11-25: qty 1

## 2021-11-25 MED ORDER — SODIUM CHLORIDE 0.9 % IV SOLN
2.0000 g | Freq: Two times a day (BID) | INTRAVENOUS | Status: DC
  Administered 2021-11-25 – 2021-11-26 (×2): 2 g via INTRAVENOUS
  Filled 2021-11-25 (×2): qty 2

## 2021-11-25 MED ORDER — DEXTROSE 50 % IV SOLN: 12.5000 g | INTRAVENOUS | Status: AC

## 2021-11-25 MED ORDER — POTASSIUM CHLORIDE 20 MEQ PO PACK
40.0000 meq | PACK | Freq: Once | ORAL | Status: AC
  Administered 2021-11-25: 08:00:00 40 meq
  Filled 2021-11-25: qty 2

## 2021-11-25 MED ORDER — DEXTROSE 10 % IV SOLN: INTRAVENOUS | Status: DC

## 2021-11-25 MED ORDER — POTASSIUM & SODIUM PHOSPHATES 280-160-250 MG PO PACK
2.0000 | PACK | ORAL | Status: AC
  Administered 2021-11-25 (×2): 2
  Filled 2021-11-25 (×2): qty 2

## 2021-11-25 MED ORDER — PANTOPRAZOLE 2 MG/ML SUSPENSION
40.0000 mg | Freq: Every day | ORAL | Status: DC
Start: 2021-11-25 — End: 2021-11-25

## 2021-11-25 MED ORDER — VANCOMYCIN HCL 750 MG/150ML IV SOLN
750.0000 mg | INTRAVENOUS | Status: DC
  Administered 2021-11-25: 08:00:00 750 mg via INTRAVENOUS
  Filled 2021-11-25: qty 150

## 2021-11-25 MED ORDER — ACETAMINOPHEN 160 MG/5ML PO SOLN
650.0000 mg | ORAL | Status: DC | PRN
  Administered 2021-11-29: 650 mg via ORAL
  Filled 2021-11-25: qty 20.3

## 2021-11-25 NOTE — Progress Notes (Signed)
Hypoglycemic Event  CBG: 67  Treatment: D50 25 mL (12.5 gm)  Symptoms: None  Follow-up CBG: Time: 1649 (delayed due to head CT) CBG Result: 77  Possible Reasons for Event: Inadequate meal intake  Comments/MD notified: Dr. Silas Flood, new order D10% @50 /hr   Guadalupe Dawn

## 2021-11-25 NOTE — Progress Notes (Signed)
Pharmacy Antibiotic Note  Clinton [name] is a 80 y.o. male admitted on 11/23/2021 with AMS. Pharmacy has been consulted for cefepime and vancomycin dosing for sepsis.  CXR shows atelectasis vs PNA.  Renal function improving and expect it to continue to improve, now afebrile, WBC down to 14.6.  Plan: Schedule vanc 750mg  IV Q24H for AUC 493 using SCr 2.03 Increase cefepime to 2gm IV Q12H Monitor renal fxn, clinical progress, vanc level as indicated  Give KCL 24mEq PT and Phos NAK 2 packets PT Q4H x 2 doses  Height: 5\' 10"  (177.8 cm) Weight: 71.2 kg (156 lb 15.5 oz) IBW/kg (Calculated) : 73  Temp (24hrs), Avg:99 F (37.2 C), Min:97.6 F (36.4 C), Max:99.9 F (37.7 C)  Recent Labs  Lab 11/23/21 1530 11/23/21 1538 11/23/21 1730 11/23/21 2000 11/24/21 0612 11/25/21 0127  WBC 13.9*  --   --  17.0* 14.6*  --   CREATININE 4.24* 3.70*  --  3.61* 3.10* 2.03*  LATICACIDVEN 7.4*  --  6.0*  --   --   --   VANCORANDOM  --   --   --   --   --  12     Estimated Creatinine Clearance: 29.7 mL/min (A) (by C-G formula based on SCr of 2.03 mg/dL (H)).    No Known Allergies   Vanc 12/16 >> Cefepime 12/16 >> Flagyl x1 12/16   12/16 vanc 1500mg  IV at 1624 12/18 VR = 12 mcg/mL >> schedule vanc    12/16 UCx -  1216 BCx - NGTD 12/17 MRSA PCR - negative  Jaimen Melone D. Mina Marble, PharmD, BCPS, [place] 11/25/2021, 7:19 AM

## 2021-11-25 NOTE — Procedures (Signed)
Extubation Procedure Note  Patient Details:   Name: Clinton [name] DOB: March 22, 1941 MRN: [id number]   Airway Documentation:    Vent end date: 11/25/21 Vent end time: 1159   Evaluation  O2 sats: stable throughout Complications: No apparent complications Patient did tolerate procedure well. Bilateral Breath Sounds: Rhonchi, Diminished   Pt extubated to 4L [place] per MD order. Pt had positive cuff leak  prior to extubation. No stridor noted. Pt able to voice his name.  Vilinda Blanks 11/25/2021, 12:00 PM

## 2021-11-25 NOTE — Progress Notes (Signed)
Called to bedside. L arm swollen. Can not oppose gravity LUE, weak grip. LLE weak, can not oppose gravity, RLE 3+/5. Wiggles toes bilaterally. Mild flattening L nasolabial fold, mild L facial droop. Last known normal > 48 hrs ago. Subacute stroke possible. CT head w/o contrast ordered.

## 2021-11-25 NOTE — Progress Notes (Signed)
NAME:  Clinton [name], MRN:  [id number], DOB:  1941/10/19, LOS: 2 ADMISSION DATE:  11/23/2021, CONSULTATION DATE:  12/16 REFERRING MD:  Barry Dienes, CHIEF COMPLAINT:  respiratory failure   History of Present Illness:  Mr. Clinton [name] is a 80 y/o gentleman with a history of CAD, GERD, HTN who presented from [facility] with altered mental status and fever. When EMS found him he was nonresponsive and cyanotic. They BVM him until he arrived at the ED where he was intubated. He has a DNR form at bedside that limits his resuscitation only by not wanting chest compressions, but he was ok with other forms of support. He was seen about 2 hours prior to arrival by his outpatient Palliative Care team who noted symptoms of weakness, sleeping more, cough, malaise. He had labs ordered at that time. No family at bedside to provide additional history. Per ER nurse he has a chronic indwelling foley, but this has been changed in the ED.  He was started on flagyl, vanc, cefepime in the ED, but unfortunately blood cultures were not drawn prior to antibiotic administration.  Pertinent  Medical History  GERD HTN CAD Knee OA Spinal stenosis Syncope-- ED visit 09/2021  Significant Hospital Events: Including procedures, antibiotic start and stop dates in addition to other pertinent events   12/16 admission, intubated 12/17 UOP ok 12/18 alert, follows commands, SBT in AM  Interim History / Subjective:  Cr better, UOP good  Objective   Blood pressure (!) 169/86, pulse 82, temperature 99 F (37.2 C), temperature source Oral, resp. rate (!) 28, height 5\' 10"  (1.778 m), weight 71.2 kg, SpO2 96 %.    Vent Mode: CPAP;PSV FiO2 (%):  [40 %] 40 % Set Rate:  [18 bmp] 18 bmp Vt Set:  [580 mL] 580 mL PEEP:  [5 cmH20] 5 cmH20 Pressure Support:  [5 cmH20] 5 cmH20 Plateau Pressure:  [11 cmH20-17 cmH20] 17 cmH20   Intake/Output Summary (Last 24 hours) at 11/25/2021 0833 Last data filed at 11/25/2021 0800 Gross per 24  hour  Intake 3141.76 ml  Output 2575 ml  Net 566.76 ml    Filed Weights   11/23/21 2016 11/25/21 0500  Weight: 71.4 kg 71.2 kg    Examination: General: Elderly appearing man lying in bed in NAD HENT: [place]/AT, eyes anicteric, ETT in place.  Lungs: CTAB, minimal clear secretions from ETT Cardiovascular: S1S2, RRR Abdomen: soft, NT Extremities: minimal muscle mass, no cyanosis or clubbing Neuro: follows commands GU: foley , normal external genitalia   Resolved Hospital Problem list     Assessment & Plan:  Septic shock due to UTI with AKI, encephalopathy, severe lactic acidosis -foley changed at admission and urine culture sent from new foley> follow until finalized (pan sensitive E coli in past) -con't cefepime and vanc; plan d/c vanc if blood cultures negative at 48 hrs -renal US with Mild hydronephrosis in renal pelvis prominence on the right -weaned off pressors  Acute metabolic encephalopathy due to sepsis: Improved, follow commands 12/17 and further improvement 12/18 -supportive care -avoid deliriogenic meds as able  Lactic acidosis due to sepsis: trending down with fluids -maintain MAP >65  Acute respiratory failure with hypoxia due to sepsis -LTVV, 4-8cc/kg IBW with goal Pplat<30 and DP<15 -PAD protocol, fentanyl PRN for sedation -VAP prevention protocol -SBT 12/18 AM  Hyperkalemia due to AKI Acute metabolic acidosis AKI due to sepsis Chronic foley-- unclear reasoning -renal US as above -renally dose meds, avoid nephrotoxic meds -strict I/Os -con't foley -hyperK improved,  Cr improving, UOP ok  HTN -hold antihypertensives; home meds unknown. Pharmacy looking for med list from NH.   Best Practice (right click and "Reselect all SmartList Selections" daily)   Diet/type: tubefeeds, advance as tolerated DVT prophylaxis: prophylactic heparin  GI prophylaxis: PPI Lines: N/A Foley:  Yes, and it is still needed Code Status:  limited Last date of  multidisciplinary goals of care discussion [ unable to contact family 12/16]  Labs   CBC: Recent Labs  Lab 11/23/21 1530 11/23/21 1538 11/23/21 1638 11/23/21 2000 11/24/21 0455 11/24/21 0612  WBC 13.9*  --   --  17.0*  --  14.6*  NEUTROABS 13.1*  --   --   --   --   --   HGB 16.6 17.0   17.3* 13.3 13.1 10.2* 11.1*  HCT 51.2 50.0   51.0 39.0 41.1 30.0* 32.1*  MCV 96.4  --   --  99.5  --  93.0  PLT 170  --   --  127*  --  110*     Basic Metabolic Panel: Recent Labs  Lab 11/23/21 1530 11/23/21 1538 11/23/21 1638 11/23/21 2000 11/24/21 0455 11/24/21 0612 11/25/21 0127  NA 141 142   141 140 140 139 139 134*  K 5.3* 5.4*   5.4* 4.2 4.4 3.6 3.8 3.5  CL 109 113*  --  105  --  107 107  CO2 14*  --   --  18*  --  20* 19*  GLUCOSE 104* 98  --  61*  --  118* 106*  BUN 82* 96*  --  79*  --  81* 67*  CREATININE 4.24* 3.70*  --  3.61*  --  3.10* 2.03*  CALCIUM 9.4  --   --  8.8*  --  8.1* 7.8*  MG  --   --   --   --   --  1.7 2.1  PHOS  --   --   --   --   --  2.7 2.6    GFR: Estimated Creatinine Clearance: 29.7 mL/min (A) (by C-G formula based on SCr of 2.03 mg/dL (H)). Recent Labs  Lab 11/23/21 1530 11/23/21 1730 11/23/21 2000 11/24/21 0612  WBC 13.9*  --  17.0* 14.6*  LATICACIDVEN 7.4* 6.0*  --   --      Liver Function Tests: Recent Labs  Lab 11/23/21 1530  AST 34  ALT 14  ALKPHOS 124  BILITOT 1.0  PROT 7.2  ALBUMIN 3.3*    No results for input(s): LIPASE, AMYLASE in the last 168 hours. No results for input(s): AMMONIA in the last 168 hours.  ABG    Component Value Date/Time   PHART 7.607 (HH) 11/24/2021 0455   PCO2ART 18.3 (LL) 11/24/2021 0455   PO2ART 116 (H) 11/24/2021 0455   HCO3 18.3 (L) 11/24/2021 0455   TCO2 19 (L) 11/24/2021 0455   ACIDBASEDEF 2.0 11/24/2021 0455   O2SAT 99.0 11/24/2021 0455      Coagulation Profile: Recent Labs  Lab 11/23/21 1530  INR 1.3*     Cardiac Enzymes: No results for input(s): CKTOTAL, CKMB, CKMBINDEX,  TROPONINI in the last 168 hours.  HbA1C: Hgb A1c MFr Bld  Date/Time Value Ref Range Status  04/16/2018 02:49 PM 5.6 4.6 - 6.5 % Final    Comment:    Glycemic Control Guidelines for People with Diabetes:Non Diabetic:  <6%Goal of Therapy: <7%Additional Action Suggested:  >8%   10/12/2015 02:04 PM 5.4 4.6 - 6.5 % Final  Comment:    Glycemic Control Guidelines for People with Diabetes:Non Diabetic:  <6%Goal of Therapy: <7%Additional Action Suggested:  >8%     CBG: Recent Labs  Lab 11/24/21 1526 11/24/21 1939 11/24/21 2331 11/25/21 0324 11/25/21 0800  GLUCAP 89 116* 110* 99 113*     Review of Systems:   Unable to be obtained due to intubated, sedated.  Past Medical History:  He,  has a past medical history of Anxiety, Arthritis, Coronary artery disease, Erectile dysfunction, Fall (01/17/2021), Frozen shoulder, GERD (gastroesophageal reflux disease), Guillain Barr syndrome ([place]) (2009), History of elevated lipids, Hypertension, Osteoarthritis, and Spinal stenosis (11/24/2014).   Surgical History:   Past Surgical History:  Procedure Laterality Date   CARDIAC SURGERY     2 stents placed    CORONARY ANGIOPLASTY     mid and proximal LAD stent 09/1999 [facility])   posterior lumbar fusion L4-5  11-24-14     Social History:   reports that he quit smoking about 47 years ago. His smoking use included cigarettes. He has a 5.00 pack-year smoking history. He has never used smokeless tobacco. He reports current alcohol use. He reports that he does not use drugs.   Family History:  His family history includes Heart Problems in his father; Heart disease in his father; Sudden death in his father.   Allergies No Known Allergies   Home Medications  Prior to Admission medications   Medication Sig Start Date End Date Taking? Authorizing Provider  aspirin 81 MG chewable tablet Chew 1 tablet (81 mg total) by mouth daily. 02/06/21   Medina-Vargas, Monina C, NP  calcium carbonate  (TUMS EX) 750 MG chewable tablet Chew 1 tablet (750 mg total) by mouth 3 (three) times daily. Patient taking differently: Chew 1 tablet by mouth 3 (three) times daily as needed for heartburn (acid reflux). 02/06/21   Medina-Vargas, Monina C, NP  cephALEXin (KEFLEX) 500 MG capsule Take 1 capsule (500 mg total) by mouth 4 (four) times daily. 09/17/21   Godfrey Pick, MD  feeding supplement (ENSURE ENLIVE / ENSURE PLUS) LIQD Take 237 mLs by mouth 3 (three) times daily between meals. 02/21/21   Florencia Reasons, MD  Multiple Vitamin (MULTIVITAMIN WITH MINERALS) TABS tablet Take 1 tablet by mouth daily. 02/22/21   Florencia Reasons, MD  senna-docusate (SENOKOT-S) 8.6-50 MG tablet Take 1 tablet by mouth at bedtime. 02/21/21   Florencia Reasons, MD     Critical care time:    CRITICAL CARE Performed by: Lanier Clam   Total critical care time: 31 minutes  Critical care time was exclusive of separately billable procedures and treating other patients.  Critical care was necessary to treat or prevent imminent or life-threatening deterioration.  Critical care was time spent personally by me on the following activities: development of treatment plan with patient and/or surrogate as well as nursing, discussions with consultants, evaluation of patient's response to treatment, examination of patient, obtaining history from patient or surrogate, ordering and performing treatments and interventions, ordering and review of laboratory studies, ordering and review of radiographic studies, pulse oximetry and re-evaluation of patient's condition.   Lanier Clam, MD 11/25/21 8:33 AM [facility]

## 2021-11-26 ENCOUNTER — Inpatient Hospital Stay (HOSPITAL_COMMUNITY): Payer: Medicare Other

## 2021-11-26 DIAGNOSIS — M7989 Other specified soft tissue disorders: Secondary | ICD-10-CM

## 2021-11-26 LAB — CBC
HCT: 32.6 % — ABNORMAL LOW (ref 39.0–52.0)
Hemoglobin: 11.2 g/dL — ABNORMAL LOW (ref 13.0–17.0)
MCH: 32 pg (ref 26.0–34.0)
MCHC: 34.4 g/dL (ref 30.0–36.0)
MCV: 93.1 fL (ref 80.0–100.0)
Platelets: 101 10*3/uL — ABNORMAL LOW (ref 150–400)
RBC: 3.5 MIL/uL — ABNORMAL LOW (ref 4.22–5.81)
RDW: 13.9 % (ref 11.5–15.5)
WBC: 8.4 10*3/uL (ref 4.0–10.5)
nRBC: 0 % (ref 0.0–0.2)

## 2021-11-26 LAB — URINE CULTURE: Culture: >=100000 — AB

## 2021-11-26 LAB — BASIC METABOLIC PANEL
Anion gap: 10 (ref 5–15)
BUN: 51 mg/dL — ABNORMAL HIGH (ref 8–23)
CO2: 19 mmol/L — ABNORMAL LOW (ref 22–32)
Calcium: 8 mg/dL — ABNORMAL LOW (ref 8.9–10.3)
Chloride: 107 mmol/L (ref 98–111)
Creatinine, Ser: 1.51 mg/dL — ABNORMAL HIGH (ref 0.61–1.24)
GFR, Estimated: 47 mL/min — ABNORMAL LOW (ref >60)
Glucose, Bld: 85 mg/dL (ref 70–99)
Potassium: 3.2 mmol/L — ABNORMAL LOW (ref 3.5–5.1)
Sodium: 136 mmol/L (ref 135–145)

## 2021-11-26 LAB — GLUCOSE, CAPILLARY
Glucose-Capillary: 103 mg/dL — ABNORMAL HIGH (ref 70–99)
Glucose-Capillary: 116 mg/dL — ABNORMAL HIGH (ref 70–99)
Glucose-Capillary: 131 mg/dL — ABNORMAL HIGH (ref 70–99)
Glucose-Capillary: 74 mg/dL (ref 70–99)
Glucose-Capillary: 82 mg/dL (ref 70–99)
Glucose-Capillary: 93 mg/dL (ref 70–99)
Glucose-Capillary: 97 mg/dL (ref 70–99)

## 2021-11-26 MED ORDER — LOPERAMIDE HCL 2 MG PO CAPS: 2.0000 mg | ORAL_CAPSULE | Freq: Four times a day (QID) | ORAL | Status: DC | PRN

## 2021-11-26 MED ORDER — LOPERAMIDE HCL 2 MG PO CAPS
4.0000 mg | ORAL_CAPSULE | Freq: Once | ORAL | Status: AC
  Administered 2021-11-26: 11:00:00 4 mg via ORAL
  Filled 2021-11-26: qty 2

## 2021-11-26 MED ORDER — SODIUM CHLORIDE 0.9 % IV SOLN
1.0000 g | Freq: Every day | INTRAVENOUS | Status: AC
  Administered 2021-11-26 – 2021-11-29 (×4): 1 g via INTRAVENOUS
  Filled 2021-11-26 (×4): qty 10

## 2021-11-26 MED ORDER — POTASSIUM CHLORIDE 10 MEQ/100ML IV SOLN
10.0000 meq | INTRAVENOUS | Status: AC
  Administered 2021-11-26 (×6): 10 meq via INTRAVENOUS
  Filled 2021-11-26 (×6): qty 100

## 2021-11-26 NOTE — Progress Notes (Signed)
Left upper extremity venous duplex has been completed. Preliminary results can be found in CV Proc through chart review.  Results were given to the patient's nurse, [name].  11/26/21 1:34 PM Clinton [name] RVT

## 2021-11-26 NOTE — Progress Notes (Signed)
NAME:  Clinton [name], MRN:  [id number], DOB:  Sep 13, 1941, LOS: 3 ADMISSION DATE:  11/23/2021, CONSULTATION DATE:  12/16 REFERRING MD:  Barry Dienes, CHIEF COMPLAINT:  respiratory failure   History of Present Illness:  Clinton [name] is a 80 y/o gentleman with a history of CAD, GERD, HTN who presented from [facility] with altered mental status and fever. When EMS found him he was nonresponsive and cyanotic. They BVM him until he arrived at the ED where he was intubated. He has a DNR form at bedside that limits his resuscitation only by not wanting chest compressions, but he was ok with other forms of support. He was seen about 2 hours prior to arrival by his outpatient Palliative Care team who noted symptoms of weakness, sleeping more, cough, malaise. He had labs ordered at that time. No family at bedside to provide additional history. Per ER nurse he has a chronic indwelling foley, but this has been changed in the ED.  He was started on flagyl, vanc, cefepime in the ED, but unfortunately blood cultures were not drawn prior to antibiotic administration.  Pertinent  Medical History  GERD HTN CAD Knee OA Spinal stenosis Syncope-- ED visit 09/2021  Significant Hospital Events: Including procedures, antibiotic start and stop dates in addition to other pertinent events   12/16 admission, intubated 12/17 UOP ok 12/18 alert, follows commands, SBT in AM, asymmetric L sided weakness, CT head ok, MRI ordered  Interim History / Subjective:  NAEON. CT head clear, diffusely weak but more so on left. MRI not performed yet. Out of window for tPA but low suspicion of stroke anyway.  Objective   Blood pressure 124/74, pulse 67, temperature (!) 97.5 F (36.4 C), temperature source Oral, resp. rate 18, height 5\' 10"  (1.778 m), weight 71.2 kg, SpO2 100 %.        Intake/Output Summary (Last 24 hours) at 11/26/2021 0912 Last data filed at 11/26/2021 0800 Gross per 24 hour  Intake 1262.38 ml  Output  1855 ml  Net -592.62 ml    Filed Weights   11/23/21 2016 11/25/21 0500  Weight: 71.4 kg 71.2 kg    Examination: General: Elderly appearing man lying in bed in NAD HENT: [place]/AT, eyes anicteric, ETT in place.  Lungs: CTAB, diminished Cardiovascular: S1S2, RRR Abdomen: soft, NT Extremities: minimal muscle mass, no cyanosis or clubbing Neuro: follows commands   Resolved Hospital Problem list     Assessment & Plan:  Septic shock due to complicated UTI with AKI, encephalopathy, severe lactic acidosis (resolved) -foley changed at admission and urine culture sent from new foley -Providencia stuartii and proteus mirabilis both with significant resistances but both sensitive to CTX (cefepime 12/16-12/19, CTX 12/19>> plan 7 days total abx) -con't cefepime and vanc; plan d/c vanc if blood cultures negative at 48 hrs -renal US with Mild hydronephrosis in renal pelvis prominence on the right -weaned off pressors -mental status improved with intermittent delirium  Acute metabolic encephalopathy due to sepsis: Resolved -supportive care -avoid deliriogenic meds as able  Acute respiratory failure with hypoxia due to sepsis -extubated 12/18  Hyperkalemia due to AKI Acute metabolic acidosis AKI due to sepsis Chronic foley-- unclear reasoning -renal US as above -renally dose meds, avoid nephrotoxic meds -strict I/Os -con't foley -hyperK improved, Cr improving, UOP ok  HTN -hold antihypertensives; home meds unknown. Pharmacy looking for med list from [facility].   Best Practice (right click and "Reselect all SmartList Selections" daily)   Diet/type: tubefeeds, advance as tolerated DVT prophylaxis:  prophylactic heparin  GI prophylaxis: PPI Lines: N/A Foley:  Yes, and it is still needed Code Status:  limited Last date of multidisciplinary goals of care discussion [n/a]  Transfer out of ICU  Labs   CBC: Recent Labs  Lab 11/23/21 1530 11/23/21 1538 11/23/21 1638 11/23/21 2000  11/24/21 0455 11/24/21 0612 11/26/21 0206  WBC 13.9*  --   --  17.0*  --  14.6* 8.4  NEUTROABS 13.1*  --   --   --   --   --   --   HGB 16.6   < > 13.3 13.1 10.2* 11.1* 11.2*  HCT 51.2   < > 39.0 41.1 30.0* 32.1* 32.6*  MCV 96.4  --   --  99.5  --  93.0 93.1  PLT 170  --   --  127*  --  110* 101*   < > = values in this interval not displayed.     Basic Metabolic Panel: Recent Labs  Lab 11/23/21 1530 11/23/21 1538 11/23/21 1638 11/23/21 2000 11/24/21 0455 11/24/21 0612 11/25/21 0127 11/26/21 0206  NA 141 142   141   < > 140 139 139 134* 136  K 5.3* 5.4*   5.4*   < > 4.4 3.6 3.8 3.5 3.2*  CL 109 113*  --  105  --  107 107 107  CO2 14*  --   --  18*  --  20* 19* 19*  GLUCOSE 104* 98  --  61*  --  118* 106* 85  BUN 82* 96*  --  79*  --  81* 67* 51*  CREATININE 4.24* 3.70*  --  3.61*  --  3.10* 2.03* 1.51*  CALCIUM 9.4  --   --  8.8*  --  8.1* 7.8* 8.0*  MG  --   --   --   --   --  1.7 2.1  --   PHOS  --   --   --   --   --  2.7 2.6  --    < > = values in this interval not displayed.    GFR: Estimated Creatinine Clearance: 39.9 mL/min (A) (by C-G formula based on SCr of 1.51 mg/dL (H)). Recent Labs  Lab 11/23/21 1530 11/23/21 1730 11/23/21 2000 11/24/21 0612 11/26/21 0206  WBC 13.9*  --  17.0* 14.6* 8.4  LATICACIDVEN 7.4* 6.0*  --   --   --      Liver Function Tests: Recent Labs  Lab 11/23/21 1530  AST 34  ALT 14  ALKPHOS 124  BILITOT 1.0  PROT 7.2  ALBUMIN 3.3*    No results for input(s): LIPASE, AMYLASE in the last 168 hours. No results for input(s): AMMONIA in the last 168 hours.  ABG    Component Value Date/Time   PHART 7.607 (HH) 11/24/2021 0455   PCO2ART 18.3 (LL) 11/24/2021 0455   PO2ART 116 (H) 11/24/2021 0455   HCO3 18.3 (L) 11/24/2021 0455   TCO2 19 (L) 11/24/2021 0455   ACIDBASEDEF 2.0 11/24/2021 0455   O2SAT 99.0 11/24/2021 0455      Coagulation Profile: Recent Labs  Lab 11/23/21 1530  INR 1.3*     Cardiac Enzymes: No  results for input(s): CKTOTAL, CKMB, CKMBINDEX, TROPONINI in the last 168 hours.  HbA1C: Hgb A1c MFr Bld  Date/Time Value Ref Range Status  04/16/2018 02:49 PM 5.6 4.6 - 6.5 % Final    Comment:    Glycemic Control Guidelines for People with Diabetes:Non Diabetic:  <  6%Goal of Therapy: <7%Additional Action Suggested:  >8%   10/12/2015 02:04 PM 5.4 4.6 - 6.5 % Final    Comment:    Glycemic Control Guidelines for People with Diabetes:Non Diabetic:  <6%Goal of Therapy: <7%Additional Action Suggested:  >8%     CBG: Recent Labs  Lab 11/25/21 1647 11/25/21 1936 11/26/21 0004 11/26/21 0405 11/26/21 0737  GLUCAP 77 77 74 82 93     Review of Systems:   Unable to be obtained due to intubated, sedated.  Past Medical History:  He,  has a past medical history of Anxiety, Arthritis, Coronary artery disease, Erectile dysfunction, Fall (01/17/2021), Frozen shoulder, GERD (gastroesophageal reflux disease), Guillain Barr syndrome ([place]) (2009), History of elevated lipids, Hypertension, Osteoarthritis, and Spinal stenosis (11/24/2014).   Surgical History:   Past Surgical History:  Procedure Laterality Date   CARDIAC SURGERY     2 stents placed    CORONARY ANGIOPLASTY     mid and proximal LAD stent 09/1999 [facility])   posterior lumbar fusion L4-5  11-24-14     Social History:   reports that he quit smoking about 47 years ago. His smoking use included cigarettes. He has a 5.00 pack-year smoking history. He has never used smokeless tobacco. He reports current alcohol use. He reports that he does not use drugs.   Family History:  His family history includes Heart Problems in his father; Heart disease in his father; Sudden death in his father.   Allergies No Known Allergies   Home Medications  Prior to Admission medications   Medication Sig Start Date End Date Taking? Authorizing Provider  aspirin 81 MG chewable tablet Chew 1 tablet (81 mg total) by mouth daily. 02/06/21    Medina-Vargas, Monina C, NP  calcium carbonate (TUMS EX) 750 MG chewable tablet Chew 1 tablet (750 mg total) by mouth 3 (three) times daily. Patient taking differently: Chew 1 tablet by mouth 3 (three) times daily as needed for heartburn (acid reflux). 02/06/21   Medina-Vargas, Monina C, NP  cephALEXin (KEFLEX) 500 MG capsule Take 1 capsule (500 mg total) by mouth 4 (four) times daily. 09/17/21   Godfrey Pick, MD  feeding supplement (ENSURE ENLIVE / ENSURE PLUS) LIQD Take 237 mLs by mouth 3 (three) times daily between meals. 02/21/21   Florencia Reasons, MD  Multiple Vitamin (MULTIVITAMIN WITH MINERALS) TABS tablet Take 1 tablet by mouth daily. 02/22/21   Florencia Reasons, MD  senna-docusate (SENOKOT-S) 8.6-50 MG tablet Take 1 tablet by mouth at bedtime. 02/21/21   Florencia Reasons, MD     Critical care time: n/a      Lanier Clam, MD 11/26/21 9:12 AM [facility]

## 2021-11-26 NOTE — Progress Notes (Signed)
[facility] ADULT ICU REPLACEMENT PROTOCOL   The patient does apply for the [facility] Adult ICU Electrolyte Replacment Protocol based on the criteria listed below:   1.Exclusion criteria: TCTS patients, ECMO patients, and Dialysis patients 2. Is GFR >/= 30 ml/min? Yes.    Patient's GFR today is 47 3. Is SCr </= 2? Yes.   Patient's SCr is 1.51 mg/dL 4. Did SCr increase >/= 0.5 in 24 hours? No. 5.Pt's weight >40kg  Yes.   6. Abnormal electrolyte(s): K+ 3.2  7. Electrolytes replaced per protocol 8.  Call MD STAT for K+ </= 2.5, Phos </= 1, or Mag </= 1 Physician:  Dr Rebeca Allegra, Elfredia Nevins 11/26/2021 5:46 AM

## 2021-11-26 NOTE — Progress Notes (Signed)
Patient examined again in afternoon. Now able to lift left arm. CT head negative. Low suspicion for stroke. MRI brain cancelled. Patient unable to lie flat enough for MRI, anyway.

## 2021-11-27 LAB — COMPREHENSIVE METABOLIC PANEL
ALT: 27 U/L (ref 0–44)
AST: 28 U/L (ref 15–41)
Albumin: 2 g/dL — ABNORMAL LOW (ref 3.5–5.0)
Alkaline Phosphatase: 46 U/L (ref 38–126)
Anion gap: 5 (ref 5–15)
BUN: 35 mg/dL — ABNORMAL HIGH (ref 8–23)
CO2: 20 mmol/L — ABNORMAL LOW (ref 22–32)
Calcium: 7.9 mg/dL — ABNORMAL LOW (ref 8.9–10.3)
Chloride: 110 mmol/L (ref 98–111)
Creatinine, Ser: 1.33 mg/dL — ABNORMAL HIGH (ref 0.61–1.24)
GFR, Estimated: 54 mL/min — ABNORMAL LOW (ref >60)
Glucose, Bld: 107 mg/dL — ABNORMAL HIGH (ref 70–99)
Potassium: 3.6 mmol/L (ref 3.5–5.1)
Sodium: 135 mmol/L (ref 135–145)
Total Bilirubin: 0.8 mg/dL (ref 0.3–1.2)
Total Protein: 5.4 g/dL — ABNORMAL LOW (ref 6.5–8.1)

## 2021-11-27 LAB — CBC WITH DIFFERENTIAL/PLATELET
Abs Immature Granulocytes: 0.09 10*3/uL — ABNORMAL HIGH (ref 0.00–0.07)
Basophils Absolute: 0 10*3/uL (ref 0.0–0.1)
Basophils Relative: 0 %
Eosinophils Absolute: 0.2 10*3/uL (ref 0.0–0.5)
Eosinophils Relative: 3 %
HCT: 32.2 % — ABNORMAL LOW (ref 39.0–52.0)
Hemoglobin: 10.5 g/dL — ABNORMAL LOW (ref 13.0–17.0)
Immature Granulocytes: 1 %
Lymphocytes Relative: 10 %
Lymphs Abs: 0.8 10*3/uL (ref 0.7–4.0)
MCH: 30.7 pg (ref 26.0–34.0)
MCHC: 32.6 g/dL (ref 30.0–36.0)
MCV: 94.2 fL (ref 80.0–100.0)
Monocytes Absolute: 0.9 10*3/uL (ref 0.1–1.0)
Monocytes Relative: 11 %
Neutro Abs: 6.3 10*3/uL (ref 1.7–7.7)
Neutrophils Relative %: 75 %
Platelets: 103 10*3/uL — ABNORMAL LOW (ref 150–400)
RBC: 3.42 MIL/uL — ABNORMAL LOW (ref 4.22–5.81)
RDW: 14 % (ref 11.5–15.5)
WBC: 8.4 10*3/uL (ref 4.0–10.5)
nRBC: 0 % (ref 0.0–0.2)

## 2021-11-27 LAB — GLUCOSE, CAPILLARY
Glucose-Capillary: 100 mg/dL — ABNORMAL HIGH (ref 70–99)
Glucose-Capillary: 101 mg/dL — ABNORMAL HIGH (ref 70–99)
Glucose-Capillary: 102 mg/dL — ABNORMAL HIGH (ref 70–99)
Glucose-Capillary: 84 mg/dL (ref 70–99)
Glucose-Capillary: 91 mg/dL (ref 70–99)
Glucose-Capillary: 95 mg/dL (ref 70–99)

## 2021-11-27 LAB — MAGNESIUM: Magnesium: 1.7 mg/dL (ref 1.7–2.4)

## 2021-11-27 MED ORDER — ENSURE ENLIVE PO LIQD
237.0000 mL | Freq: Three times a day (TID) | ORAL | Status: DC
  Administered 2021-11-27 – 2021-11-30 (×9): 237 mL via ORAL

## 2021-11-27 MED ORDER — POTASSIUM CHLORIDE 10 MEQ/100ML IV SOLN: 10.0000 meq | INTRAVENOUS | Status: DC

## 2021-11-27 MED ORDER — MAGNESIUM SULFATE 2 GM/50ML IV SOLN
2.0000 g | Freq: Once | INTRAVENOUS | Status: AC
  Administered 2021-11-27: 06:00:00 2 g via INTRAVENOUS
  Filled 2021-11-27: qty 50

## 2021-11-27 MED ORDER — POTASSIUM CHLORIDE 20 MEQ PO PACK
40.0000 meq | PACK | Freq: Once | ORAL | Status: AC
  Administered 2021-11-27: 09:00:00 40 meq via ORAL
  Filled 2021-11-27: qty 2

## 2021-11-27 MED ORDER — SODIUM CHLORIDE 0.9 % IV SOLN: INTRAVENOUS | Status: DC

## 2021-11-27 MED ORDER — POTASSIUM CHLORIDE CRYS ER 20 MEQ PO TBCR
40.0000 meq | EXTENDED_RELEASE_TABLET | Freq: Once | ORAL | Status: DC
  Filled 2021-11-27: qty 2

## 2021-11-27 MED ORDER — ADULT MULTIVITAMIN W/MINERALS CH
1.0000 | ORAL_TABLET | Freq: Every day | ORAL | Status: DC
Start: 2021-11-28 — End: 2021-11-30
  Administered 2021-11-28 – 2021-11-30 (×3): 1 via ORAL
  Filled 2021-11-27 (×3): qty 1

## 2021-11-27 NOTE — Progress Notes (Signed)
Nutrition Follow-up  DOCUMENTATION CODES:   Severe malnutrition in context of chronic illness  INTERVENTION:   - Downgrade diet to dysphagia 3 for ease of intake  - Ensure Enlive po TID, each supplement provides 350 kcal and 20 grams of protein  - MVI with minerals daily  - Encourage PO intake and provide feeding assistance as needed  NUTRITION DIAGNOSIS:   Severe Malnutrition related to chronic illness (GBS) as evidenced by severe fat depletion, severe muscle depletion.  New diagnosis after completion of NFPE  GOAL:   Patient will meet greater than or equal to 90% of their needs  Progressing  MONITOR:   PO intake, Supplement acceptance, Labs, Weight trends, Skin, I & O's  REASON FOR ASSESSMENT:   Ventilator, Consult Enteral/tube feeding initiation and management  ASSESSMENT:   80 yo male admitted from [facility] with AMS and fever with septic shock from UTI with AKI, required intubation on admission. PMH includes CAD, GERD, HTN, GBS.  12/17 - trickle tube feeds started 12/18 - extubated 12/19 - dysphagia 3 diet 12/20 - regular diet  Discussed pt with RN and during ICU rounds. Pt currently on a regular diet. PO intake has been variable, 30-85%. Spoke with pt at bedside. Pt requesting food to come already cut up. Will downgrade diet to dysphagia 3 for ease of intake. Pt states that he is willing to consume oral nutrition supplements. RD to order Ensure Enlive. Will also order daily MVI with minerals.  Pt with mild pitting edema to RUE and BLE and with deep pitting edema to LUE. Reviewed weight history in chart. Pt's weight has been fairly stable between 71-73 kg over the last 10 months. However, suspect current weight is falsely elevated related to edema. Unsure of pt's dry weight at this time. Noted current weight is down ~10 kg compared to weights from 2019. Based on NFPE, pt meets criteria for severe malnutrition. Discussed importance of adequate PO intake with  pt who agreed.  Meal Completion: 30-85%  Medications reviewed and include: IV abx IVF: NS @ 75 ml/hr  Labs reviewed: BUN 35, creatinine 1.33, platelets 103 CBG's: 84-131 x 24 hours  UOP: 2050 ml x 24 hours I/O's: +3.2 L since admit  NUTRITION - FOCUSED PHYSICAL EXAM:  Flowsheet Row Most Recent Value  Orbital Region Severe depletion  Upper Arm Region Severe depletion  Thoracic and Lumbar Region Severe depletion  Buccal Region Moderate depletion  Temple Region Severe depletion  Clavicle Bone Region Severe depletion  Clavicle and Acromion Bone Region Severe depletion  Scapular Bone Region Unable to assess  Dorsal Hand Moderate depletion  Patellar Region Severe depletion  Anterior Thigh Region Severe depletion  Posterior Calf Region Severe depletion  Edema (RD Assessment) Mild  [BLE]  Hair Reviewed  Eyes Reviewed  Mouth Reviewed  Skin Reviewed  Nails Reviewed       Diet Order:   Diet Order             DIET DYS 3 Room service appropriate? Yes; Fluid consistency: Thin  Diet effective now                   EDUCATION NEEDS:   Education needs have been addressed  Skin:  Skin Assessment: Skin Integrity Issues: Stage I: bilateral heels  Last BM:  11/26/21 type 7  Height:   Ht Readings from Last 1 Encounters:  11/23/21 5\' 10"  (1.778 m)    Weight:   Wt Readings from Last 1 Encounters:  11/25/21  71.2 kg    BMI:  Body mass index is 22.52 kg/m.  Estimated Nutritional Needs:   Kcal:  2000-2200 kcals  Protein:  100-120 grams  Fluid:  >/= 2 L    Gustavus Bryant, MS, RD, LDN Inpatient Clinical Dietitian Please see AMiON for contact information.

## 2021-11-27 NOTE — Progress Notes (Signed)
[facility] ADULT ICU REPLACEMENT PROTOCOL   The patient does apply for the [facility] Adult ICU Electrolyte Replacment Protocol based on the criteria listed below:   1.Exclusion criteria: TCTS patients, ECMO patients, and Dialysis patients 2. Is GFR >/= 30 ml/min? Yes.    Patient's GFR today is 54 3. Is SCr </= 2? Yes.   Patient's SCr is 1.33 mg/dL 4. Did SCr increase >/= 0.5 in 24 hours? No. 5.Pt's weight >40kg  Yes.   6. Abnormal electrolyte(s): K+ 3.6, mag 1.7  7. Electrolytes replaced per protocol 8.  Call MD STAT for K+ </= 2.5, Phos </= 1, or Mag </= 1 Physician:  n/a  Clinton [name] 11/27/2021 3:01 AM

## 2021-11-27 NOTE — Progress Notes (Signed)
PROGRESS NOTE  Clinton [name]  DOB: 1941-05-25  PCP: [name], NP PXT:[id number]  DOA: 11/23/2021  LOS: 4 days  Hospital Day: 5  Chief Complaint  Patient presents with   Altered Mental Status    Brief narrative: Clinton [name] is a 80 y.o. male with PMH significant for HTN, HLD, CAD, GERD, anxiety, spinal stenosis, arthritis, history of GBS after a flu vaccine. Patient was brought to the ED from [place] on 12/16 for altered mental status and fever.  EMS found him unresponsive and cyanotic.  They brought him to ED with BVM, subsequently intubated in the ED. In the ED, Labs showed creatinine elevated to 4.24, WBC count elevated to 14.6, lactic acid up to 7.4. Urinalysis with yellow hazy urine with positive nitrite, large amount of leukocytes and many bacteria He was started on broad-spectrum antibiotics, fluids, pressors and at septic shock.   Urine culture sent on admission grew Providencia stuartii and Proteus mirabilis. With clinical improvement, patient was transferred out to [facility] service on 12/20.  Subjective: Patient was seen and examined this morning.  Elderly Caucasian male.  Head chronically tilted towards left.  Oriented to place and person.  Seems chronically sick. Chart reviewed T-max 100.2 from last night, respiratory rate in 20s,oxygen saturation 100% on room air, blood pressure in 120s Last set of labs from last night with creatinine 1.33, hemoglobin 10.5, WBC normal  Assessment/Plan: Septic shock due to complicated UTI  Associated with chronic Foley catheter -Urine culture grew Providencia stuartii and proteus mirabilis. -Based on sensitivity pattern, currently on IV Rocephin till 12/26.  -Off pressors. Hemodynamically stable. -WBC count normalized.  Repeat lactic acid level tomorrow Recent Labs  Lab 11/23/21 1530 11/23/21 1730 11/23/21 2000 11/24/21 0612 11/26/21 0206 11/27/21 0108  WBC 13.9*  --  17.0* 14.6* 8.4 8.4  LATICACIDVEN 7.4*  6.0*  --   --   --   --     Acute septic, metabolic encephalopathy  -Mental status normalized.  Acute respiratory failure with hypoxia  -due to sepsis.  Intubated on admission, extubated 12/18.  Currently on room air  AKI on CKD2 Acute metabolic acidosis -Baseline creatinine 1.05 from October 2022 -Presented with creatinine elevated to 4.24, due to sepsis.   -Creatinine gradually improving with IV fluid, close to baseline now. -Serum bicarb level improving as well with improvement in renal function. Recent Labs    02/17/21 0119 02/18/21 0330 02/21/21 0140 09/17/21 1615 11/23/21 1530 11/23/21 1538 11/23/21 2000 11/24/21 0612 11/25/21 0127 11/26/21 0206 11/27/21 0108  BUN 14 12 15  31* 82* 96* 79* 81* 67* 51* 35*  CREATININE 1.16 1.17 1.24 1.05 4.24* 3.70* 3.61* 3.10* 2.03* 1.51* 1.33*  CO2 25 23 23 26  14*  --  18* 20* 19* 19* 20*   Hypokalemia -Improved with replacement.  Continue to monitor Recent Labs  Lab 11/24/21 0455 11/24/21 0612 11/25/21 0127 11/26/21 0206 11/27/21 0108  K 3.6 3.8 3.5 3.2* 3.6  MG  --  1.7 2.1  --  1.7  PHOS  --  2.7 2.6  --   --    Chronic Foley catheter in place -Probably due to poor mobility.  Transient left-sided weakness On 12/18, he was noted to have asymmetric left-sided weakness.  CT scan of head was normal.  Symptoms resolved in 24 hours.   History of essential hypertension Grade 1 diastolic dysfunction -Not on any blood pressure meds.  Blood pressure is currently normal range  CAD/HLD -Was on aspirin 81 mg daily  at home -Not sure why patient was not on statin.  history of GBS  -following a flu vaccine, unclear when  Mobility: PT eval ordered  Living condition: [facility] facility Goals of care:   Code Status: Partial Code.  Okay with intubation but no compression per record Nutritional status: Body mass index is 22.52 kg/m.  Nutrition Problem: Inadequate oral intake Etiology: acute illness Signs/Symptoms:  NPO status Diet:  Diet Order             DIET DYS 3 Room service appropriate? Yes; Fluid consistency: Thin  Diet effective now                  DVT prophylaxis:  heparin injection 5,000 Units Start: 11/23/21 2200   Antimicrobials: IV Rocephin Fluid: None Consultants: PCCM Family Communication: None at bedside  Status is: Inpatient  Continue in-hospital care because: Improving sepsis Level of care: Telemetry Medical   Dispo: The patient is from: Nursing home              Anticipated d/c is to: Back to nursing home in 2 to 3 days              Patient currently is not medically stable to d/c.   Difficult to place patient No     Infusions:   sodium chloride Stopped (11/27/21 0555)   sodium chloride 75 mL/hr at 11/27/21 0859   cefTRIAXone (ROCEPHIN)  IV 1 g (11/27/21 0915)    Scheduled Meds:  Chlorhexidine Gluconate Cloth  6 each Topical Q0600   heparin  5,000 Units Subcutaneous Q8H   mouth rinse  15 mL Mouth Rinse BID    PRN meds: acetaminophen (TYLENOL) oral liquid 160 mg/5 mL, [COMPLETED] loperamide **FOLLOWED BY** loperamide, ondansetron (ZOFRAN) IV, polyethylene glycol   Antimicrobials: Anti-infectives (From admission, onward)    Start     Dose/Rate Route Frequency Ordered Stop   11/26/21 1200  cefTRIAXone (ROCEPHIN) 1 g in sodium chloride 0.9 % 100 mL IVPB        1 g 200 mL/hr over 30 Minutes Intravenous Daily 11/26/21 0756     11/25/21 1315  ceFEPIme (MAXIPIME) 2 g in sodium chloride 0.9 % 100 mL IVPB  Status:  Discontinued        2 g 200 mL/hr over 30 Minutes Intravenous Every 12 hours 11/25/21 1302 11/26/21 0756   11/25/21 0800  vancomycin (VANCOREADY) IVPB 750 mg/150 mL  Status:  Discontinued        750 mg 150 mL/hr over 60 Minutes Intravenous Every 24 hours 11/25/21 0720 11/25/21 1611   11/24/21 1600  ceFEPIme (MAXIPIME) 2 g in sodium chloride 0.9 % 100 mL IVPB  Status:  Discontinued        2 g 200 mL/hr over 30 Minutes Intravenous Every 24 hours  11/23/21 1557 11/25/21 1302   11/23/21 1556  vancomycin variable dose per unstable renal function (pharmacist dosing)  Status:  Discontinued         Does not apply See admin instructions 11/23/21 1557 11/25/21 0722   11/23/21 1545  ceFEPIme (MAXIPIME) 2 g in sodium chloride 0.9 % 100 mL IVPB        2 g 200 mL/hr over 30 Minutes Intravenous  Once 11/23/21 1532 11/23/21 1604   11/23/21 1545  metroNIDAZOLE (FLAGYL) IVPB 500 mg        500 mg 100 mL/hr over 60 Minutes Intravenous  Once 11/23/21 1532 11/23/21 1704   11/23/21 1545  vancomycin (VANCOREADY)  IVPB 1500 mg/300 mL        1,500 mg 150 mL/hr over 120 Minutes Intravenous  Once 11/23/21 1532 11/23/21 1841       Objective: Vitals:   11/27/21 1115 11/27/21 1116  BP: (!) 145/79   Pulse:  63  Resp:  (!) 25  Temp:    SpO2:  97%    Intake/Output Summary (Last 24 hours) at 11/27/2021 1156 Last data filed at 11/27/2021 0600 Gross per 24 hour  Intake 2324.5 ml  Output 2050 ml  Net 274.5 ml   Filed Weights   11/23/21 2016 11/25/21 0500  Weight: 71.4 kg 71.2 kg   Weight change:  Body mass index is 22.52 kg/m.   Physical Exam: General exam: Elderly Caucasian male.  Chronically sick and debilitated looking Skin: No rashes, lesions or ulcers. HEENT: Atraumatic, normocephalic, no obvious bleeding Lungs: Diminished air entry in both bases. CVS: Regular rate and rhythm, no murmur GI/Abd soft, nontender, nondistended, bowel sound present CNS: Alert, awake, able to have some conversation.  Oriented to place and person Psychiatry: Sad affect Extremities: No pedal edema, no calf tenderness  Data Review: I have personally reviewed the laboratory data and studies available.  F/u labs ordered Unresulted Labs (From admission, onward)     Start     Ordered   Unscheduled  CBC with Differential/Platelet  Daily,   R     Question:  Specimen collection method  Answer:  Lab=Lab collect   11/27/21 1156   Unscheduled  Basic metabolic panel   Daily,   R     Question:  Specimen collection method  Answer:  Lab=Lab collect   11/27/21 1156            Signed, Terrilee Croak, MD [facility] 11/27/2021

## 2021-11-27 NOTE — Evaluation (Signed)
Physical Therapy Evaluation/ Discharge Patient Details Name: Clinton [name] MRN: [id number] DOB: 1941-02-03 Today's Date: 11/27/2021  History of Present Illness  80 yo admitted from [facility] 12/16 with AMS and fever, hypoxic on EMS arrival an intubated 12/16-12/18. Pt with septic shock due to UTI. 12/18 LUE weakness with head CT (-). PMhx: CAD, HTN, GERD, spinal stenosis, chronic foley  Clinical Impression  Pt extremely rigid in all aspects on arrival with very limited PROM of bil UE and LE. Pt with hip adduction contracture making pericare difficult to fully clean and pt with left neck flexion/rotation contracture. Pt long term resident from [facility] total care at baseline and presents as the same. No acute therapy needs with nursing performing rotation for skin care. Will sign off.        Recommendations for follow up therapy are one component of a multi-disciplinary discharge planning process, led by the attending physician.  Recommendations may be updated based on patient status, additional functional criteria and insurance authorization.  Follow Up Recommendations Long-term institutional care without follow-up therapy    Assistance Recommended at Discharge Frequent or constant Supervision/Assistance  Functional Status Assessment Patient has not had a recent decline in their functional status  Equipment Recommendations  None recommended by PT    Recommendations for Other Services       Precautions / Restrictions Precautions Precautions: Fall;Other (comment) Precaution Comments: bil LE contractures, bil hand contractures, left neck rotation/flexion contracture, chronic foley      Mobility  Bed Mobility Overal bed mobility: Needs Assistance Bed Mobility: Rolling Rolling: Total assist         General bed mobility comments: total assist for bil rolling without pt demonstrating significant movement. Total assist for pericare and total +2 to slide toward [facility]     Transfers                   General transfer comment: unable    Ambulation/Gait                  Stairs            Wheelchair Mobility    Modified Rankin (Stroke Patients Only)       Balance                                             Pertinent Vitals/Pain Pain Assessment: PAINAD Breathing: normal Negative Vocalization: occasional moan/groan, low speech, negative/disapproving quality Facial Expression: sad, frightened, frown Body Language: tense, distressed pacing, fidgeting Consolability: no need to console PAINAD Score: 3    Home Living Family/patient expects to be discharged to:: Skilled nursing facility                        Prior Function Prior Level of Function : Needs assist  Cognitive Assist : Mobility (cognitive);ADLs (cognitive)           Mobility Comments: total assist to roll and reposition. Pt reports 2 person assist or lift for OOB. Pt initially stating he can walk but then unable to state when the last time he walked was ADLs Comments: pt reports bed or chair bathes with total assist     Hand Dominance        Extremity/Trunk Assessment   Upper Extremity Assessment Upper Extremity Assessment: RUE deficits/detail;LUE deficits/detail RUE Deficits / Details: no  active ROM noted with pt rigid with digit contractures. ROM at shoulder grossly 40 degree PROM LUE Deficits / Details: no active ROM noted with pt rigid with digit contractures. ROM at shoulder grossly 40 degree PROM    Lower Extremity Assessment Lower Extremity Assessment: RLE deficits/detail;LLE deficits/detail RLE Deficits / Details: PROm knee flexion grossly 20 degrees with increase effort due to rigidity, hip flexion grossly 20 degrees LLE Deficits / Details: PROm knee flexion grossly 20 degrees with increase effort due to rigidity, hip flexion grossly 20 degrees    Cervical / Trunk Assessment Cervical / Trunk Assessment: Other  exceptions Cervical / Trunk Exceptions: pt with left neck flexion and rotation contracture only. With passive stretch unable to achieve midline  Communication   Communication: No difficulties  Cognition Arousal/Alertness: Awake/alert Behavior During Therapy: Flat affect Overall Cognitive Status: Impaired/Different from baseline Area of Impairment: Orientation;Memory;Following commands;Safety/judgement;Awareness;Problem solving                 Orientation Level: Disoriented to;Time;Situation   Memory: Decreased short-term memory       Problem Solving: Slow processing;Requires verbal cues;Requires tactile cues General Comments: pt asking if he can donate his brain as a transplant when he dies. Pt unable to demonstrate following any commands with extremely ridid and contracted body        General Comments      Exercises     Assessment/Plan    PT Assessment Patient does not need any further PT services  PT Problem List         PT Treatment Interventions      PT Goals (Current goals can be found in the Care Plan section)  Acute Rehab PT Goals PT Goal Formulation: All assessment and education complete, DC therapy    Frequency     Barriers to discharge        Co-evaluation               AM-PAC PT "6 Clicks" Mobility  Outcome Measure Help needed turning from your back to your side while in a flat bed without using bedrails?: Total Help needed moving from lying on your back to sitting on the side of a flat bed without using bedrails?: Total Help needed moving to and from a bed to a chair (including a wheelchair)?: Total Help needed standing up from a chair using your arms (e.g., wheelchair or bedside chair)?: Total Help needed to walk in hospital room?: Total Help needed climbing 3-5 steps with a railing? : Total 6 Click Score: 6    End of Session   Activity Tolerance: Patient limited by pain;Patient limited by fatigue Patient left: in bed;with call  bell/phone within reach Nurse Communication: Mobility status;Need for lift equipment PT Visit Diagnosis: Muscle weakness (generalized) (M62.81)    Time: 9242-6834 PT Time Calculation (min) (ACUTE ONLY): 19 min   Charges:   PT Evaluation $PT Eval Moderate Complexity: [place], PT Acute Rehabilitation Services Pager: [phone] Office: [phone]   Sandy Salaam Gorgeous [place] 11/27/2021, 12:32 PM

## 2021-11-28 LAB — CULTURE, BLOOD (ROUTINE X 2)
Culture: NO GROWTH
Culture: NO GROWTH
Special Requests: ADEQUATE
Special Requests: ADEQUATE

## 2021-11-28 LAB — CBC WITH DIFFERENTIAL/PLATELET
Abs Immature Granulocytes: 0.21 10*3/uL — ABNORMAL HIGH (ref 0.00–0.07)
Basophils Absolute: 0.1 10*3/uL (ref 0.0–0.1)
Basophils Relative: 1 %
Eosinophils Absolute: 0.3 10*3/uL (ref 0.0–0.5)
Eosinophils Relative: 3 %
HCT: 35 % — ABNORMAL LOW (ref 39.0–52.0)
Hemoglobin: 11.7 g/dL — ABNORMAL LOW (ref 13.0–17.0)
Immature Granulocytes: 2 %
Lymphocytes Relative: 12 %
Lymphs Abs: 1 10*3/uL (ref 0.7–4.0)
MCH: 30.9 pg (ref 26.0–34.0)
MCHC: 33.4 g/dL (ref 30.0–36.0)
MCV: 92.3 fL (ref 80.0–100.0)
Monocytes Absolute: 0.9 10*3/uL (ref 0.1–1.0)
Monocytes Relative: 11 %
Neutro Abs: 6.1 10*3/uL (ref 1.7–7.7)
Neutrophils Relative %: 71 %
Platelets: 115 10*3/uL — ABNORMAL LOW (ref 150–400)
RBC: 3.79 MIL/uL — ABNORMAL LOW (ref 4.22–5.81)
RDW: 14.1 % (ref 11.5–15.5)
WBC: 8.6 10*3/uL (ref 4.0–10.5)
nRBC: 0 % (ref 0.0–0.2)

## 2021-11-28 LAB — BASIC METABOLIC PANEL
Anion gap: 6 (ref 5–15)
BUN: 23 mg/dL (ref 8–23)
CO2: 19 mmol/L — ABNORMAL LOW (ref 22–32)
Calcium: 8.1 mg/dL — ABNORMAL LOW (ref 8.9–10.3)
Chloride: 111 mmol/L (ref 98–111)
Creatinine, Ser: 1.05 mg/dL (ref 0.61–1.24)
GFR, Estimated: >60 mL/min (ref >60)
Glucose, Bld: 89 mg/dL (ref 70–99)
Potassium: 4.4 mmol/L (ref 3.5–5.1)
Sodium: 136 mmol/L (ref 135–145)

## 2021-11-28 LAB — GLUCOSE, CAPILLARY
Glucose-Capillary: 78 mg/dL (ref 70–99)
Glucose-Capillary: 94 mg/dL (ref 70–99)
Glucose-Capillary: 97 mg/dL (ref 70–99)
Glucose-Capillary: 82 mg/dL (ref 70–99)
Glucose-Capillary: 101 mg/dL — ABNORMAL HIGH (ref 70–99)
Glucose-Capillary: 64 mg/dL — ABNORMAL LOW (ref 70–99)

## 2021-11-28 LAB — RESP PANEL BY RT-PCR (FLU A&B, COVID) ARPGX2
Influenza A by PCR: NEGATIVE
Influenza B by PCR: NEGATIVE
SARS Coronavirus 2 by RT PCR: NEGATIVE

## 2021-11-28 MED ORDER — DEXTROSE 50 % IV SOLN
INTRAVENOUS | Status: AC
  Filled 2021-11-28: qty 50

## 2021-11-28 MED ORDER — DEXTROSE 50 % IV SOLN: 12.5000 g | INTRAVENOUS | Status: AC

## 2021-11-28 NOTE — Progress Notes (Signed)
PROGRESS NOTE  [name]  DOB: 12-05-1941  PCP: [name], NP ZDG:[id number]  DOA: 11/23/2021  LOS: 5 days  Hospital Day: 6  Chief Complaint  Patient presents with   Altered Mental Status    Brief narrative: Alonte Wulff is a 80 y.o. male with PMH significant for HTN, HLD, CAD, GERD, anxiety, spinal stenosis, arthritis, history of GBS after a flu vaccine. Patient was brought to the ED from [facility] on 12/16 for altered mental status and fever.  EMS found him unresponsive and cyanotic.  They brought him to ED with BVM, subsequently intubated in the ED. In the ED, Labs showed creatinine elevated to 4.24, WBC count elevated to 14.6, lactic acid up to 7.4. Urinalysis with yellow hazy urine with positive nitrite, large amount of leukocytes and many bacteria He was started on broad-spectrum antibiotics, fluids, pressors and at septic shock.   Urine culture sent on admission grew Providencia stuartii and Proteus mirabilis. With clinical improvement, patient was transferred out to [facility] service on 12/20.  Subjective: Patient was seen and examined this morning.   Elderly Caucasian male.  Head chronically tilted towards left.   Oriented to place and person.  Seems chronically sick. Chart reviewed No fever in last 24 hours.  Assessment/Plan: Septic shock due to complicated UTI  Associated with chronic Foley catheter -Urine culture grew Providencia stuartii and proteus mirabilis. -Based on sensitivity pattern, currently on IV Rocephin.  Discussed with pharmacy.  Patient is due for last dose of IV Rocephin tomorrow 12/22. -Off pressors. Hemodynamically stable. -WBC count normalized.  Repeat lactic acid level tomorrow Recent Labs  Lab 11/23/21 1530 11/23/21 1730 11/23/21 2000 11/24/21 0612 11/26/21 0206 11/27/21 0108 11/28/21 0257  WBC 13.9*  --  17.0* 14.6* 8.4 8.4 8.6  LATICACIDVEN 7.4* 6.0*  --   --   --   --   --     Acute septic, metabolic  encephalopathy  -Mental status normalized.  Acute respiratory failure with hypoxia  -due to sepsis.  Intubated on admission, extubated 12/18.  Currently on room air.  AKI on CKD2 Acute metabolic acidosis -Baseline creatinine 1.05 from October 2022 -Presented with creatinine elevated to 4.24, due to sepsis.   -Creatinine gradually improving with IV fluid, close to baseline now. -Serum bicarb level improving as well with improvement in renal function. Recent Labs    02/18/21 0330 02/21/21 0140 09/17/21 1615 11/23/21 1530 11/23/21 1538 11/23/21 2000 11/24/21 0612 11/25/21 0127 11/26/21 0206 11/27/21 0108 11/28/21 0501  BUN 12 15 31* 82* 96* 79* 81* 67* 51* 35* 23  CREATININE 1.17 1.24 1.05 4.24* 3.70* 3.61* 3.10* 2.03* 1.51* 1.33* 1.05  CO2 23 23 26  14*  --  18* 20* 19* 19* 20* 19*   Hypokalemia -Improved with replacement.  Continue to monitor Recent Labs  Lab 11/24/21 0612 11/25/21 0127 11/26/21 0206 11/27/21 0108 11/28/21 0501  K 3.8 3.5 3.2* 3.6 4.4  MG 1.7 2.1  --  1.7  --   PHOS 2.7 2.6  --   --   --    Chronic Foley catheter in place -Probably due to poor mobility.  Transient left-sided weakness On 12/18, he was noted to have asymmetric left-sided weakness.  CT scan of head was normal.  Symptoms resolved in 24 hours.   History of essential hypertension Grade 1 diastolic dysfunction -Not on any blood pressure meds.  Blood pressure is currently normal range  CAD/HLD -Was on aspirin 81 mg daily at home -Not sure why  patient was not on statin.  history of GBS  -following a flu vaccine, unclear when  Mobility: PT eval ordered  Living condition: [place] facility Goals of care:   Code Status: Partial Code.  Okay with intubation but no compression per record Nutritional status: Body mass index is 22.65 kg/m.  Nutrition Problem: Severe Malnutrition Etiology: chronic illness (GBS) Signs/Symptoms: severe fat depletion, severe muscle  depletion Diet:  Diet Order             DIET DYS 3 Room service appropriate? Yes with Assist; Fluid consistency: Thin  Diet effective now                  DVT prophylaxis:  heparin injection 5,000 Units Start: 11/23/21 2200   Antimicrobials: IV Rocephin till tomorrow Fluid: None Consultants: PCCM Family Communication: None at bedside  Status is: Inpatient  Continue in-hospital care because: Improving sepsis Level of care: Telemetry Medical   Dispo: The patient is from: Nursing home              Anticipated d/c is to: Back to nursing home tomorrow              Patient currently is not medically stable to d/c.   Difficult to place patient No     Infusions:   sodium chloride Stopped (11/28/21 0523)   sodium chloride 75 mL/hr at 11/28/21 0600   cefTRIAXone (ROCEPHIN)  IV 1 g (11/28/21 1121)    Scheduled Meds:  Chlorhexidine Gluconate Cloth  6 each Topical Q0600   feeding supplement  237 mL Oral TID BM   heparin  5,000 Units Subcutaneous Q8H   mouth rinse  15 mL Mouth Rinse BID   multivitamin with minerals  1 tablet Oral Daily    PRN meds: acetaminophen (TYLENOL) oral liquid 160 mg/5 mL, [COMPLETED] loperamide **FOLLOWED BY** loperamide, ondansetron (ZOFRAN) IV, polyethylene glycol   Antimicrobials: Anti-infectives (From admission, onward)    Start     Dose/Rate Route Frequency Ordered Stop   11/26/21 1200  cefTRIAXone (ROCEPHIN) 1 g in sodium chloride 0.9 % 100 mL IVPB        1 g 200 mL/hr over 30 Minutes Intravenous Daily 11/26/21 0756 11/29/21 2359   11/25/21 1315  ceFEPIme (MAXIPIME) 2 g in sodium chloride 0.9 % 100 mL IVPB  Status:  Discontinued        2 g 200 mL/hr over 30 Minutes Intravenous Every 12 hours 11/25/21 1302 11/26/21 0756   11/25/21 0800  vancomycin (VANCOREADY) IVPB 750 mg/150 mL  Status:  Discontinued        750 mg 150 mL/hr over 60 Minutes Intravenous Every 24 hours 11/25/21 0720 11/25/21 1611   11/24/21 1600  ceFEPIme (MAXIPIME) 2 g in  sodium chloride 0.9 % 100 mL IVPB  Status:  Discontinued        2 g 200 mL/hr over 30 Minutes Intravenous Every 24 hours 11/23/21 1557 11/25/21 1302   11/23/21 1556  vancomycin variable dose per unstable renal function (pharmacist dosing)  Status:  Discontinued         Does not apply See admin instructions 11/23/21 1557 11/25/21 0722   11/23/21 1545  ceFEPIme (MAXIPIME) 2 g in sodium chloride 0.9 % 100 mL IVPB        2 g 200 mL/hr over 30 Minutes Intravenous  Once 11/23/21 1532 11/23/21 1604   11/23/21 1545  metroNIDAZOLE (FLAGYL) IVPB 500 mg        500 mg  100 mL/hr over 60 Minutes Intravenous  Once 11/23/21 1532 11/23/21 1704   11/23/21 1545  vancomycin (VANCOREADY) IVPB 1500 mg/300 mL        1,500 mg 150 mL/hr over 120 Minutes Intravenous  Once 11/23/21 1532 11/23/21 1841       Objective: Vitals:   11/28/21 0756 11/28/21 1107  BP:    Pulse:    Resp:    Temp: 99.9 F (37.7 C) 98.2 F (36.8 C)  SpO2:      Intake/Output Summary (Last 24 hours) at 11/28/2021 1257 Last data filed at 11/28/2021 0843 Gross per 24 hour  Intake 2186.03 ml  Output 1985 ml  Net 201.03 ml   Filed Weights   11/23/21 2016 11/25/21 0500 11/28/21 0500  Weight: 71.4 kg 71.2 kg 71.6 kg   Weight change:  Body mass index is 22.65 kg/m.   Physical Exam: General exam: Elderly Caucasian male.  Chronically sick and debilitated looking.  Chronic Foley catheter in place Skin: No rashes, lesions or ulcers. HEENT: Atraumatic, normocephalic, no obvious bleeding Lungs: Diminished air entry in both bases. CVS: Regular rate and rhythm, no murmur GI/Abd soft, nontender, nondistended, bowel sound present CNS: Alert, awake, able to have some conversation.  Oriented to place and person Psychiatry: Sad affect Extremities: No pedal edema, no calf tenderness  Data Review: I have personally reviewed the laboratory data and studies available.  F/u labs ordered Unresulted Labs (From admission, onward)     Start      Ordered   11/29/21 0500  Lactic acid, plasma  Tomorrow morning,   R       Question:  Specimen collection method  Answer:  Lab=Lab collect   11/28/21 1257   11/28/21 1039  Resp Panel by RT-PCR (Flu A&B, Covid) Nasopharyngeal Swab  (Tier 2 - Symptomatic/asymptomatic)  Once,   R        11/28/21 1038   11/28/21 0500  CBC with Differential/Platelet  Daily,   R     Question:  Specimen collection method  Answer:  Lab=Lab collect   11/27/21 1156   11/28/21 5427  Basic metabolic panel  Daily,   R     Question:  Specimen collection method  Answer:  Lab=Lab collect   11/27/21 1156            Signed, Terrilee Croak, MD [facility] 11/28/2021

## 2021-11-28 NOTE — TOC Initial Note (Signed)
Transition of Care [facility]) - Initial/Assessment Note    Patient Details  Name: Clinton [name] MRN: [id number] Date of Birth: 01-04-1941  Transition of Care [facility]) CM/SW Contact:    Milinda Antis, [place] Phone Number: 11/28/2021, 4:06 PM  Clinical Narrative:                 CSW met with the patient at bedside to discuss the patient returning to [facility].  The patient was disoriented and reporting that he lived at his parent's home and came here "looking for muffins" last night.  The patient was aware that he was in the hospital, but thought that he was "in the new part in the parking lot".  CSW contacted the patient's daughter in law, [name], after being unable to get in contact with either of the patient's sons.  CSW explained that the patient was disoriented and ask if the family was in agreement with the patient returning to the SNF.  The daughter in law was in agreement and stated that she would informed the patient's son, [name] (her husband), of this information.    Expected Discharge Plan: Long Term Nursing Home Barriers to Discharge: Continued Medical Work up   Patient Goals and CMS Choice   CMS Medicare.gov Compare Post Acute Care list provided to:: Patient Represenative (must comment) Choice offered to / list presented to : Adult Children  Expected Discharge Plan and Services Expected Discharge Plan: [place]       Living arrangements for the past 2 months: [place]                                      Prior Living Arrangements/Services Living arrangements for the past 2 months: [place] Lives with:: Facility Resident Patient language and need for interpreter reviewed:: Yes        Need for Family Participation in Patient Care: Yes (Comment) Care giver support system in place?: Yes (comment)   Criminal Activity/Legal Involvement Pertinent to Current Situation/Hospitalization: No - Comment as needed  Activities of  Daily Living      Permission Sought/Granted                  Emotional Assessment Appearance:: Appears older than stated age Attitude/Demeanor/Rapport: Inconsistent Affect (typically observed): Blunt Orientation: : Fluctuating Orientation (Suspected and/or reported Sundowners), Oriented to Self, Oriented to Place Alcohol / Substance Use: Not Applicable Psych Involvement: No (comment)  Admission diagnosis:  AKI (acute kidney injury) ([place]) [N17.9] Severe sepsis with acute organ dysfunction ([place]) [A41.9, R65.20] Patient Active Problem List   Diagnosis Date Noted   Pressure injury of skin 11/24/2021   Severe sepsis with acute organ dysfunction ([place]) 11/23/2021   Protein-calorie malnutrition, severe 02/12/2021   Bladder mass    Acute lower UTI 02/10/2021   Hematemesis 02/10/2021   Thrombocytopenia ([place]) 01/25/2021   Acute CHF (congestive heart failure) ([place]) 01/17/2021   Hypertensive urgency 01/17/2021   Bradycardia 01/17/2021   Fall 01/17/2021   Near syncope 01/17/2021   AKI (acute kidney injury) ([place])    Rotator cuff tear arthropathy of both shoulders 01/25/2016   Degenerative arthritis of knee, bilateral 01/25/2016   Status post lumbar spinal fusion 11/29/2014   GERD (gastroesophageal reflux disease) 11/25/2014   Spinal stenosis 11/24/2014   CAD (coronary artery disease) 11/15/2014   HTN (hypertension) 11/15/2014   Hyperlipidemia 11/15/2014   PCP:  Carlisle Cater,  Tommi Rumps, NP Pharmacy:   [facility] [place], [place] - [address] [place] [address] Phone: [phone] Fax: [phone]     Social Determinants of Health (SDOH) Interventions    Readmission Risk Interventions No flowsheet data found.

## 2021-11-28 NOTE — TOC Progression Note (Signed)
Transition of Care [facility]) - Initial/Assessment Note    Patient Details  Name: Zaylon Bossier MRN: [id number] Date of Birth: 1940/12/27  Transition of Care [facility]) CM/SW Contact:    Milinda Antis, [place] Phone Number: 11/28/2021, 10:32 AM  Clinical Narrative:                 CSW received notification that the patient should be medically ready to d/c back to his LT facility [facility]) tomorrow.  CSW contacted Juliann Pulse with [facility] who confirmed that the patient is LTC with their facility and can return tomorrow.  Care team notified.  10:30-  CSW requested that at rapid COVID test be ordered and collected today in preparation for d/c tomorrow.        Patient Goals and CMS Choice        Expected Discharge Plan and Services                                                Prior Living Arrangements/Services                       Activities of Daily Living      Permission Sought/Granted                  Emotional Assessment              Admission diagnosis:  AKI (acute kidney injury) ([place]) [N17.9] Severe sepsis with acute organ dysfunction ([place]) [A41.9, R65.20] Patient Active Problem List   Diagnosis Date Noted   Pressure injury of skin 11/24/2021   Severe sepsis with acute organ dysfunction ([place]) 11/23/2021   Protein-calorie malnutrition, severe 02/12/2021   Bladder mass    Acute lower UTI 02/10/2021   Hematemesis 02/10/2021   Thrombocytopenia ([place]) 01/25/2021   Acute CHF (congestive heart failure) ([place]) 01/17/2021   Hypertensive urgency 01/17/2021   Bradycardia 01/17/2021   Fall 01/17/2021   Near syncope 01/17/2021   AKI (acute kidney injury) ([place])    Rotator cuff tear arthropathy of both shoulders 01/25/2016   Degenerative arthritis of knee, bilateral 01/25/2016   Status post lumbar spinal fusion 11/29/2014   GERD (gastroesophageal reflux disease) 11/25/2014   Spinal stenosis 11/24/2014   CAD (coronary artery disease)  11/15/2014   HTN (hypertension) 11/15/2014   Hyperlipidemia 11/15/2014   PCP:  [name], NP Pharmacy:   [facility] [place], [place] - [address] [place] [address] Phone: [phone] Fax: [phone]     Social Determinants of Health (SDOH) Interventions    Readmission Risk Interventions No flowsheet data found.

## 2021-11-29 LAB — GLUCOSE, CAPILLARY
Glucose-Capillary: 102 mg/dL — ABNORMAL HIGH (ref 70–99)
Glucose-Capillary: 69 mg/dL — ABNORMAL LOW (ref 70–99)
Glucose-Capillary: 78 mg/dL (ref 70–99)
Glucose-Capillary: 80 mg/dL (ref 70–99)
Glucose-Capillary: 82 mg/dL (ref 70–99)
Glucose-Capillary: 83 mg/dL (ref 70–99)
Glucose-Capillary: 89 mg/dL (ref 70–99)

## 2021-11-29 LAB — LACTIC ACID, PLASMA: Lactic Acid, Venous: 1.3 mmol/L (ref 0.5–1.9)

## 2021-11-29 LAB — HEMOGLOBIN A1C
Hgb A1c MFr Bld: 5.3 % (ref 4.8–5.6)
Mean Plasma Glucose: 105.41 mg/dL

## 2021-11-29 LAB — BASIC METABOLIC PANEL
Anion gap: 6 (ref 5–15)
BUN: 21 mg/dL (ref 8–23)
CO2: 20 mmol/L — ABNORMAL LOW (ref 22–32)
Calcium: 7.8 mg/dL — ABNORMAL LOW (ref 8.9–10.3)
Chloride: 109 mmol/L (ref 98–111)
Creatinine, Ser: 1.08 mg/dL (ref 0.61–1.24)
GFR, Estimated: >60 mL/min (ref >60)
Glucose, Bld: 150 mg/dL — ABNORMAL HIGH (ref 70–99)
Potassium: 4 mmol/L (ref 3.5–5.1)
Sodium: 135 mmol/L (ref 135–145)

## 2021-11-29 LAB — CBC WITH DIFFERENTIAL/PLATELET
Abs Immature Granulocytes: 0.18 10*3/uL — ABNORMAL HIGH (ref 0.00–0.07)
Basophils Absolute: 0 10*3/uL (ref 0.0–0.1)
Basophils Relative: 0 %
Eosinophils Absolute: 0.2 10*3/uL (ref 0.0–0.5)
Eosinophils Relative: 3 %
HCT: 31.2 % — ABNORMAL LOW (ref 39.0–52.0)
Hemoglobin: 10.3 g/dL — ABNORMAL LOW (ref 13.0–17.0)
Immature Granulocytes: 2 %
Lymphocytes Relative: 14 %
Lymphs Abs: 1.1 10*3/uL (ref 0.7–4.0)
MCH: 30.9 pg (ref 26.0–34.0)
MCHC: 33 g/dL (ref 30.0–36.0)
MCV: 93.7 fL (ref 80.0–100.0)
Monocytes Absolute: 0.8 10*3/uL (ref 0.1–1.0)
Monocytes Relative: 11 %
Neutro Abs: 5.4 10*3/uL (ref 1.7–7.7)
Neutrophils Relative %: 70 %
Platelets: 180 10*3/uL (ref 150–400)
RBC: 3.33 MIL/uL — ABNORMAL LOW (ref 4.22–5.81)
RDW: 13.9 % (ref 11.5–15.5)
WBC: 7.7 10*3/uL (ref 4.0–10.5)
nRBC: 0 % (ref 0.0–0.2)

## 2021-11-29 MED ORDER — DEXTROSE 50 % IV SOLN
INTRAVENOUS | Status: AC
  Filled 2021-11-29: qty 50

## 2021-11-29 NOTE — TOC Progression Note (Signed)
Transition of Care [facility]) - Initial/Assessment Note    Patient Details  Name: Clinton [name] MRN: [id number] Date of Birth: 1941-06-04  Transition of Care [facility]) CM/SW Contact:    Milinda Antis, [place] Phone Number: 11/29/2021, 12:44 PM  Clinical Narrative:                 CSW informed that d/c is on hold due to the patient having a fever overnight.  Facility notified.  Expected Discharge Plan: Long Term Nursing Home Barriers to Discharge: Continued Medical Work up   Patient Goals and CMS Choice   CMS Medicare.gov Compare Post Acute Care list provided to:: Patient Represenative (must comment) Choice offered to / list presented to : Adult Children  Expected Discharge Plan and Services Expected Discharge Plan: [place]       Living arrangements for the past 2 months: [place]                                      Prior Living Arrangements/Services Living arrangements for the past 2 months: [place] Lives with:: Facility Resident Patient language and need for interpreter reviewed:: Yes        Need for Family Participation in Patient Care: Yes (Comment) Care giver support system in place?: Yes (comment)   Criminal Activity/Legal Involvement Pertinent to Current Situation/Hospitalization: No - Comment as needed  Activities of Daily Living      Permission Sought/Granted                  Emotional Assessment Appearance:: Appears older than stated age Attitude/Demeanor/Rapport: Inconsistent Affect (typically observed): Blunt Orientation: : Fluctuating Orientation (Suspected and/or reported Sundowners), Oriented to Self, Oriented to Place Alcohol / Substance Use: Not Applicable Psych Involvement: No (comment)  Admission diagnosis:  AKI (acute kidney injury) ([place]) [N17.9] Severe sepsis with acute organ dysfunction ([place]) [A41.9, R65.20] Patient Active Problem List   Diagnosis Date Noted   Pressure injury  of skin 11/24/2021   Severe sepsis with acute organ dysfunction ([place]) 11/23/2021   Protein-calorie malnutrition, severe 02/12/2021   Bladder mass    Acute lower UTI 02/10/2021   Hematemesis 02/10/2021   Thrombocytopenia ([place]) 01/25/2021   Acute CHF (congestive heart failure) ([place]) 01/17/2021   Hypertensive urgency 01/17/2021   Bradycardia 01/17/2021   Fall 01/17/2021   Near syncope 01/17/2021   AKI (acute kidney injury) ([place])    Rotator cuff tear arthropathy of both shoulders 01/25/2016   Degenerative arthritis of knee, bilateral 01/25/2016   Status post lumbar spinal fusion 11/29/2014   GERD (gastroesophageal reflux disease) 11/25/2014   Spinal stenosis 11/24/2014   CAD (coronary artery disease) 11/15/2014   HTN (hypertension) 11/15/2014   Hyperlipidemia 11/15/2014   PCP:  [name], NP Pharmacy:   [facility] [place], [place] - [address] [place] [address] Phone: [phone] Fax: [phone]     Social Determinants of Health (SDOH) Interventions    Readmission Risk Interventions No flowsheet data found.

## 2021-11-29 NOTE — Progress Notes (Signed)
PROGRESS NOTE  Clinton [name]  DOB: 03-17-1941  PCP: [name], NP GEZ:[id number]  DOA: 11/23/2021  LOS: 6 days  Hospital Day: 7  Chief Complaint  Patient presents with   Altered Mental Status    Brief narrative: Clinton [name] is a 80 y.o. male with PMH significant for HTN, HLD, CAD, GERD, anxiety, spinal stenosis, arthritis, history of GBS after a flu vaccine. Patient was brought to the ED from [place] on 12/16 for altered mental status and fever.  EMS found him unresponsive and cyanotic.  They brought him to ED with BVM, subsequently intubated in the ED. In the ED, Labs showed creatinine elevated to 4.24, WBC count elevated to 14.6, lactic acid up to 7.4. Urinalysis with yellow hazy urine with positive nitrite, large amount of leukocytes and many bacteria He was started on broad-spectrum antibiotics, fluids, pressors and at septic shock.   Urine culture sent on admission grew Providencia stuartii and Proteus mirabilis. With clinical improvement, patient was transferred out to [facility] service on 12/20.  Subjective: Patient was seen and examined this morning.   Elderly Caucasian male.   Alert, awake, chronically sick and weak looking. Had a fever of 100.6 last night.  Assessment/Plan: Septic shock due to complicated UTI  Associated with chronic Foley catheter -Urine culture grew Providencia stuartii and proteus mirabilis. -Based on sensitivity pattern, currently on IV Rocephin.   -Off pressors. Hemodynamically stable. -WBC count normalized.  Lactic acid level normal as well. -Patient however had a fever episode last night. -Monitor for fever.  As previously planned, patient is to complete course of IV Rocephin today. Recent Labs  Lab 11/23/21 1530 11/23/21 1730 11/23/21 2000 11/24/21 0612 11/26/21 0206 11/27/21 0108 11/28/21 0257 11/29/21 0341  WBC 13.9*  --    < > 14.6* 8.4 8.4 8.6 7.7  LATICACIDVEN 7.4* 6.0*  --   --   --   --   --  1.3   < > =  values in this interval not displayed.    Acute septic, metabolic encephalopathy  -Mental status normalized.  Acute respiratory failure with hypoxia  -due to sepsis.  Intubated on admission, extubated 12/18.  Currently on room air.  AKI on CKD2 Acute metabolic acidosis -Baseline creatinine 1.05 from October 2022 -Presented with creatinine elevated to 4.24, due to sepsis.   -Creatinine gradually improved to normal with IV fluid. -Serum bicarb level improving as well with improvement in renal function. Recent Labs    02/21/21 0140 09/17/21 1615 11/23/21 1530 11/23/21 1538 11/23/21 2000 11/24/21 0612 11/25/21 0127 11/26/21 0206 11/27/21 0108 11/28/21 0501 11/29/21 0341  BUN 15 31* 82* 96* 79* 81* 67* 51* 35* 23 21  CREATININE 1.24 1.05 4.24* 3.70* 3.61* 3.10* 2.03* 1.51* 1.33* 1.05 1.08  CO2 23 26 14*  --  18* 20* 19* 19* 20* 19* 20*   Hypokalemia -Improved with replacement.  Continue to monitor Recent Labs  Lab 11/24/21 0612 11/25/21 0127 11/26/21 0206 11/27/21 0108 11/28/21 0501 11/29/21 0341  K 3.8 3.5 3.2* 3.6 4.4 4.0  MG 1.7 2.1  --  1.7  --   --   PHOS 2.7 2.6  --   --   --   --    Chronic Foley catheter in place -Probably due to poor mobility.  Transient left-sided weakness On 12/18, he was noted to have asymmetric left-sided weakness.  CT scan of head was normal.  Symptoms resolved in 24 hours.   History of essential hypertension Grade 1 diastolic  dysfunction -Not on any blood pressure meds.  Blood pressure is currently normal range  CAD/HLD -Was on aspirin 81 mg daily at home -Not sure why patient was not on statin.  history of GBS  -following a flu vaccine, unclear when  Mobility: PT eval ordered  Living condition: [place] facility Goals of care:   Code Status: Partial Code.  Okay with intubation but no compression per record Nutritional status: Body mass index is 22.02 kg/m.  Nutrition Problem: Severe Malnutrition Etiology:  chronic illness (GBS) Signs/Symptoms: severe fat depletion, severe muscle depletion Diet:  Diet Order             DIET DYS 3 Room service appropriate? Yes with Assist; Fluid consistency: Thin  Diet effective now                  DVT prophylaxis:  heparin injection 5,000 Units Start: 11/23/21 2200   Antimicrobials: To finish the course of IV Rocephin today Fluid: None Consultants: PCCM Family Communication: None at bedside  Status is: Inpatient  Continue in-hospital care because: Improving sepsis Level of care: Telemetry Medical   Dispo: The patient is from: Nursing home              Anticipated d/c is to: Back to nursing home tomorrow.  Unable to discharge today because of fever              Patient currently is not medically stable to d/c.   Difficult to place patient No     Infusions:   sodium chloride Stopped (11/28/21 0523)    Scheduled Meds:  Chlorhexidine Gluconate Cloth  6 each Topical Q0600   feeding supplement  237 mL Oral TID BM   heparin  5,000 Units Subcutaneous Q8H   mouth rinse  15 mL Mouth Rinse BID   multivitamin with minerals  1 tablet Oral Daily    PRN meds: acetaminophen (TYLENOL) oral liquid 160 mg/5 mL, [COMPLETED] loperamide **FOLLOWED BY** loperamide, ondansetron (ZOFRAN) IV, polyethylene glycol   Antimicrobials: Anti-infectives (From admission, onward)    Start     Dose/Rate Route Frequency Ordered Stop   11/26/21 1200  cefTRIAXone (ROCEPHIN) 1 g in sodium chloride 0.9 % 100 mL IVPB        1 g 200 mL/hr over 30 Minutes Intravenous Daily 11/26/21 0756 11/29/21 1001   11/25/21 1315  ceFEPIme (MAXIPIME) 2 g in sodium chloride 0.9 % 100 mL IVPB  Status:  Discontinued        2 g 200 mL/hr over 30 Minutes Intravenous Every 12 hours 11/25/21 1302 11/26/21 0756   11/25/21 0800  vancomycin (VANCOREADY) IVPB 750 mg/150 mL  Status:  Discontinued        750 mg 150 mL/hr over 60 Minutes Intravenous Every 24 hours 11/25/21 0720 11/25/21 1611    11/24/21 1600  ceFEPIme (MAXIPIME) 2 g in sodium chloride 0.9 % 100 mL IVPB  Status:  Discontinued        2 g 200 mL/hr over 30 Minutes Intravenous Every 24 hours 11/23/21 1557 11/25/21 1302   11/23/21 1556  vancomycin variable dose per unstable renal function (pharmacist dosing)  Status:  Discontinued         Does not apply See admin instructions 11/23/21 1557 11/25/21 0722   11/23/21 1545  ceFEPIme (MAXIPIME) 2 g in sodium chloride 0.9 % 100 mL IVPB        2 g 200 mL/hr over 30 Minutes Intravenous  Once 11/23/21 1532 11/23/21  1604   11/23/21 1545  metroNIDAZOLE (FLAGYL) IVPB 500 mg        500 mg 100 mL/hr over 60 Minutes Intravenous  Once 11/23/21 1532 11/23/21 1704   11/23/21 1545  vancomycin (VANCOREADY) IVPB 1500 mg/300 mL        1,500 mg 150 mL/hr over 120 Minutes Intravenous  Once 11/23/21 1532 11/23/21 1841       Objective: Vitals:   11/29/21 0900 11/29/21 1143  BP: 125/67   Pulse: 74   Resp: (!) 31   Temp:  98.9 F (37.2 C)  SpO2:      Intake/Output Summary (Last 24 hours) at 11/29/2021 1305 Last data filed at 11/29/2021 1200 Gross per 24 hour  Intake 1774.5 ml  Output 2625 ml  Net -850.5 ml   Filed Weights   11/25/21 0500 11/28/21 0500 11/29/21 0500  Weight: 71.2 kg 71.6 kg 69.6 kg   Weight change: -2 kg Body mass index is 22.02 kg/m.   Physical Exam: General exam: Elderly Caucasian male.  Chronically sick and debilitated looking.  Chronic Foley catheter in place.   Skin: No rashes, lesions or ulcers. HEENT: Atraumatic, normocephalic, no obvious bleeding Lungs: Diminished air entry in both bases. CVS: Regular rate and rhythm, no murmur GI/Abd soft, nontender, nondistended, bowel sound present CNS: Alert, awake, able to have some conversation.  Oriented to place and person Psychiatry: Sad affect Extremities: No pedal edema, no calf tenderness  Data Review: I have personally reviewed the laboratory data and studies available.  F/u labs  ordered Unresulted Labs (From admission, onward)     Start     Ordered   11/28/21 0500  CBC with Differential/Platelet  Daily,   R     Question:  Specimen collection method  Answer:  Lab=Lab collect   11/27/21 1156   11/28/21 7867  Basic metabolic panel  Daily,   R     Question:  Specimen collection method  Answer:  Lab=Lab collect   11/27/21 1156            Signed, Terrilee Croak, MD [facility] 11/29/2021

## 2021-11-29 NOTE — Progress Notes (Signed)
Daughter [name] notified of transfer to [phone]

## 2021-11-30 LAB — BASIC METABOLIC PANEL
Anion gap: 4 — ABNORMAL LOW (ref 5–15)
BUN: 18 mg/dL (ref 8–23)
CO2: 22 mmol/L (ref 22–32)
Calcium: 8.1 mg/dL — ABNORMAL LOW (ref 8.9–10.3)
Chloride: 109 mmol/L (ref 98–111)
Creatinine, Ser: 0.98 mg/dL (ref 0.61–1.24)
GFR, Estimated: >60 mL/min (ref >60)
Glucose, Bld: 92 mg/dL (ref 70–99)
Potassium: 4.1 mmol/L (ref 3.5–5.1)
Sodium: 135 mmol/L (ref 135–145)

## 2021-11-30 LAB — CBC WITH DIFFERENTIAL/PLATELET
Abs Immature Granulocytes: 0.23 10*3/uL — ABNORMAL HIGH (ref 0.00–0.07)
Basophils Absolute: 0 10*3/uL (ref 0.0–0.1)
Basophils Relative: 0 %
Eosinophils Absolute: 0.3 10*3/uL (ref 0.0–0.5)
Eosinophils Relative: 4 %
HCT: 32.3 % — ABNORMAL LOW (ref 39.0–52.0)
Hemoglobin: 10.5 g/dL — ABNORMAL LOW (ref 13.0–17.0)
Immature Granulocytes: 3 %
Lymphocytes Relative: 17 %
Lymphs Abs: 1.4 10*3/uL (ref 0.7–4.0)
MCH: 30.4 pg (ref 26.0–34.0)
MCHC: 32.5 g/dL (ref 30.0–36.0)
MCV: 93.6 fL (ref 80.0–100.0)
Monocytes Absolute: 0.7 10*3/uL (ref 0.1–1.0)
Monocytes Relative: 9 %
Neutro Abs: 5.4 10*3/uL (ref 1.7–7.7)
Neutrophils Relative %: 67 %
Platelets: 247 10*3/uL (ref 150–400)
RBC: 3.45 MIL/uL — ABNORMAL LOW (ref 4.22–5.81)
RDW: 14 % (ref 11.5–15.5)
WBC: 8 10*3/uL (ref 4.0–10.5)
nRBC: 0 % (ref 0.0–0.2)

## 2021-11-30 LAB — GLUCOSE, CAPILLARY
Glucose-Capillary: 84 mg/dL (ref 70–99)
Glucose-Capillary: 94 mg/dL (ref 70–99)
Glucose-Capillary: 105 mg/dL — ABNORMAL HIGH (ref 70–99)

## 2021-11-30 MED ORDER — POLYETHYLENE GLYCOL 3350 17 G PO PACK: 17.0000 g | PACK | Freq: Every day | ORAL | 0 refills | Status: AC | PRN

## 2021-11-30 MED ORDER — LOPERAMIDE HCL 2 MG PO CAPS: 2.0000 mg | ORAL_CAPSULE | Freq: Four times a day (QID) | ORAL | 0 refills | Status: AC | PRN

## 2021-11-30 NOTE — Progress Notes (Signed)
Received transfer patient from 72M to room 5M21 via regular bed with his belongings. Oriented to room and staff. patient NAD, alert and oriented, vital signs taken and recorded. Needs met. Call bell and room phone placed within patient's reached.

## 2021-11-30 NOTE — Progress Notes (Addendum)
1450: Attempted for a second time to give report to [employer] without success. Cedric Fishman, LCSW made aware of unsuccessful report. She has this nurse's work phone number and is to give to admission coordinator.   1533: [facility] here to transport patient, will attempt to call report again.  1540: report given to [place] at [facility]

## 2021-11-30 NOTE — Care Management Important Message (Signed)
Important Message  Patient Details  Name: Clinton [name] MRN: [id number] Date of Birth: 1941/08/14   Medicare Important Message Given:  Yes     Orbie Pyo 11/30/2021, 3:30 PM

## 2021-11-30 NOTE — Discharge Summary (Signed)
Physician Discharge Summary  Clinton [name] WJX:[id number] DOB: Feb 12, 1941 DOA: 11/23/2021  PCP: [name], NP  Admit date: 11/23/2021 Discharge date: 11/30/2021  Admitted From: nursing facility Discharge disposition: Back to nursing facility   Code Status: Partial Code   Discharge Diagnosis:   Principal Problem:   Severe sepsis with acute organ dysfunction ([place]) Active Problems:   Pressure injury of skin    Chief Complaint  Patient presents with   Altered Mental Status    Brief narrative: Clinton [name] is a 80 y.o. male with PMH significant for HTN, HLD, CAD, GERD, anxiety, spinal stenosis, arthritis, history of GBS after a flu vaccine. Patient was brought to the ED from [place] on 12/16 for altered mental status and fever.  EMS found him unresponsive and cyanotic.  They brought him to ED with BVM, subsequently intubated in the ED. In the ED, Labs showed creatinine elevated to 4.24, WBC count elevated to 14.6, lactic acid up to 7.4. Urinalysis with yellow hazy urine with positive nitrite, large amount of leukocytes and many bacteria He was started on broad-spectrum antibiotics, fluids, pressors and at septic shock.   Urine culture sent on admission grew Providencia stuartii and Proteus mirabilis. With clinical improvement, patient was transferred out to [facility] service on 12/20.  Subjective: Patient was seen and examined this morning.   Elderly Caucasian male.   Alert, awake, chronically sick.  Old answer questions.  No fever last 24 hours.  Hospital course: Septic shock due to complicated UTI  Associated with chronic Foley catheter -Urine culture grew Providencia stuartii and proteus mirabilis. -Based on sensitivity pattern, he was treated with IV Rocephin -Off pressors. Hemodynamically stable. -WBC count normalized.  Lactic acid level normal as well. -No fever in last 24 hours.  Completed a course of IV Rocephin Recent Labs  Lab 11/23/21 1530  11/23/21 1730 11/23/21 2000 11/26/21 0206 11/27/21 0108 11/28/21 0257 11/29/21 0341 11/30/21 0336  WBC 13.9*  --    < > 8.4 8.4 8.6 7.7 8.0  LATICACIDVEN 7.4* 6.0*  --   --   --   --  1.3  --    < > = values in this interval not displayed.    Acute septic, metabolic encephalopathy  -Mental status normalized.  Acute respiratory failure with hypoxia  -due to sepsis.  Intubated on admission, extubated 12/18.  Currently on room air.  AKI on CKD2 Acute metabolic acidosis -Baseline creatinine 1.05 from October 2022 -Presented with creatinine elevated to 4.24, due to sepsis.   -Creatinine gradually improved to normal with IV fluid. -Serum bicarb level improving as well with improvement in renal function. Recent Labs    09/17/21 1615 11/23/21 1530 11/23/21 1538 11/23/21 2000 11/24/21 0612 11/25/21 0127 11/26/21 0206 11/27/21 0108 11/28/21 0501 11/29/21 0341 11/30/21 0336  BUN 31* 82* 96* 79* 81* 67* 51* 35* 23 21 18   CREATININE 1.05 4.24* 3.70* 3.61* 3.10* 2.03* 1.51* 1.33* 1.05 1.08 0.98  CO2 26 14*  --  18* 20* 19* 19* 20* 19* 20* 22   Hypokalemia -Improved with replacement.  Continue to monitor Recent Labs  Lab 11/24/21 0612 11/25/21 0127 11/26/21 0206 11/27/21 0108 11/28/21 0501 11/29/21 0341 11/30/21 0336  K 3.8 3.5 3.2* 3.6 4.4 4.0 4.1  MG 1.7 2.1  --  1.7  --   --   --   PHOS 2.7 2.6  --   --   --   --   --    Chronic Foley catheter in  place -Probably due to poor mobility.  Transient left-sided weakness On 12/18, he was noted to have asymmetric left-sided weakness.  CT scan of head was normal.  Symptoms resolved in 24 hours.   History of essential hypertension Grade 1 diastolic dysfunction -Not on any blood pressure meds.  Blood pressure is currently normal range  CAD/HLD -Was on aspirin 81 mg daily at home -Not sure why patient was not on statin.  history of GBS  -following a flu vaccine, unclear when  Mobility: PT eval ordered  Living  condition: [place] facility Goals of care:   Code Status: Partial Code.  Okay with intubation but no compression per record Nutritional status: Body mass index is 22.02 kg/m.  Nutrition Problem: Severe Malnutrition Etiology: chronic illness (GBS) Signs/Symptoms: severe fat depletion, severe muscle depletion   Discharge Medications:   Allergies as of 11/30/2021   No Known Allergies      Medication List     STOP taking these medications    cephALEXin 500 MG capsule Commonly known as: KEFLEX   feeding supplement Liqd   multivitamin with minerals Tabs tablet       TAKE these medications    aspirin 81 MG chewable tablet Chew 1 tablet (81 mg total) by mouth daily.   calcium carbonate 750 MG chewable tablet Commonly known as: TUMS EX Chew 1 tablet (750 mg total) by mouth 3 (three) times daily.   cetirizine 10 MG tablet Commonly known as: ZYRTEC Take 10 mg by mouth daily.   guaiFENesin 200 MG tablet Take 200 mg by mouth every 12 (twelve) hours.   loperamide 2 MG capsule Commonly known as: IMODIUM Take 1 capsule (2 mg total) by mouth every 6 (six) hours as needed for diarrhea or loose stools.   nitroGLYCERIN 0.3 MG SL tablet Commonly known as: NITROSTAT Place 0.3 mg under the tongue every 5 (five) minutes as needed for chest pain.   polyethylene glycol 17 g packet Commonly known as: MIRALAX / GLYCOLAX Take 17 g by mouth daily as needed for moderate constipation.   senna-docusate 8.6-50 MG tablet Commonly known as: Senokot-S Take 1 tablet by mouth at bedtime.               Discharge Care Instructions  (From admission, onward)           Start     Ordered   11/30/21 0000  Discharge wound care:        11/30/21 1155            Wound care:   Incision (Closed) 11/24/14 Back (Active)  Date First Assessed/Time First Assessed: 11/24/14 1041   Location: Back    Assessments 11/24/2014 12:25 PM 11/28/2014  8:00 AM  Dressing Type  Gauze (Comment) Gauze (Comment);Tape dressing  Dressing Clean;Dry;Intact Reinforced  Site / Wound Assessment Other (Comment) Dressing in place / Unable to assess  Drainage Amount None None     No Linked orders to display     Pressure Injury 01/19/21 Buttocks Posterior Unblanchable Redness bilateral Buttocks (Active)  Date First Assessed/Time First Assessed: 01/19/21 1324   Location: Buttocks  Location Orientation: Posterior  Wound Description (Comments): Unblanchable Redness bilateral Buttocks  Present on Admission: Yes    Assessments 02/20/2021  9:07 PM  Treatment Off loading     No Linked orders to display     Wound / Incision (Open or Dehisced) 02/10/21 Other (Comment) Sacrum (Active)  Date First Assessed/Time First Assessed: 02/10/21 2145   Wound Type:  Other (Comment)  Location: Sacrum  Present on Admission: Yes    Assessments 02/10/2021 10:30 PM 02/20/2021  9:07 PM  Dressing Type -- Foam - Lift dressing to assess site every shift  Dressing Changed New --  Dressing Status None --  Dressing Change Frequency -- PRN  Site / Wound Assessment -- Granulation tissue  Treatment Cleansed --     No Linked orders to display     Pressure Injury 11/23/21 Heel Left Stage 1 -  Intact skin with non-blanchable redness of a localized area usually over a bony prominence. (Active)  Date First Assessed/Time First Assessed: 11/23/21 2045   Location: Heel  Location Orientation: Left  Staging: Stage 1 -  Intact skin with non-blanchable redness of a localized area usually over a bony prominence.  Present on Admission: Yes    Assessments 11/23/2021  8:45 PM 11/30/2021  1:00 AM  Dressing Type Foam - Lift dressing to assess site every shift Foam - Lift dressing to assess site every shift  Dressing Clean;Dry;Intact --  Dressing Change Frequency PRN --  Site / Wound Assessment Clean;Dry;Red --  Peri-wound Assessment Intact --  Drainage Amount None --  Treatment Off loading --     No Linked orders to  display     Pressure Injury 11/23/21 Heel Right Stage 1 -  Intact skin with non-blanchable redness of a localized area usually over a bony prominence. (Active)  Date First Assessed/Time First Assessed: 11/23/21 2045   Location: Heel  Location Orientation: Right  Staging: Stage 1 -  Intact skin with non-blanchable redness of a localized area usually over a bony prominence.  Present on Admission: Yes    Assessments 11/23/2021  8:45 PM 11/30/2021  1:00 AM  Dressing Type Foam - Lift dressing to assess site every shift Foam - Lift dressing to assess site every shift  Dressing Clean;Dry;Intact --  Dressing Change Frequency PRN --  Site / Wound Assessment Clean;Dry;Red --  Peri-wound Assessment Intact --  Drainage Amount None --  Treatment Off loading --     No Linked orders to display    Discharge Instructions:   Discharge Instructions     Call MD for:  difficulty breathing, headache or visual disturbances   Complete by: As directed    Call MD for:  extreme fatigue   Complete by: As directed    Call MD for:  hives   Complete by: As directed    Call MD for:  persistant dizziness or light-headedness   Complete by: As directed    Call MD for:  persistant nausea and vomiting   Complete by: As directed    Call MD for:  severe uncontrolled pain   Complete by: As directed    Call MD for:  temperature >100.4   Complete by: As directed    Diet general   Complete by: As directed    Discharge instructions   Complete by: As directed    General discharge instructions:  Follow with Primary MD [name], NP in 7 days   Get CBC/BMP checked in next visit within 1 week by PCP or SNF MD. (We routinely change or add medications that can affect your baseline labs and fluid status, therefore we recommend that you get the mentioned basic workup next visit with your PCP, your PCP may decide not to get them or add new tests based on their clinical decision)  On your next visit with your PCP,  please get your medicines reviewed and adjusted.  Please request your PCP  to go over all hospital tests, procedures, radiology results at the follow up, please get all Hospital records sent to your PCP by signing hospital release before you go home.  Activity: As tolerated with Full fall precautions use walker/cane & assistance as needed  Avoid using any recreational substances like cigarette, tobacco, alcohol, or non-prescribed drug.  If you experience worsening of your admission symptoms, develop shortness of breath, life threatening emergency, suicidal or homicidal thoughts you must seek medical attention immediately by calling 911 or calling your MD immediately  if symptoms less severe.  You must read complete instructions/literature along with all the possible adverse reactions/side effects for all the medicines you take and that have been prescribed to you. Take any new medicine only after you have completely understood and accepted all the possible adverse reactions/side effects.   Do not drive, operate heavy machinery, perform activities at heights, swimming or participation in water activities or provide baby sitting services if your were admitted for syncope or siezures until you have seen by Primary MD or a Neurologist and advised to do so again.  Do not drive when taking Pain medications.  Do not take more than prescribed Pain, Sleep and Anxiety Medications  Wear Seat belts while driving.  Please note You were cared for by a hospitalist during your hospital stay. If you have any questions about your discharge medications or the care you received while you were in the hospital after you are discharged, you can call the unit and asked to speak with the hospitalist on call if the hospitalist that took care of you is not available. Once you are discharged, your primary care physician will handle any further medical issues. Please note that NO REFILLS for any discharge medications will be  authorized once you are discharged, as it is imperative that you return to your primary care physician (or establish a relationship with a primary care physician if you do not have one) for your aftercare needs so that they can reassess your need for medications and monitor your lab values.   Discharge wound care:   Complete by: As directed    Increase activity slowly   Complete by: As directed        Follow ups:    Follow-up Information     Nafziger, Tommi Rumps, NP Follow up.   Specialty: Family Medicine Contact information: [place] [address] [phone]         [name], MD .   Specialty: Cardiology Contact information: [phone] [address] [place] [address] [phone]                 Discharge Exam:   Vitals:   11/29/21 2000 11/29/21 2056 11/29/21 2351 11/30/21 0446  BP: (!) 142/79 (!) 175/80 126/68 136/72  Pulse: 77 86 73 66  Resp: (!) 27 18 15 15   Temp:  98.3 F (36.8 C) 98 F (36.7 C) 98.5 F (36.9 C)  TempSrc:  Oral Oral Oral  SpO2: 94% 96% 94% 96%  Weight:      Height:        Body mass index is 22.02 kg/m.  General exam: Elderly Caucasian male.  Chronically sick and debilitated looking.  Chronic Foley catheter in place.   Skin: No rashes, lesions or ulcers. HEENT: Atraumatic, normocephalic, no obvious bleeding Lungs: Diminished air entry in both bases. CVS: Regular rate and rhythm, no murmur GI/Abd soft, nontender, nondistended, bowel sound  present CNS: Alert, awake, able to have some conversation.  Oriented to place and person Psychiatry: Sad affect Extremities: No pedal edema, no calf tenderness  Time coordinating discharge: 35 minutes   The results of significant diagnostics from this hospitalization (including imaging, microbiology, ancillary and laboratory) are listed below for reference.    Procedures and Diagnostic Studies:   CT HEAD WO CONTRAST (5MM)  Result Date:  11/23/2021 CLINICAL DATA:  Mental status change. EXAM: CT HEAD WITHOUT CONTRAST TECHNIQUE: Contiguous axial images were obtained from the base of the skull through the vertex without intravenous contrast. COMPARISON:  CT head dated September 17, 2021. FINDINGS: Brain: No evidence of acute infarction, hemorrhage, hydrocephalus, extra-axial collection or mass lesion/mass effect. Mild cerebral atrophy and chronic microvascular ischemic changes. Hypodensity in the pons, likely chronic infarct, unchanged. Vascular: Prominent atherosclerotic calcification of bilateral vertebral arteries as well as carotid siphons. Skull: Normal. Negative for fracture or focal lesion. Sinuses/Orbits: No acute finding. Other: None. IMPRESSION: 1.  No acute intracranial abnormality. 2.  Stable chronic findings as above. Electronically Signed   By: Keane Police D.O.   On: 11/23/2021 17:53   US RENAL  Result Date: 11/23/2021 CLINICAL DATA:  Acute renal insufficiency EXAM: RENAL / URINARY TRACT ULTRASOUND COMPLETE COMPARISON:  February 11, 2021 FINDINGS: Right Kidney: Renal measurements: 11.1 x 5.2 x 5.3 cm = volume: 159.3 mL. The right renal pelvis is mildly prominent there is mild dilatation of lower pole calices not seen previously. Left Kidney: Renal measurements: 11.9 x 6.0 x 6.4 cm = volume: 238 mL. Echogenicity within normal limits. No mass or hydronephrosis visualized. Bladder: Decompressed and not well evaluated. Other: None. IMPRESSION: 1. Mild hydronephrosis in renal pelvis prominence on the right, not seen previously. This finding is otherwise age indeterminate. 2. The kidneys are otherwise unremarkable. 3. Decompressed bladder precluding evaluation. Electronically Signed   By: Dorise Bullion III M.D.   On: 11/23/2021 19:06   DG Chest Port 1 View  Result Date: 11/23/2021 CLINICAL DATA:  OG tube placement EXAM: CHEST  1 VIEW PORTABLE COMPARISON:  11/23/2021 FINDINGS: Endotracheal tube tip is about 2.7 cm superior to carina.  Esophageal tube tip overlies distal stomach. Small left effusion with basilar airspace disease. Stable cardiomediastinal silhouette IMPRESSION: 1. Endotracheal tube tip about 2.7 cm superior to carina. 2. Esophageal tube tip overlies the distal stomach 3. Small left-sided pleural effusion with airspace disease at the left lung base which may reflect atelectasis or pneumonia. Electronically Signed   By: Donavan Foil M.D.   On: 11/23/2021 22:53   DG Chest Portable 1 View  Result Date: 11/23/2021 CLINICAL DATA:  Altered mental status.  Post intubation. EXAM: PORTABLE CHEST 1 VIEW COMPARISON:  09/17/2021 FINDINGS: The patient is rotated to the left with grossly unchanged cardiomediastinal silhouette. Aortic atherosclerosis is noted. An endotracheal tube has been placed and terminates 3.5-4 cm above the carina. Lung volumes are low with minimal right and mild left basilar opacities. No sizable pleural effusion or pneumothorax is identified. No acute osseous abnormality is seen. IMPRESSION: Endotracheal tube as above. Low lung volumes with bibasilar atelectasis. Electronically Signed   By: Logan Bores M.D.   On: 11/23/2021 15:47     Labs:   Basic Metabolic Panel: Recent Labs  Lab 11/24/21 0612 11/25/21 0127 11/26/21 0206 11/27/21 0108 11/28/21 0501 11/29/21 0341 11/30/21 0336  NA 139 134* 136 135 136 135 135  K 3.8 3.5 3.2* 3.6 4.4 4.0 4.1  CL 107 107 107 110 111 109 109  CO2 20* 19* 19* 20* 19* 20* 22  GLUCOSE 118* 106* 85 107* 89 150* 92  BUN 81* 67* 51* 35* 23 21 18   CREATININE 3.10* 2.03* 1.51* 1.33* 1.05 1.08 0.98  CALCIUM 8.1* 7.8* 8.0* 7.9* 8.1* 7.8* 8.1*  MG 1.7 2.1  --  1.7  --   --   --   PHOS 2.7 2.6  --   --   --   --   --    GFR Estimated Creatinine Clearance: 60.2 mL/min (by C-G formula based on SCr of 0.98 mg/dL). Liver Function Tests: Recent Labs  Lab 11/23/21 1530 11/27/21 0108  AST 34 28  ALT 14 27  ALKPHOS 124 46  BILITOT 1.0 0.8  PROT 7.2 5.4*  ALBUMIN 3.3*  2.0*   No results for input(s): LIPASE, AMYLASE in the last 168 hours. No results for input(s): AMMONIA in the last 168 hours. Coagulation profile Recent Labs  Lab 11/23/21 1530  INR 1.3*    CBC: Recent Labs  Lab 11/23/21 1530 11/23/21 1538 11/26/21 0206 11/27/21 0108 11/28/21 0257 11/29/21 0341 11/30/21 0336  WBC 13.9*   < > 8.4 8.4 8.6 7.7 8.0  NEUTROABS 13.1*  --   --  6.3 6.1 5.4 5.4  HGB 16.6   < > 11.2* 10.5* 11.7* 10.3* 10.5*  HCT 51.2   < > 32.6* 32.2* 35.0* 31.2* 32.3*  MCV 96.4   < > 93.1 94.2 92.3 93.7 93.6  PLT 170   < > 101* 103* 115* 180 247   < > = values in this interval not displayed.   Cardiac Enzymes: No results for input(s): CKTOTAL, CKMB, CKMBINDEX, TROPONINI in the last 168 hours. BNP: Invalid input(s): POCBNP CBG: Recent Labs  Lab 11/29/21 2055 11/29/21 2349 11/30/21 0446 11/30/21 0819 11/30/21 1141  GLUCAP 89 102* 84 94 105*   D-Dimer No results for input(s): DDIMER in the last 72 hours. Hgb A1c Recent Labs    11/29/21 0341  HGBA1C 5.3   Lipid Profile No results for input(s): CHOL, HDL, LDLCALC, TRIG, CHOLHDL, LDLDIRECT in the last 72 hours. Thyroid function studies No results for input(s): TSH, T4TOTAL, T3FREE, THYROIDAB in the last 72 hours.  Invalid input(s): FREET3 Anemia work up No results for input(s): VITAMINB12, FOLATE, FERRITIN, TIBC, IRON, RETICCTPCT in the last 72 hours. Microbiology Recent Results (from the past 240 hour(s))  Resp Panel by RT-PCR (Flu A&B, Covid) Nasopharyngeal Swab     Status: None   Collection Time: 11/23/21  3:30 PM   Specimen: Nasopharyngeal Swab; Nasopharyngeal(NP) swabs in vial transport medium  Result Value Ref Range Status   SARS Coronavirus 2 by RT PCR NEGATIVE NEGATIVE Final    Comment: (NOTE) SARS-CoV-2 target nucleic acids are NOT DETECTED.  The SARS-CoV-2 RNA is generally detectable in upper respiratory specimens during the acute phase of infection. The lowest concentration of  SARS-CoV-2 viral copies this assay can detect is 138 copies/mL. A negative result does not preclude SARS-Cov-2 infection and should not be used as the sole basis for treatment or other patient management decisions. A negative result may occur with  improper specimen collection/handling, submission of specimen other than nasopharyngeal swab, presence of viral mutation(s) within the areas targeted by this assay, and inadequate number of viral copies(<138 copies/mL). A negative result must be combined with clinical observations, patient history, and epidemiological information. The expected result is Negative.  Fact Sheet for Patients:  EntrepreneurPulse.com.au  Fact Sheet for Healthcare Providers:  IncredibleEmployment.be  This test is no  t yet approved or cleared by the [place] and  has been authorized for detection and/or diagnosis of SARS-CoV-2 by FDA under an Emergency Use Authorization (EUA). This EUA will remain  in effect (meaning this test can be used) for the duration of the COVID-19 declaration under Section 564(b)(1) of the Act, 21 U.S.C.section 360bbb-3(b)(1), unless the authorization is terminated  or revoked sooner.       Influenza A by PCR NEGATIVE NEGATIVE Final   Influenza B by PCR NEGATIVE NEGATIVE Final    Comment: (NOTE) The Xpert Xpress SARS-CoV-2/FLU/RSV plus assay is intended as an aid in the diagnosis of influenza from Nasopharyngeal swab specimens and should not be used as a sole basis for treatment. Nasal washings and aspirates are unacceptable for Xpert Xpress SARS-CoV-2/FLU/RSV testing.  Fact Sheet for Patients: EntrepreneurPulse.com.au  Fact Sheet for Healthcare Providers: IncredibleEmployment.be  This test is not yet approved or cleared by the [place] FDA and has been authorized for detection and/or diagnosis of SARS-CoV-2 by FDA under an Emergency Use  Authorization (EUA). This EUA will remain in effect (meaning this test can be used) for the duration of the COVID-19 declaration under Section 564(b)(1) of the Act, 21 U.S.C. section 360bbb-3(b)(1), unless the authorization is terminated or revoked.  Performed at [facility], [place] [address] [place], [place] [address]   Urine Culture     Status: Abnormal   Collection Time: 11/23/21  3:30 PM   Specimen: Urine, Catheterized  Result Value Ref Range Status   Specimen Description URINE, CATHETERIZED  Final   Special Requests   Final    NONE Performed at [facility], [address], [place],  [address]    Culture (A)  Final    >=100,000 COLONIES/mL PROTEUS MIRABILIS >=100,000 COLONIES/mL PROVIDENCIA STUARTII    Report Status 11/26/2021 FINAL  Final   Organism ID, Bacteria PROTEUS MIRABILIS (A)  Final   Organism ID, Bacteria PROVIDENCIA STUARTII (A)  Final      Susceptibility   Proteus mirabilis - MIC*    AMPICILLIN >=32 RESISTANT Resistant     CEFAZOLIN RESISTANT Resistant     CEFEPIME 1 SENSITIVE Sensitive     CEFTRIAXONE <=0.25 SENSITIVE Sensitive     CIPROFLOXACIN >=4 RESISTANT Resistant     GENTAMICIN <=1 SENSITIVE Sensitive     IMIPENEM 2 SENSITIVE Sensitive     NITROFURANTOIN 128 RESISTANT Resistant     TRIMETH/SULFA >=320 RESISTANT Resistant     AMPICILLIN/SULBACTAM 8 SENSITIVE Sensitive     PIP/TAZO <=4 SENSITIVE Sensitive     * >=100,000 COLONIES/mL PROTEUS MIRABILIS   Providencia stuartii - MIC*    AMPICILLIN >=32 RESISTANT Resistant     CEFAZOLIN >=64 RESISTANT Resistant     CEFEPIME <=0.12 SENSITIVE Sensitive     CEFTRIAXONE <=0.25 SENSITIVE Sensitive     CIPROFLOXACIN 2 RESISTANT Resistant     GENTAMICIN RESISTANT Resistant     IMIPENEM 2 SENSITIVE Sensitive     NITROFURANTOIN 128 RESISTANT Resistant     TRIMETH/SULFA 160 RESISTANT Resistant     AMPICILLIN/SULBACTAM >=32 RESISTANT Resistant     PIP/TAZO <=4 SENSITIVE Sensitive     *  >=100,000 COLONIES/mL PROVIDENCIA STUARTII  Blood Culture (routine x 2)     Status: None   Collection Time: 11/23/21  4:31 PM   Specimen: BLOOD RIGHT ARM  Result Value Ref Range Status   Specimen Description BLOOD RIGHT ARM  Final   Special Requests   Final  BOTTLES DRAWN AEROBIC AND ANAEROBIC Blood Culture adequate volume   Culture   Final    NO GROWTH 5 DAYS Performed at [facility], [place] [address], [place], [place] [address]    Report Status 11/28/2021 FINAL  Final  Blood Culture (routine x 2)     Status: None   Collection Time: 11/23/21  4:51 PM   Specimen: BLOOD LEFT ARM  Result Value Ref Range Status   Specimen Description BLOOD LEFT ARM  Final   Special Requests   Final    BOTTLES DRAWN AEROBIC AND ANAEROBIC Blood Culture adequate volume   Culture   Final    NO GROWTH 5 DAYS Performed at [facility], [place] [address] [place], [place] [address]    Report Status 11/28/2021 FINAL  Final  MRSA Next Gen by PCR, Nasal     Status: None   Collection Time: 11/24/21  2:17 AM   Specimen: Nasal Mucosa; Nasal Swab  Result Value Ref Range Status   MRSA by PCR Next Gen NOT DETECTED NOT DETECTED Final    Comment: (NOTE) The GeneXpert MRSA Assay (FDA approved for NASAL specimens only), is one component of a comprehensive MRSA colonization surveillance program. It is not intended to diagnose MRSA infection nor to guide or monitor treatment for MRSA infections. Test performance is not FDA approved in patients less than 72 years old. Performed at [facility], [place] [address] [place], [place] [address]   Resp Panel by RT-PCR (Flu A&B, Covid) Nasopharyngeal Swab     Status: None   Collection Time: 11/28/21 12:21 PM   Specimen: Nasopharyngeal Swab; Nasopharyngeal(NP) swabs in vial transport medium  Result Value Ref Range Status   SARS Coronavirus 2 by RT PCR NEGATIVE NEGATIVE Final    Comment: (NOTE) SARS-CoV-2 target nucleic acids are NOT DETECTED.  The  SARS-CoV-2 RNA is generally detectable in upper respiratory specimens during the acute phase of infection. The lowest concentration of SARS-CoV-2 viral copies this assay can detect is 138 copies/mL. A negative result does not preclude SARS-Cov-2 infection and should not be used as the sole basis for treatment or other patient management decisions. A negative result may occur with  improper specimen collection/handling, submission of specimen other than nasopharyngeal swab, presence of viral mutation(s) within the areas targeted by this assay, and inadequate number of viral copies(<138 copies/mL). A negative result must be combined with clinical observations, patient history, and epidemiological information. The expected result is Negative.  Fact Sheet for Patients:  EntrepreneurPulse.com.au  Fact Sheet for Healthcare Providers:  IncredibleEmployment.be  This test is no t yet approved or cleared by the [place] FDA and  has been authorized for detection and/or diagnosis of SARS-CoV-2 by FDA under an Emergency Use Authorization (EUA). This EUA will remain  in effect (meaning this test can be used) for the duration of the COVID-19 declaration under Section 564(b)(1) of the Act, 21 U.S.C.section 360bbb-3(b)(1), unless the authorization is terminated  or revoked sooner.       Influenza A by PCR NEGATIVE NEGATIVE Final   Influenza B by PCR NEGATIVE NEGATIVE Final    Comment: (NOTE) The Xpert Xpress SARS-CoV-2/FLU/RSV plus assay is intended as an aid in the diagnosis of influenza from Nasopharyngeal swab specimens and should not be used as a sole basis for treatment. Nasal washings and aspirates are unacceptable for Xpert Xpress SARS-CoV-2/FLU/RSV testing.  Fact Sheet for Patients: EntrepreneurPulse.com.au  Fact Sheet for Healthcare Providers: IncredibleEmployment.be  This test is  not yet approved or  cleared by the [place] and has been authorized for detection and/or diagnosis of SARS-CoV-2 by FDA under an Emergency Use Authorization (EUA). This EUA will remain in effect (meaning this test can be used) for the duration of the COVID-19 declaration under Section 564(b)(1) of the Act, 21 U.S.C. section 360bbb-3(b)(1), unless the authorization is terminated or revoked.  Performed at [facility], [place] [address] [place], [place] [address]      Signed: Terrilee Croak  [facility] 11/30/2021, 11:55 AM

## 2021-11-30 NOTE — Progress Notes (Signed)
This nurse attempted to call report to [facility] with no success. Transport scheduled for this afternoon, will attempt again prior to patient discharge. Patient aware of pending transport/discharge and agreeable.

## 2021-11-30 NOTE — Plan of Care (Signed)

## 2021-11-30 NOTE — TOC Progression Note (Signed)
Transition of Care [facility]) - Progression Note    Patient Details  Name: Mohmmad Saleeby MRN: [id number] Date of Birth: December 12, 1940  Transition of Care [facility]) CM/SW [place], LCSW Phone Number: 11/30/2021, 12:43 PM  Clinical Narrative:    CSW left voicemails for patient's sons. CSW spoke with patient's daughter in law, [name], and made her aware of discharge to [facility] today. CSW will schedule transportation.    Expected Discharge Plan: Long Term Nursing Home Barriers to Discharge: Continued Medical Work up  Expected Discharge Plan and Services Expected Discharge Plan: [place] In-house Referral: Clinical Social Work     Living arrangements for the past 2 months: [place] Expected Discharge Date: 11/30/21                                     Social Determinants of Health (SDOH) Interventions    Readmission Risk Interventions No flowsheet data found.

## 2021-11-30 NOTE — TOC Transition Note (Signed)
Transition of Care [facility]) - CM/SW Discharge Note   Patient Details  Name: Arvil Utz MRN: [id number] Date of Birth: 19-Dec-1940  Transition of Care [facility]) CM/SW Contact:  Benard Halsted, LCSW Phone Number: 11/30/2021, 12:49 PM   Clinical Narrative:    Patient will DC to: [place] Anticipated DC date: 11/30/21 Family notified: Daughter in Sports coach, left vms for sons Transport by: [facility] 3:30pm   Per MD patient ready for DC to [facility]. RN to call report prior to discharge [phone]). RN, patient, patient's family, and facility notified of DC. Discharge Summary and FL2 sent to facility. DC packet on chart. Ambulance transport requested for patient.   CSW will sign off for now as social work intervention is no longer needed. Please consult [place] again if new needs arise.     Final next level of care: Skilled Nursing Facility Barriers to Discharge: Barriers Resolved   Patient Goals and CMS Choice Patient states their goals for this hospitalization and ongoing recovery are:: Return to [facility] Medicare.gov Compare Post Acute Care list provided to:: Patient Represenative (must comment) Choice offered to / list presented to : Adult Children  Discharge Placement   Existing PASRR number confirmed : 11/30/21          Patient chooses bed at: [facility] Patient to be transferred to facility by: [facility] Name of family member notified: Daughter in law Patient and family notified of of transfer: 11/30/21  Discharge Plan and Services In-house Referral: Clinical Social Work   Post Acute Care Choice: [place]                               Social Determinants of Health ([place]) Interventions     Readmission Risk Interventions No flowsheet data found.

## 2021-11-30 NOTE — NC FL2 (Signed)
[place] MEDICAID FL2 LEVEL OF CARE SCREENING TOOL     IDENTIFICATION  Patient Name: Clinton [name] Birthdate: 23-Apr-1941 Sex: male Admission Date (Current Location): 11/23/2021  [facility] and [place] Number:  Herbalist and Address:  [facility], [place] [address] [place], [place] [address]      Provider Number: [id number]  Attending Physician Name and Address:  Terrilee Croak, MD  Relative Name and Phone Number:       Current Level of Care: Hospital Recommended Level of Care: [place] Prior Approval Number:    Date Approved/Denied:   PASRR Number: [id number]  Discharge Plan: SNF    Current Diagnoses: Patient Active Problem List   Diagnosis Date Noted   Pressure injury of skin 11/24/2021   Severe sepsis with acute organ dysfunction ([place]) 11/23/2021   Protein-calorie malnutrition, severe 02/12/2021   Bladder mass    Acute lower UTI 02/10/2021   Hematemesis 02/10/2021   Thrombocytopenia ([place]) 01/25/2021   Acute CHF (congestive heart failure) ([place]) 01/17/2021   Hypertensive urgency 01/17/2021   Bradycardia 01/17/2021   Fall 01/17/2021   Near syncope 01/17/2021   AKI (acute kidney injury) ([place])    Rotator cuff tear arthropathy of both shoulders 01/25/2016   Degenerative arthritis of knee, bilateral 01/25/2016   Status post lumbar spinal fusion 11/29/2014   GERD (gastroesophageal reflux disease) 11/25/2014   Spinal stenosis 11/24/2014   CAD (coronary artery disease) 11/15/2014   HTN (hypertension) 11/15/2014   Hyperlipidemia 11/15/2014    Orientation RESPIRATION BLADDER Height & Weight     Self, Place  Normal Incontinent, Indwelling catheter Weight: 153 lb 7 oz (69.6 kg) Height:  5\' 10"  (177.8 cm)  BEHAVIORAL SYMPTOMS/MOOD NEUROLOGICAL BOWEL NUTRITION STATUS      Incontinent Diet (see dc summary)  AMBULATORY STATUS COMMUNICATION OF NEEDS Skin   Extensive Assist Verbally PU Stage and Appropriate Care (Stage  I on heel)                       Personal Care Assistance Level of Assistance  Bathing, Feeding, Dressing Bathing Assistance: Maximum assistance Feeding assistance: Maximum assistance Dressing Assistance: Maximum assistance     Functional Limitations Info             SPECIAL CARE FACTORS FREQUENCY                       Contractures Contractures Info: Not present    Additional Factors Info  Code Status, Allergies Code Status Info: Partial Allergies Info: NKA           Current Medications (11/30/2021):  This is the current hospital active medication list Current Facility-Administered Medications  Medication Dose Route Frequency Provider Last Rate Last Admin   0.9 %  sodium chloride infusion  250 mL Intravenous Continuous Hunsucker, Bonna Gains, MD   Stopped at 11/28/21 0523   acetaminophen (TYLENOL) 160 MG/5ML solution 650 mg  650 mg Oral Q4H PRN Hunsucker, Bonna Gains, MD   650 mg at 11/29/21 2251   Chlorhexidine Gluconate Cloth 2 % PADS 6 each  6 each Topical Q0600 Hunsucker, Bonna Gains, MD   6 each at 11/30/21 0626   feeding supplement (ENSURE ENLIVE / ENSURE PLUS) liquid 237 mL  237 mL Oral TID BM Dahal, Marlowe Aschoff, MD   237 mL at 11/30/21 1004   heparin injection 5,000 Units  5,000 Units Subcutaneous Q8H Hunsucker, Bonna Gains, MD   5,000 Units  at 11/30/21 3578   loperamide (IMODIUM) capsule 2 mg  2 mg Oral Q6H PRN Hunsucker, Bonna Gains, MD       MEDLINE mouth rinse  15 mL Mouth Rinse BID Hunsucker, Bonna Gains, MD   15 mL at 11/30/21 1004   multivitamin with minerals tablet 1 tablet  1 tablet Oral Daily Dahal, Binaya, MD   1 tablet at 11/30/21 1004   ondansetron (ZOFRAN) injection 4 mg  4 mg Intravenous Q6H PRN Hunsucker, Bonna Gains, MD       polyethylene glycol (MIRALAX / GLYCOLAX) packet 17 g  17 g Oral Daily PRN Hunsucker, Bonna Gains, MD         Discharge Medications: Please see discharge summary for a list of discharge medications.  Relevant Imaging  Results:  Relevant Lab Results:   Additional Information SSN [facility]ower Marty, Burt

## 2022-01-06 ENCOUNTER — Emergency Department (HOSPITAL_COMMUNITY): Payer: Medicare Other

## 2022-01-06 ENCOUNTER — Inpatient Hospital Stay (HOSPITAL_COMMUNITY)
Admission: EM | Admit: 2022-01-06 | Discharge: 2022-01-07 | DRG: 951 | Disposition: A | Discharge status: Hospice | Payer: Medicare Other | Source: Skilled Nursing Facility | Attending: Family Medicine | Admitting: Family Medicine

## 2022-01-06 ENCOUNTER — Other Ambulatory Visit: Payer: Self-pay

## 2022-01-06 ENCOUNTER — Encounter (HOSPITAL_COMMUNITY): Payer: Self-pay | Admitting: Emergency Medicine

## 2022-01-06 DIAGNOSIS — Z7982 Long term (current) use of aspirin: Secondary | ICD-10-CM

## 2022-01-06 DIAGNOSIS — F0390 Unspecified dementia without behavioral disturbance: Secondary | ICD-10-CM | POA: Diagnosis present

## 2022-01-06 DIAGNOSIS — Z7401 Bed confinement status: Secondary | ICD-10-CM | POA: Diagnosis not present

## 2022-01-06 DIAGNOSIS — I2510 Atherosclerotic heart disease of native coronary artery without angina pectoris: Secondary | ICD-10-CM | POA: Diagnosis present

## 2022-01-06 DIAGNOSIS — E785 Hyperlipidemia, unspecified: Secondary | ICD-10-CM | POA: Diagnosis present

## 2022-01-06 DIAGNOSIS — Z981 Arthrodesis status: Secondary | ICD-10-CM

## 2022-01-06 DIAGNOSIS — R29810 Facial weakness: Secondary | ICD-10-CM | POA: Diagnosis present

## 2022-01-06 DIAGNOSIS — Z66 Do not resuscitate: Secondary | ICD-10-CM

## 2022-01-06 DIAGNOSIS — Z79899 Other long term (current) drug therapy: Secondary | ICD-10-CM | POA: Diagnosis not present

## 2022-01-06 DIAGNOSIS — Z8249 Family history of ischemic heart disease and other diseases of the circulatory system: Secondary | ICD-10-CM | POA: Diagnosis not present

## 2022-01-06 DIAGNOSIS — J189 Pneumonia, unspecified organism: Secondary | ICD-10-CM

## 2022-01-06 DIAGNOSIS — E43 Unspecified severe protein-calorie malnutrition: Secondary | ICD-10-CM | POA: Diagnosis present

## 2022-01-06 DIAGNOSIS — N179 Acute kidney failure, unspecified: Secondary | ICD-10-CM | POA: Diagnosis present

## 2022-01-06 DIAGNOSIS — R64 Cachexia: Secondary | ICD-10-CM | POA: Diagnosis present

## 2022-01-06 DIAGNOSIS — Z87891 Personal history of nicotine dependence: Secondary | ICD-10-CM

## 2022-01-06 DIAGNOSIS — Z515 Encounter for palliative care: Principal | ICD-10-CM

## 2022-01-06 DIAGNOSIS — Z955 Presence of coronary angioplasty implant and graft: Secondary | ICD-10-CM

## 2022-01-06 DIAGNOSIS — I10 Essential (primary) hypertension: Secondary | ICD-10-CM | POA: Diagnosis present

## 2022-01-06 DIAGNOSIS — G8250 Quadriplegia, unspecified: Secondary | ICD-10-CM | POA: Diagnosis present

## 2022-01-06 DIAGNOSIS — Z6822 Body mass index (BMI) 22.0-22.9, adult: Secondary | ICD-10-CM

## 2022-01-06 DIAGNOSIS — K219 Gastro-esophageal reflux disease without esophagitis: Secondary | ICD-10-CM | POA: Diagnosis present

## 2022-01-06 DIAGNOSIS — A419 Sepsis, unspecified organism: Secondary | ICD-10-CM | POA: Diagnosis present

## 2022-01-06 DIAGNOSIS — Z20822 Contact with and (suspected) exposure to covid-19: Secondary | ICD-10-CM | POA: Diagnosis present

## 2022-01-06 DIAGNOSIS — R627 Adult failure to thrive: Secondary | ICD-10-CM | POA: Diagnosis not present

## 2022-01-06 LAB — COMPREHENSIVE METABOLIC PANEL
ALT: 8 U/L (ref 0–44)
AST: 24 U/L (ref 15–41)
Albumin: 3 g/dL — ABNORMAL LOW (ref 3.5–5.0)
Alkaline Phosphatase: 52 U/L (ref 38–126)
Anion gap: 14 (ref 5–15)
BUN: 81 mg/dL — ABNORMAL HIGH (ref 8–23)
CO2: 17 mmol/L — ABNORMAL LOW (ref 22–32)
Calcium: 9.4 mg/dL (ref 8.9–10.3)
Chloride: 109 mmol/L (ref 98–111)
Creatinine, Ser: 3.61 mg/dL — ABNORMAL HIGH (ref 0.61–1.24)
GFR, Estimated: 16 mL/min — ABNORMAL LOW (ref >60)
Glucose, Bld: 106 mg/dL — ABNORMAL HIGH (ref 70–99)
Potassium: 5.1 mmol/L (ref 3.5–5.1)
Sodium: 140 mmol/L (ref 135–145)
Total Bilirubin: 1.7 mg/dL — ABNORMAL HIGH (ref 0.3–1.2)
Total Protein: 6.7 g/dL (ref 6.5–8.1)

## 2022-01-06 LAB — URINALYSIS, ROUTINE W REFLEX MICROSCOPIC
Bacteria, UA: NONE SEEN
Bilirubin Urine: NEGATIVE
Glucose, UA: NEGATIVE mg/dL
Ketones, ur: NEGATIVE mg/dL
Nitrite: NEGATIVE
Protein, ur: 100 mg/dL — AB
Specific Gravity, Urine: 1.013 (ref 1.005–1.030)
WBC, UA: >50 WBC/hpf — ABNORMAL HIGH (ref 0–5)
pH: 9 — ABNORMAL HIGH (ref 5.0–8.0)

## 2022-01-06 LAB — BLOOD CULTURE ID PANEL (REFLEXED) - BCID2

## 2022-01-06 LAB — CBC WITH DIFFERENTIAL/PLATELET
Abs Immature Granulocytes: 0.09 10*3/uL — ABNORMAL HIGH (ref 0.00–0.07)
Basophils Absolute: 0 10*3/uL (ref 0.0–0.1)
Basophils Relative: 0 %
Eosinophils Absolute: 0 10*3/uL (ref 0.0–0.5)
Eosinophils Relative: 0 %
HCT: 39.2 % (ref 39.0–52.0)
Hemoglobin: 12.5 g/dL — ABNORMAL LOW (ref 13.0–17.0)
Immature Granulocytes: 1 %
Lymphocytes Relative: 4 %
Lymphs Abs: 0.6 10*3/uL — ABNORMAL LOW (ref 0.7–4.0)
MCH: 30.6 pg (ref 26.0–34.0)
MCHC: 31.9 g/dL (ref 30.0–36.0)
MCV: 96.1 fL (ref 80.0–100.0)
Monocytes Absolute: 0.6 10*3/uL (ref 0.1–1.0)
Monocytes Relative: 3 %
Neutro Abs: 14.9 10*3/uL — ABNORMAL HIGH (ref 1.7–7.7)
Neutrophils Relative %: 92 %
Platelets: 233 10*3/uL (ref 150–400)
RBC: 4.08 MIL/uL — ABNORMAL LOW (ref 4.22–5.81)
RDW: 16.4 % — ABNORMAL HIGH (ref 11.5–15.5)
WBC: 16.1 10*3/uL — ABNORMAL HIGH (ref 4.0–10.5)
nRBC: 0 % (ref 0.0–0.2)

## 2022-01-06 LAB — RESP PANEL BY RT-PCR (FLU A&B, COVID) ARPGX2
Influenza A by PCR: NEGATIVE
Influenza B by PCR: NEGATIVE
SARS Coronavirus 2 by RT PCR: NEGATIVE

## 2022-01-06 LAB — PROTIME-INR
INR: 1.2 (ref 0.8–1.2)
Prothrombin Time: 15.4 seconds — ABNORMAL HIGH (ref 11.4–15.2)

## 2022-01-06 LAB — APTT: aPTT: 29 seconds (ref 24–36)

## 2022-01-06 LAB — LACTIC ACID, PLASMA
Lactic Acid, Venous: 2.5 mmol/L (ref 0.5–1.9)
Lactic Acid, Venous: 3.5 mmol/L (ref 0.5–1.9)

## 2022-01-06 MED ORDER — SODIUM CHLORIDE 0.9 % IV SOLN
2.0000 g | Freq: Once | INTRAVENOUS | Status: AC
  Administered 2022-01-06: 2 g via INTRAVENOUS
  Filled 2022-01-06: qty 20

## 2022-01-06 MED ORDER — GLYCOPYRROLATE 0.2 MG/ML IJ SOLN: 0.2000 mg | INTRAMUSCULAR | Status: DC | PRN

## 2022-01-06 MED ORDER — DIPHENHYDRAMINE HCL 50 MG/ML IJ SOLN: 12.5000 mg | INTRAMUSCULAR | Status: DC | PRN

## 2022-01-06 MED ORDER — ONDANSETRON 4 MG PO TBDP: 4.0000 mg | ORAL_TABLET | Freq: Four times a day (QID) | ORAL | Status: DC | PRN

## 2022-01-06 MED ORDER — ACETAMINOPHEN 500 MG PO TABS: 1000.0000 mg | ORAL_TABLET | Freq: Once | ORAL | Status: DC

## 2022-01-06 MED ORDER — ONDANSETRON HCL 4 MG/2ML IJ SOLN: 4.0000 mg | Freq: Four times a day (QID) | INTRAMUSCULAR | Status: DC | PRN

## 2022-01-06 MED ORDER — MORPHINE 100MG IN NS 100ML (1MG/ML) PREMIX INFUSION
5.0000 mg/h | INTRAVENOUS | Status: DC
  Administered 2022-01-06: 5 mg/h via INTRAVENOUS
  Filled 2022-01-06: qty 100

## 2022-01-06 MED ORDER — HYDROMORPHONE BOLUS VIA INFUSION
1.0000 mg | INTRAVENOUS | Status: DC | PRN
  Administered 2022-01-06: 1 mg via INTRAVENOUS
  Filled 2022-01-06: qty 1

## 2022-01-06 MED ORDER — GLYCOPYRROLATE 1 MG PO TABS
1.0000 mg | ORAL_TABLET | ORAL | Status: DC | PRN
  Filled 2022-01-06: qty 1

## 2022-01-06 MED ORDER — ACETAMINOPHEN 650 MG RE SUPP
650.0000 mg | Freq: Once | RECTAL | Status: AC
  Administered 2022-01-06: 650 mg via RECTAL
  Filled 2022-01-06: qty 1

## 2022-01-06 MED ORDER — ACETAMINOPHEN 650 MG RE SUPP: 650.0000 mg | Freq: Four times a day (QID) | RECTAL | Status: DC | PRN

## 2022-01-06 MED ORDER — HALOPERIDOL LACTATE 2 MG/ML PO CONC: 0.5000 mg | ORAL | Status: DC | PRN

## 2022-01-06 MED ORDER — MORPHINE BOLUS VIA INFUSION
2.0000 mg | INTRAVENOUS | Status: DC | PRN
  Filled 2022-01-06: qty 2

## 2022-01-06 MED ORDER — HALOPERIDOL LACTATE 5 MG/ML IJ SOLN: 0.5000 mg | INTRAMUSCULAR | Status: DC | PRN

## 2022-01-06 MED ORDER — ACETAMINOPHEN 325 MG PO TABS: 650.0000 mg | ORAL_TABLET | Freq: Four times a day (QID) | ORAL | Status: DC | PRN

## 2022-01-06 MED ORDER — LACTATED RINGERS IV BOLUS
1000.0000 mL | Freq: Once | INTRAVENOUS | Status: AC
  Administered 2022-01-06: 1000 mL via INTRAVENOUS

## 2022-01-06 MED ORDER — SODIUM CHLORIDE 0.9 % IV SOLN
0.5000 mg/h | INTRAVENOUS | Status: DC
  Administered 2022-01-06: 0.5 mg/h via INTRAVENOUS
  Filled 2022-01-06: qty 5

## 2022-01-06 MED ORDER — BIOTENE DRY MOUTH MT LIQD: 15.0000 mL | OROMUCOSAL | Status: DC | PRN

## 2022-01-06 MED ORDER — LORAZEPAM 1 MG PO TABS: 1.0000 mg | ORAL_TABLET | ORAL | Status: DC | PRN

## 2022-01-06 MED ORDER — LORAZEPAM 2 MG/ML PO CONC: 1.0000 mg | ORAL | Status: DC | PRN

## 2022-01-06 MED ORDER — POLYVINYL ALCOHOL 1.4 % OP SOLN: 1.0000 [drp] | Freq: Four times a day (QID) | OPHTHALMIC | Status: DC | PRN

## 2022-01-06 MED ORDER — LORAZEPAM 2 MG/ML IJ SOLN: 1.0000 mg | INTRAMUSCULAR | Status: DC | PRN

## 2022-01-06 MED ORDER — SODIUM CHLORIDE 0.9 % IV SOLN
500.0000 mg | Freq: Once | INTRAVENOUS | Status: AC
Start: 2022-01-06 — End: 2022-01-06
  Administered 2022-01-06: 500 mg via INTRAVENOUS
  Filled 2022-01-06: qty 5

## 2022-01-06 MED ORDER — HALOPERIDOL 0.5 MG PO TABS
0.5000 mg | ORAL_TABLET | ORAL | Status: DC | PRN
  Filled 2022-01-06: qty 1

## 2022-01-06 NOTE — Plan of Care (Signed)
  Problem: Pain Managment: Goal: General experience of comfort will improve Outcome: Progressing   Problem: Safety: Goal: Ability to remain free from injury will improve Outcome: Progressing   

## 2022-01-06 NOTE — Consult Note (Signed)
Consultation Note Date: 01/06/2022   Patient Name: Clinton [name]  DOB: [birth year]  MRN: [id number]  Age / Sex: 81 y.o., male  PCP: [name], NP Referring Physician: Karmen Bongo, MD  Reason for Consultation: End of life care  HPI/Patient Profile: 81 y.o. male  with past medical history of coronary artery disease, osteoarthritis, spinal stenosis, GERD presenting to the emergency department from SNF on 01/06/2022 with altered mental status. In the ED, chest x-ray with likely left lower infiltrate. Lactate 2.5, creatinine elevated at 3.61 UA suspicious for UTI with large leukocytes. Admitted to [facility] with sepsis from pneumonia and/or UTI.  PMT asked to assist with end of life care.  Clinical Assessment and Goals of Care: I have reviewed medical records including EPIC notes, labs and imaging. End-of-life order set has been utilized by admitting physician Dr. Lorin Mercy.    I went to see patient in the ED and met at bedside with his son [name] to discuss diagnosis, prognosis, GOC, EOL wishes, disposition, and options. Patient is minimally responsive, except to pain. [name] reports he was moaning a few minutes ago. Facial grimacing noted on my assessment.   Patient is known to our service from previous hospitalization in March 2022. He has also been followed by outpatient palliative with [facility].  I re-introduced Palliative Medicine as specialized medical care for people living with serious illness. It focuses on providing relief from the symptoms and stress of a serious illness.   We discussed a brief life review of the patient. He is originally from [place], [place]. He has also lived in [place] and [place]. He relocated to [facility] after [name] moved to this area as he wanted to be closer to his grandchildren. Patient also has another son who lives in [place]. He is a widower; his wife  passed away in 03/01/20 from a stroke.   As far as functional and nutritional status, there has been progressive and significant decline since March. Patient has been non-ambulatory since then and has a chronic Foley catheter.  At the outpatient palliative visit on 11/23/21, patient was noted to have ongoing cough and general malaise, sleeping more than before, and increased weakness.  We discussed his current illness and what it means in the larger context of his ongoing co-morbidities. Discussed the vicious cycle of recurrent infection in a person who is bed-bound and has a chronic foley catheter. Reviewed that patient was hospitalized 11/23/21 - 11/30/21 with septic shock and acute respiratory failure requiring intubation. When he was extubated and more alert, he told [name] he never wanted to be intubated again.   The difference between full scope medical intervention and comfort care was considered. I confirmed with [name] that goal of care is comfort measures only. Reviewed that comfort care means allowing a natural course to occur with the goal of comfort and dignity rather than cure/prolonging life. Discussed specifics of comfort care provided in the hospital--keeping him clean and dry, no labs, no artificial hydration or feeding, no antibiotics, minimizing of medications, comfort feeds,  and medication for pain and dyspnea. Provided education on natural trajectory at EOL.   Discussed the option for transfer to a residential hospice facility for a more peaceful setting at EOL. [name] wants to discuss with his wife [name] and will make a decision on this tomorrow.   Questions and concerns were addressed. I provided [name] with my contact card and encouraged him to call with questions or concerns.    Primary decision maker: Documented HCPOA is son [name]. This document is on file in [place].    SUMMARY OF RECOMMENDATIONS   Continue full comfort measures Start dilaudid infusion at 0.5 mg/hr PRN  medications are available for symptom management at EOL Spiritual care consult PMT will follow-up tomorrow  Symptom Management:  Lorazepam (ATIVAN) prn for anxiety Haloperidol (HALDOL) prn for agitation  Glycopyrrolate (ROBINUL) for excessive secretions Ondansetron (ZOFRAN) prn for nausea Polyvinyl alcohol (LIQUIFILM TEARS) prn for dry eyes Antiseptic oral rinse (BIOTENE) prn for dry mouth  Code Status/Advance Care Planning: DNR  Prognosis:  < 2 weeks  Discharge Planning: To Be Determined      Primary Diagnoses: Present on Admission:  Sepsis due to pneumonia [facility])   I have reviewed the medical record, interviewed the patient and family, and examined the patient. The following aspects are pertinent.  Past Medical History:  Diagnosis Date   Anxiety    Arthritis    Coronary artery disease    have an appointment with Dr Johnsie Cancel 11/15/14   Erectile dysfunction    Fall 01/17/2021   Frozen shoulder    GERD (gastroesophageal reflux disease)    Guillain Barr syndrome ([place]) 2009   Following the H1 N1 flu vaccine   History of elevated lipids    Hypertension    Osteoarthritis    Spinal stenosis 11/24/2014    Family History  Problem Relation Age of Onset   Heart Problems Father    Heart disease Father    Sudden death Father      No Known Allergies Review of Systems  Unable to perform ROS: Mental status change   Physical Exam Vitals reviewed.  Constitutional:      General: He is not in acute distress.    Appearance: He is cachectic. He is ill-appearing.  Cardiovascular:     Rate and Rhythm: Tachycardia present. Rhythm irregular.  Pulmonary:     Effort: Pulmonary effort is normal.  Neurological:     Mental Status: He is lethargic.     Motor: Weakness present.     Comments: contracted  Psychiatric:        Speech: He is noncommunicative.    Vital Signs: BP 109/60    Pulse 93    Temp (!) 100.7 F (38.2 C)    Resp (!) 27    Ht 5' 10"  (1.778 m)    Wt 69.6 kg     SpO2 97%    BMI 22.02 kg/m        SpO2: SpO2: 97 % O2 Device:SpO2: 97 % O2 Flow Rate: .    Palliative Assessment/Data: PPS 10%     MDM - High due to: 1 or more chronic illnesses with severe exacerbation, progression, or side effects of treatment OR acute or chronic illness or injury that poses a threat to life or bodily function Review of prior external notes, review of test results, assessment requiring an independent historian Discussion or decision not to resuscitate or to de-escalate care because poor prognosis Parenteral controlled substances   Signed by: Donalee Citrin  Ali Mclaurin, NP   Please contact Palliative Medicine Team phone at [phone] for questions and concerns.  For individual provider: See Shea Evans

## 2022-01-06 NOTE — Progress Notes (Signed)
PHARMACY - PHYSICIAN COMMUNICATION CRITICAL VALUE ALERT - BLOOD CULTURE IDENTIFICATION (BCID)  Clinton [name] is an 81 y.o. male who presented to [facility] on 01/06/2022 with a chief complaint of AMS/sepsis.  Pt made comfort care.  Assessment:   1/2 blood cultures growing E. coli  Name of physician (or Provider) Contacted:  Dr. Tonie Griffith  Current antibiotics: None  Changes to prescribed antibiotics recommended:  None  Results for orders placed or performed during the hospital encounter of 02/10/21  Blood Culture ID Panel (Reflexed) (Collected: 02/10/2021  4:47 PM)  Result Value Ref Range   Enterococcus faecalis NOT DETECTED NOT DETECTED   Enterococcus Faecium NOT DETECTED NOT DETECTED   Listeria monocytogenes NOT DETECTED NOT DETECTED   Staphylococcus species NOT DETECTED NOT DETECTED   Staphylococcus aureus (BCID) NOT DETECTED NOT DETECTED   Staphylococcus epidermidis NOT DETECTED NOT DETECTED   Staphylococcus lugdunensis NOT DETECTED NOT DETECTED   Streptococcus species NOT DETECTED NOT DETECTED   Streptococcus agalactiae NOT DETECTED NOT DETECTED   Streptococcus pneumoniae NOT DETECTED NOT DETECTED   Streptococcus pyogenes NOT DETECTED NOT DETECTED   A.calcoaceticus-baumannii NOT DETECTED NOT DETECTED   Bacteroides fragilis NOT DETECTED NOT DETECTED   Enterobacterales DETECTED (A) NOT DETECTED   Enterobacter cloacae complex NOT DETECTED NOT DETECTED   Escherichia coli DETECTED (A) NOT DETECTED   Klebsiella aerogenes NOT DETECTED NOT DETECTED   Klebsiella oxytoca NOT DETECTED NOT DETECTED   Klebsiella pneumoniae NOT DETECTED NOT DETECTED   Proteus species NOT DETECTED NOT DETECTED   Salmonella species NOT DETECTED NOT DETECTED   Serratia marcescens NOT DETECTED NOT DETECTED   Haemophilus influenzae NOT DETECTED NOT DETECTED   Neisseria meningitidis NOT DETECTED NOT DETECTED   Pseudomonas aeruginosa NOT DETECTED NOT DETECTED   Stenotrophomonas maltophilia NOT DETECTED  NOT DETECTED   Candida albicans NOT DETECTED NOT DETECTED   Candida auris NOT DETECTED NOT DETECTED   Candida glabrata NOT DETECTED NOT DETECTED   Candida krusei NOT DETECTED NOT DETECTED   Candida parapsilosis NOT DETECTED NOT DETECTED   Candida tropicalis NOT DETECTED NOT DETECTED   Cryptococcus neoformans/gattii NOT DETECTED NOT DETECTED   CTX-M ESBL NOT DETECTED NOT DETECTED   Carbapenem resistance IMP NOT DETECTED NOT DETECTED   Carbapenem resistance KPC NOT DETECTED NOT DETECTED   Carbapenem resistance NDM NOT DETECTED NOT DETECTED   Carbapenem resist OXA 48 LIKE NOT DETECTED NOT DETECTED   Carbapenem resistance VIM NOT DETECTED NOT DETECTED    [name] 01/06/2022  9:15 PM

## 2022-01-06 NOTE — ED Notes (Signed)
Approx 90 ml of morphine wasted with RN Festus Aloe

## 2022-01-06 NOTE — Assessment & Plan Note (Signed)
-  Patient presenting with apparent sepsis, possibly related to UTI and/or PNA, possibly aspiration -After discussion by telephone with both sons (one not aware of father's baseline level of function and has not seen/talked to him in some time, the other local and more aware of circumstance), son in [place] has decided to proceed with comfort care only -Will admit to [facility] for comfort care and palliative care consult -Comfort care order set utilized -Pain control with morphine drip

## 2022-01-06 NOTE — ED Notes (Signed)
Pt moved from L sided laying position to R sided.  Sacral pad placed.

## 2022-01-06 NOTE — ED Provider Notes (Signed)
[facility] EMERGENCY DEPARTMENT Provider Note   CSN: [id number] Arrival date & time: 01/06/22  6811     History  Chief Complaint  Patient presents with   Code Sepsis    Clinton [name] is a 81 y.o. male.  81 yo M with a chief complaints of decreased responsiveness.  Found to be hard to arouse this morning.  EMS arrival found to be very weak.  Staff feels like this is different than typical for him.  Patient is unable to provide much history.  Answers no if he hurts anywhere.  Tells me has not been coughing or having fevers.  The history is provided by the EMS personnel, the patient and the nursing home.      Home Medications Prior to Admission medications   Medication Sig Start Date End Date Taking? Authorizing Provider  aspirin 81 MG chewable tablet Chew 1 tablet (81 mg total) by mouth daily. 02/06/21   Medina-Vargas, Monina C, NP  calcium carbonate (TUMS EX) 750 MG chewable tablet Chew 1 tablet (750 mg total) by mouth 3 (three) times daily. 02/06/21   Medina-Vargas, Monina C, NP  cetirizine (ZYRTEC) 10 MG tablet Take 10 mg by mouth daily.    [provider]  guaiFENesin 200 MG tablet Take 200 mg by mouth every 12 (twelve) hours.    [provider]  loperamide (IMODIUM) 2 MG capsule Take 1 capsule (2 mg total) by mouth every 6 (six) hours as needed for diarrhea or loose stools. 11/30/21   Terrilee Croak, MD  nitroGLYCERIN (NITROSTAT) 0.3 MG SL tablet Place 0.3 mg under the tongue every 5 (five) minutes as needed for chest pain.    [provider]  polyethylene glycol (MIRALAX / GLYCOLAX) 17 g packet Take 17 g by mouth daily as needed for moderate constipation. 11/30/21   Dahal, Marlowe Aschoff, MD  senna-docusate (SENOKOT-S) 8.6-50 MG tablet Take 1 tablet by mouth at bedtime. 02/21/21   Florencia Reasons, MD      Allergies    Patient has no known allergies.    Review of Systems   Review of Systems  Physical Exam Updated Vital Signs BP 109/60     Pulse 93    Temp (!) 100.7 F (38.2 C)    Resp (!) 27    Ht 5\' 10"  (1.778 m)    Wt 69.6 kg    SpO2 97%    BMI 22.02 kg/m  Physical Exam Vitals and nursing note reviewed.  Constitutional:      Appearance: He is well-developed. He is ill-appearing.     Comments: Chronically ill-appearing.  Cachectic.  Contractures in all 4 extremities.  HENT:     Head: Normocephalic and atraumatic.  Eyes:     Pupils: Pupils are equal, round, and reactive to light.  Neck:     Vascular: No JVD.  Cardiovascular:     Rate and Rhythm: Normal rate and regular rhythm.     Heart sounds: No murmur heard.   No friction rub. No gallop.  Pulmonary:     Effort: No respiratory distress.     Breath sounds: No wheezing.  Abdominal:     General: There is no distension.     Tenderness: There is no abdominal tenderness. There is no guarding or rebound.  Genitourinary:    Comments: Indwelling Foley Musculoskeletal:        General: Normal range of motion.     Cervical back: Normal range of motion and neck supple.  Skin:  Coloration: Skin is not pale.     Findings: No rash.  Neurological:     Mental Status: He is alert and oriented to person, place, and time.  Psychiatric:        Behavior: Behavior normal.    ED Results / Procedures / Treatments   Labs (all labs ordered are listed, but only abnormal results are displayed) Labs Reviewed  LACTIC ACID, PLASMA - Abnormal; Notable for the following components:      Result Value   Lactic Acid, Venous 2.5 (*)    All other components within normal limits  COMPREHENSIVE METABOLIC PANEL - Abnormal; Notable for the following components:   CO2 17 (*)    Glucose, Bld 106 (*)    BUN 81 (*)    Creatinine, Ser 3.61 (*)    Albumin 3.0 (*)    Total Bilirubin 1.7 (*)    GFR, Estimated 16 (*)    All other components within normal limits  CBC WITH DIFFERENTIAL/PLATELET - Abnormal; Notable for the following components:   WBC 16.1 (*)    RBC 4.08 (*)    Hemoglobin 12.5  (*)    RDW 16.4 (*)    Neutro Abs 14.9 (*)    Lymphs Abs 0.6 (*)    Abs Immature Granulocytes 0.09 (*)    All other components within normal limits  PROTIME-INR - Abnormal; Notable for the following components:   Prothrombin Time 15.4 (*)    All other components within normal limits  URINALYSIS, ROUTINE W REFLEX MICROSCOPIC - Abnormal; Notable for the following components:   APPearance CLOUDY (*)    pH 9.0 (*)    Hgb urine dipstick SMALL (*)    Protein, ur 100 (*)    Leukocytes,Ua LARGE (*)    WBC, UA >50 (*)    All other components within normal limits  RESP PANEL BY RT-PCR (FLU A&B, COVID) ARPGX2  CULTURE, BLOOD (ROUTINE X 2)  CULTURE, BLOOD (ROUTINE X 2)  URINE CULTURE  APTT  LACTIC ACID, PLASMA    EKG EKG Interpretation  Date/Time:  Sunday January 06 2022 09:08:46 EST Ventricular Rate:  118 PR Interval:  133 QRS Duration: 83 QT Interval:  355 QTC Calculation: 498 R Axis:   99 Text Interpretation: Sinus tachycardia Right axis deviation Borderline T abnormalities, inferior leads Borderline prolonged QT interval No significant change since last tracing Confirmed by Deno Etienne [phone]) on 01/06/2022 10:10:52 AM  Radiology CT Head Wo Contrast  Result Date: 01/06/2022 CLINICAL DATA:  81 year old male with history of altered mental status. EXAM: CT HEAD WITHOUT CONTRAST TECHNIQUE: Contiguous axial images were obtained from the base of the skull through the vertex without intravenous contrast. RADIATION DOSE REDUCTION: This exam was performed according to the departmental dose-optimization program which includes automated exposure control, adjustment of the mA and/or kV according to patient size and/or use of iterative reconstruction technique. COMPARISON:  Head CT 11/25/2021. FINDINGS: Brain: Mild cerebral atrophy. Patchy and confluent areas of decreased attenuation are noted throughout the deep and periventricular white matter of the cerebral hemispheres bilaterally, compatible  with chronic microvascular ischemic disease. No evidence of acute infarction, hemorrhage, hydrocephalus, extra-axial collection or mass lesion/mass effect. Vascular: No hyperdense vessel or unexpected calcification. Skull: Normal. Negative for fracture or focal lesion. Sinuses/Orbits: No acute finding. Other: Small left mastoid effusion, unchanged. IMPRESSION: 1. No acute intracranial abnormalities. 2. Mild cerebral atrophy with chronic microvascular ischemic changes in the cerebral white matter. 3. Small left mastoid effusion, unchanged. Electronically Signed  By: Vinnie Langton M.D.   On: 01/06/2022 10:06   DG Chest Port 1 View  Result Date: 01/06/2022 CLINICAL DATA:  Questionable sepsis EXAM: PORTABLE CHEST 1 VIEW COMPARISON:  November 23, 2021 FINDINGS: The heart size and mediastinal contours are within normal limits. Aortic atherosclerosis. Linear atelectasis in the right lung base. Hazy opacity in the left lung base. No visible pleural effusion or pneumothorax. Thoracic spondylosis. Chronic bilateral rotator cuff tears. IMPRESSION: 1. Hazy opacity in the left lung base may reflect atelectasis or infiltrate. 2. Linear atelectasis in the right lung base. Electronically Signed   By: Dahlia Bailiff M.D.   On: 01/06/2022 09:41    Procedures Procedures    Medications Ordered in ED Medications  cefTRIAXone (ROCEPHIN) 2 g in sodium chloride 0.9 % 100 mL IVPB (2 g Intravenous New Bag/Given 01/06/22 1024)  azithromycin (ZITHROMAX) 500 mg in sodium chloride 0.9 % 250 mL IVPB (500 mg Intravenous New Bag/Given 01/06/22 1025)  acetaminophen (TYLENOL) suppository 650 mg (has no administration in time range)  lactated ringers bolus 1,000 mL (1,000 mLs Intravenous New Bag/Given 01/06/22 3790)    ED Course/ Medical Decision Making/ A&P                           Medical Decision Making Amount and/or Complexity of Data Reviewed Labs: ordered. Radiology: ordered. ECG/medicine tests: ordered.  Risk OTC  drugs. Decision regarding hospitalization.   81 yo M with a chief complaints of decreased responsiveness.  This was noted by his nursing facility today.  The patient with some limited responsiveness upon arrival.  On record review the patient had a palliative care encounter last month that marked him as DNR though he was recently admitted to the hospital and discharged as a partial code suggesting that he would be willing to be intubated if needed.  Patient does confirm DNR status.  Will not answer me when I ask him if he would like to be intubated should his respiratory symptoms worsen.  We will obtain a laboratory evaluation.  Assess for infection.  Reportedly had some facial droop which looks like it was documented at his last hospitalization.  We will obtain a CT scan of the head.  Exchange his Foley.  Bolus of IV fluids.  Reassess.  Plain film of the chest viewed by me with likely left lower lobe infiltrate.  Will start on antibiotics.  Lactate 2.5.  No hypotension.  Patient continues to be able to answer my questions on repeat assessment.  Discussed with hospitalist for admission.  Patient also with AKI on blood work.  Leukocytosis.  Awaiting UA.  CRITICAL CARE Performed by: Cecilio Asper   Total critical care time: 35 minutes  Critical care time was exclusive of separately billable procedures and treating other patients.  Critical care was necessary to treat or prevent imminent or life-threatening deterioration.  Critical care was time spent personally by me on the following activities: development of treatment plan with patient and/or surrogate as well as nursing, discussions with consultants, evaluation of patient's response to treatment, examination of patient, obtaining history from patient or surrogate, ordering and performing treatments and interventions, ordering and review of laboratory studies, ordering and review of radiographic studies, pulse oximetry and re-evaluation of  patient's condition.  The patients results and plan were reviewed and discussed.   Any x-rays performed were independently reviewed by myself.   Differential diagnosis were considered with the presenting HPI.  Medications  cefTRIAXone (ROCEPHIN) 2 g in sodium chloride 0.9 % 100 mL IVPB (2 g Intravenous New Bag/Given 01/06/22 1024)  azithromycin (ZITHROMAX) 500 mg in sodium chloride 0.9 % 250 mL IVPB (500 mg Intravenous New Bag/Given 01/06/22 1025)  acetaminophen (TYLENOL) suppository 650 mg (has no administration in time range)  lactated ringers bolus 1,000 mL (1,000 mLs Intravenous New Bag/Given 01/06/22 0918)    Vitals:   01/06/22 0930 01/06/22 0945 01/06/22 1000 01/06/22 1015  BP:   111/61 109/60  Pulse: 99 95 92 93  Resp: (!) 29 (!) 31 (!) 22 (!) 27  Temp: (!) 101.3 F (38.5 C) (!) 101.1 F (38.4 C) (!) 100.9 F (38.3 C) (!) 100.7 F (38.2 C)  TempSrc:      SpO2: 97% 96% 95% 97%  Weight:      Height:        Final diagnoses:  Community acquired pneumonia of left lower lobe of lung    Admission/ observation were discussed with the admitting physician, patient and/or family and they are comfortable with the plan.         Final Clinical Impression(s) / ED Diagnoses Final diagnoses:  Community acquired pneumonia of left lower lobe of lung    Rx / DC Orders ED Discharge Orders     None         Deno Etienne, DO 01/06/22 1032

## 2022-01-06 NOTE — H&P (Signed)
History and Physical    Patient: Clinton [name] HCW:[id number] DOB: 14-Jul-1941 DOA: 01/06/2022 DOS: the patient was seen and examined on 01/06/2022 PCP: [name], NP  Patient coming from: SNF - [facility]; NOK: [place], [place], [phone]; Marcine Matar, [phone]   Chief Complaint: AMS  HPI: Clinton [name] is a 81 y.o. male with medical history significant of CAD; Guillain-Barre s/p H1N1 vaccine (2009); HLD; and HTN presenting with sepsis.  He was last hospitalized from 12/16-23 with sepsis due to complicated UTI with indwelling foley; he required intubation and mechanical ventilation.  Urine culture grew Providencia stuartii and Proteus mirabilis and he completed a course of Rocephin prior to d/c.   This AM, he was noted to have marked AMS with L-sided facial droop. The patient is contracted and responsive only to pain; he is unable to provide history.  He apparently had influenza on 1/16 and has been on Tamiflu.  I spoke with his son who lives in [place], [place]; he thinks he would want to be ventilated.  He has not seen him in months or talked to him in months.  He could understand and engage in conversation but his "brain was foggy."  He was able to walk last maybe a few months ago.  In review of last hospitalization, he had significant contractures and required total care.    I spoke with his son, [name].  He has not walked in years.  He has trouble moving his arms and legs.  He has some dementia, has some memory issues, difficulty recognizing his son at times.  Seemed "off" when he last saw him a few weeks ago, gets angry/agitated easily.  He did not want extensive measures, ok to make DNR.  He seemed interested in going to hospice and he would prefer comfort care/hospice at this time.    ER Course:   Seriously ill chronically but VSS.  Cachectic at baseline.  DNR but ok for intubation.  Sees palliative care regularly.  Usually conversant.  Quadriplegia at baseline.   CXR with PNA, has sepsis.     Review of Systems: Unable to review all systems due to lack of cooperation from patient. Past Medical History:  Diagnosis Date   Anxiety    Arthritis    Coronary artery disease    have an appointment with Dr Johnsie Cancel 11/15/14   Erectile dysfunction    Fall 01/17/2021   Frozen shoulder    GERD (gastroesophageal reflux disease)    Guillain Barr syndrome ([place]) 2009   Following the H1 N1 flu vaccine   History of elevated lipids    Hypertension    Osteoarthritis    Spinal stenosis 11/24/2014   Past Surgical History:  Procedure Laterality Date   CARDIAC SURGERY     2 stents placed    CORONARY ANGIOPLASTY     mid and proximal LAD stent 09/1999 [facility])   posterior lumbar fusion L4-5  11-24-14   Social History:  reports that he quit smoking about 47 years ago. His smoking use included cigarettes. He has a 5.00 pack-year smoking history. He has never used smokeless tobacco. He reports current alcohol use. He reports that he does not use drugs.  No Known Allergies  Family History  Problem Relation Age of Onset   Heart Problems Father    Heart disease Father    Sudden death Father     Prior to Admission medications   Medication Sig Start Date End Date Taking? Authorizing Provider  aspirin 81 MG chewable tablet Chew 1 tablet (81 mg total) by mouth daily. 02/06/21   Medina-Vargas, Monina C, NP  calcium carbonate (TUMS EX) 750 MG chewable tablet Chew 1 tablet (750 mg total) by mouth 3 (three) times daily. 02/06/21   Medina-Vargas, Monina C, NP  cetirizine (ZYRTEC) 10 MG tablet Take 10 mg by mouth daily.    [provider]  guaiFENesin 200 MG tablet Take 200 mg by mouth every 12 (twelve) hours.    [provider]  loperamide (IMODIUM) 2 MG capsule Take 1 capsule (2 mg total) by mouth every 6 (six) hours as needed for diarrhea or loose stools. 11/30/21   Terrilee Croak, MD  nitroGLYCERIN (NITROSTAT) 0.3 MG SL tablet Place 0.3 mg  under the tongue every 5 (five) minutes as needed for chest pain.    [provider]  polyethylene glycol (MIRALAX / GLYCOLAX) 17 g packet Take 17 g by mouth daily as needed for moderate constipation. 11/30/21   Dahal, Marlowe Aschoff, MD  senna-docusate (SENOKOT-S) 8.6-50 MG tablet Take 1 tablet by mouth at bedtime. 02/21/21   Florencia Reasons, MD    Physical Exam: Vitals:   01/06/22 0930 01/06/22 0945 01/06/22 1000 01/06/22 1015  BP:   111/61 109/60  Pulse: 99 95 92 93  Resp: (!) 29 (!) 31 (!) 22 (!) 27  Temp: (!) 101.3 F (38.5 C) (!) 101.1 F (38.4 C) (!) 100.9 F (38.3 C) (!) 100.7 F (38.2 C)  TempSrc:      SpO2: 97% 96% 95% 97%  Weight:      Height:       General:  Appears chronically severely ill, contractures; responsive only to pain, GCS 7 Eyes:  normal lids, eyes rolled back ENT:  grossly normal lips, moderately dry mm Neck:  no LAD, masses or thyromegaly; head contracted to left Cardiovascular:  RR with tachycardia, no m/r/g. No LE edema.  Respiratory:   Mild diffuse rhonchi.  Mildly increased respiratory effort. Abdomen:  soft, NT, ND, foley in place with very cloudy urine Skin:  no rash or induration seen on limited exam Musculoskeletal:  contracted BUE and BLE Psychiatric:  unresponsive other than to pain, GCS 7 Neurologic:  unable to effectively perform   Radiological Exams on Admission: Independently reviewed - see discussion in A/P where applicable  CT Head Wo Contrast  Result Date: 01/06/2022 CLINICAL DATA:  81 year old male with history of altered mental status. EXAM: CT HEAD WITHOUT CONTRAST TECHNIQUE: Contiguous axial images were obtained from the base of the skull through the vertex without intravenous contrast. RADIATION DOSE REDUCTION: This exam was performed according to the departmental dose-optimization program which includes automated exposure control, adjustment of the mA and/or kV according to patient size and/or use of iterative reconstruction technique.  COMPARISON:  Head CT 11/25/2021. FINDINGS: Brain: Mild cerebral atrophy. Patchy and confluent areas of decreased attenuation are noted throughout the deep and periventricular white matter of the cerebral hemispheres bilaterally, compatible with chronic microvascular ischemic disease. No evidence of acute infarction, hemorrhage, hydrocephalus, extra-axial collection or mass lesion/mass effect. Vascular: No hyperdense vessel or unexpected calcification. Skull: Normal. Negative for fracture or focal lesion. Sinuses/Orbits: No acute finding. Other: Small left mastoid effusion, unchanged. IMPRESSION: 1. No acute intracranial abnormalities. 2. Mild cerebral atrophy with chronic microvascular ischemic changes in the cerebral white matter. 3. Small left mastoid effusion, unchanged. Electronically Signed   By: Vinnie Langton M.D.   On: 01/06/2022 10:06   DG Chest Port 1 View  Result Date:  01/06/2022 CLINICAL DATA:  Questionable sepsis EXAM: PORTABLE CHEST 1 VIEW COMPARISON:  November 23, 2021 FINDINGS: The heart size and mediastinal contours are within normal limits. Aortic atherosclerosis. Linear atelectasis in the right lung base. Hazy opacity in the left lung base. No visible pleural effusion or pneumothorax. Thoracic spondylosis. Chronic bilateral rotator cuff tears. IMPRESSION: 1. Hazy opacity in the left lung base may reflect atelectasis or infiltrate. 2. Linear atelectasis in the right lung base. Electronically Signed   By: Dahlia Bailiff M.D.   On: 01/06/2022 09:41    EKG: Independently reviewed.  Sinus tachycardia with rate 118; nonspecific ST changes with NSCSLT   Labs on Admission: I have personally reviewed the available labs and imaging studies at the time of the admission.  Pertinent labs:    CO2 17 BUN 81/Creatinine 3.61/GFR 16; normal renal function on 12/23 Lactate 2.5, 3.5 WBC 16.1 Hgb 12.5 COVID/flu negative UA: small Hgb, large LE, 100 protein, no bacteria, >50  WBC   Assessment/Plan * Admission for end of life care -Patient presenting with apparent sepsis, possibly related to UTI and/or PNA, possibly aspiration -After discussion by telephone with both sons (one not aware of father's baseline level of function and has not seen/talked to him in some time, the other local and more aware of circumstance), son in [place] has decided to proceed with comfort care only -Will admit to [facility] for comfort care and palliative care consult -Comfort care order set utilized -Pain control with morphine drip    Patient Active Problem List   Diagnosis Date Noted   Sepsis due to pneumonia ([place]) 01/06/2022   Admission for end of life care 01/06/2022   Severe sepsis with acute organ dysfunction ([place]) 11/23/2021   Protein-calorie malnutrition, severe 02/12/2021   Bladder mass    AKI (acute kidney injury) ([place])    Status post lumbar spinal fusion 11/29/2014   GERD (gastroesophageal reflux disease) 11/25/2014   CAD (coronary artery disease) 11/15/2014   HTN (hypertension) 11/15/2014   Hyperlipidemia 11/15/2014      Advance Care Planning:   Code Status: DNR   Consults: Palliative care  Family Communication: I spoke with son in [place] and then son in [place] at the time of admission  Severity of Illness: The appropriate patient status for this patient is INPATIENT. Inpatient status is judged to be reasonable and necessary in order to provide the required intensity of service to ensure the patient's safety. The patient's presenting symptoms, physical exam findings, and initial radiographic and laboratory data in the context of their chronic comorbidities is felt to place them at high risk for further clinical deterioration. Furthermore, it is not anticipated that the patient will be medically stable for discharge from the hospital within 2 midnights of admission.   * I certify that at the point of admission it is my clinical judgment that the patient will  require inpatient hospital care spanning beyond 2 midnights from the point of admission due to high intensity of service, high risk for further deterioration and high frequency of surveillance required.*  Author: Karmen Bongo, MD 01/06/2022 2:07 PM  For on call review [email or web address].

## 2022-01-06 NOTE — ED Triage Notes (Signed)
Pt BIB [facility] from [facility]. Staff called out for unconsciousness. When EMS arrived patient noted to be very lethargic, limited verbal response, noted to have left sided facial droop. Per staff, patient stated this is all abnormal for him. Usually Aox4. Could not recall when the facial droop started. Pt BP: 116/70, HR 120s sinus tach, 97% on RA. Dr. Tyrone Nine evaluating at bedside.

## 2022-01-06 NOTE — ED Notes (Signed)
MD notified of elevating lactic.

## 2022-01-06 NOTE — ED Notes (Signed)
Attempted to obtain second set of blood cx. Unsuccessful.

## 2022-01-06 NOTE — ED Notes (Signed)
Updated son at bedside.

## 2022-01-07 DIAGNOSIS — R627 Adult failure to thrive: Secondary | ICD-10-CM

## 2022-01-07 NOTE — Discharge Summary (Signed)
PatientPhysician Discharge Summary  Clinton [name] XBM:[id number] DOB: Feb 28, 1941 DOA: 01/06/2022  PCP: [name], NP  Admit date: 01/06/2022 Discharge date: 01/07/2022 30 Day Unplanned Readmission Risk Score    Flowsheet Row ED to Hosp-Admission (Current) from 01/06/2022 in [place]  30 Day Unplanned Readmission Risk Score (%) 22.68 Filed at 01/07/2022 1201       This score is the patient's risk of an unplanned readmission within 30 days of being discharged (0 -100%). The score is based on dignosis, age, lab data, medications, orders, and past utilization.   Low:  0-14.9   Medium: 15-21.9   High: 22-29.9   Extreme: 30 and above          Admitted From: SNF - [place] Disposition: Hospice facility at [facility]  Recommendations for Outpatient Follow-up:  Follow up with PCP in 1-2 weeks Please obtain BMP/CBC in one week Please follow up with your PCP on the following pending results: Unresulted Labs (From admission, onward)    None         Home Health: None Equipment/Devices: None  Discharge Condition: Stable CODE STATUS: DNR Diet recommendation: As patient pleases  Subjective: Seen and examined.  Patient was not responsive to the questions.  Had agonal breathing.  Following HPI and ED course is copied from my colleague admitting hospitalist Dr. Lorin Mercy H&P. HPI: Clinton [name] is a 81 y.o. male with medical history significant of CAD; Guillain-Barre s/p H1N1 vaccine (2009); HLD; and HTN presenting with sepsis.  He was last hospitalized from 12/16-23 with sepsis due to complicated UTI with indwelling foley; he required intubation and mechanical ventilation.  Urine culture grew Providencia stuartii and Proteus mirabilis and he completed a course of Rocephin prior to d/c.   This AM, he was noted to have marked AMS with L-sided facial droop. The patient is contracted and responsive only to pain; he is unable to provide  history.  He apparently had influenza on 1/16 and has been on Tamiflu.   I spoke with his son who lives in [place], [place]; he thinks he would want to be ventilated.  He has not seen him in months or talked to him in months.  He could understand and engage in conversation but his "brain was foggy."  He was able to walk last maybe a few months ago.  In review of last hospitalization, he had significant contractures and required total care.     I spoke with his son, [name].  He has not walked in years.  He has trouble moving his arms and legs.  He has some dementia, has some memory issues, difficulty recognizing his son at times.  Seemed "off" when he last saw him a few weeks ago, gets angry/agitated easily.  He did not want extensive measures, ok to make DNR.  He seemed interested in going to hospice and he would prefer comfort care/hospice at this time.       ER Course:   Seriously ill chronically but VSS.  Cachectic at baseline.  DNR but ok for intubation.  Sees palliative care regularly.  Usually conversant.  Quadriplegia at baseline.  CXR with PNA, has sepsis.  Brief/Interim Summary: After lengthy discussion between the admitting hospitalist and patient's son who lives in town, family decided to pursue only comfort care.  Patient was admitted for that.  Eventually he was seen by palliative care.  Arrangements were made for patient to be accepted at hospice facility at  [place] after discussion between palliative care and family.  Patient remained in the hospital only overnight.  Discharge Diagnoses:  Principal Problem:   Admission for end of life care Active Problems:   HTN (hypertension)   Hyperlipidemia   AKI (acute kidney injury) ([place])   Protein-calorie malnutrition, severe   Sepsis due to pneumonia [facility])    Discharge Instructions   Allergies as of 01/07/2022   No Known Allergies      Medication List     STOP taking these medications    oseltamivir 75 MG capsule Commonly known  as: TAMIFLU       TAKE these medications    aspirin 81 MG chewable tablet Chew 1 tablet (81 mg total) by mouth daily.   calcium carbonate 750 MG chewable tablet Commonly known as: TUMS EX Chew 1 tablet (750 mg total) by mouth 3 (three) times daily.   cetirizine 10 MG tablet Commonly known as: ZYRTEC Take 10 mg by mouth daily.   guaiFENesin 200 MG tablet Take 200 mg by mouth every 12 (twelve) hours.   loperamide 2 MG capsule Commonly known as: IMODIUM Take 1 capsule (2 mg total) by mouth every 6 (six) hours as needed for diarrhea or loose stools.   nitroGLYCERIN 0.4 MG SL tablet Commonly known as: NITROSTAT Place 0.4 mg under the tongue every 5 (five) minutes x 3 doses as needed for chest pain.   ondansetron 4 MG tablet Commonly known as: ZOFRAN Take 4 mg by mouth every 6 (six) hours as needed for nausea or vomiting.   polyethylene glycol 17 g packet Commonly known as: MIRALAX / GLYCOLAX Take 17 g by mouth daily as needed for moderate constipation.   senna-docusate 8.6-50 MG tablet Commonly known as: Senokot-S Take 1 tablet by mouth at bedtime.        No Known Allergies  Consultations: Palliative care   Procedures/Studies: CT Head Wo Contrast  Result Date: 01/06/2022 CLINICAL DATA:  81 year old male with history of altered mental status. EXAM: CT HEAD WITHOUT CONTRAST TECHNIQUE: Contiguous axial images were obtained from the base of the skull through the vertex without intravenous contrast. RADIATION DOSE REDUCTION: This exam was performed according to the departmental dose-optimization program which includes automated exposure control, adjustment of the mA and/or kV according to patient size and/or use of iterative reconstruction technique. COMPARISON:  Head CT 11/25/2021. FINDINGS: Brain: Mild cerebral atrophy. Patchy and confluent areas of decreased attenuation are noted throughout the deep and periventricular white matter of the cerebral hemispheres  bilaterally, compatible with chronic microvascular ischemic disease. No evidence of acute infarction, hemorrhage, hydrocephalus, extra-axial collection or mass lesion/mass effect. Vascular: No hyperdense vessel or unexpected calcification. Skull: Normal. Negative for fracture or focal lesion. Sinuses/Orbits: No acute finding. Other: Small left mastoid effusion, unchanged. IMPRESSION: 1. No acute intracranial abnormalities. 2. Mild cerebral atrophy with chronic microvascular ischemic changes in the cerebral white matter. 3. Small left mastoid effusion, unchanged. Electronically Signed   By: Vinnie Langton M.D.   On: 01/06/2022 10:06   DG Chest Port 1 View  Result Date: 01/06/2022 CLINICAL DATA:  Questionable sepsis EXAM: PORTABLE CHEST 1 VIEW COMPARISON:  November 23, 2021 FINDINGS: The heart size and mediastinal contours are within normal limits. Aortic atherosclerosis. Linear atelectasis in the right lung base. Hazy opacity in the left lung base. No visible pleural effusion or pneumothorax. Thoracic spondylosis. Chronic bilateral rotator cuff tears. IMPRESSION: 1. Hazy opacity in the left lung base may reflect atelectasis or infiltrate. 2. Linear atelectasis  in the right lung base. Electronically Signed   By: Dahlia Bailiff M.D.   On: 01/06/2022 09:41     Discharge Exam: Vitals:   01/06/22 1826 01/07/22 0411  BP: (!) 94/54 (!) 85/46  Pulse: 76 76  Resp: 17 12  Temp: 98.1 F (36.7 C) 98.3 F (36.8 C)  SpO2: 98% 96%   Vitals:   01/06/22 1715 01/06/22 1800 01/06/22 1826 01/07/22 0411  BP:  104/69 (!) 94/54 (!) 85/46  Pulse: 80 (!) 41 76 76  Resp: (!) 24 18 17 12   Temp: 98 F (36.7 C) 97.8 F (36.6 C) 98.1 F (36.7 C) 98.3 F (36.8 C)  TempSrc:   Oral Oral  SpO2: 97% 98% 98% 96%  Weight:      Height:        General: Pt is nonresponsive but not in acute distress Cardiovascular: RRR, S1/S2 +, no rubs, no gallops Respiratory: CTA bilaterally, no wheezing, no rhonchi Abdominal: Soft,  NT, ND, bowel sounds + Extremities: no edema, no cyanosis    The results of significant diagnostics from this hospitalization (including imaging, microbiology, ancillary and laboratory) are listed below for reference.     Microbiology: Recent Results (from the past 240 hour(s))  Blood Culture (routine x 2)     Status: Abnormal (Preliminary result)   Collection Time: 01/06/22  9:07 AM   Specimen: BLOOD RIGHT WRIST  Result Value Ref Range Status   Specimen Description BLOOD RIGHT WRIST  Final   Special Requests   Final    BOTTLES DRAWN AEROBIC AND ANAEROBIC Blood Culture results may not be optimal due to an inadequate volume of blood received in culture bottles   Culture  Setup Time   Final    GRAM NEGATIVE RODS ANAEROBIC BOTTLE ONLY CRITICAL RESULT CALLED TO, READ BACK BY AND VERIFIED WITH: PHARMD GREG ABBOTT 01/06/22@21 :165 BY TW IN BOTH AEROBIC AND ANAEROBIC BOTTLES Performed at [facility], [place] [address], [place], [place] [address]    Culture PROTEUS MIRABILIS (A)  Final   Report Status PENDING  Incomplete  Blood Culture ID Panel (Reflexed)     Status: Abnormal   Collection Time: 01/06/22  9:07 AM  Result Value Ref Range Status   Enterococcus faecalis NOT DETECTED NOT DETECTED Final   Enterococcus Faecium NOT DETECTED NOT DETECTED Final   Listeria monocytogenes NOT DETECTED NOT DETECTED Final   Staphylococcus species NOT DETECTED NOT DETECTED Final   Staphylococcus aureus (BCID) NOT DETECTED NOT DETECTED Final   Staphylococcus epidermidis NOT DETECTED NOT DETECTED Final   Staphylococcus lugdunensis NOT DETECTED NOT DETECTED Final   Streptococcus species NOT DETECTED NOT DETECTED Final   Streptococcus agalactiae NOT DETECTED NOT DETECTED Final   Streptococcus pneumoniae NOT DETECTED NOT DETECTED Final   Streptococcus pyogenes NOT DETECTED NOT DETECTED Final   A.calcoaceticus-baumannii NOT DETECTED NOT DETECTED Final   Bacteroides fragilis NOT DETECTED NOT DETECTED  Final   Enterobacterales DETECTED (A) NOT DETECTED Final    Comment: Enterobacterales represent a large order of gram negative bacteria, not a single organism. CRITICAL RESULT CALLED TO, READ BACK BY AND VERIFIED WITH: PHARMD GREG ABBOTT 01/06/22@21 :15 BY TW    Enterobacter cloacae complex NOT DETECTED NOT DETECTED Final   Escherichia coli NOT DETECTED NOT DETECTED Final   Klebsiella aerogenes NOT DETECTED NOT DETECTED Final   Klebsiella oxytoca NOT DETECTED NOT DETECTED Final   Klebsiella pneumoniae NOT DETECTED NOT DETECTED Final   Proteus species DETECTED (A) NOT DETECTED Final  Comment: CRITICAL RESULT CALLED TO, READ BACK BY AND VERIFIED WITH: PHARMD GREG ABBOTT 01/06/22@21 :15 BY TW    Salmonella species NOT DETECTED NOT DETECTED Final   Serratia marcescens NOT DETECTED NOT DETECTED Final   Haemophilus influenzae NOT DETECTED NOT DETECTED Final   Neisseria meningitidis NOT DETECTED NOT DETECTED Final   Pseudomonas aeruginosa NOT DETECTED NOT DETECTED Final   Stenotrophomonas maltophilia NOT DETECTED NOT DETECTED Final   Candida albicans NOT DETECTED NOT DETECTED Final   Candida auris NOT DETECTED NOT DETECTED Final   Candida glabrata NOT DETECTED NOT DETECTED Final   Candida krusei NOT DETECTED NOT DETECTED Final   Candida parapsilosis NOT DETECTED NOT DETECTED Final   Candida tropicalis NOT DETECTED NOT DETECTED Final   Cryptococcus neoformans/gattii NOT DETECTED NOT DETECTED Final   CTX-M ESBL NOT DETECTED NOT DETECTED Final   Carbapenem resistance IMP NOT DETECTED NOT DETECTED Final   Carbapenem resistance KPC NOT DETECTED NOT DETECTED Final   Carbapenem resistance NDM NOT DETECTED NOT DETECTED Final   Carbapenem resist OXA 48 LIKE NOT DETECTED NOT DETECTED Final   Carbapenem resistance VIM NOT DETECTED NOT DETECTED Final    Comment: Performed at [facility], [address], [place], [place] [address]  Urine Culture     Status: Abnormal (Preliminary result)    Collection Time: 01/06/22  9:08 AM   Specimen: In/Out Cath Urine  Result Value Ref Range Status   Specimen Description IN/OUT CATH URINE  Final   Special Requests   Final    NONE Performed at [facility], [place] [address], [place], [place] [address]    Culture >=100,000 COLONIES/mL PROTEUS MIRABILIS (A)  Final   Report Status PENDING  Incomplete  Resp Panel by RT-PCR (Flu A&B, Covid) Nasopharyngeal Swab     Status: None   Collection Time: 01/06/22  9:08 AM   Specimen: Nasopharyngeal Swab; Nasopharyngeal(NP) swabs in vial transport medium  Result Value Ref Range Status   SARS Coronavirus 2 by RT PCR NEGATIVE NEGATIVE Final    Comment: (NOTE) SARS-CoV-2 target nucleic acids are NOT DETECTED.  The SARS-CoV-2 RNA is generally detectable in upper respiratory specimens during the acute phase of infection. The lowest concentration of SARS-CoV-2 viral copies this assay can detect is 138 copies/mL. A negative result does not preclude SARS-Cov-2 infection and should not be used as the sole basis for treatment or other patient management decisions. A negative result may occur with  improper specimen collection/handling, submission of specimen other than nasopharyngeal swab, presence of viral mutation(s) within the areas targeted by this assay, and inadequate number of viral copies(<138 copies/mL). A negative result must be combined with clinical observations, patient history, and epidemiological information. The expected result is Negative.  Fact Sheet for Patients:  EntrepreneurPulse.com.au  Fact Sheet for Healthcare Providers:  IncredibleEmployment.be  This test is no t yet approved or cleared by the [place] FDA and  has been authorized for detection and/or diagnosis of SARS-CoV-2 by FDA under an Emergency Use Authorization (EUA). This EUA will remain  in effect (meaning this test can be used) for the duration of the COVID-19 declaration  under Section 564(b)(1) of the Act, 21 U.S.C.section 360bbb-3(b)(1), unless the authorization is terminated  or revoked sooner.       Influenza A by PCR NEGATIVE NEGATIVE Final   Influenza B by PCR NEGATIVE NEGATIVE Final    Comment: (NOTE) The Xpert Xpress SARS-CoV-2/FLU/RSV plus assay is intended as an aid in the diagnosis of influenza  from Nasopharyngeal swab specimens and should not be used as a sole basis for treatment. Nasal washings and aspirates are unacceptable for Xpert Xpress SARS-CoV-2/FLU/RSV testing.  Fact Sheet for Patients: EntrepreneurPulse.com.au  Fact Sheet for Healthcare Providers: IncredibleEmployment.be  This test is not yet approved or cleared by the [place] FDA and has been authorized for detection and/or diagnosis of SARS-CoV-2 by FDA under an Emergency Use Authorization (EUA). This EUA will remain in effect (meaning this test can be used) for the duration of the COVID-19 declaration under Section 564(b)(1) of the Act, 21 U.S.C. section 360bbb-3(b)(1), unless the authorization is terminated or revoked.  Performed at [facility], [place] [address] [place], [place] [address]   Blood Culture (routine x 2)     Status: None (Preliminary result)   Collection Time: 01/06/22 11:45 AM   Specimen: BLOOD RIGHT FOREARM  Result Value Ref Range Status   Specimen Description BLOOD RIGHT FOREARM  Final   Special Requests   Final    BOTTLES DRAWN AEROBIC AND ANAEROBIC Blood Culture adequate volume   Culture   Final    NO GROWTH 1 DAY Performed at [facility], [place] [address], [place], [place] [address]    Report Status PENDING  Incomplete     Labs: BNP (last 3 results) Recent Labs    01/17/21 0856 02/10/21 2000  BNP 267.4* 270.6*   Basic Metabolic Panel: Recent Labs  Lab 01/06/22 0905  NA 140  K 5.1  CL 109  CO2 17*  GLUCOSE 106*  BUN 81*  CREATININE 3.61*  CALCIUM 9.4   Liver Function  Tests: Recent Labs  Lab 01/06/22 0905  AST 24  ALT 8  ALKPHOS 52  BILITOT 1.7*  PROT 6.7  ALBUMIN 3.0*   No results for input(s): LIPASE, AMYLASE in the last 168 hours. No results for input(s): AMMONIA in the last 168 hours. CBC: Recent Labs  Lab 01/06/22 0905  WBC 16.1*  NEUTROABS 14.9*  HGB 12.5*  HCT 39.2  MCV 96.1  PLT 233   Cardiac Enzymes: No results for input(s): CKTOTAL, CKMB, CKMBINDEX, TROPONINI in the last 168 hours. BNP: Invalid input(s): POCBNP CBG: No results for input(s): GLUCAP in the last 168 hours. D-Dimer No results for input(s): DDIMER in the last 72 hours. Hgb A1c No results for input(s): HGBA1C in the last 72 hours. Lipid Profile No results for input(s): CHOL, HDL, LDLCALC, TRIG, CHOLHDL, LDLDIRECT in the last 72 hours. Thyroid function studies No results for input(s): TSH, T4TOTAL, T3FREE, THYROIDAB in the last 72 hours.  Invalid input(s): FREET3 Anemia work up No results for input(s): VITAMINB12, FOLATE, FERRITIN, TIBC, IRON, RETICCTPCT in the last 72 hours. Urinalysis    Component Value Date/Time   COLORURINE YELLOW 01/06/2022 0900   APPEARANCEUR CLOUDY (A) 01/06/2022 0900   LABSPEC 1.013 01/06/2022 0900   PHURINE 9.0 (H) 01/06/2022 0900   GLUCOSEU NEGATIVE 01/06/2022 0900   HGBUR SMALL (A) 01/06/2022 0900   BILIRUBINUR NEGATIVE 01/06/2022 0900   BILIRUBINUR n 10/12/2015 1553   KETONESUR NEGATIVE 01/06/2022 0900   PROTEINUR 100 (A) 01/06/2022 0900   UROBILINOGEN 0.2 10/12/2015 1553   UROBILINOGEN 0.2 11/11/2014 1524   NITRITE NEGATIVE 01/06/2022 0900   LEUKOCYTESUR LARGE (A) 01/06/2022 0900   Sepsis Labs Invalid input(s): PROCALCITONIN,  WBC,  LACTICIDVEN Microbiology Recent Results (from the past 240 hour(s))  Blood Culture (routine x 2)     Status: Abnormal (Preliminary result)   Collection Time: 01/06/22  9:07 AM  Specimen: BLOOD RIGHT WRIST  Result Value Ref Range Status   Specimen Description BLOOD RIGHT WRIST  Final    Special Requests   Final    BOTTLES DRAWN AEROBIC AND ANAEROBIC Blood Culture results may not be optimal due to an inadequate volume of blood received in culture bottles   Culture  Setup Time   Final    GRAM NEGATIVE RODS ANAEROBIC BOTTLE ONLY CRITICAL RESULT CALLED TO, READ BACK BY AND VERIFIED WITH: PHARMD GREG ABBOTT 01/06/22@21 :165 BY TW IN BOTH AEROBIC AND ANAEROBIC BOTTLES Performed at [facility], [place] [address] [place], [place] [address]    Culture PROTEUS MIRABILIS (A)  Final   Report Status PENDING  Incomplete  Blood Culture ID Panel (Reflexed)     Status: Abnormal   Collection Time: 01/06/22  9:07 AM  Result Value Ref Range Status   Enterococcus faecalis NOT DETECTED NOT DETECTED Final   Enterococcus Faecium NOT DETECTED NOT DETECTED Final   Listeria monocytogenes NOT DETECTED NOT DETECTED Final   Staphylococcus species NOT DETECTED NOT DETECTED Final   Staphylococcus aureus (BCID) NOT DETECTED NOT DETECTED Final   Staphylococcus epidermidis NOT DETECTED NOT DETECTED Final   Staphylococcus lugdunensis NOT DETECTED NOT DETECTED Final   Streptococcus species NOT DETECTED NOT DETECTED Final   Streptococcus agalactiae NOT DETECTED NOT DETECTED Final   Streptococcus pneumoniae NOT DETECTED NOT DETECTED Final   Streptococcus pyogenes NOT DETECTED NOT DETECTED Final   A.calcoaceticus-baumannii NOT DETECTED NOT DETECTED Final   Bacteroides fragilis NOT DETECTED NOT DETECTED Final   Enterobacterales DETECTED (A) NOT DETECTED Final    Comment: Enterobacterales represent a large order of gram negative bacteria, not a single organism. CRITICAL RESULT CALLED TO, READ BACK BY AND VERIFIED WITH: PHARMD GREG ABBOTT 01/06/22@21 :15 BY TW    Enterobacter cloacae complex NOT DETECTED NOT DETECTED Final   Escherichia coli NOT DETECTED NOT DETECTED Final   Klebsiella aerogenes NOT DETECTED NOT DETECTED Final   Klebsiella oxytoca NOT DETECTED NOT DETECTED Final   Klebsiella  pneumoniae NOT DETECTED NOT DETECTED Final   Proteus species DETECTED (A) NOT DETECTED Final    Comment: CRITICAL RESULT CALLED TO, READ BACK BY AND VERIFIED WITH: PHARMD GREG ABBOTT 01/06/22@21 :15 BY TW    Salmonella species NOT DETECTED NOT DETECTED Final   Serratia marcescens NOT DETECTED NOT DETECTED Final   Haemophilus influenzae NOT DETECTED NOT DETECTED Final   Neisseria meningitidis NOT DETECTED NOT DETECTED Final   Pseudomonas aeruginosa NOT DETECTED NOT DETECTED Final   Stenotrophomonas maltophilia NOT DETECTED NOT DETECTED Final   Candida albicans NOT DETECTED NOT DETECTED Final   Candida auris NOT DETECTED NOT DETECTED Final   Candida glabrata NOT DETECTED NOT DETECTED Final   Candida krusei NOT DETECTED NOT DETECTED Final   Candida parapsilosis NOT DETECTED NOT DETECTED Final   Candida tropicalis NOT DETECTED NOT DETECTED Final   Cryptococcus neoformans/gattii NOT DETECTED NOT DETECTED Final   CTX-M ESBL NOT DETECTED NOT DETECTED Final   Carbapenem resistance IMP NOT DETECTED NOT DETECTED Final   Carbapenem resistance KPC NOT DETECTED NOT DETECTED Final   Carbapenem resistance NDM NOT DETECTED NOT DETECTED Final   Carbapenem resist OXA 48 LIKE NOT DETECTED NOT DETECTED Final   Carbapenem resistance VIM NOT DETECTED NOT DETECTED Final    Comment: Performed at [facility], [address], [place], [place] [address]  Urine Culture     Status: Abnormal (Preliminary result)   Collection Time: 01/06/22  9:08 AM  Specimen: In/Out Cath Urine  Result Value Ref Range Status   Specimen Description IN/OUT CATH URINE  Final   Special Requests   Final    NONE Performed at [facility], [address] [place], [place] [address]    Culture >=100,000 COLONIES/mL PROTEUS MIRABILIS (A)  Final   Report Status PENDING  Incomplete  Resp Panel by RT-PCR (Flu A&B, Covid) Nasopharyngeal Swab     Status: None   Collection Time: 01/06/22  9:08 AM   Specimen: Nasopharyngeal Swab;  Nasopharyngeal(NP) swabs in vial transport medium  Result Value Ref Range Status   SARS Coronavirus 2 by RT PCR NEGATIVE NEGATIVE Final    Comment: (NOTE) SARS-CoV-2 target nucleic acids are NOT DETECTED.  The SARS-CoV-2 RNA is generally detectable in upper respiratory specimens during the acute phase of infection. The lowest concentration of SARS-CoV-2 viral copies this assay can detect is 138 copies/mL. A negative result does not preclude SARS-Cov-2 infection and should not be used as the sole basis for treatment or other patient management decisions. A negative result may occur with  improper specimen collection/handling, submission of specimen other than nasopharyngeal swab, presence of viral mutation(s) within the areas targeted by this assay, and inadequate number of viral copies(<138 copies/mL). A negative result must be combined with clinical observations, patient history, and epidemiological information. The expected result is Negative.  Fact Sheet for Patients:  EntrepreneurPulse.com.au  Fact Sheet for Healthcare Providers:  IncredibleEmployment.be  This test is no t yet approved or cleared by the [place] FDA and  has been authorized for detection and/or diagnosis of SARS-CoV-2 by FDA under an Emergency Use Authorization (EUA). This EUA will remain  in effect (meaning this test can be used) for the duration of the COVID-19 declaration under Section 564(b)(1) of the Act, 21 U.S.C.section 360bbb-3(b)(1), unless the authorization is terminated  or revoked sooner.       Influenza A by PCR NEGATIVE NEGATIVE Final   Influenza B by PCR NEGATIVE NEGATIVE Final    Comment: (NOTE) The Xpert Xpress SARS-CoV-2/FLU/RSV plus assay is intended as an aid in the diagnosis of influenza from Nasopharyngeal swab specimens and should not be used as a sole basis for treatment. Nasal washings and aspirates are unacceptable for Xpert Xpress  SARS-CoV-2/FLU/RSV testing.  Fact Sheet for Patients: EntrepreneurPulse.com.au  Fact Sheet for Healthcare Providers: IncredibleEmployment.be  This test is not yet approved or cleared by the [place] FDA and has been authorized for detection and/or diagnosis of SARS-CoV-2 by FDA under an Emergency Use Authorization (EUA). This EUA will remain in effect (meaning this test can be used) for the duration of the COVID-19 declaration under Section 564(b)(1) of the Act, 21 U.S.C. section 360bbb-3(b)(1), unless the authorization is terminated or revoked.  Performed at [facility], [place] [address], [place], [place] [address]   Blood Culture (routine x 2)     Status: None (Preliminary result)   Collection Time: 01/06/22 11:45 AM   Specimen: BLOOD RIGHT FOREARM  Result Value Ref Range Status   Specimen Description BLOOD RIGHT FOREARM  Final   Special Requests   Final    BOTTLES DRAWN AEROBIC AND ANAEROBIC Blood Culture adequate volume   Culture   Final    NO GROWTH 1 DAY Performed at [facility], [place] [address] [place], [place] [address]    Report Status PENDING  Incomplete     Time coordinating discharge: 26 minutes  SIGNED:   Darliss Cheney, MD  [facility] 01/07/2022, 1:37 PM  If 7PM-7AM, please contact night-coverage [email or web address]

## 2022-01-07 NOTE — Progress Notes (Signed)
Approximately 62 mL of Dilaudid wasted with Court Joy RN.

## 2022-01-07 NOTE — TOC Progression Note (Addendum)
Transition of Care [facility]) - Progression Note    Patient Details  Name: Clinton [name] MRN: [id number] Date of Birth: 1941/01/02  Transition of Care [facility]) CM/SW Contact  Jacalyn Lefevre Edson Snowball, RN Phone Number: 01/07/2022, 11:33 AM  Clinical Narrative:      Received consult for residential hospice and contact person is [place].   Nash Shearer and discussed, choice offered. Family prefers [facility]. Called [facility] referral line and spoke with Cheri.    Will need signed DNR   [facility] returned call and they can accept today, consents done and ready for patient to be transported . Bedside nurse aware and called report. MD aware through secure chat. PTAR called       Expected Discharge Plan and Services                                                 Social Determinants of Health (SDOH) Interventions    Readmission Risk Interventions No flowsheet data found.

## 2022-01-07 NOTE — Progress Notes (Signed)
This chaplain responded to the PMT consult for EOL presence and prayer.   Family is not present at the time of the visit. The Pt. is not responsive to the call of his name and appears to be resting comfortable during the chaplain visit.  The chaplain sat down beside the Pt. and offered presence and  reassurance of God's love through scripture and prayer.  This chaplain is available for F/U spiritual care as needed.  Chaplain Sallyanne Kuster [phone]

## 2022-01-07 NOTE — Progress Notes (Signed)
Daily Progress Note   Patient Name: Clinton [name]       Date: 01/07/2022 DOB: Oct 27, 1941  Age: 81 y.o. MRN#: [id number] Attending Physician: Darliss Cheney, MD Primary Care Physician: [name], NP Admit Date: 01/06/2022  Reason for Consultation/Follow-up: end of life care, symptom management   HPI/Patient Profile: 81 y.o. male  with past medical history of coronary artery disease, osteoarthritis, spinal stenosis, GERD presenting to the emergency department from SNF on 01/06/2022 with altered mental status. In the ED, chest x-ray with likely left lower infiltrate. Lactate 2.5, creatinine elevated at 3.61 UA suspicious for UTI with large leukocytes. Admitted to [facility] with sepsis from pneumonia and/or UTI.  PMT asked to assist with end of life care.  Subjective: Patient appears comfortable. Dilaudid infusion is at 0.5 mg/hr. He is unresponsive to voice and light touch. No non-verbal signs of pain or discomfort noted. Respirations are even and unlabored. No excessive respiratory secretions noted.   I spoke with daughter-in-law/Alicia by phone. Education and counseling provided on natural trajectory at EOL. Emotional support provided. Provided information on residential hospice services. [name] are agreeable for patient to transfer to facility in [facility].   Additional time spent in coordination of care between attending MD, [facility] team, bedside RN, and hospice liaison.    Objective:   Physical Exam Vitals reviewed.  Constitutional:      General: He is not in acute distress.    Appearance: He is cachectic. He is ill-appearing.  Pulmonary:     Effort: Pulmonary effort is normal.  Neurological:     Mental Status: He is unresponsive.            Vital Signs: BP (!) 85/46 (BP  Location: Right Arm)    Pulse 76    Temp 98.3 F (36.8 C) (Oral)    Resp 12    Ht 5\' 10"  (1.778 m)    Wt 69.6 kg    SpO2 96%    BMI 22.02 kg/m  SpO2: SpO2: 96 % O2 Device: O2 Device: Room Air O2 Flow Rate:    LBM:   Baseline Weight: Weight: 69.6 kg Most recent weight: Weight: 69.6 kg       Palliative Assessment/Data: PPS 10%      Palliative Care Assessment & Plan   Assessment: - sepsis due  to pneumonia - AKI - protein-calorie malnutrition - bed-bound status - chronic Foley catheter - end of life care  Recommendations/Plan: DNR/DNI - gold form signed and placed on chart Continue full comfort measures  Continue dilaudid infusion Transfer to hospice facility in [facility] PRN medications are available for symptom management at EOL  Symptom Management:  Lorazepam (ATIVAN) prn for anxiety Haloperidol (HALDOL) prn for agitation  Glycopyrrolate (ROBINUL) for excessive secretions Ondansetron (ZOFRAN) prn for nausea Polyvinyl alcohol (LIQUIFILM TEARS) prn for dry eyes Antiseptic oral rinse (BIOTENE) prn for dry mouth  Prognosis:  < 2 weeks  Discharge Planning: Hospice facility    Thank you for allowing the Palliative Medicine Team to assist in the care of this patient.  MDM - High due to: 1 or more chronic illnesses with severe exacerbation, progression, or side effects of treatment OR acute or chronic illness or injury that poses a threat to life or bodily function Review of prior external notes, review of test results, assessment requiring an independent historian Decision not to resuscitate or to de-escalate care because poor prognosis Parenteral controlled substances   Lavena Bullion, NP  Please contact Palliative Medicine Team phone at [phone] for questions and concerns.

## 2022-01-08 LAB — URINE CULTURE: Culture: >=100000 — AB

## 2022-01-08 LAB — CULTURE, BLOOD (ROUTINE X 2)

## 2022-01-11 LAB — CULTURE, BLOOD (ROUTINE X 2)
Culture: NO GROWTH
Special Requests: ADEQUATE

## 2022-07-23 IMAGING — CT CT CERVICAL SPINE W/O CM
4 of 5 series · 14 of 33 positions shown, 16 images · non-contrast
Comparison: Head CT today reported separately. Cervical spine CT
04/16/2020.

CLINICAL DATA: 79-year-old male status post fall at home. Facial
trauma.

EXAM:
CT CERVICAL SPINE WITHOUT CONTRAST
TECHNIQUE: Multidetector CT imaging of the cervical spine was performed without
intravenous contrast. Multiplanar CT image reconstructions were also
generated.

[Series 7: c spine soft thins · axial · 0.44mm/px · z∈[-183,-83]mm · 4 of 168 slices shown]
[im 34/168  soft-tissue]
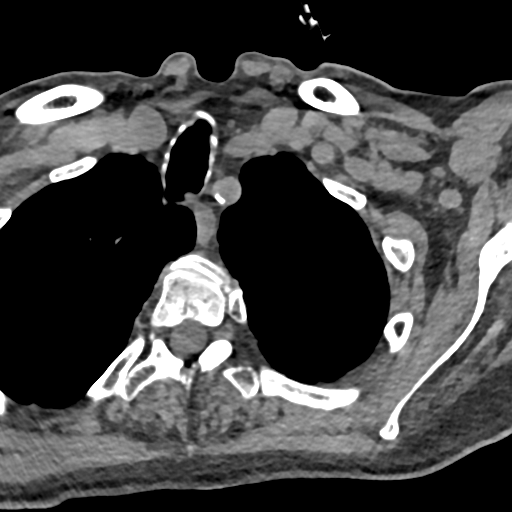
[im 67/168  soft-tissue]
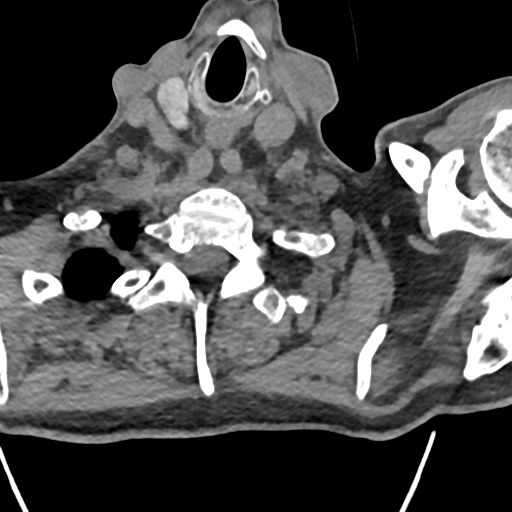
[im 101/168  soft-tissue]
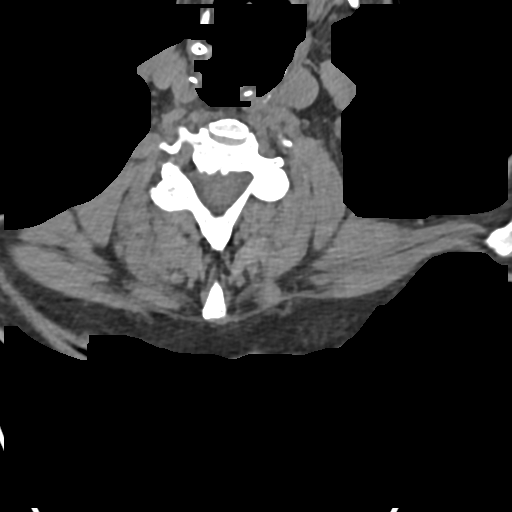
[im 134/168  soft-tissue]
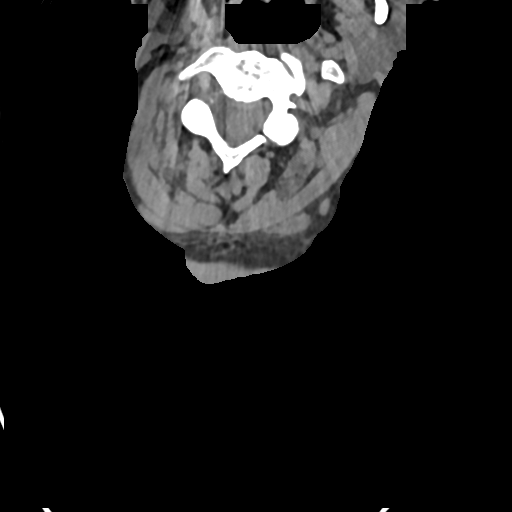

[Series 8: sag bone · sagittal · 0.36mm/px · 5 of 77 slices shown, 6 images]
[im 26/77  bone]
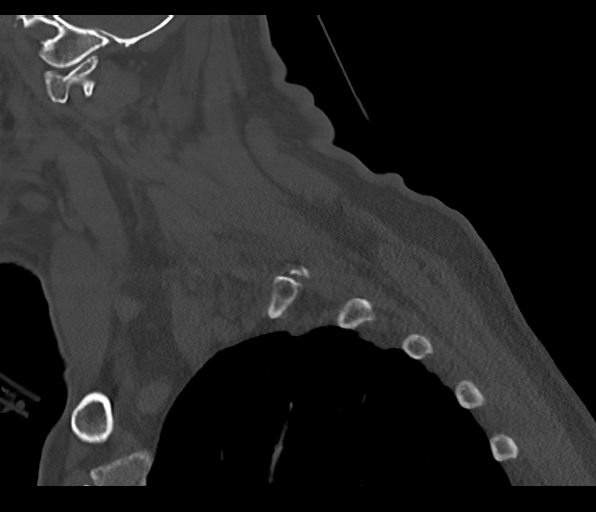
[im 32/77  bone]
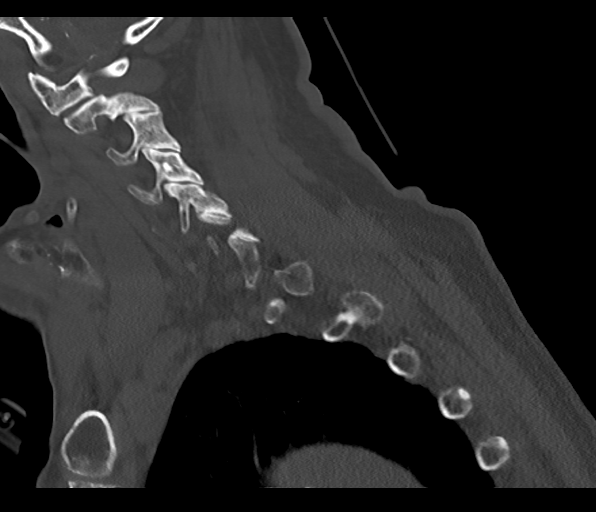
[im 39/77  soft-tissue]
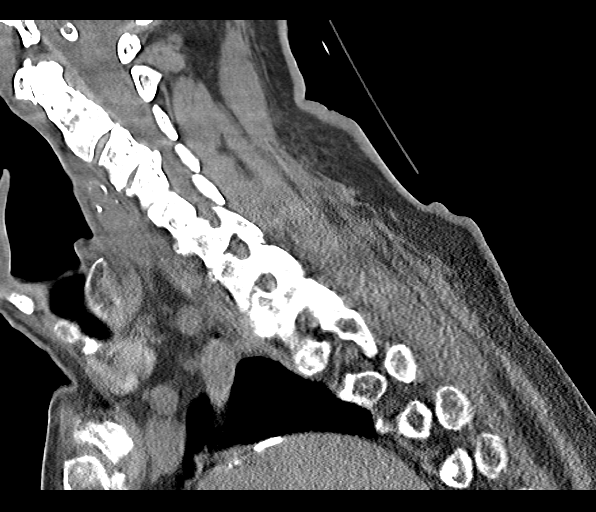
[im 39/77  bone]
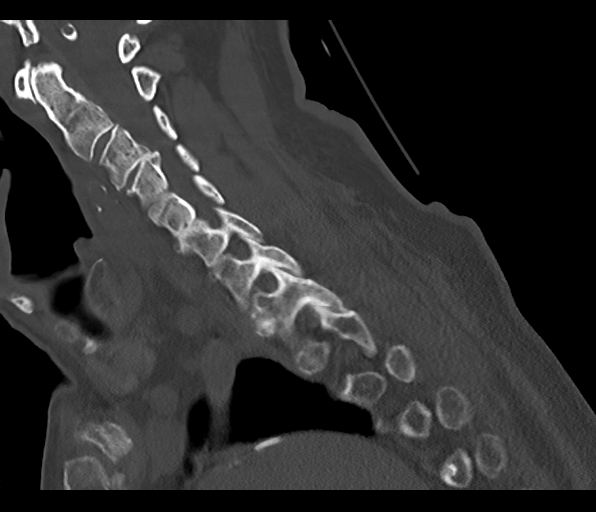
[im 45/77  bone]
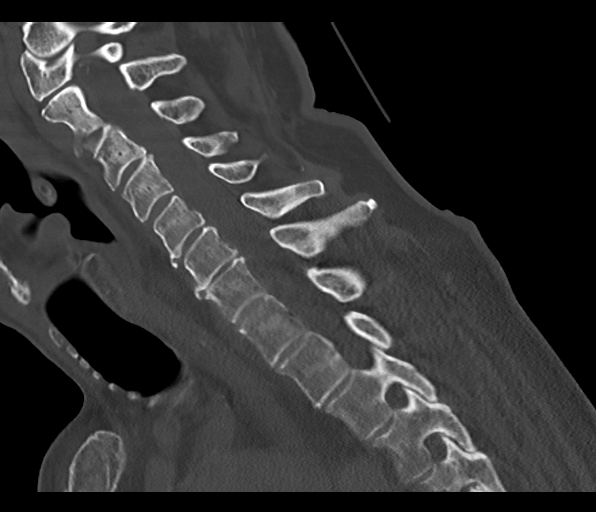
[im 51/77  bone]
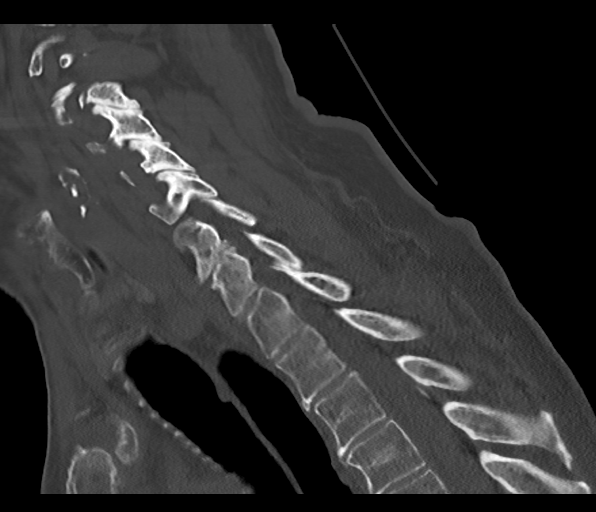

[Series 9: cor bone · coronal · 0.30mm/px · 3 of 108 slices shown]
[im 22/108  bone]
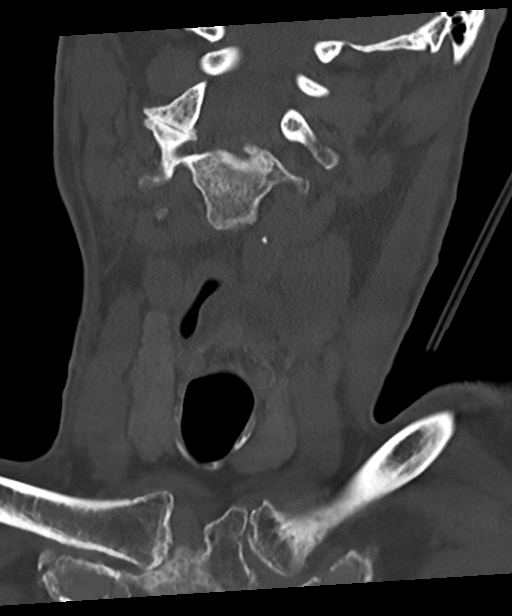
[im 43/108  bone]
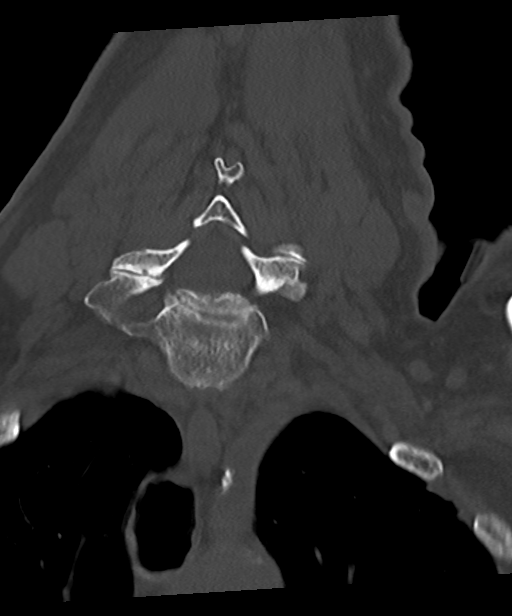
[im 65/108  bone]
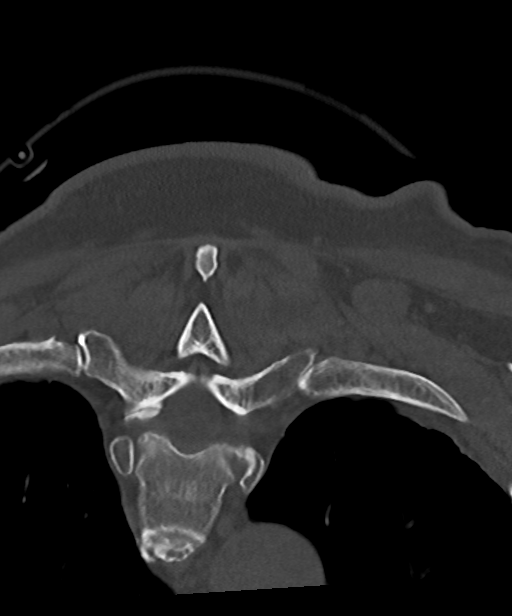

[Series 10: orthogonal axials · coronal · 0.21mm/px · 2 of 119 slices shown, 3 images]
[im 40/119  soft-tissue]
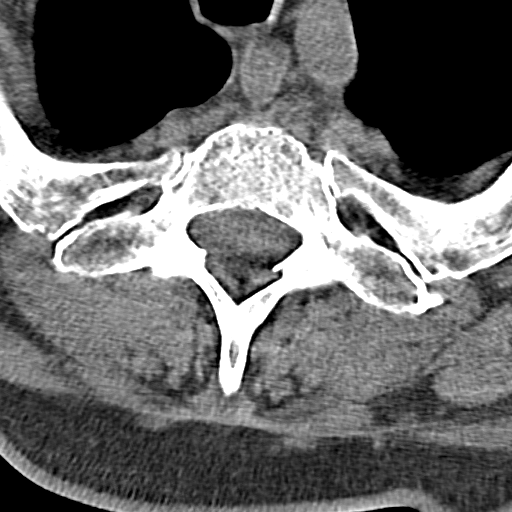
[im 40/119  bone]
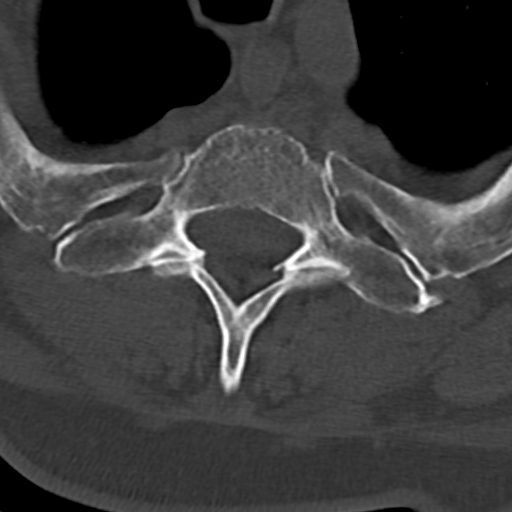
[im 79/119  bone]
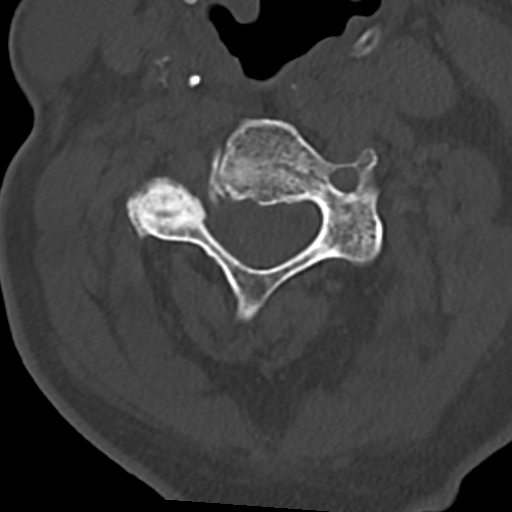

[14 of 33 positions shown; findings below may reference images not displayed]

FINDINGS: Alignment: Straightening of cervical lordosis today, less reversal
of lordosis compared to last year. Cervicothoracic junction
alignment is within normal limits. Bilateral posterior element
alignment is within normal limits.

Skull base and vertebrae: Visualized skull base is intact. No
atlanto-occipital dissociation. C1 and C2 remain normally aligned.
C2 vertebra appears stable and intact, with prominent nutrient
foramina noted at the junction of the body and pedicles (on the left
series 9, image 19).

No acute osseous abnormality identified.

Soft tissues and spinal canal: No prevertebral fluid or swelling. No
visible canal hematoma. Retropharyngeal course of the carotids in
the neck with calcified atherosclerosis.

Disc levels: Stable chronic cervical disc and endplate degeneration,
prominent right upper cervical spine facet arthropathy.

Upper chest: Visible upper thoracic levels appear grossly intact.
Negative lung apices. Calcified aortic atherosclerosis.

Other: Head CT today reported separately.
IMPRESSION: 1. No acute traumatic injury identified in the cervical spine.
2. Stable chronic cervical spine degeneration.
3. Aortic Atherosclerosis ([id number]).

## 2022-07-23 IMAGING — CT CT HEAD W/O CM
4 series · 17 of 47 positions shown, 19 images · non-contrast
Comparison: Head CT 04/16/2020.

CLINICAL DATA: 79-year-old male status post fall at home. Facial
trauma.

EXAM:
CT HEAD WITHOUT CONTRAST
TECHNIQUE: Contiguous axial images were obtained from the base of the skull
through the vertex without intravenous contrast.

[Series 3: head wo · axial · 0.45mm/px · z∈[-60,+60]mm · 7 of 33 slices shown, 9 images]
[im 5/33  brain]
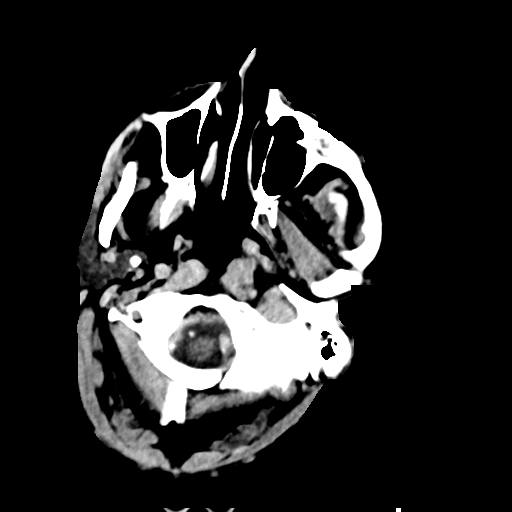
[im 5/33  bone]
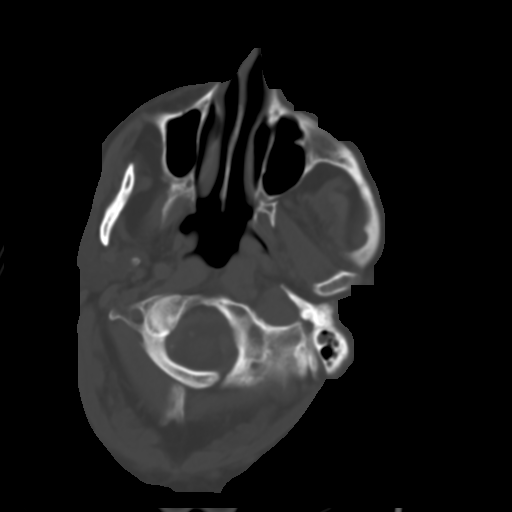
[im 9/33  brain]
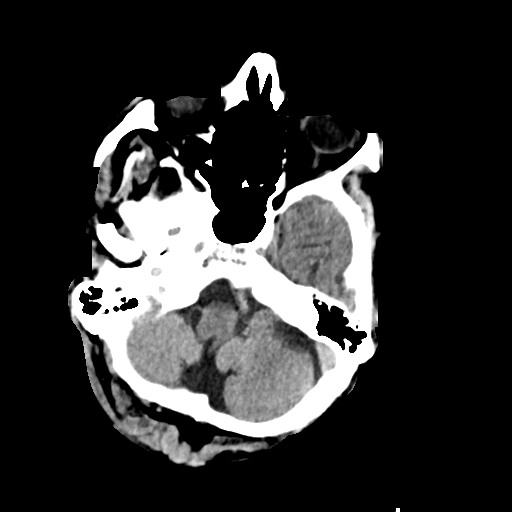
[im 13/33  brain]
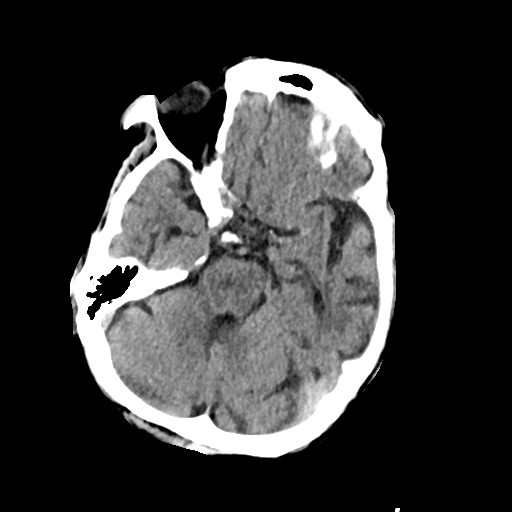
[im 17/33  brain]
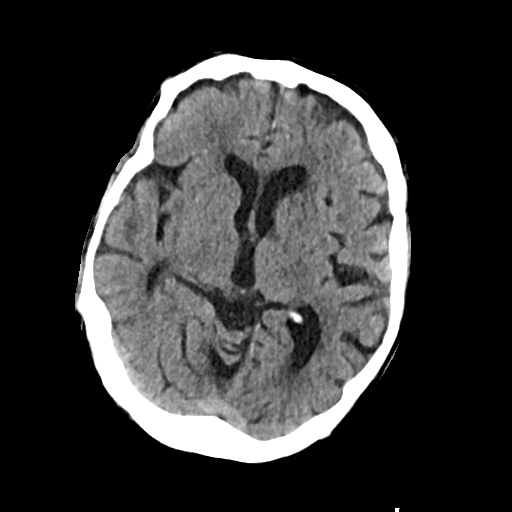
[im 21/33  brain]
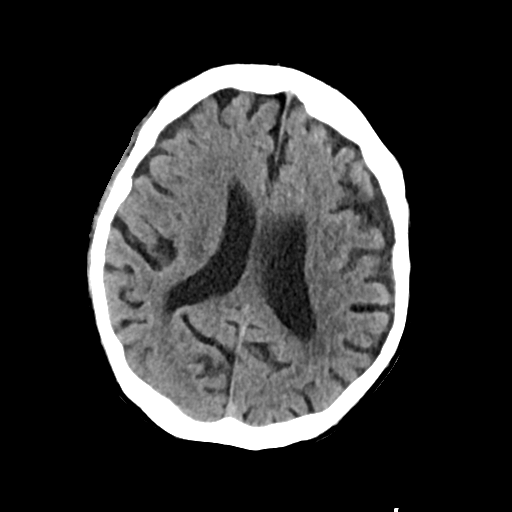
[im 21/33  bone]
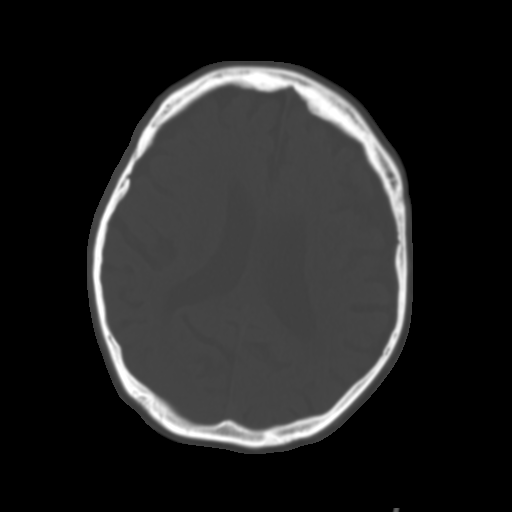
[im 25/33  brain]
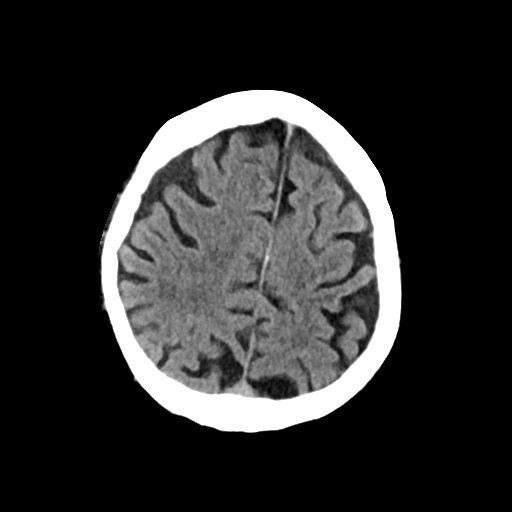
[im 29/33  brain]
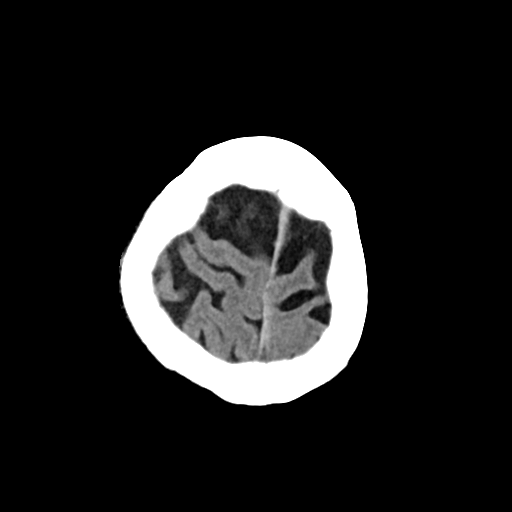

[Series 4: head bone · axial · 0.45mm/px · z∈[-64,-8]mm · 4 of 82 slices shown]
[im 9/82  bone]
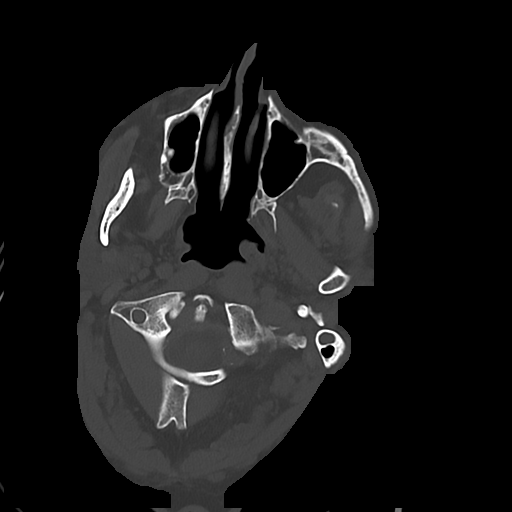
[im 17/82  bone]
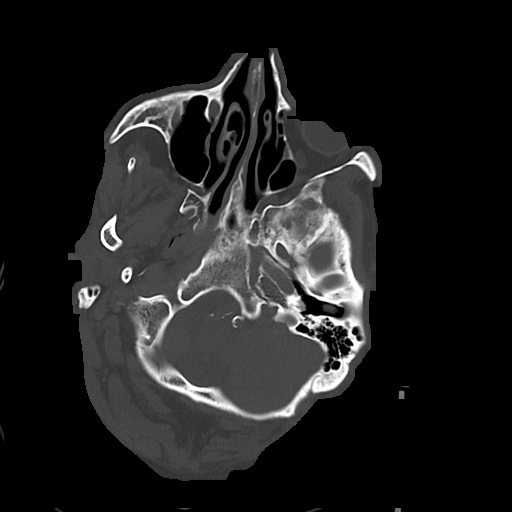
[im 25/82  bone]
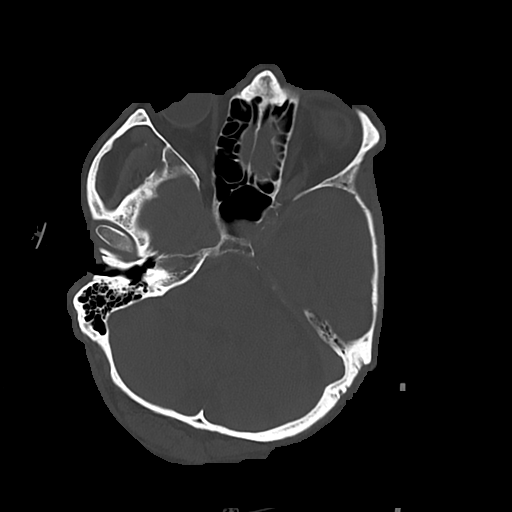
[im 37/82  bone]
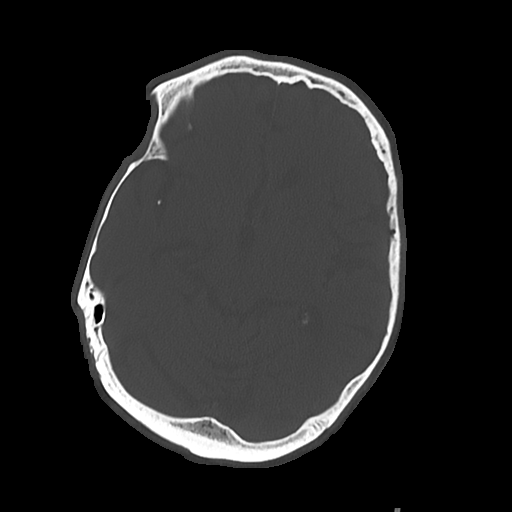

[Series 5: cor soft · coronal · 0.35mm/px · 3 of 67 slices shown]
[im 23/67  brain]
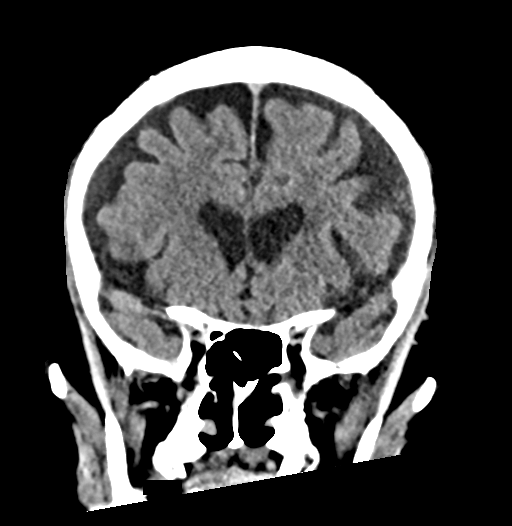
[im 30/67  brain]
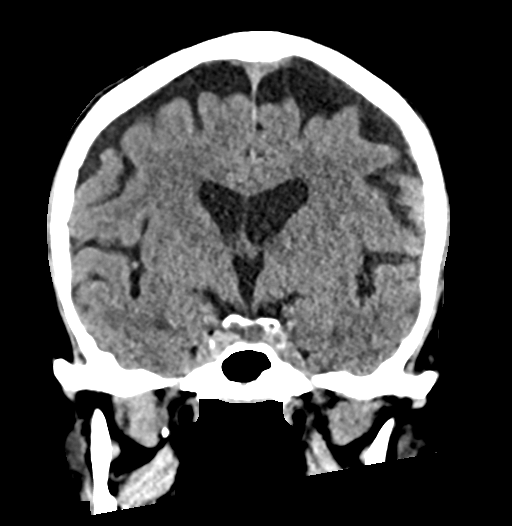
[im 37/67  brain]
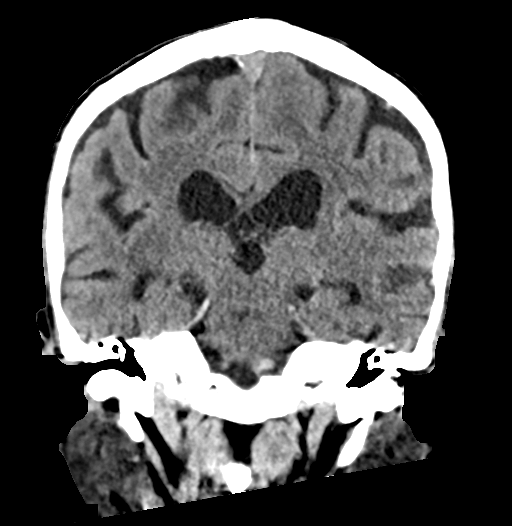

[Series 6: sag soft · sagittal · 0.36mm/px · 3 of 53 slices shown]
[im 21/53  brain]
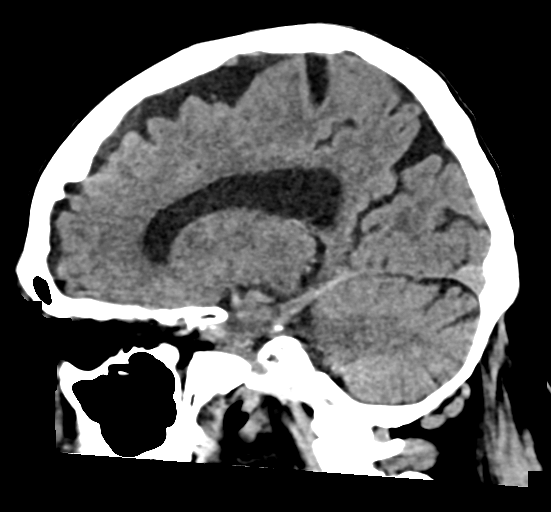
[im 27/53  brain]
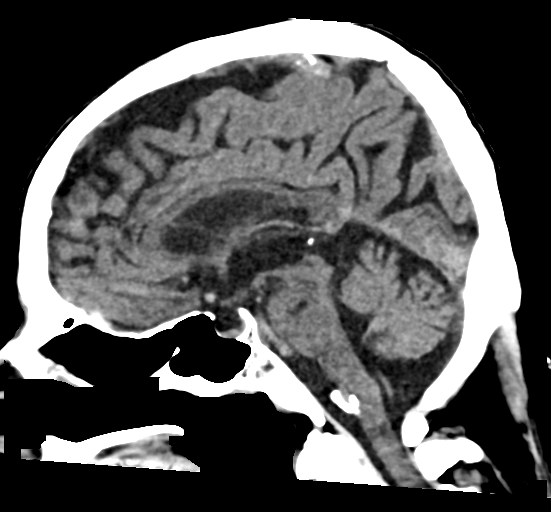
[im 33/53  brain]
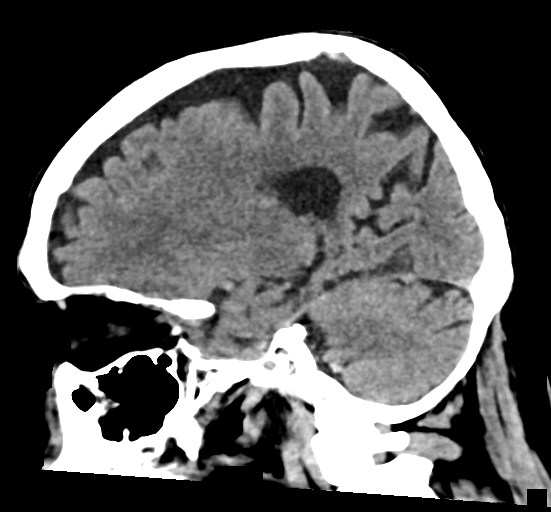

[17 of 47 positions shown; findings below may reference images not displayed]

FINDINGS: Brain: Stable cerebral volume from last year. No midline shift,
ventriculomegaly, mass effect, evidence of mass lesion, intracranial
hemorrhage or evidence of cortically based acute infarction. Stable
gray-white matter differentiation throughout the brain. Patchy left
greater than right hemisphere white matter hypodensity, with chronic
involvement of the left basal ganglia. Moderate heterogeneity in the
pons appears stable. And better demonstrated on the sagittal image a
chronic discrete pontine lacunar infarct is stable.

No cortical encephalomalacia identified.

Vascular: Extensive Calcified atherosclerosis at the skull base. No
suspicious intracranial vascular hyperdensity.

Skull: No acute osseous abnormality identified.

Sinuses/Orbits: Visualized paranasal sinuses and mastoids are stable
and well pneumatized.

Other: No acute orbit or scalp soft tissue injury identified.
Negative visible face soft tissues.
IMPRESSION: 1. No acute traumatic injury identified.
2. Chronic small vessel disease appears stable by CT from last year.
